# Patient Record
Sex: Female | Born: 1970
Health system: Southern US, Community
[De-identification: ages and names within clinical notes are randomized; demographics above are authoritative.]

## PROBLEM LIST (undated history)

## (undated) DIAGNOSIS — Z923 Personal history of irradiation: Secondary | ICD-10-CM

## (undated) DIAGNOSIS — C50919 Malignant neoplasm of unspecified site of unspecified female breast: Secondary | ICD-10-CM

## (undated) DIAGNOSIS — R002 Palpitations: Secondary | ICD-10-CM

## (undated) DIAGNOSIS — J189 Pneumonia, unspecified organism: Secondary | ICD-10-CM

## (undated) DIAGNOSIS — M797 Fibromyalgia: Secondary | ICD-10-CM

## (undated) DIAGNOSIS — R232 Flushing: Secondary | ICD-10-CM

## (undated) DIAGNOSIS — D259 Leiomyoma of uterus, unspecified: Secondary | ICD-10-CM

## (undated) DIAGNOSIS — I499 Cardiac arrhythmia, unspecified: Secondary | ICD-10-CM

## (undated) DIAGNOSIS — I471 Supraventricular tachycardia, unspecified: Secondary | ICD-10-CM

## (undated) DIAGNOSIS — A159 Respiratory tuberculosis unspecified: Secondary | ICD-10-CM

## (undated) DIAGNOSIS — F419 Anxiety disorder, unspecified: Secondary | ICD-10-CM

## (undated) DIAGNOSIS — K219 Gastro-esophageal reflux disease without esophagitis: Secondary | ICD-10-CM

## (undated) DIAGNOSIS — C50411 Malignant neoplasm of upper-outer quadrant of right female breast: Secondary | ICD-10-CM

## (undated) HISTORY — DX: Supraventricular tachycardia: I47.1

## (undated) HISTORY — PX: ABDOMINAL HYSTERECTOMY: SHX81

## (undated) HISTORY — PX: WISDOM TOOTH EXTRACTION: SHX21

## (undated) HISTORY — DX: Supraventricular tachycardia, unspecified: I47.10

## (undated) HISTORY — DX: Malignant neoplasm of upper-outer quadrant of right female breast: C50.411

## (undated) HISTORY — DX: Flushing: R23.2

## (undated) HISTORY — DX: Palpitations: R00.2

## (undated) HISTORY — DX: Anxiety disorder, unspecified: F41.9

## (undated) HISTORY — DX: Pneumonia, unspecified organism: J18.9

## (undated) HISTORY — PX: BREAST LUMPECTOMY: SHX2

## (undated) NOTE — *Deleted (*Deleted)
Patient is a 52 year old female with a history of asthma, palpitations with an implantable loop recorder, breast cancer who has been vaccinated against Covid presenting with left upper back pain.  Pain is pleuritic in nature and persistent for the last 3 days.  It does not improve with over-the-counter medications or her asthma pump.  She has had a nonproductive cough some nasal drainage but denies any fever or chills.  She has had no vomiting or diarrhea.  On exam patient is well-appearing and vital signs are reassuring.  Breath sounds are clear bilaterally however concern for possible pneumonia versus PE versus pleurisy.  Low suspicion for ACS or dissection.  Low suspicion for acute abdominal pathology.  EKG without acute changes.

---

## 1991-09-26 HISTORY — PX: TUBAL LIGATION: SHX77

## 2006-01-25 ENCOUNTER — Encounter: Admission: RE | Admit: 2006-01-25 | Discharge: 2006-01-25 | Payer: Self-pay | Admitting: Family Medicine

## 2006-07-18 DIAGNOSIS — L708 Other acne: Secondary | ICD-10-CM

## 2006-07-18 DIAGNOSIS — N951 Menopausal and female climacteric states: Secondary | ICD-10-CM | POA: Insufficient documentation

## 2006-07-18 DIAGNOSIS — K219 Gastro-esophageal reflux disease without esophagitis: Secondary | ICD-10-CM

## 2006-07-27 ENCOUNTER — Emergency Department (HOSPITAL_COMMUNITY): Admission: EM | Admit: 2006-07-27 | Discharge: 2006-07-27 | Payer: Self-pay | Admitting: Emergency Medicine

## 2006-08-21 ENCOUNTER — Encounter (INDEPENDENT_AMBULATORY_CARE_PROVIDER_SITE_OTHER): Payer: Self-pay | Admitting: Nurse Practitioner

## 2006-11-02 ENCOUNTER — Emergency Department (HOSPITAL_COMMUNITY): Admission: EM | Admit: 2006-11-02 | Discharge: 2006-11-02 | Payer: Self-pay | Admitting: Emergency Medicine

## 2007-01-25 ENCOUNTER — Emergency Department (HOSPITAL_COMMUNITY): Admission: EM | Admit: 2007-01-25 | Discharge: 2007-01-25 | Payer: Self-pay | Admitting: Emergency Medicine

## 2007-04-22 ENCOUNTER — Emergency Department (HOSPITAL_COMMUNITY): Admission: EM | Admit: 2007-04-22 | Discharge: 2007-04-22 | Payer: Self-pay | Admitting: Emergency Medicine

## 2007-05-24 ENCOUNTER — Emergency Department (HOSPITAL_COMMUNITY): Admission: EM | Admit: 2007-05-24 | Discharge: 2007-05-25 | Payer: Self-pay | Admitting: Emergency Medicine

## 2007-06-06 DIAGNOSIS — M545 Low back pain: Secondary | ICD-10-CM

## 2007-07-20 ENCOUNTER — Emergency Department (HOSPITAL_COMMUNITY): Admission: EM | Admit: 2007-07-20 | Discharge: 2007-07-20 | Payer: Self-pay | Admitting: Emergency Medicine

## 2007-07-31 DIAGNOSIS — G43909 Migraine, unspecified, not intractable, without status migrainosus: Secondary | ICD-10-CM | POA: Insufficient documentation

## 2007-09-04 ENCOUNTER — Emergency Department (HOSPITAL_COMMUNITY): Admission: EM | Admit: 2007-09-04 | Discharge: 2007-09-04 | Payer: Self-pay | Admitting: Emergency Medicine

## 2007-10-20 ENCOUNTER — Emergency Department (HOSPITAL_COMMUNITY): Admission: EM | Admit: 2007-10-20 | Discharge: 2007-10-20 | Payer: Self-pay | Admitting: Emergency Medicine

## 2007-12-28 ENCOUNTER — Emergency Department (HOSPITAL_COMMUNITY): Admission: EM | Admit: 2007-12-28 | Discharge: 2007-12-28 | Payer: Self-pay | Admitting: Emergency Medicine

## 2008-01-09 ENCOUNTER — Encounter (INDEPENDENT_AMBULATORY_CARE_PROVIDER_SITE_OTHER): Payer: Self-pay | Admitting: Nurse Practitioner

## 2008-01-21 ENCOUNTER — Encounter (INDEPENDENT_AMBULATORY_CARE_PROVIDER_SITE_OTHER): Payer: Self-pay | Admitting: Nurse Practitioner

## 2008-01-27 ENCOUNTER — Ambulatory Visit: Payer: Self-pay | Admitting: Nurse Practitioner

## 2008-01-27 DIAGNOSIS — M549 Dorsalgia, unspecified: Secondary | ICD-10-CM | POA: Insufficient documentation

## 2008-01-30 ENCOUNTER — Encounter (INDEPENDENT_AMBULATORY_CARE_PROVIDER_SITE_OTHER): Payer: Self-pay | Admitting: Family Medicine

## 2008-01-30 ENCOUNTER — Ambulatory Visit (HOSPITAL_COMMUNITY): Admission: RE | Admit: 2008-01-30 | Discharge: 2008-01-30 | Payer: Self-pay | Admitting: Nurse Practitioner

## 2008-02-04 ENCOUNTER — Encounter (INDEPENDENT_AMBULATORY_CARE_PROVIDER_SITE_OTHER): Payer: Self-pay | Admitting: Nurse Practitioner

## 2008-02-13 ENCOUNTER — Encounter (INDEPENDENT_AMBULATORY_CARE_PROVIDER_SITE_OTHER): Payer: Self-pay | Admitting: Nurse Practitioner

## 2008-02-24 ENCOUNTER — Encounter (INDEPENDENT_AMBULATORY_CARE_PROVIDER_SITE_OTHER): Payer: Self-pay | Admitting: Nurse Practitioner

## 2008-03-11 ENCOUNTER — Emergency Department (HOSPITAL_COMMUNITY): Admission: EM | Admit: 2008-03-11 | Discharge: 2008-03-11 | Payer: Self-pay | Admitting: Emergency Medicine

## 2008-03-23 ENCOUNTER — Telehealth (INDEPENDENT_AMBULATORY_CARE_PROVIDER_SITE_OTHER): Payer: Self-pay | Admitting: Nurse Practitioner

## 2008-03-30 ENCOUNTER — Other Ambulatory Visit: Admission: RE | Admit: 2008-03-30 | Discharge: 2008-03-30 | Payer: Self-pay | Admitting: Family Medicine

## 2008-03-30 ENCOUNTER — Ambulatory Visit: Payer: Self-pay | Admitting: Nurse Practitioner

## 2008-03-30 LAB — CONVERTED CEMR LAB
ALT: 17 units/L (ref 0–35)
AST: 18 units/L (ref 0–37)
Albumin: 4.5 g/dL (ref 3.5–5.2)
Alkaline Phosphatase: 64 units/L (ref 39–117)
BUN: 10 mg/dL (ref 6–23)
Basophils Absolute: 0 10*3/uL (ref 0.0–0.1)
Basophils Relative: 0 % (ref 0–1)
Chloride: 105 meq/L (ref 96–112)
Cholesterol: 184 mg/dL (ref 0–200)
Eosinophils Absolute: 0.1 10*3/uL (ref 0.0–0.7)
Glucose, Urine, Semiquant: NEGATIVE
HCT: 40.3 % (ref 36.0–46.0)
Hemoglobin: 12.8 g/dL (ref 12.0–15.0)
KOH Prep: NEGATIVE
MCV: 83.8 fL (ref 78.0–100.0)
Monocytes Absolute: 0.5 10*3/uL (ref 0.1–1.0)
Monocytes Relative: 7 % (ref 3–12)
Neutro Abs: 2.1 10*3/uL (ref 1.7–7.7)
Neutrophils Relative %: 33 % — ABNORMAL LOW (ref 43–77)
Nitrite: NEGATIVE
Potassium: 4.1 meq/L (ref 3.5–5.3)
RDW: 15 % (ref 11.5–15.5)
Sodium: 140 meq/L (ref 135–145)
Specific Gravity, Urine: >=1.03
Total Protein: 7.4 g/dL (ref 6.0–8.3)
Triglycerides: 84 mg/dL (ref <150)
WBC: 6.6 10*3/uL (ref 4.0–10.5)

## 2008-03-31 ENCOUNTER — Encounter (INDEPENDENT_AMBULATORY_CARE_PROVIDER_SITE_OTHER): Payer: Self-pay | Admitting: Nurse Practitioner

## 2008-03-31 LAB — CONVERTED CEMR LAB: Pap Smear: NEGATIVE

## 2008-04-03 ENCOUNTER — Ambulatory Visit (HOSPITAL_COMMUNITY): Admission: RE | Admit: 2008-04-03 | Discharge: 2008-04-03 | Payer: Self-pay | Admitting: Family Medicine

## 2008-04-10 ENCOUNTER — Encounter: Admission: RE | Admit: 2008-04-10 | Discharge: 2008-04-10 | Payer: Self-pay | Admitting: Family Medicine

## 2008-04-10 ENCOUNTER — Encounter (INDEPENDENT_AMBULATORY_CARE_PROVIDER_SITE_OTHER): Payer: Self-pay | Admitting: Nurse Practitioner

## 2008-04-16 ENCOUNTER — Ambulatory Visit (HOSPITAL_COMMUNITY): Admission: RE | Admit: 2008-04-16 | Discharge: 2008-04-16 | Payer: Self-pay | Admitting: Orthopaedic Surgery

## 2008-04-20 ENCOUNTER — Encounter (INDEPENDENT_AMBULATORY_CARE_PROVIDER_SITE_OTHER): Payer: Self-pay | Admitting: Nurse Practitioner

## 2008-05-07 ENCOUNTER — Encounter (INDEPENDENT_AMBULATORY_CARE_PROVIDER_SITE_OTHER): Payer: Self-pay | Admitting: Nurse Practitioner

## 2008-05-10 ENCOUNTER — Emergency Department (HOSPITAL_COMMUNITY): Admission: EM | Admit: 2008-05-10 | Discharge: 2008-05-10 | Payer: Self-pay | Admitting: Emergency Medicine

## 2008-06-03 ENCOUNTER — Telehealth (INDEPENDENT_AMBULATORY_CARE_PROVIDER_SITE_OTHER): Payer: Self-pay | Admitting: *Deleted

## 2008-06-11 ENCOUNTER — Ambulatory Visit: Payer: Self-pay | Admitting: Nurse Practitioner

## 2008-06-11 DIAGNOSIS — IMO0002 Reserved for concepts with insufficient information to code with codable children: Secondary | ICD-10-CM

## 2008-06-16 ENCOUNTER — Inpatient Hospital Stay (HOSPITAL_COMMUNITY): Admission: AD | Admit: 2008-06-16 | Discharge: 2008-06-16 | Payer: Self-pay | Admitting: Obstetrics & Gynecology

## 2008-10-12 ENCOUNTER — Telehealth (INDEPENDENT_AMBULATORY_CARE_PROVIDER_SITE_OTHER): Payer: Self-pay | Admitting: Nurse Practitioner

## 2008-10-12 ENCOUNTER — Encounter (INDEPENDENT_AMBULATORY_CARE_PROVIDER_SITE_OTHER): Payer: Self-pay | Admitting: Nurse Practitioner

## 2008-10-27 ENCOUNTER — Ambulatory Visit: Payer: Self-pay | Admitting: Nurse Practitioner

## 2008-10-27 DIAGNOSIS — H9190 Unspecified hearing loss, unspecified ear: Secondary | ICD-10-CM | POA: Insufficient documentation

## 2008-11-04 ENCOUNTER — Telehealth (INDEPENDENT_AMBULATORY_CARE_PROVIDER_SITE_OTHER): Payer: Self-pay | Admitting: Nurse Practitioner

## 2008-11-28 ENCOUNTER — Emergency Department (HOSPITAL_COMMUNITY): Admission: EM | Admit: 2008-11-28 | Discharge: 2008-11-28 | Payer: Self-pay | Admitting: Emergency Medicine

## 2008-12-09 ENCOUNTER — Encounter (INDEPENDENT_AMBULATORY_CARE_PROVIDER_SITE_OTHER): Payer: Self-pay | Admitting: Nurse Practitioner

## 2009-03-17 ENCOUNTER — Ambulatory Visit: Payer: Self-pay | Admitting: Nurse Practitioner

## 2009-03-17 DIAGNOSIS — F17200 Nicotine dependence, unspecified, uncomplicated: Secondary | ICD-10-CM

## 2009-03-26 ENCOUNTER — Encounter (INDEPENDENT_AMBULATORY_CARE_PROVIDER_SITE_OTHER): Payer: Self-pay | Admitting: Nurse Practitioner

## 2009-04-01 ENCOUNTER — Encounter: Admission: RE | Admit: 2009-04-01 | Discharge: 2009-05-18 | Payer: Self-pay | Admitting: Internal Medicine

## 2009-04-05 ENCOUNTER — Encounter (INDEPENDENT_AMBULATORY_CARE_PROVIDER_SITE_OTHER): Payer: Self-pay | Admitting: Nurse Practitioner

## 2009-04-05 ENCOUNTER — Telehealth (INDEPENDENT_AMBULATORY_CARE_PROVIDER_SITE_OTHER): Payer: Self-pay | Admitting: Nurse Practitioner

## 2009-04-09 ENCOUNTER — Ambulatory Visit: Payer: Self-pay | Admitting: Nurse Practitioner

## 2009-04-09 ENCOUNTER — Other Ambulatory Visit: Admission: RE | Admit: 2009-04-09 | Discharge: 2009-04-09 | Payer: Self-pay | Admitting: Internal Medicine

## 2009-04-09 ENCOUNTER — Encounter (INDEPENDENT_AMBULATORY_CARE_PROVIDER_SITE_OTHER): Payer: Self-pay | Admitting: Nurse Practitioner

## 2009-04-09 DIAGNOSIS — Z9189 Other specified personal risk factors, not elsewhere classified: Secondary | ICD-10-CM | POA: Insufficient documentation

## 2009-04-09 LAB — CONVERTED CEMR LAB
AST: 17 units/L (ref 0–37)
Alkaline Phosphatase: 65 units/L (ref 39–117)
Basophils Absolute: 0 10*3/uL (ref 0.0–0.1)
CO2: 23 meq/L (ref 19–32)
Calcium: 9 mg/dL (ref 8.4–10.5)
Chlamydia, DNA Probe: NEGATIVE
Chloride: 106 meq/L (ref 96–112)
Eosinophils Absolute: 0.1 10*3/uL (ref 0.0–0.7)
Eosinophils Relative: 1 % (ref 0–5)
Glucose, Bld: 64 mg/dL — ABNORMAL LOW (ref 70–99)
HCT: 37.2 % (ref 36.0–46.0)
KOH Prep: NEGATIVE
LDL Cholesterol: 88 mg/dL (ref 0–99)
MCV: 81 fL (ref 78.0–100.0)
Potassium: 4.1 meq/L (ref 3.5–5.3)
RBC: 4.59 M/uL (ref 3.87–5.11)
RDW: 15.2 % (ref 11.5–15.5)
Sodium: 139 meq/L (ref 135–145)
Total Bilirubin: 0.3 mg/dL (ref 0.3–1.2)
Total CHOL/HDL Ratio: 3.1
Total Protein: 6.8 g/dL (ref 6.0–8.3)
Triglycerides: 76 mg/dL (ref <150)
Urobilinogen, UA: 0.2
WBC Urine, dipstick: NEGATIVE
WBC: 6.5 10*3/uL (ref 4.0–10.5)
pH: 7

## 2009-04-14 ENCOUNTER — Ambulatory Visit (HOSPITAL_COMMUNITY): Admission: RE | Admit: 2009-04-14 | Discharge: 2009-04-14 | Payer: Self-pay | Admitting: Internal Medicine

## 2009-04-19 ENCOUNTER — Encounter (INDEPENDENT_AMBULATORY_CARE_PROVIDER_SITE_OTHER): Payer: Self-pay | Admitting: Nurse Practitioner

## 2009-05-10 ENCOUNTER — Ambulatory Visit: Payer: Self-pay | Admitting: Family Medicine

## 2009-05-10 DIAGNOSIS — L738 Other specified follicular disorders: Secondary | ICD-10-CM

## 2009-05-19 ENCOUNTER — Encounter (INDEPENDENT_AMBULATORY_CARE_PROVIDER_SITE_OTHER): Payer: Self-pay | Admitting: Nurse Practitioner

## 2009-05-27 ENCOUNTER — Telehealth (INDEPENDENT_AMBULATORY_CARE_PROVIDER_SITE_OTHER): Payer: Self-pay | Admitting: Nurse Practitioner

## 2009-06-08 ENCOUNTER — Emergency Department (HOSPITAL_COMMUNITY): Admission: EM | Admit: 2009-06-08 | Discharge: 2009-06-08 | Payer: Self-pay | Admitting: Emergency Medicine

## 2009-06-10 ENCOUNTER — Ambulatory Visit: Payer: Self-pay | Admitting: Nurse Practitioner

## 2009-07-15 ENCOUNTER — Emergency Department (HOSPITAL_COMMUNITY): Admission: EM | Admit: 2009-07-15 | Discharge: 2009-07-15 | Payer: Self-pay | Admitting: Emergency Medicine

## 2009-07-21 ENCOUNTER — Ambulatory Visit: Payer: Self-pay | Admitting: Cardiothoracic Surgery

## 2009-07-21 ENCOUNTER — Ambulatory Visit: Payer: Self-pay | Admitting: Infectious Diseases

## 2009-07-21 ENCOUNTER — Inpatient Hospital Stay (HOSPITAL_COMMUNITY): Admission: EM | Admit: 2009-07-21 | Discharge: 2009-07-23 | Payer: Self-pay | Admitting: Emergency Medicine

## 2009-07-21 ENCOUNTER — Encounter (INDEPENDENT_AMBULATORY_CARE_PROVIDER_SITE_OTHER): Payer: Self-pay | Admitting: Internal Medicine

## 2009-07-21 DIAGNOSIS — J13 Pneumonia due to Streptococcus pneumoniae: Secondary | ICD-10-CM | POA: Insufficient documentation

## 2009-07-22 ENCOUNTER — Encounter: Payer: Self-pay | Admitting: Cardiothoracic Surgery

## 2009-08-11 ENCOUNTER — Ambulatory Visit: Payer: Self-pay | Admitting: Nurse Practitioner

## 2009-08-12 ENCOUNTER — Ambulatory Visit (HOSPITAL_COMMUNITY): Admission: RE | Admit: 2009-08-12 | Discharge: 2009-08-12 | Payer: Self-pay | Admitting: Nurse Practitioner

## 2009-08-26 ENCOUNTER — Ambulatory Visit: Payer: Self-pay | Admitting: Infectious Diseases

## 2009-08-26 DIAGNOSIS — R300 Dysuria: Secondary | ICD-10-CM

## 2009-08-26 LAB — CONVERTED CEMR LAB
Bilirubin Urine: NEGATIVE
GC Probe Amp, Urine: NEGATIVE
Protein, ur: NEGATIVE mg/dL
Urobilinogen, UA: 0.2 (ref 0.0–1.0)
pH: 8 (ref 5.0–8.0)

## 2009-09-22 ENCOUNTER — Emergency Department (HOSPITAL_COMMUNITY): Admission: EM | Admit: 2009-09-22 | Discharge: 2009-09-22 | Payer: Self-pay | Admitting: Emergency Medicine

## 2009-10-12 ENCOUNTER — Ambulatory Visit: Payer: Self-pay | Admitting: Nurse Practitioner

## 2009-10-12 DIAGNOSIS — R079 Chest pain, unspecified: Secondary | ICD-10-CM

## 2009-10-12 DIAGNOSIS — R002 Palpitations: Secondary | ICD-10-CM | POA: Insufficient documentation

## 2009-10-12 LAB — CONVERTED CEMR LAB
Bilirubin Urine: NEGATIVE
Specific Gravity, Urine: 1.025
pH: 5.5

## 2009-10-13 ENCOUNTER — Encounter (INDEPENDENT_AMBULATORY_CARE_PROVIDER_SITE_OTHER): Payer: Self-pay | Admitting: Nurse Practitioner

## 2009-10-29 ENCOUNTER — Ambulatory Visit: Payer: Self-pay | Admitting: Nurse Practitioner

## 2009-11-04 ENCOUNTER — Ambulatory Visit (HOSPITAL_COMMUNITY): Admission: RE | Admit: 2009-11-04 | Discharge: 2009-11-04 | Payer: Self-pay | Admitting: Family Medicine

## 2009-11-08 ENCOUNTER — Telehealth (INDEPENDENT_AMBULATORY_CARE_PROVIDER_SITE_OTHER): Payer: Self-pay | Admitting: Nurse Practitioner

## 2009-11-12 ENCOUNTER — Encounter (INDEPENDENT_AMBULATORY_CARE_PROVIDER_SITE_OTHER): Payer: Self-pay | Admitting: Nurse Practitioner

## 2009-11-17 ENCOUNTER — Ambulatory Visit: Payer: Self-pay | Admitting: Nurse Practitioner

## 2009-11-17 DIAGNOSIS — F4322 Adjustment disorder with anxiety: Secondary | ICD-10-CM

## 2009-11-17 LAB — CONVERTED CEMR LAB
Bilirubin Urine: NEGATIVE
Blood in Urine, dipstick: NEGATIVE
Glucose, Urine, Semiquant: NEGATIVE
Protein, U semiquant: 30
WBC Urine, dipstick: NEGATIVE
pH: 6.5

## 2009-11-18 ENCOUNTER — Encounter (INDEPENDENT_AMBULATORY_CARE_PROVIDER_SITE_OTHER): Payer: Self-pay | Admitting: Nurse Practitioner

## 2009-12-15 ENCOUNTER — Ambulatory Visit: Payer: Self-pay | Admitting: Nurse Practitioner

## 2009-12-15 DIAGNOSIS — L299 Pruritus, unspecified: Secondary | ICD-10-CM | POA: Insufficient documentation

## 2010-01-11 ENCOUNTER — Emergency Department (HOSPITAL_COMMUNITY): Admission: EM | Admit: 2010-01-11 | Discharge: 2010-01-11 | Payer: Self-pay | Admitting: Emergency Medicine

## 2010-02-23 ENCOUNTER — Emergency Department (HOSPITAL_COMMUNITY): Admission: EM | Admit: 2010-02-23 | Discharge: 2010-02-23 | Payer: Self-pay | Admitting: Emergency Medicine

## 2010-03-21 ENCOUNTER — Ambulatory Visit: Payer: Self-pay | Admitting: Nurse Practitioner

## 2010-04-29 ENCOUNTER — Encounter (INDEPENDENT_AMBULATORY_CARE_PROVIDER_SITE_OTHER): Payer: Self-pay | Admitting: Nurse Practitioner

## 2010-05-02 ENCOUNTER — Telehealth (INDEPENDENT_AMBULATORY_CARE_PROVIDER_SITE_OTHER): Payer: Self-pay | Admitting: Nurse Practitioner

## 2010-05-05 ENCOUNTER — Ambulatory Visit: Payer: Self-pay | Admitting: Nurse Practitioner

## 2010-07-04 ENCOUNTER — Telehealth (INDEPENDENT_AMBULATORY_CARE_PROVIDER_SITE_OTHER): Payer: Self-pay | Admitting: Nurse Practitioner

## 2010-07-04 ENCOUNTER — Ambulatory Visit: Payer: Self-pay | Admitting: Nurse Practitioner

## 2010-07-04 DIAGNOSIS — L659 Nonscarring hair loss, unspecified: Secondary | ICD-10-CM | POA: Insufficient documentation

## 2010-07-04 LAB — CONVERTED CEMR LAB
Albumin: 4.6 g/dL (ref 3.5–5.2)
Anti Nuclear Antibody(ANA): NEGATIVE
Basophils Absolute: 0 10*3/uL (ref 0.0–0.1)
CRP: 0.2 mg/dL (ref <0.6)
Creatinine, Ser: 0.7 mg/dL (ref 0.40–1.20)
Eosinophils Absolute: 0.1 10*3/uL (ref 0.0–0.7)
HCT: 38.8 % (ref 36.0–46.0)
Hemoglobin: 12.5 g/dL (ref 12.0–15.0)
Lymphocytes Relative: 48 % — ABNORMAL HIGH (ref 12–46)
Lymphs Abs: 3.5 10*3/uL (ref 0.7–4.0)
Monocytes Absolute: 0.4 10*3/uL (ref 0.1–1.0)
Monocytes Relative: 5 % (ref 3–12)
Neutro Abs: 3.3 10*3/uL (ref 1.7–7.7)
Neutrophils Relative %: 45 % (ref 43–77)
Platelets: 237 10*3/uL (ref 150–400)
RBC: 4.74 M/uL (ref 3.87–5.11)
RDW: 15 % (ref 11.5–15.5)
Sodium: 141 meq/L (ref 135–145)
Total Bilirubin: 0.3 mg/dL (ref 0.3–1.2)

## 2010-07-13 ENCOUNTER — Encounter (INDEPENDENT_AMBULATORY_CARE_PROVIDER_SITE_OTHER): Payer: Self-pay | Admitting: Nurse Practitioner

## 2010-07-15 ENCOUNTER — Encounter (INDEPENDENT_AMBULATORY_CARE_PROVIDER_SITE_OTHER): Payer: Self-pay | Admitting: Nurse Practitioner

## 2010-07-18 ENCOUNTER — Encounter: Payer: Self-pay | Admitting: Internal Medicine

## 2010-07-18 ENCOUNTER — Ambulatory Visit: Payer: Self-pay | Admitting: Nurse Practitioner

## 2010-07-18 DIAGNOSIS — J302 Other seasonal allergic rhinitis: Secondary | ICD-10-CM

## 2010-07-18 DIAGNOSIS — J3089 Other allergic rhinitis: Secondary | ICD-10-CM

## 2010-07-21 ENCOUNTER — Ambulatory Visit: Payer: Self-pay | Admitting: Internal Medicine

## 2010-07-22 LAB — CONVERTED CEMR LAB
Cholesterol: 142 mg/dL (ref 0–200)
HDL: 48.9 mg/dL (ref >39.00)
LDL Cholesterol: 72 mg/dL (ref 0–99)

## 2010-07-26 ENCOUNTER — Ambulatory Visit: Payer: Self-pay | Admitting: Internal Medicine

## 2010-07-26 ENCOUNTER — Telehealth (INDEPENDENT_AMBULATORY_CARE_PROVIDER_SITE_OTHER): Payer: Self-pay | Admitting: *Deleted

## 2010-08-02 ENCOUNTER — Encounter: Payer: Self-pay | Admitting: Internal Medicine

## 2010-08-14 ENCOUNTER — Emergency Department (HOSPITAL_COMMUNITY): Admission: EM | Admit: 2010-08-14 | Discharge: 2010-08-14 | Payer: Self-pay | Admitting: Emergency Medicine

## 2010-08-24 ENCOUNTER — Telehealth: Payer: Self-pay | Admitting: Internal Medicine

## 2010-08-29 ENCOUNTER — Ambulatory Visit: Payer: Self-pay | Admitting: Internal Medicine

## 2010-09-15 ENCOUNTER — Encounter: Payer: Self-pay | Admitting: Internal Medicine

## 2010-10-07 ENCOUNTER — Encounter: Payer: Self-pay | Admitting: Internal Medicine

## 2010-10-07 ENCOUNTER — Ambulatory Visit
Admission: RE | Admit: 2010-10-07 | Discharge: 2010-10-07 | Payer: Self-pay | Source: Home / Self Care | Attending: Internal Medicine | Admitting: Internal Medicine

## 2010-10-16 ENCOUNTER — Encounter: Payer: Self-pay | Admitting: Obstetrics

## 2010-10-17 ENCOUNTER — Encounter: Payer: Self-pay | Admitting: Family Medicine

## 2010-10-24 ENCOUNTER — Ambulatory Visit: Admit: 2010-10-24 | Payer: Self-pay | Admitting: Nurse Practitioner

## 2010-10-25 NOTE — Assessment & Plan Note (Signed)
Summary: np6/chest pain   Visit Type:  new pt visit Primary Mikelle Myrick:  Lehman Prom FNP  CC:  chest pain on and off for the last month....sob....rapid heart beat at times..pt states she does not move around too much anymore because of this.....  History of Present Illness: patient is a 40 year old who was referred for evaluation of chest pain and palpitations. The patient said about 1 year ago she began having palpitaitons.  Episodes start abruptly.  they last anywhere from 30 min to 1 to 2 hours.  Accomp by weakness, dizziness.  No syncope.  One time had numbness on one side of body.  End slowly. The patient also notes episodes of chest pressure.  Occur usually when her heart is racing.   She alson notes some SOB  Again, mainly when her heart is racing.  Occas when heart is not racing. She is very active otherwise.  Current Medications (verified): 1)  Dexilant 60 Mg Cpdr (Dexlansoprazole) .... One Tablet By Mouth Daily Before Breakfast For Stomach **pharmacy D/c Nexium** 2)  Diclofenac Sodium 75 Mg Tbec (Diclofenac Sodium) .... One Tablet By Mouth Two Times A Day For Back 3)  Ventolin Hfa 108 (90 Base) Mcg/act Aers (Albuterol Sulfate) .... 2 Puffs Every 6 Hours As Neeeded For Shortness of Breath 4)  Venlafaxine Hcl 150 Mg Xr24h-Cap (Venlafaxine Hcl) .... One Capsule By Mouth Daily  **note Change in Dose** 5)  Ketoconazole 2 % Sham (Ketoconazole) .... One Applicationto Scalp Three Times Per Week 6)  Loratadine 10 Mg Tabs (Loratadine) .... One Tablet By Mouth Daily For Allergies 7)  Nexium 40 Mg Cpdr (Esomeprazole Magnesium) .Marland Kitchen.. 1 Cap Once Daily  Allergies: 1)  ! Morphine  Past History:  Past medical, surgical, family and social histories (including risk factors) reviewed, and no changes noted (except as noted below).  Past Medical History: Reviewed history from 08/26/2009 and no changes required. 1. Pneumonia with cavitation of left lower lobe.   2. Asthma.   3. Migraine.   4.  Hot flashes.   Past Surgical History: Reviewed history from 01/27/2008 and no changes required. Tubal ligation 1993  Family History: Reviewed history from 01/27/2008 and no changes required. daughter and son with headaches Family History Hypertension - htn  Social History: Reviewed history from 08/26/2009 and no changes required. Single G2P2 - natural births Prior Smoker - cigarette - quit last month Alcohol use-no Drug use-no  Review of Systems       All system reviewed. Negative to the above problem.  Vital Signs:  Patient profile:   40 year old female Menstrual status:  regular Height:      64.50 inches Weight:      162.8 pounds BMI:     27.61 Pulse rate:   66 / minute Pulse rhythm:   irregular BP sitting:   108 / 72  (left arm) Cuff size:   large  Vitals Entered By: Danielle Rankin, CMA (July 21, 2010 3:38 PM)  Physical Exam  Additional Exam:  patient is in NAD HEENT:  Normocephalic, atraumatic. EOMI, PERRLA.  Neck: JVP is normal. No thyromegaly. No bruits.  Lungs: clear to auscultation. No rales no wheezes.  Heart: Regular rate and rhythm. Normal S1, S2. No S3.   No significant murmurs. PMI not displaced.  Abdomen:  Supple, nontender. Normal bowel sounds. No masses. No hepatomegaly.  Extremities:   Good distal pulses throughout. No lower extremity edema.  Musculoskeletal :moving all extremities.  Neuro:   alert and oriented  x3.    EKG  Procedure date:  07/21/2010  Findings:      NSR.  66 bpm.  Impression & Recommendations:  Problem # 1:  PALPITATIONS (ICD-785.1) COncerning for a reentry arrhtyhmia.  I would check TSH.  Set patient up for event monitor.  Problem # 2:  CHEST PAIN, LEFT (ICD-786.50) I would follow.  Most episodes appear  to occur when her heart is racing.  I do not feel they represent ischemia.  Other Orders: EKG w/ Interpretation (93000) TLB-Lipid Panel (80061-LIPID) TLB-TSH (Thyroid Stimulating Hormone) (84443-TSH) Event  (Event)  Patient Instructions: 1)  Your physician recommends that you return for lab work in: lab work today...we will call you with results 2)  Your physician has recommended that you wear an event monitor.  Event monitors are medical devices that record the heart's electrical activity. Doctors most often use these monitors to diagnose arrhythmias. Arrhythmias are problems with the speed or rhythm of the heartbeat. The monitor is a small, portable device. You can wear one while you do your normal daily activities. This is usually used to diagnose what is causing palpitations/syncope (passing out).

## 2010-10-25 NOTE — Assessment & Plan Note (Signed)
Summary: F/u Med Review   Vital Signs:  Patient profile:   40 year old female Menstrual status:  regular LMP:     11/2009 Weight:      159.4 pounds BMI:     27.04 BSA:     1.79 Temp:     97.9 degrees F oral Pulse rate:   69 / minute Pulse rhythm:   regular Resp:     16 per minute BP sitting:   125 / 77  (left arm) Cuff size:   regular  Vitals Entered By: Levon Hedger (December 15, 2009 10:34 AM) CC: follow up visit...rash and itching in ears and head Is Patient Diabetic? No Pain Assessment Patient in pain? yes     Location: neck, back Intensity: 8  Does patient need assistance? Functional Status Self care Ambulation Normal LMP (date): 11/2009 LMP - Character: heavy     Enter LMP: 11/2009 Last PAP Result  Specimen Adequacy: Satisfactory for evaluation.   Interpretation/Result:Negative for intraepithelial Lesion or Malignancy.      Primary Care Provider:  Lehman Prom FNP  CC:  follow up visit...rash and itching in ears and head.  History of Present Illness:  Pt into the office for med review. Pt was started on celexa for her anxiety.  "It only makes me sleepy" She takes the medication about 6pm but reports that she is still sleepy during the next day. Initially some nausea with the medication but that is improving. The medications were started in an effort to improve her anxiety.  Pustules on arms, face and legs, scalp -  Symptoms started 2 -3 days ago +puritic The lesions on her face sometimes rupture and clear fluid is expressed (pt has a history of acne) Reports that her arms start with bruises that occur without trauma.   Pt puts alcohol on the affected area and they spontaneously resolves. Today arms have improved but took a full day before it went away.  Denies any new triggers +puritic Face with 2 lesions currently. No pets at home, she did have cats but over 6 months ago but due to her daughter having allergies she got rid of them " I feel lke  something is crawling under my skin" She just got Brand new mattress  Allergies (verified): 1)  ! Morphine  Review of Systems General:  celexa is making pt sleepy. CV:  Denies chest pain or discomfort. Resp:  Denies cough. GI:  Complains of nausea; denies vomiting; improved as she has continued to take the celexa. MS:  Complains of low back pain. Derm:  Complains of itching and rash.  Physical Exam  General:  alert.   Head:  normocephalic.   Eyes:  glasses Lungs:  normal breath sounds.   Heart:  normal rate and regular rhythm.   Neurologic:  alert & oriented X3.   Skin:  face with several papules - crusted lesions no visible lesions on arms right hand - interdigital 4th finger with papule Psych:  Oriented X3.     Impression & Recommendations:  Problem # 1:  ANXIETY DISORDER, SITUATIONAL, MILD (ICD-309.24) continue the celexa  Problem # 2:  ACNE VULGARIS (ICD-706.1)  Her updated medication list for this problem includes:    Clindamycin Phosphate 1 % Gel (Clindamycin phosphate) .Marland Kitchen... Apply to face two times a day - let sit for 5 minutes then rinse off with clear water  Problem # 3:  PRURITUS (ICD-698.9) unsure of cause will have pt evaluate triggers vistaril as needed  Complete Medication List: 1)  Nexium 40 Mg Cpdr (Esomeprazole magnesium) .... One tablet by mouth daily for stomach 2)  Diclofenac Sodium 75 Mg Tbec (Diclofenac sodium) .... One tablet by mouth two times a day for back 3)  Black Cohosh Hot Flash Relief 40 Mg Caps (Black cohosh) .Marland Kitchen.. 1 tablet by mouth two times a day 4)  Evening Primrose Oil (Evening primrose) .Marland Kitchen.. 1 tablet by mouth by mouth two times a day 5)  Ventolin Hfa 108 (90 Base) Mcg/act Aers (Albuterol sulfate) .... 2 puffs every 6 hours as neeeded for shortness of breath 6)  Citalopram Hydrobromide 20 Mg Tabs (Citalopram hydrobromide) .... One tablet by mouth daily for nerves 7)  Clindamycin Phosphate 1 % Gel (Clindamycin phosphate) .... Apply  to face two times a day - let sit for 5 minutes then rinse off with clear water 8)  Vistaril 25 Mg Caps (Hydroxyzine pamoate) .... One tablet by mouth daily as needed for itching  Patient Instructions: 1)  Follow up as needed Prescriptions: VISTARIL 25 MG CAPS (HYDROXYZINE PAMOATE) One tablet by mouth daily as needed for itching  #30 x 0   Entered and Authorized by:   Lehman Prom FNP   Signed by:   Lehman Prom FNP on 12/15/2009   Method used:   Print then Give to Patient   RxID:   [id number] CLINDAMYCIN PHOSPHATE 1 % GEL (CLINDAMYCIN PHOSPHATE) Apply to face two times a day - let sit for 5 minutes then rinse off with clear water  #60gm x 0   Entered and Authorized by:   Lehman Prom FNP   Signed by:   Lehman Prom FNP on 12/15/2009   Method used:   Print then Give to Patient   RxID:   [id number]

## 2010-10-25 NOTE — Letter (Signed)
Summary: PT  DEMOGRAPHICS  PT  DEMOGRAPHICS   Imported By: Arta Bruce 01/10/2010 14:59:33  _____________________________________________________________________  External Attachment:    Type:   Image     Comment:   External Document

## 2010-10-25 NOTE — Letter (Signed)
Summary: *Referral Letter  [facility]  [address] [place], [place] [address]   Phone: [phone]  Fax: [phone]    05/05/2010  K[name] [address] [place], [place]  [address]  Phone: [phone]  To whom it may concern:  See below for the below medical problems.  Of note is that she has back pain which is exacerbated by standing and walking for extended periods of time.  Also walking up and down the steps makes back pain worse.  It is for this reason that she is requesting a one level apartment.  Current Medical Problems: 1)  PRURITUS (ICD-698.9) 2)  ANXIETY DISORDER, SITUATIONAL, MILD (ICD-309.24) 3)  PALPITATIONS (ICD-785.1) 4)  CHEST PAIN, LEFT (ICD-786.50) 5)  DYSURIA (ICD-788.1) 6)  Hx of PNEUMONIA, LEFT LOWER LOBE (ICD-481) 7)  FOLLICULITIS (ICD-704.8) 8)  Hx of TOBACCO ABUSE (ICD-305.1) 9)  DECREASED HEARING, RIGHT EAR (ICD-389.9) 10)  MUSCLE STRAIN, ABDOMINAL WALL (ICD-848.8) 11)  BACK PAIN, LUMBAR (ICD-724.2) 12)  HOT FLASHES (ICD-627.2) 13)  GERD (ICD-530.81) 14)  ACNE VULGARIS (ICD-706.1) 15)  MIGRAINE HEADACHE (ICD-346.90)   Current Medications: 1)  NEXIUM 40 MG CPDR (ESOMEPRAZOLE MAGNESIUM) One tablet by mouth daily for stomach 2)  DICLOFENAC SODIUM 75 MG TBEC (DICLOFENAC SODIUM) One tablet by mouth two times a day for back 3)  BLACK COHOSH HOT FLASH RELIEF 40 MG CAPS (BLACK COHOSH) 1 tablet by mouth two times a day 4)  EVENING PRIMROSE  OIL (EVENING PRIMROSE) 1 tablet by mouth by mouth two times a day 5)  VENTOLIN HFA 108 (90 BASE) MCG/ACT AERS (ALBUTEROL SULFATE) 2 puffs every 6 hours as neeeded for shortness of breath 6)  CLINDAMYCIN PHOSPHATE 1 % GEL (CLINDAMYCIN PHOSPHATE) Apply to face two times a day - let sit for 5 minutes then rinse off with clear water 7)  VISTARIL 25 MG CAPS (HYDROXYZINE PAMOATE) One tablet by mouth daily as needed for itching 8)  VENLAFAXINE HCL 75 MG XR24H-CAP (VENLAFAXINE HCL) One tablet by  mouth daily for anxiety 9)  KETOCONAZOLE 2 % SHAM (KETOCONAZOLE) One applicationto scalp three times per week       Please contact us if you have any further questions or need additional information.  Sincerely,   Lehman Prom FNP [facility]

## 2010-10-25 NOTE — Letter (Signed)
Summary: Handout Printed  Printed Handout:  - Folliculitis 

## 2010-10-25 NOTE — Assessment & Plan Note (Signed)
Summary: Hair loss   Vital Signs:  Patient profile:   40 year old female Menstrual status:  regular Weight:      159.0 pounds BMI:     26.97 Temp:     97.9 degrees F oral Pulse rate:   72 / minute Pulse rhythm:   regular Resp:     16 per minute BP sitting:   110 / 76  (left arm) Cuff size:   regular  Vitals Entered By: Levon Hedger (July 04, 2010 11:17 AM)  Nutrition Counseling: Patient's BMI is greater than 25 and therefore counseled on weight management options. CC: still having heart palpitation. they are more rapid and she is sleeping alot...pt is experiencing hair loss.two medications that she is taking are known to cause hair loss, Abdominal Pain Is Patient Diabetic? No Pain Assessment Patient in pain? no       Does patient need assistance? Functional Status Self care Ambulation Normal Comments pt brought medication with her today.   Primary Care Provider:  Lehman Prom FNP  CC:  still having heart palpitation. they are more rapid and she is sleeping alot...pt is experiencing hair loss.two medications that she is taking are known to cause hair loss and Abdominal Pain.  History of Present Illness:  Pt into the office for f/u on palpitations. Pt is also on a beta blocker which has helped some with the issues. Pt reports that palpitations come and last for about 3 hours. Pain starts midsternal with some radiation into the left upper chest.   Hair loss - ongoing problems for pt. She has been told previously  not to put any chemicals in her hair which she admits that she does not any longer.  Presents today with braids in her hair that were placed by her sister. Daughter also has problems with hair. Pt is on metoprolol and effexor which both have some side effects.  Case worker present with pt during exam   Dyspepsia History:      She has no alarm features of dyspepsia including no history of melena, hematochezia, dysphagia, persistent vomiting, or  involuntary weight loss > 5%.  There is a prior history of GERD.  The patient does not have a prior history of documented ulcer disease.  The dominant symptom is heartburn or acid reflux.  An H-2 blocker medication is not currently being taken.  She has no history of a positive H. Pylori serology.  No previous upper endoscopy has been done.     Medications Prior to Update: 1)  Nexium 40 Mg Cpdr (Esomeprazole Magnesium) .... One Tablet By Mouth Daily For Stomach 2)  Diclofenac Sodium 75 Mg Tbec (Diclofenac Sodium) .... One Tablet By Mouth Two Times A Day For Back 3)  Black Cohosh Hot Flash Relief 40 Mg Caps (Black Cohosh) .Marland Kitchen.. 1 Tablet By Mouth Two Times A Day 4)  Evening Primrose  Oil (Evening Primrose) .Marland Kitchen.. 1 Tablet By Mouth By Mouth Two Times A Day 5)  Ventolin Hfa 108 (90 Base) Mcg/act Aers (Albuterol Sulfate) .... 2 Puffs Every 6 Hours As Neeeded For Shortness of Breath 6)  Clindamycin Phosphate 1 % Gel (Clindamycin Phosphate) .... Apply To Face Two Times A Day - Let Sit For 5 Minutes Then Rinse Off With Clear Water 7)  Vistaril 25 Mg Caps (Hydroxyzine Pamoate) .... One Tablet By Mouth Daily As Needed For Itching 8)  Venlafaxine Hcl 150 Mg Xr24h-Cap (Venlafaxine Hcl) .... One Capsule By Mouth Daily  **note Change in Dose** 9)  Ketoconazole 2 % Sham (Ketoconazole) .... One Applicationto Scalp Three Times Per Week 10)  Metoprolol Tartrate 25 Mg Tabs (Metoprolol Tartrate) .... 1/2 Tablet By Mouth Daily For Palpitations  Current Medications (verified): 1)  Nexium 40 Mg Cpdr (Esomeprazole Magnesium) .... One Tablet By Mouth Daily For Stomach 2)  Diclofenac Sodium 75 Mg Tbec (Diclofenac Sodium) .... One Tablet By Mouth Two Times A Day For Back 3)  Black Cohosh Hot Flash Relief 40 Mg Caps (Black Cohosh) .Marland Kitchen.. 1 Tablet By Mouth Two Times A Day 4)  Evening Primrose  Oil (Evening Primrose) .Marland Kitchen.. 1 Tablet By Mouth By Mouth Two Times A Day 5)  Ventolin Hfa 108 (90 Base) Mcg/act Aers (Albuterol Sulfate)  .... 2 Puffs Every 6 Hours As Neeeded For Shortness of Breath 6)  Clindamycin Phosphate 1 % Gel (Clindamycin Phosphate) .... Apply To Face Two Times A Day - Let Sit For 5 Minutes Then Rinse Off With Clear Water 7)  Vistaril 25 Mg Caps (Hydroxyzine Pamoate) .... One Tablet By Mouth Daily As Needed For Itching 8)  Venlafaxine Hcl 150 Mg Xr24h-Cap (Venlafaxine Hcl) .... One Capsule By Mouth Daily  **note Change in Dose** 9)  Ketoconazole 2 % Sham (Ketoconazole) .... One Applicationto Scalp Three Times Per Week 10)  Metoprolol Tartrate 25 Mg Tabs (Metoprolol Tartrate) .... 1/2 Tablet By Mouth Daily For Palpitations  Allergies (verified): 1)  ! Morphine  Review of Systems CV:  Complains of chest pain or discomfort and palpitations. Resp:  Denies cough. GI:  Denies nausea and vomiting.  Physical Exam  General:  alert.   Head:  loss of hair follicles widespread over top of head Lungs:  normal breath sounds.   Heart:  normal rate and regular rhythm.   Abdomen:  normal bowel sounds.   mid epigastric tenderness Neurologic:  alert & oriented X3.     Impression & Recommendations:  Problem # 1:  HAIR LOSS (ICD-704.00) will check labs to determine cause advise pt to avoid chemicals Orders: T-Comprehensive Metabolic Panel (36644-03474) T-CBC w/Diff (25956-38756) T-ANA Titer and Pattern (43329-51884) TLB-Sedimentation Rate (ESR) (85652-ESR) T-C-Reactive Protein (16606-30160)  Problem # 2:  PALPITATIONS (ICD-785.1)  Her updated medication list for this problem includes:    Metoprolol Tartrate 25 Mg Tabs (Metoprolol tartrate) .Marland Kitchen... 1/2 tablet by mouth daily for palpitations  Orders: Rapid HIV  (10932) T-Syphilis Test (RPR) (35573-22025) T-TSH (42706-23762) EKG w/ Interpretation (93000)  Problem # 3:  ANXIETY DISORDER, SITUATIONAL, MILD (ICD-309.24) continue current meds  Problem # 4:  GERD (ICD-530.81)  Her updated medication list for this problem includes:    Dexilant 60 Mg Cpdr  (Dexlansoprazole) ..... One tablet by mouth daily before breakfast for stomach **pharmacy d/c nexium**  Problem # 5:  NEED PROPHYLACTIC VACCINATION&INOCULATION FLU (ICD-V04.81) given today in office  Complete Medication List: 1)  Dexilant 60 Mg Cpdr (Dexlansoprazole) .... One tablet by mouth daily before breakfast for stomach **pharmacy d/c nexium** 2)  Diclofenac Sodium 75 Mg Tbec (Diclofenac sodium) .... One tablet by mouth two times a day for back 3)  Black Cohosh Hot Flash Relief 40 Mg Caps (Black cohosh) .Marland Kitchen.. 1 tablet by mouth two times a day 4)  Evening Primrose Oil (Evening primrose) .Marland Kitchen.. 1 tablet by mouth by mouth two times a day 5)  Ventolin Hfa 108 (90 Base) Mcg/act Aers (Albuterol sulfate) .... 2 puffs every 6 hours as neeeded for shortness of breath 6)  Vistaril 25 Mg Caps (Hydroxyzine pamoate) .... One tablet by mouth daily as needed for  itching 7)  Venlafaxine Hcl 150 Mg Xr24h-cap (Venlafaxine hcl) .... One capsule by mouth daily  **note change in dose** 8)  Ketoconazole 2 % Sham (Ketoconazole) .... One applicationto scalp three times per week 9)  Metoprolol Tartrate 25 Mg Tabs (Metoprolol tartrate) .... 1/2 tablet by mouth daily for palpitations  Other Orders: Flu Vaccine 107yrs + [id number]) Admin 1st Vaccine (24401) Admin 1st Vaccine [facility]) [phone])  Patient Instructions: 1)  Palpitations - will check labs today. 2)  May be from GERD.  Will change stomach medications until your next visit here 3)  Hair - Will check labs today. 4)  Follow up in 2 weeks for hair loss and palpitations. 5)  Other things to consider is EGD for reflux. 6)  Will also need additional follow up for hair loss.  Keep in mind the medications that have potential side effect of hair loss. 7)  Schedule an appointment for a complete physical exam. 8)  No food after midnight before this visit. 9)  Will need PAP, mammogram, lipids. Prescriptions: DEXILANT 60 MG CPDR (DEXLANSOPRAZOLE) One tablet by mouth  daily before breakfast for stomach **Pharmacy D/C nexium**  #30 x 5   Entered and Authorized by:   Lehman Prom FNP   Signed by:   Lehman Prom FNP on 07/04/2010   Method used:   Electronically to        [facility] [address] (retail)       [address]       [place], [place]  [id number]       Ph: [phone]       Fax: [phone]   RxID:   [id number]    EKG  Procedure date:  07/04/2010  Findings:      Sinus Huston Foley rate: 54   Prevention & Chronic Care Immunizations   Influenza vaccine: Fluvax 3+  (07/04/2010)    Tetanus booster: 03/30/2008: Tdap    Pneumococcal vaccine: Not documented  Other Screening   Pap smear:  Specimen Adequacy: Satisfactory for evaluation.   Interpretation/Result:Negative for intraepithelial Lesion or Malignancy.     (04/09/2009)   Smoking status: quit  (03/21/2010)  Lipids   Total Cholesterol: 151  (04/09/2009)   LDL: 88  (04/09/2009)   LDL Direct: Not documented   HDL: 48  (04/09/2009)   Triglycerides: 76  (04/09/2009)   Nursing Instructions: Give Flu vaccine today    Influenza Vaccine    Vaccine Type: Fluvax 3+    Site: left deltoid    Mfr: GlaxoSmithKline    Dose: 0.5 ml    Route: IM    Given by: Levon Hedger    Exp. Date: 02/2011    Lot #: [id number]    VIS given: 04/19/10 version given July 04, 2010.  Flu Vaccine Consent Questions    Do you have a history of severe allergic reactions to this vaccine? no    Any prior history of allergic reactions to egg and/or gelatin? no    Do you have a sensitivity to the preservative Thimersol? no    Do you have a past history of Guillan-Barre Syndrome? no    Do you currently have an acute febrile illness? no    Have you ever had a severe reaction to latex? no    Vaccine information given and explained to patient? yes    Are you currently pregnant? no    ndc   [phone]

## 2010-10-25 NOTE — Consult Note (Signed)
Summary: [facility] Ref[facility] Form /Office Note   [facility] Ref[facility] Form /Office Note   Imported By: Roderic Ovens 08/10/2010 16:27:10  _____________________________________________________________________  External Attachment:    Type:   Image     Comment:   External Document

## 2010-10-25 NOTE — Assessment & Plan Note (Signed)
Summary: F/u GERD and palpitations   Vital Signs:  Patient profile:   40 year old female Menstrual status:  regular Height:      64.50 inches Weight:      159 pounds BMI:     26.97 Temp:     97.7 degrees F oral Pulse rate:   76 / minute Pulse rhythm:   regular Resp:     18 per minute BP sitting:   104 / 84  (left arm) Cuff size:   regular  Vitals Entered By: [place] Shannon (July 18, 2010 10:53 AM)  Nutrition Counseling: Patient's BMI is greater than 25 and therefore counseled on weight management options. CC: TWO WEEK F/U..... Is Patient Diabetic? No Pain Assessment Patient in pain? no       Does patient need assistance? Functional Status Self care Ambulation Normal   Primary Care Provider:  Lehman Prom FNP  CC:  TWO WEEK F/U......  History of Present Illness:  Pt into the office for f/u on palpitations. PA approved for dexilant but pt has yet to pick it up. She was advised on last visit about what foods intensity this problems. She presents today with nexium which she continues to take daily. Palpitations are unpredicatable. Previous wore a holter monitor which was uneventful.  Hair loss - Pt has thoughts that 2 of her medications are causing hair loss.  Metoprolol and Venlafaxine have noted side effects of hair loss but pt has continued to take the medications. Pt has not used any chemical in her hair in several months.  Current Medications (verified): 1)  Dexilant 60 Mg Cpdr (Dexlansoprazole) .... One Tablet By Mouth Daily Before Breakfast For Stomach **pharmacy D/c Nexium** 2)  Diclofenac Sodium 75 Mg Tbec (Diclofenac Sodium) .... One Tablet By Mouth Two Times A Day For Back 3)  Black Cohosh Hot Flash Relief 40 Mg Caps (Black Cohosh) .Marland Kitchen.. 1 Tablet By Mouth Two Times A Day 4)  Evening Primrose  Oil (Evening Primrose) .Marland Kitchen.. 1 Tablet By Mouth By Mouth Two Times A Day 5)  Ventolin Hfa 108 (90 Base) Mcg/act Aers (Albuterol Sulfate) .... 2 Puffs Every 6 Hours  As Neeeded For Shortness of Breath 6)  Vistaril 25 Mg Caps (Hydroxyzine Pamoate) .... One Tablet By Mouth Daily As Needed For Itching 7)  Venlafaxine Hcl 150 Mg Xr24h-Cap (Venlafaxine Hcl) .... One Capsule By Mouth Daily  **note Change in Dose** 8)  Ketoconazole 2 % Sham (Ketoconazole) .... One Applicationto Scalp Three Times Per Week 9)  Metoprolol Tartrate 25 Mg Tabs (Metoprolol Tartrate) .... 1/2 Tablet By Mouth Daily For Palpitations  Allergies (verified): 1)  ! Morphine  Review of Systems Eyes:  Complains of itching. ENT:  Complains of nasal congestion. CV:  Complains of palpitations; denies fatigue. Resp:  Denies cough. GI:  Complains of indigestion; denies abdominal pain, nausea, and vomiting.  Physical Exam  General:  alert.   Head:  no bald areas hair is just uneven - shorter in some places then others Eyes:  glasses Lungs:  normal breath sounds.   Heart:  normal rate and regular rhythm.   Abdomen:  normal bowel sounds.   Psych:  Oriented X3.     Impression & Recommendations:  Problem # 1:  GERD (ICD-530.81) pt to increase nexium to two times a day until she is able to get the dexilant Her updated medication list for this problem includes:    Dexilant 60 Mg Cpdr (Dexlansoprazole) ..... One tablet by mouth daily before breakfast for  stomach **pharmacy d/c nexium**  Problem # 2:  PALPITATIONS (ICD-785.1) pt has been started on beta blocker but pt thinks this is making her hair fall out Her updated medication list for this problem includes:    Metoprolol Tartrate 25 Mg Tabs (Metoprolol tartrate) .Marland Kitchen... 1/2 tablet by mouth daily for palpitations  Orders: Cardiology Referral (Cardiology)  Problem # 3:  ALLERGIC RHINITIS (ICD-477.9) will start allergy meds Her updated medication list for this problem includes:    Loratadine 10 Mg Tabs (Loratadine) ..... One tablet by mouth daily for allergies  Complete Medication List: 1)  Dexilant 60 Mg Cpdr (Dexlansoprazole) ....  One tablet by mouth daily before breakfast for stomach **pharmacy d/c nexium** 2)  Diclofenac Sodium 75 Mg Tbec (Diclofenac sodium) .... One tablet by mouth two times a day for back 3)  Ventolin Hfa 108 (90 Base) Mcg/act Aers (Albuterol sulfate) .... 2 puffs every 6 hours as neeeded for shortness of breath 4)  Vistaril 25 Mg Caps (Hydroxyzine pamoate) .... One tablet by mouth daily as needed for itching 5)  Venlafaxine Hcl 150 Mg Xr24h-cap (Venlafaxine hcl) .... One capsule by mouth daily  **note change in dose** 6)  Ketoconazole 2 % Sham (Ketoconazole) .... One applicationto scalp three times per week 7)  Metoprolol Tartrate 25 Mg Tabs (Metoprolol tartrate) .... 1/2 tablet by mouth daily for palpitations 8)  Loratadine 10 Mg Tabs (Loratadine) .... One tablet by mouth daily for allergies  Patient Instructions: 1)  Taper off the metoprolol. Take 1/2 tablet every OTHER day for this week  2)  then next week take every 3rd day (twice per week) 3)  Then none during the 3rd week 4)  you will be referred to cardiology for possible stress testing and additional workup on palpitations 5)  Acid Reflux/Indigestion - take nexium 40mg  by mouth two times a day until you finish this bottle of medications.  Take dexilant 60mg  by mouth daily (prescription has been sent to the pharmacy) 6)  Keep appointment for complete physical exam Prescriptions: KETOCONAZOLE 2 % SHAM (KETOCONAZOLE) One applicationto scalp three times per week  #175ml x 1   Entered and Authorized by:   Lehman Prom FNP   Signed by:   Lehman Prom FNP on 07/18/2010   Method used:   Electronically to        [facility] (retail)       [address]       [place], [place]  [id number]       Ph: [phone]       Fax: [phone]   RxID:   [id number] LORATADINE 10 MG TABS (LORATADINE) One tablet by mouth daily for allergies  #30 x 5   Entered and Authorized by:   Lehman Prom FNP   Signed by:   Lehman Prom FNP on 07/18/2010   Method used:   Electronically to        [facility] (retail)       [address]       [place], [place]  [address]       Ph: [phone]       Fax: [phone]   RxID:   [id number]    Orders Added: 1)  Est. Patient Level III [55732] 2)  Cardiology Referral [Cardiology]

## 2010-10-25 NOTE — Assessment & Plan Note (Signed)
Summary: Palpitations   Vital Signs:  Patient profile:   40 year old female Menstrual status:  regular LMP:     04/2010 Weight:      160.9 pounds BMI:     27.29 Temp:     97.9 degrees F oral Pulse rate:   61 / minute Pulse rhythm:   regular Resp:     16 per minute BP sitting:   102 / 64  (left arm) Cuff size:   regular  Vitals Entered By: Levon Hedger (May 05, 2010 9:56 AM)  Nutrition Counseling: Patient's BMI is greater than 25 and therefore counseled on weight management options. CC: pt has been having neck, back and leg pain with cramping. Is Patient Diabetic? No Pain Assessment Patient in pain? yes     Location: leg. back neck  Does patient need assistance? Functional Status Self care Ambulation Normal LMP (date): 04/2010 LMP - Character: heavy     Enter LMP: 04/2010 Last PAP Result  Specimen Adequacy: Satisfactory for evaluation.   Interpretation/Result:Negative for intraepithelial Lesion or Malignancy.      Primary Care Elinor Kleine:  Lehman Prom FNP  CC:  pt has been having neck and back and leg pain with cramping.Marland Kitchen  History of Present Illness:  Pt into the office to discuss current living situation Pt currently has a 2 level apartment - all bedrooms are upstairs and bathroom. Sitting areas and kitchen are downstairs. H/O back pain - walking up and down the steps makes the back pain worse.  Walking up the steps also exacerbates pain in her legs.   She has been in her current apartment for 5 years.  Allergies (verified): 1)  ! Morphine  Review of Systems General:  Complains of weakness. CV:  Complains of palpitations; palpitations are increasing in frequency - previous holter  pt has avoided caffiene, tobacco. . Resp:  Denies cough. GI:  Denies indigestion. MS:  Complains of joint pain and low back pain. Psych:  Complains of anxiety; pt was started on effexor during last visit at 75mg  by mouth daily which has been taking daily.  Physical  Exam  General:  alert.   Eyes:  glasses Lungs:  normal breath sounds.   Heart:  normal rate and regular rhythm.   Abdomen:  normal bowel sounds.   Msk:  up to the exam table Neurologic:  alert & oriented X3.   Skin:  color normal.   Psych:  Oriented X3.     Impression & Recommendations:  Problem # 1:  BACK PAIN, LUMBAR (ICD-724.2) advised to continue back exercises anti-inflammatories Her updated medication list for this problem includes:    Diclofenac Sodium 75 Mg Tbec (Diclofenac sodium) ..... One tablet by mouth two times a day for back  Problem # 2:  PALPITATIONS (ICD-785.1)  will start on low dose beta blocker handout given Her updated medication list for this problem includes:    Metoprolol Tartrate 25 Mg Tabs (Metoprolol tartrate) .Marland Kitchen... 1/2 tablet by mouth daily for palpitations  Problem # 3:  ANXIETY DISORDER, SITUATIONAL, MILD (ICD-309.24) will increase venlafaxine to 150mg  by mouth daily  Problem # 4:  FOLLICULITIS (ICD-704.8) recurrent in hair shampoo sent earlier this week - advised pt to avoid chemicals  Complete Medication List: 1)  Nexium 40 Mg Cpdr (Esomeprazole magnesium) .... One tablet by mouth daily for stomach 2)  Diclofenac Sodium 75 Mg Tbec (Diclofenac sodium) .... One tablet by mouth two times a day for back 3)  [facility] Relief  40 Mg Caps (Black cohosh) .Marland Kitchen.. 1 tablet by mouth two times a day 4)  Evening Primrose Oil (Evening primrose) .Marland Kitchen.. 1 tablet by mouth by mouth two times a day 5)  Ventolin Hfa 108 (90 Base) Mcg/act Aers (Albuterol sulfate) .... 2 puffs every 6 hours as neeeded for shortness of breath 6)  Clindamycin Phosphate 1 % Gel (Clindamycin phosphate) .... Apply to face two times a day - let sit for 5 minutes then rinse off with clear water 7)  Vistaril 25 Mg Caps (Hydroxyzine pamoate) .... One tablet by mouth daily as needed for itching 8)  Venlafaxine Hcl 150 Mg Xr24h-cap (Venlafaxine hcl) .... One capsule by mouth daily   **note change in dose** 9)  Ketoconazole 2 % Sham (Ketoconazole) .... One applicationto scalp three times per week 10)  Metoprolol Tartrate 25 Mg Tabs (Metoprolol tartrate) .... 1/2 tablet by mouth daily for palpitations  Patient Instructions: 1)  Palpitations - continue to avoid caffiene 2)  start metoprolol 25mg  - 1/2 tablet by mouth daily 3)  Anxiety - increase dose of venlafaxine to 150mg  by mouth daily  4)  Back pain - take diclofenac 75mg  by mouth two times a day (take with food). 5)  Follow up as needed Prescriptions: METOPROLOL TARTRATE 25 MG TABS (METOPROLOL TARTRATE) 1/2 tablet by mouth daily for palpitations  #30 x 3   Entered and Authorized by:   Lehman Prom FNP   Signed by:   Lehman Prom FNP on 05/05/2010   Method used:   Print then Give to Patient   RxID:   [id number] DICLOFENAC SODIUM 75 MG TBEC (DICLOFENAC SODIUM) One tablet by mouth two times a day for back  #60 x 3   Entered and Authorized by:   Lehman Prom FNP   Signed by:   Lehman Prom FNP on 05/05/2010   Method used:   Electronically to        [facility] (retail)       [address]       [place], [place]  [id number]       Ph: [phone]       Fax: [phone]   RxID:   [id number] VENLAFAXINE HCL 150 MG XR24H-CAP (VENLAFAXINE HCL) One capsule by mouth daily  **note change in dose**  #30 x 5   Entered and Authorized by:   Lehman Prom FNP   Signed by:   Lehman Prom FNP on 05/05/2010   Method used:   Electronically to        [facility] (retail)       [address]       [place], [place]  [address]       Ph: [phone]       Fax: [phone]   RxID:   [id number]

## 2010-10-25 NOTE — Progress Notes (Signed)
Summary: event monitor  Phone Note Outgoing Call Call back at [facility] Phone [phone]   Call placed by: Stanton Kidney, EMT-P,  July 26, 2010 10:36 AM Call placed to: Patient Action Taken: Phone Call Completed Summary of Call: Event monitor to be mailed to pt, pt aware. Stanton Kidney, EMT-P  July 26, 2010 10:36 AM
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
## 2010-10-25 NOTE — Assessment & Plan Note (Signed)
Summary: Acute - Folliculitis   Vital Signs:  Patient profile:   40 year old female Menstrual status:  regular Weight:      164 pounds Temp:     98.1 degrees F Pulse rate:   76 / minute Pulse rhythm:   regular Resp:     18 per minute BP sitting:   117 / 77  (left arm) Cuff size:   regular  Vitals Entered By: Vesta Mixer CMA (March 21, 2010 2:38 PM) CC: Rash in head and a couple of knots in the back of her head also first noticed about two days ago Is Patient Diabetic? No Pain Assessment Patient in pain? no       Does patient need assistance? Ambulation Normal   Primary Care Provider:  Lehman Prom FNP  CC:  Rash in head and a couple of knots in the back of her head also first noticed about two days ago.  History of Present Illness:  Pt into the office with complait os sores in head Pt also reports that she has been to the ER since his last visit here after being bitten by a ant. She was given a prednisone taper (pt brings empty bottle into the office) Symptoms present for 1 week Areas drain and then scabs She presents today with braids in her hair and she reports that she keeps her hair wash and she also has applied vasoline and neosporin. She using selsum blue shampoo No recent chemicals although pt has had a curl about 3 months ago Denies any sharing of combs and hat use  Anxiety - ongoing citalopram is not helpful with anxiety. She has been taking it for 4 months  Especially at times when she sleeps C/O chest pain and SOB during anxiety spells  Habits & Providers  Alcohol-Tobacco-Diet     Alcohol drinks/day: 0     Tobacco Status: quit     Tobacco Counseling: to remain off tobacco products     Cigarette Packs/Day: 1.0     Year Quit: 2010  Exercise-Depression-Behavior     Does Patient Exercise: no     Depression Counseling: not indicated; screening negative for depression     Drug Use: no     Seat Belt Use: yes  Allergies (verified): 1)  !  Morphine  Review of Systems General:  Denies fever. CV:  Complains of chest pain or discomfort. Resp:  Complains of shortness of breath; at times during anxiety attacks. GI:  Denies abdominal pain, nausea, and vomiting. MS:  Complains of joint pain; neck pain. Derm:  sore in the head. Psych:  Complains of anxiety.  Physical Exam  General:  alert.   Head:  glasses braid in hair multiple areas in scalp with pustules and some scabbed area Lungs:  normal breath sounds.   Heart:  normal rate and regular rhythm.   Abdomen:  normal bowel sounds.     Impression & Recommendations:  Problem # 1:  FOLLICULITIS (ICD-704.8) advised pt to use selsum blue shampoo will treat with keflex  Problem # 2:  ANXIETY DISORDER, SITUATIONAL, MILD (ICD-309.24) celexa is not effective will start effexor  Complete Medication List: 1)  Nexium 40 Mg Cpdr (Esomeprazole magnesium) .... One tablet by mouth daily for stomach 2)  Diclofenac Sodium 75 Mg Tbec (Diclofenac sodium) .... One tablet by mouth two times a day for back 3)  Black Cohosh Hot Flash Relief 40 Mg Caps (Black cohosh) .Marland Kitchen.. 1 tablet by mouth two times a day 4)  Evening Primrose Oil (Evening primrose) .Marland Kitchen.. 1 tablet by mouth by mouth two times a day 5)  Ventolin Hfa 108 (90 Base) Mcg/act Aers (Albuterol sulfate) .... 2 puffs every 6 hours as neeeded for shortness of breath 6)  Clindamycin Phosphate 1 % Gel (Clindamycin phosphate) .... Apply to face two times a day - let sit for 5 minutes then rinse off with clear water 7)  Vistaril 25 Mg Caps (Hydroxyzine pamoate) .... One tablet by mouth daily as needed for itching 8)  Venlafaxine Hcl 75 Mg Xr24h-cap (Venlafaxine hcl) .... One tablet by mouth daily for anxiety 9)  Keflex 500 Mg Caps (Cephalexin) .... One tablet by mouth three times a day for infection  Patient Instructions: 1)  Continue to use the selsum blue shampoo to wash hair 2)  Folliculitis - use the keflex 500mg  by mouth three times a  day for 10 days. Take with food 3)  Follow up as needed Prescriptions: KEFLEX 500 MG CAPS (CEPHALEXIN) One tablet by mouth three times a day for infection  #30 x 0   Entered and Authorized by:   Lehman Prom FNP   Signed by:   Lehman Prom FNP on 03/21/2010   Method used:   Print then Give to Patient   RxID:   [id number] VENLAFAXINE HCL 75 MG XR24H-CAP (VENLAFAXINE HCL) One tablet by mouth daily for anxiety  #30 x 3   Entered and Authorized by:   Lehman Prom FNP   Signed by:   Lehman Prom FNP on 03/21/2010   Method used:   Print then Give to Patient   RxID:   [id number]

## 2010-10-25 NOTE — Letter (Signed)
Summary: Handout Printed  Printed Handout:  - Palpitations (Irregular Heart Beat) 
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
## 2010-10-25 NOTE — Progress Notes (Signed)
Summary: Need PA for Dexilant  Phone Note Outgoing Call   Summary of Call: call pt and let her know there were no samples of stomach medications here in the office. i have sent a new prescription to the pharmacy for dexilant 60mg  by mouth 30 minutes before breakfast hopefully medicaid will cover this new medication Initial call taken by: Lehman Prom FNP,  July 04, 2010 6:21 PM  Follow-up for Phone Call        yes, proceed with PA she has been on both nexium and omeprazole without good effect please call to let pt know that we are working on this process and that she will be notified again if new medication is accepted or denied Follow-up by: Lehman Prom FNP,  July 05, 2010 6:27 PM  Additional Follow-up for Phone Call Additional follow up Details #1::        Pt. made aware -- PA in progress.  Dutch Quint RN  July 06, 2010 12:08 PM  PA approved x1 year. Dutch Quint RN  July 15, 2010 2:12 PM     New/Updated Medications: DEXILANT 60 MG CPDR (DEXLANSOPRAZOLE) One tablet by mouth daily before breakfast for stomach **Pharmacy D/C nexium**

## 2010-10-25 NOTE — Miscellaneous (Signed)
Summary: PA approved for Dexilant x1 year 07/13/10 - 07/13/11  Clinical Lists Changes  PA approved for Dexilant x1 year 07/13/10 - 07/13/11

## 2010-10-25 NOTE — Progress Notes (Signed)
Summary: Holter monitor results  Phone Note Outgoing Call   Summary of Call: Call to get holter monitor results faxed to this office Initial call taken by: Lehman Prom FNP,  November 08, 2009 1:49 PM  Follow-up for Phone Call        results will be faxed to office. Follow-up by: Levon Hedger,  November 12, 2009 1:02 PM

## 2010-10-25 NOTE — Progress Notes (Signed)
Summary: F/U Event Monitor- Atrial Flutter  Phone Note Outgoing Call   Call placed by: Whitney Maeola Sarah RN,  August 24, 2010 4:10 PM  Follow-up for Phone Call        Event monitor is showing an episode of atrial flutter with HR 135-145pt. Strip is only showing 60 seconds. Not sure how long the episode lasted. She is having some SOB/CP with these episodes. She can feel her heart racing. She is having some diarrhea and may be dehydrated. Discussed with Dr. Gala Romney. If pt. symptoms persists, we will bring her in to the office for an EKG. If not, I will add her on to Dr. Charlott Rakes schedule for Monday 08/29/10. Follow-up by: Whitney Maeola Sarah RN,  August 24, 2010 4:23 PM     Appended Document: rov/wpa Reivewed monitor with EP. Short burst of tachycardia. one time only.  Otherwise only SR and ST Recommend staying on B Blocker  would incrase  Would also increase exercise training to decrase SN activity. F/U in a few months.

## 2010-10-25 NOTE — Letter (Signed)
Summary: Handout Printed  Printed Handout:  - Anxiety and Panic Attacks 

## 2010-10-25 NOTE — Miscellaneous (Signed)
Summary: New Rx  Clinical Lists Changes  Medications: Removed medication of KEFLEX 500 MG CAPS (CEPHALEXIN) One tablet by mouth three times a day for infection Added new medication of KETOCONAZOLE 2 % SHAM (KETOCONAZOLE) One applicationto scalp three times per week - Signed Rx of KETOCONAZOLE 2 % SHAM (KETOCONAZOLE) One applicationto scalp three times per week;  #178ml x 1;  Signed;  Entered by: Lehman Prom FNP;  Authorized by: Lehman Prom FNP;  Method used: Electronically to [facility] [address], [place], [place]  [id number], Ph: [phone], Fax: [phone]    Prescriptions: KETOCONAZOLE 2 % SHAM (KETOCONAZOLE) One applicationto scalp three times per week  #139ml x 1   Entered and Authorized by:   Lehman Prom FNP   Signed by:   Lehman Prom FNP on 04/29/2010   Method used:   Electronically to        [facility] (retail)       [address]       [place], [place]  [id number]       Ph: [phone]       Fax: [phone]   RxID:   [id number]

## 2010-10-25 NOTE — Medication Information (Signed)
Summary: RX Folder//PA//DEXILANT//APPROVED  RX Folder//PA//DEXILANT//APPROVED   Imported By: Arta Bruce 07/18/2010 12:22:21  _____________________________________________________________________  External Attachment:    Type:   Image     Comment:   External Document
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
## 2010-10-25 NOTE — Progress Notes (Signed)
Summary: NEEDS LETTER FOR HOUSING  Phone Note Call from Patient Call back at Home Phone [phone]   Summary of Call: [facility]ART[facility] PT. MS HOPPER DROPPED OFF A PAPER FROM HOUSING BECAUSE SHE IS TRYING TO TRANSFER TOA ONE LEVEL APARTMENT WHERE SHE WONT HAVE TO WALK UP AND DOWN STEPS BECAUSE OF THE PAIN SHE HAS IN HER BACK AND LEGS.  Initial call taken by: Leodis Rains,  May 02, 2010 4:12 PM  Follow-up for Phone Call        forward to N. Martin,fnp Follow-up by: Levon Hedger,  May 02, 2010 4:33 PM  Additional Follow-up for Phone Call Additional follow up Details #1::        Is the form in my office? I have not seen it. Additional Follow-up by: Lehman Prom FNP,  May 03, 2010 12:30 PM    Additional Follow-up for Phone Call Additional follow up Details #2::    pt in office today. Follow-up by: Levon Hedger,  May 05, 2010 10:25 AM
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
## 2010-10-27 NOTE — Procedures (Signed)
Summary: Summary Report  Summary Report   Imported By: Erle Crocker 10/03/2010 16:09:31  _____________________________________________________________________  External Attachment:    Type:   Image     Comment:   External Document

## 2010-10-27 NOTE — Assessment & Plan Note (Signed)
Summary: follow up increase dose of Metoprolol/jr   Visit Type:  Follow-up Primary Provider:  Lehman Prom FNP  CC:  Chest pains and palpitations.  History of Present Illness: patient is a 40 year old who I saw earlier this winter for palpitations and Chest pressure. The patient wore a holter monitor that showed only a short burst of atrial tach.  I recommended continue b blocker, increased does.  I also recomm increasing her physical activitiy to incrase vagal ton,decreasing physical activity. Since seen she continues to have palpitaitons and with this chest pressure.  No syncope or signif dizziness.  Current Medications (verified): 1)  Dexilant 60 Mg Cpdr (Dexlansoprazole) .... One Tablet By Mouth Daily Before Breakfast For Stomach **pharmacy D/c Nexium** 2)  Diclofenac Sodium 75 Mg Tbec (Diclofenac Sodium) .... One Tablet By Mouth Two Times A Day For Back 3)  Ventolin Hfa 108 (90 Base) Mcg/act Aers (Albuterol Sulfate) .... 2 Puffs Every 6 Hours As Neeeded For Shortness of Breath 4)  Venlafaxine Hcl 150 Mg Xr24h-Cap (Venlafaxine Hcl) .... One Capsule By Mouth Daily  **note Change in Dose** 5)  Ketoconazole 2 % Sham (Ketoconazole) .... One Applicationto Scalp Three Times Per Week 6)  Loratadine 10 Mg Tabs (Loratadine) .... One Tablet By Mouth Daily For Allergies 7)  Metoprolol Tartrate 25 Mg Tabs (Metoprolol Tartrate) .... Take 1 Tablet By Mouth Two Times A Day 8)  Hydrocodone-Acetaminophen 5-325 Mg Tabs (Hydrocodone-Acetaminophen) .... As Needed  Allergies: 1)  ! Morphine  Past History:  Past Surgical History: Last updated: 01/27/2008 Tubal ligation 1993  Family History: Last updated: 01/27/2008 daughter and son with headaches Family History Hypertension - htn  Social History: Last updated: 08/26/2009 Single G2P2 - natural births Prior Smoker - cigarette - quit last month Alcohol use-no Drug use-no  Past Medical History: 1. Pneumonia with cavitation of left lower  lobe.   2. Asthma.   3. Migraine.   4. Hot flashes.   5.  Palpitaitons. 6.  Chest pressure.  Review of Systems       All systerms reviewed.  NEg to the above problem except as noted.  Vital Signs:  Patient profile:   40 year old female Menstrual status:  regular Height:      64.50 inches Weight:      162 pounds BMI:     27.48 Pulse rate:   57 / minute Pulse rhythm:   regular Resp:     18 per minute BP sitting:   120 / 80  (left arm) Cuff size:   large  Vitals Entered By: Vikki Ports (October 07, 2010 10:36 AM)  Physical Exam  Additional Exam:  patient is in NAD HEENT:  Normocephalic, atraumatic. EOMI, PERRLA.  Neck: JVP is normal. No thyromegaly. No bruits.  Lungs: clear to auscultation. No rales no wheezes.  Heart: Regular rate and rhythm. Normal S1, S2. No S3.   No significant murmurs. PMI not displaced.  Abdomen:  Supple, nontender. Normal bowel sounds. No masses. No hepatomegaly.  Extremities:   Good distal pulses throughout. No lower extremity edema.  Musculoskeletal :moving all extremities.  Neuro:   alert and oriented x3.    EKG  Procedure date:  10/07/2010  Findings:      Sinus bradycardia.  57 bpm.  Septal infarct.  Impression & Recommendations:  Problem # 1:  PALPITATIONS (ICD-785.1) Patient still complains of palpitations.  I would recomm a trial of Cardiazem CD 180 mg.  F/U in 4 to 5 wks.  Problem #  2:  CHEST PAIN, LEFT (ICD-786.50) OCcurs with palpitations.  Trial of Dilt. Her updated medication list for this problem includes:    Diltiazem Hcl Er Beads 180 Mg Xr24h-cap (Diltiazem hcl er beads) .Marland Kitchen... Take one capsule by mouth daily  Patient Instructions: 1)  Your physician recommends that you schedule a follow-up appointment in: 4-6 weeks with Dr. Tenny Craw 2)  Your physician has recommended you make the following change in your medication: STOP Metoprolol   START: Cardizem (Diltiazem) Prescriptions: DILTIAZEM HCL ER BEADS 180 MG XR24H-CAP  (DILTIAZEM HCL ER BEADS) Take one capsule by mouth daily  #30 x 6   Entered by:   Lisabeth Devoid RN   Authorized by:   Sherrill Raring, MD, [facility]   Signed by:   Lisabeth Devoid RN on 10/07/2010   Method used:   Electronically to        [facility] [address] (retail)       [address]       [place], [place]  [id number]       Ph: [phone]       Fax: [phone]   RxID:   [id number]

## 2010-10-27 NOTE — Assessment & Plan Note (Signed)
Summary: rov/wpa   Visit Type:  Follow-up Primary Provider:  Lehman Prom FNP  CC:  shortness of breath /  some chest discomfort / lightheaded.  History of Present Illness: patient is a 40 year old who was referred for evaluation of chest pain and palpitations.  I saw her for the first time in October.   The patient said about 1 year ago she began having palpitaitons.  Episodes start abruptly.  they last anywhere from 30 min to 1 to 2 hours.  Accomp by weakness, dizziness.  No syncope.  One time had numbness on one side of body.  End slowly. The patient also notes episodes of chest pressure.  Occur usually when her heart is racing.   She alson notes some SOB  Again, mainly when her heart is racing.   She was set up with an event monitor after I saw her.  She had one spell of question tachycardia on 11/28.   Per the patient it was signif with symptoms. She denies syncope.  Current Medications (verified): 1)  Dexilant 60 Mg Cpdr (Dexlansoprazole) .... One Tablet By Mouth Daily Before Breakfast For Stomach **pharmacy D/c Nexium** 2)  Diclofenac Sodium 75 Mg Tbec (Diclofenac Sodium) .... One Tablet By Mouth Two Times A Day For Back 3)  Ventolin Hfa 108 (90 Base) Mcg/act Aers (Albuterol Sulfate) .... 2 Puffs Every 6 Hours As Neeeded For Shortness of Breath 4)  Venlafaxine Hcl 150 Mg Xr24h-Cap (Venlafaxine Hcl) .... One Capsule By Mouth Daily  **note Change in Dose** 5)  Ketoconazole 2 % Sham (Ketoconazole) .... One Applicationto Scalp Three Times Per Week 6)  Loratadine 10 Mg Tabs (Loratadine) .... One Tablet By Mouth Daily For Allergies 7)  Metoprolol Succinate 25 Mg Xr24h-Tab (Metoprolol Succinate) .... Take 1/2 Tablet By Mouth Daily (Hold While On Heart Monitor)  Allergies (verified): 1)  ! Morphine  Past History:  Past medical, surgical, family and social histories (including risk factors) reviewed, and no changes noted (except as noted below).  Past Medical History: 1. Pneumonia  with cavitation of left lower lobe.   2. Asthma.   3. Migraine.   4. Hot flashes.   5.  Palpitaitons.  Past Surgical History: Reviewed history from 01/27/2008 and no changes required. Tubal ligation 1993  Family History: Reviewed history from 01/27/2008 and no changes required. daughter and son with headaches Family History Hypertension - htn  Social History: Reviewed history from 08/26/2009 and no changes required. Single G2P2 - natural births Prior Smoker - cigarette - quit last month Alcohol use-no Drug use-no  Vital Signs:  Patient profile:   40 year old female Menstrual status:  regular Height:      64.50 inches Weight:      166 pounds BMI:     28.16 Pulse rate:   65 / minute Pulse (ortho):   61 / minute BP sitting:   118 / 78  (left arm) BP standing:   125 / 83 Cuff size:   regular  Vitals Entered By: Hardin Negus, RMA (August 29, 2010 2:18 PM)  Serial Vital Signs/Assessments:  Time      Position  BP       Pulse  Resp  Temp     By           Lying RA  124/85   64                    Hardin Negus, RMA  Sitting   126/82   [address] [place]           Standing  125/83   61                    Hardin Negus, [place]  Comments: 2 mins  119/85  P 64 (slight lightheaded started when first standing) 5 mins  123/86  P 65 ( same) By: Hardin Negus, RMA    Physical Exam  Additional Exam:  Patient is in NAD HEENT:  Normocephalic, atraumatic. EOMI, PERRLA.  Neck: JVP is normal. No thyromegaly. No bruits.  Lungs: clear to auscultation. No rales no wheezes.  Heart: Regular rate and rhythm. Normal S1, S2. No S3.   No significant murmurs. PMI not displaced.  Abdomen:  Supple, nontender. Normal bowel sounds. No masses. No hepatomegaly.  Extremities:   Good distal pulses throughout. No lower extremity edema.  Musculoskeletal :moving all extremities.  Neuro:   alert and oriented x3.    Impression & Recommendations:  Problem # 1:   PALPITATIONS (ICD-785.1) Pt with continued palpitations.  I need to get more recordings  from 11/28 to see the onset of the fast heart rate.   She is not orthostatic on exam I have recomm that she begin taking metoprolol again  She can take 25 or even 50 mg per day.  Problem # 2:  CHEST PAIN, LEFT (ICD-786.50) I am not convinced represents signif cardiac problem.  Occur with palpitations only.   Follow. Her updated medication list for this problem includes:    Metoprolol Succinate 25 Mg Xr24h-tab (Metoprolol succinate) .Marland Kitchen... Take 1/2 tablet by mouth daily (hold while on heart monitor)  Appended Document: rov/wpa Reivewed monitor with EP. Short burst of tachycardia. one time only.  Otherwise only SR and ST Recommend staying on B Blocker  would incrase  Would also increase exercise training to decrase SN activity. F/U in a few months.  Appended Document: rov/wpa LMOM for call back.  Appended Document: rov/wpa Called patient with monitor results. She is currently taking Metoprolol tartrate 25mg  2 times per day and still having break thru palps. Advised her to increase metoprolol tartrate to 37.5mg  2 times per day and follow up with Dr.Ross on 10/07/2010. She will cause [place] back if symptoms not improved. Will let Dr.Ross know.

## 2010-10-27 NOTE — Miscellaneous (Signed)
  Clinical Lists Changes  Medications: Removed medication of METOPROLOL SUCCINATE 25 MG XR24H-TAB (METOPROLOL SUCCINATE) Take 1/2 tablet by mouth daily (HOLD while on heart monitor) Added new medication of METOPROLOL TARTRATE 25 MG TABS (METOPROLOL TARTRATE) one and one half tabs 2 times per day

## 2010-11-10 ENCOUNTER — Ambulatory Visit: Payer: Self-pay | Admitting: Internal Medicine

## 2010-12-09 ENCOUNTER — Ambulatory Visit (INDEPENDENT_AMBULATORY_CARE_PROVIDER_SITE_OTHER): Payer: Medicaid Other | Admitting: Internal Medicine

## 2010-12-09 ENCOUNTER — Encounter: Payer: Self-pay | Admitting: Internal Medicine

## 2010-12-09 DIAGNOSIS — R002 Palpitations: Secondary | ICD-10-CM

## 2010-12-09 DIAGNOSIS — IMO0001 Reserved for inherently not codable concepts without codable children: Secondary | ICD-10-CM | POA: Insufficient documentation

## 2010-12-13 LAB — CONVERTED CEMR LAB
Anti Nuclear Antibody(ANA): NEGATIVE
Sed Rate: 4 mm/hr (ref 0–22)
Total CK: 98 units/L (ref 7–177)

## 2010-12-13 NOTE — Assessment & Plan Note (Addendum)
Summary: f4-6w/dfg/fu cardizem/ac   Primary Provider:  Lehman Prom FNP  CC:  pt complains of leg cramps and pain.  History of Present Illness: patient is a 40 year old who was referred for evaluation of chest pain and palpitations.  I saw her last in December.  She wore an event monitor an the only significant finding was one short spell of atrial tach (1 min).  I dicussed with EP.  INitally recomm continued b blockade and aerobic training to increase vagal tone.  When I saw her in december she was still complaining of palp.  I switched her to Diltiazem With the switch she has noted no difference in the palpitaitons.  She does note an achiness in her legs.  No signif swellling . Still with intermitt chest pains.  Current Medications (verified): 1)  Dexilant 60 Mg Cpdr (Dexlansoprazole) .... One Tablet By Mouth Daily Before Breakfast For Stomach **pharmacy D/c Nexium** 2)  Diclofenac Sodium 75 Mg Tbec (Diclofenac Sodium) .... One Tablet By Mouth Two Times A Day For Back 3)  Ventolin Hfa 108 (90 Base) Mcg/act Aers (Albuterol Sulfate) .... 2 Puffs Every 6 Hours As Neeeded For Shortness of Breath 4)  Venlafaxine Hcl 150 Mg Xr24h-Cap (Venlafaxine Hcl) .... One Capsule By Mouth Daily  **note Change in Dose** 5)  Ketoconazole 2 % Sham (Ketoconazole) .... One Applicationto Scalp Three Times Per Week 6)  Loratadine 10 Mg Tabs (Loratadine) .... One Tablet By Mouth Daily For Allergies 7)  Diltiazem Hcl Er Beads 180 Mg Xr24h-Cap (Diltiazem Hcl Er Beads) .... Take One Capsule By Mouth Daily 8)  Hydrocodone-Acetaminophen 5-325 Mg Tabs (Hydrocodone-Acetaminophen) .... As Needed  Allergies (verified): 1)  ! Morphine  Past History:  Past medical, surgical, family and social histories (including risk factors) reviewed, and no changes noted (except as noted below).  Past Medical History: Reviewed history from 10/07/2010 and no changes required. 1. Pneumonia with cavitation of left lower lobe.   2.  Asthma.   3. Migraine.   4. Hot flashes.   5.  Palpitaitons. 6.  Chest pressure.  Past Surgical History: Reviewed history from 01/27/2008 and no changes required. Tubal ligation 1993  Family History: Reviewed history from 01/27/2008 and no changes required. daughter and son with headaches Family History Hypertension - htn  Social History: Reviewed history from 08/26/2009 and no changes required. Single G2P2 - natural births Prior Smoker - cigarette - quit last month Alcohol use-no Drug use-no  Review of Systems       All systems reviewed.  Neg to the above problem except as noted above  Vital Signs:  Patient profile:   40 year old female Menstrual status:  regular Height:      64.50 inches Weight:      162 pounds BMI:     27.48 Pulse rate:   74 / minute Pulse rhythm:   regular BP sitting:   120 / 64  (left arm) Cuff size:   large  Vitals Entered By: Judithe Modest CMA (December 09, 2010 11:43 AM)  Physical Exam  Additional Exam:  Patient is in NAD HEENT:  Normocephalic, atraumatic. EOMI, PERRLA.  Neck: JVP is normal. No thyromegaly. No bruits.  Lungs: clear to auscultation. No rales no wheezes.  Heart: Regular rate and rhythm. Normal S1, S2. No S3.   No significant murmurs. PMI not displaced.  Abdomen:  Supple, nontender. Normal bowel sounds. No masses. No hepatomegaly.  Extremities:   Good distal pulses throughout. No lower extremity edema.  Musculoskeletal :  moving all extremities.   legs tender to palpation of thighs. Neuro:   alert and oriented x3.    Impression & Recommendations:  Problem # 1:  PALPITATIONS (ICD-785.1) Still with signif palpitations.  I would switch back to metoprolol given achieness (though I am not convinced related) I will review with EP. Schedule echo with CP, palp  Problem # 2:  MYALGIA (ICD-729.1) Check labs.  D/C Dilt. Orders: T-CK Total [phone]) T-Antinuclear Antib (ANA) [phone]) T-Sed Rate (Automated)  [phone])  Other Orders: Echocardiogram (Echo) Event (Event)  Patient Instructions: 1)  Your physician has requested that you have an echocardiogram.  Echocardiography is a painless test that uses sound waves to create images of your heart. It provides your doctor with information about the size and shape of your heart and how well your heart's chambers and valves are working.  This procedure takes approximately one hour. There are no restrictions for this procedure. 2)  Your physician recommends that you return for lab work in: lab work today we will call you with results 3)  Your physician has recommended that you wear an event monitor.  Event monitors are medical devices that record the heart's electrical activity. Doctors most often use these monitors to diagnose arrhythmias. Arrhythmias are problems with the speed or rhythm of the heartbeat. The monitor is a small, portable device. You can wear one while you do your normal daily activities. This is usually used to diagnose what is causing palpitations/syncope (passing out). for JUNE 2012 4)  Your physician has recommended you make the following change in your medication: STOP Dittiazem START Metoprolol Prescriptions: METOPROLOL SUCCINATE 25 MG XR24H-TAB (METOPROLOL SUCCINATE) 1 tab every day  #30 x 6   Entered by:   Layne Benton, RN, BSN   Authorized by:   Sherrill Raring, MD, [facility]   Signed by:   Layne Benton, RN, BSN on 12/09/2010   Method used:   Electronically to        [facility] (retail)       [address]       [place], [place]  [id number]       Ph: [phone]       Fax: [phone]   RxID:   [id number]

## 2010-12-21 ENCOUNTER — Other Ambulatory Visit (HOSPITAL_COMMUNITY): Payer: Self-pay | Admitting: Internal Medicine

## 2010-12-21 ENCOUNTER — Telehealth (INDEPENDENT_AMBULATORY_CARE_PROVIDER_SITE_OTHER): Payer: Self-pay | Admitting: Nurse Practitioner

## 2010-12-21 DIAGNOSIS — R002 Palpitations: Secondary | ICD-10-CM

## 2010-12-22 ENCOUNTER — Ambulatory Visit (HOSPITAL_COMMUNITY): Payer: Medicaid Other | Attending: Internal Medicine | Admitting: Radiology

## 2010-12-22 DIAGNOSIS — J189 Pneumonia, unspecified organism: Secondary | ICD-10-CM | POA: Insufficient documentation

## 2010-12-22 DIAGNOSIS — R002 Palpitations: Secondary | ICD-10-CM | POA: Insufficient documentation

## 2010-12-22 DIAGNOSIS — G43909 Migraine, unspecified, not intractable, without status migrainosus: Secondary | ICD-10-CM | POA: Insufficient documentation

## 2010-12-22 DIAGNOSIS — IMO0001 Reserved for inherently not codable concepts without codable children: Secondary | ICD-10-CM | POA: Insufficient documentation

## 2010-12-22 DIAGNOSIS — R079 Chest pain, unspecified: Secondary | ICD-10-CM | POA: Insufficient documentation

## 2010-12-26 ENCOUNTER — Telehealth: Payer: Self-pay | Admitting: Internal Medicine

## 2010-12-26 LAB — URINALYSIS, ROUTINE W REFLEX MICROSCOPIC
Bilirubin Urine: NEGATIVE
Hgb urine dipstick: NEGATIVE
Specific Gravity, Urine: 1.028 (ref 1.005–1.030)
Urobilinogen, UA: 0.2 mg/dL (ref 0.0–1.0)

## 2010-12-26 LAB — GC/CHLAMYDIA PROBE AMP, GENITAL
Chlamydia, DNA Probe: NEGATIVE
GC Probe Amp, Genital: NEGATIVE

## 2010-12-26 LAB — WET PREP, GENITAL
Clue Cells Wet Prep HPF POC: NONE SEEN
Trich, Wet Prep: NONE SEEN

## 2010-12-26 LAB — POCT PREGNANCY, URINE: Preg Test, Ur: NEGATIVE

## 2010-12-26 NOTE — Telephone Encounter (Signed)
Pt rtn call 

## 2010-12-26 NOTE — Telephone Encounter (Signed)
Called patient with echo results.

## 2010-12-27 NOTE — Progress Notes (Signed)
Summary: Query:  Refill diclofenac and venlafaxine?  Phone Note Outgoing Call   Summary of Call: Last seen 06/2010.  F/U with provider scheduled 01/03/11.  Refill diclofenac and venlafaxine per protocol? Initial call taken by: Dutch Quint RN,  December 21, 2010 2:19 PM  Follow-up for Phone Call        yes, ok to refill Follow-up by: Lehman Prom FNP,  December 21, 2010 2:24 PM  Additional Follow-up for Phone Call Additional follow up Details #1::        Noted.  Refills completed.  Dutch Quint RN  December 21, 2010 2:47 PM

## 2010-12-29 LAB — AFB CULTURE WITH SMEAR (NOT AT ARMC): Acid Fast Smear: NONE SEEN

## 2010-12-29 LAB — DIFFERENTIAL
Eosinophils Absolute: 0.1 10*3/uL (ref 0.0–0.7)
Lymphocytes Relative: 25 % (ref 12–46)
Lymphs Abs: 2.4 10*3/uL (ref 0.7–4.0)
Monocytes Relative: 6 % (ref 3–12)
Neutro Abs: 6.1 10*3/uL (ref 1.7–7.7)
Neutrophils Relative %: 64 % (ref 43–77)

## 2010-12-29 LAB — HEPATIC FUNCTION PANEL
Alkaline Phosphatase: 103 U/L (ref 39–117)
Indirect Bilirubin: 0.6 mg/dL (ref 0.3–0.9)
Total Bilirubin: 0.8 mg/dL (ref 0.3–1.2)

## 2010-12-29 LAB — FUNGUS CULTURE W SMEAR

## 2010-12-29 LAB — URINE MICROSCOPIC-ADD ON

## 2010-12-29 LAB — BASIC METABOLIC PANEL
BUN: 5 mg/dL — ABNORMAL LOW (ref 6–23)
CO2: 24 mEq/L (ref 19–32)
Calcium: 8.2 mg/dL — ABNORMAL LOW (ref 8.4–10.5)
Calcium: 9.1 mg/dL (ref 8.4–10.5)
Chloride: 105 mEq/L (ref 96–112)
Creatinine, Ser: 0.6 mg/dL (ref 0.4–1.2)
Creatinine, Ser: 0.61 mg/dL (ref 0.4–1.2)
GFR calc Af Amer: >60 mL/min (ref >60)
GFR calc Af Amer: >60 mL/min (ref >60)
GFR calc Af Amer: >60 mL/min (ref >60)
GFR calc non Af Amer: >60 mL/min (ref >60)
GFR calc non Af Amer: >60 mL/min (ref >60)
Sodium: 132 mEq/L — ABNORMAL LOW (ref 135–145)
Sodium: 135 mEq/L (ref 135–145)

## 2010-12-29 LAB — URINALYSIS, ROUTINE W REFLEX MICROSCOPIC
Glucose, UA: NEGATIVE mg/dL
Ketones, ur: NEGATIVE mg/dL
pH: 7 (ref 5.0–8.0)

## 2010-12-29 LAB — GRAM STAIN

## 2010-12-29 LAB — CBC
HCT: 33 % — ABNORMAL LOW (ref 36.0–46.0)
Hemoglobin: 11.3 g/dL — ABNORMAL LOW (ref 12.0–15.0)
MCHC: 34.3 g/dL (ref 30.0–36.0)
MCV: 82.4 fL (ref 78.0–100.0)
MCV: 83.3 fL (ref 78.0–100.0)
Platelets: 212 10*3/uL (ref 150–400)
Platelets: 239 10*3/uL (ref 150–400)
RBC: 3.94 MIL/uL (ref 3.87–5.11)
RBC: 4.01 MIL/uL (ref 3.87–5.11)
RBC: 4.3 MIL/uL (ref 3.87–5.11)
RDW: 13.9 % (ref 11.5–15.5)
WBC: 5 10*3/uL (ref 4.0–10.5)
WBC: 7.9 10*3/uL (ref 4.0–10.5)
WBC: 9.5 10*3/uL (ref 4.0–10.5)

## 2010-12-29 LAB — CULTURE, BLOOD (ROUTINE X 2): Culture: NO GROWTH

## 2010-12-29 LAB — CULTURE, RESPIRATORY W GRAM STAIN

## 2010-12-29 LAB — EXPECTORATED SPUTUM ASSESSMENT W GRAM STAIN, RFLX TO RESP C

## 2010-12-29 LAB — PREGNANCY, URINE: Preg Test, Ur: NEGATIVE

## 2011-01-05 LAB — URINE MICROSCOPIC-ADD ON

## 2011-01-05 LAB — URINALYSIS, ROUTINE W REFLEX MICROSCOPIC
Glucose, UA: NEGATIVE mg/dL
Hgb urine dipstick: NEGATIVE
Specific Gravity, Urine: 1.026 (ref 1.005–1.030)
pH: 6.5 (ref 5.0–8.0)

## 2011-01-05 LAB — POCT PREGNANCY, URINE: Preg Test, Ur: NEGATIVE

## 2011-01-14 ENCOUNTER — Emergency Department (HOSPITAL_COMMUNITY): Payer: Medicaid Other

## 2011-01-14 ENCOUNTER — Emergency Department (HOSPITAL_COMMUNITY)
Admission: EM | Admit: 2011-01-14 | Discharge: 2011-01-14 | Disposition: A | Discharge status: Home / Self Care | Payer: Medicaid Other | Attending: Emergency Medicine | Admitting: Emergency Medicine

## 2011-01-14 DIAGNOSIS — J45909 Unspecified asthma, uncomplicated: Secondary | ICD-10-CM | POA: Insufficient documentation

## 2011-01-14 DIAGNOSIS — R059 Cough, unspecified: Secondary | ICD-10-CM | POA: Insufficient documentation

## 2011-01-14 DIAGNOSIS — K219 Gastro-esophageal reflux disease without esophagitis: Secondary | ICD-10-CM | POA: Insufficient documentation

## 2011-01-14 DIAGNOSIS — R079 Chest pain, unspecified: Secondary | ICD-10-CM | POA: Insufficient documentation

## 2011-01-14 DIAGNOSIS — Z79899 Other long term (current) drug therapy: Secondary | ICD-10-CM | POA: Insufficient documentation

## 2011-01-14 DIAGNOSIS — R112 Nausea with vomiting, unspecified: Secondary | ICD-10-CM | POA: Insufficient documentation

## 2011-01-14 DIAGNOSIS — R05 Cough: Secondary | ICD-10-CM | POA: Insufficient documentation

## 2011-01-14 DIAGNOSIS — R197 Diarrhea, unspecified: Secondary | ICD-10-CM | POA: Insufficient documentation

## 2011-01-14 LAB — COMPREHENSIVE METABOLIC PANEL
Albumin: 4 g/dL (ref 3.5–5.2)
BUN: 7 mg/dL (ref 6–23)
Chloride: 108 mEq/L (ref 96–112)
Creatinine, Ser: 0.58 mg/dL (ref 0.4–1.2)
Glucose, Bld: 81 mg/dL (ref 70–99)
Total Bilirubin: 0.4 mg/dL (ref 0.3–1.2)
Total Protein: 7.5 g/dL (ref 6.0–8.3)

## 2011-01-14 LAB — DIFFERENTIAL
Basophils Absolute: 0 10*3/uL (ref 0.0–0.1)
Basophils Relative: 0 % (ref 0–1)
Eosinophils Absolute: 0.1 10*3/uL (ref 0.0–0.7)
Eosinophils Relative: 1 % (ref 0–5)
Monocytes Absolute: 0.6 10*3/uL (ref 0.1–1.0)
Neutro Abs: 3 10*3/uL (ref 1.7–7.7)

## 2011-01-14 LAB — URINALYSIS, ROUTINE W REFLEX MICROSCOPIC
Ketones, ur: NEGATIVE mg/dL
Nitrite: NEGATIVE
Protein, ur: NEGATIVE mg/dL

## 2011-01-14 LAB — CBC
MCHC: 33 g/dL (ref 30.0–36.0)
RDW: 14.5 % (ref 11.5–15.5)

## 2011-01-14 LAB — POCT PREGNANCY, URINE: Preg Test, Ur: NEGATIVE

## 2011-01-14 LAB — LIPASE, BLOOD: Lipase: 25 U/L (ref 11–59)

## 2011-02-27 ENCOUNTER — Encounter: Payer: Self-pay | Admitting: Internal Medicine

## 2011-03-13 ENCOUNTER — Ambulatory Visit: Payer: Medicaid Other | Admitting: Internal Medicine

## 2011-03-19 ENCOUNTER — Emergency Department (HOSPITAL_COMMUNITY)
Admission: EM | Admit: 2011-03-19 | Discharge: 2011-03-19 | Disposition: A | Discharge status: Home / Self Care | Payer: Medicaid Other | Attending: Emergency Medicine | Admitting: Emergency Medicine

## 2011-03-19 DIAGNOSIS — H53149 Visual discomfort, unspecified: Secondary | ICD-10-CM | POA: Insufficient documentation

## 2011-03-19 DIAGNOSIS — G43909 Migraine, unspecified, not intractable, without status migrainosus: Secondary | ICD-10-CM | POA: Insufficient documentation

## 2011-03-19 DIAGNOSIS — R112 Nausea with vomiting, unspecified: Secondary | ICD-10-CM | POA: Insufficient documentation

## 2011-03-19 DIAGNOSIS — K219 Gastro-esophageal reflux disease without esophagitis: Secondary | ICD-10-CM | POA: Insufficient documentation

## 2011-03-19 DIAGNOSIS — J45909 Unspecified asthma, uncomplicated: Secondary | ICD-10-CM | POA: Insufficient documentation

## 2011-03-25 ENCOUNTER — Ambulatory Visit (HOSPITAL_COMMUNITY)
Admission: RE | Admit: 2011-03-25 | Discharge: 2011-03-25 | Disposition: A | Discharge status: Home / Self Care | Payer: Medicaid Other | Source: Ambulatory Visit | Attending: Internal Medicine | Admitting: Internal Medicine

## 2011-03-25 ENCOUNTER — Other Ambulatory Visit: Payer: Self-pay | Admitting: Internal Medicine

## 2011-03-25 DIAGNOSIS — M25539 Pain in unspecified wrist: Secondary | ICD-10-CM | POA: Insufficient documentation

## 2011-03-25 DIAGNOSIS — R52 Pain, unspecified: Secondary | ICD-10-CM

## 2011-03-25 DIAGNOSIS — M25579 Pain in unspecified ankle and joints of unspecified foot: Secondary | ICD-10-CM | POA: Insufficient documentation

## 2011-03-27 ENCOUNTER — Ambulatory Visit (INDEPENDENT_AMBULATORY_CARE_PROVIDER_SITE_OTHER): Payer: Medicaid Other | Admitting: Internal Medicine

## 2011-03-27 ENCOUNTER — Encounter (INDEPENDENT_AMBULATORY_CARE_PROVIDER_SITE_OTHER): Payer: Medicaid Other

## 2011-03-27 ENCOUNTER — Encounter: Payer: Self-pay | Admitting: Internal Medicine

## 2011-03-27 DIAGNOSIS — R002 Palpitations: Secondary | ICD-10-CM

## 2011-03-27 DIAGNOSIS — IMO0001 Reserved for inherently not codable concepts without codable children: Secondary | ICD-10-CM

## 2011-03-27 NOTE — Patient Instructions (Signed)
Your physician wants you to follow-up in: 8 months you will receive a reminder letter in the mail two months in advance. If you don't receive a letter, please call our office to schedule the follow-up appointment.

## 2011-03-31 ENCOUNTER — Emergency Department (HOSPITAL_COMMUNITY)
Admission: EM | Admit: 2011-03-31 | Discharge: 2011-03-31 | Disposition: A | Discharge status: Home / Self Care | Payer: Medicaid Other | Attending: Emergency Medicine | Admitting: Emergency Medicine

## 2011-03-31 DIAGNOSIS — R197 Diarrhea, unspecified: Secondary | ICD-10-CM | POA: Insufficient documentation

## 2011-03-31 DIAGNOSIS — R112 Nausea with vomiting, unspecified: Secondary | ICD-10-CM | POA: Insufficient documentation

## 2011-03-31 DIAGNOSIS — R109 Unspecified abdominal pain: Secondary | ICD-10-CM | POA: Insufficient documentation

## 2011-03-31 LAB — CBC
HCT: 40.1 % (ref 36.0–46.0)
Hemoglobin: 13.3 g/dL (ref 12.0–15.0)
MCH: 27 pg (ref 26.0–34.0)
MCHC: 33.2 g/dL (ref 30.0–36.0)
MCV: 81.5 fL (ref 78.0–100.0)
RBC: 4.92 MIL/uL (ref 3.87–5.11)

## 2011-03-31 LAB — URINALYSIS, ROUTINE W REFLEX MICROSCOPIC
Bilirubin Urine: NEGATIVE
Glucose, UA: NEGATIVE mg/dL
Hgb urine dipstick: NEGATIVE
Specific Gravity, Urine: 1.017 (ref 1.005–1.030)
Urobilinogen, UA: 0.2 mg/dL (ref 0.0–1.0)
pH: 7.5 (ref 5.0–8.0)

## 2011-03-31 LAB — DIFFERENTIAL
Lymphocytes Relative: 19 % (ref 12–46)
Lymphs Abs: 1.8 10*3/uL (ref 0.7–4.0)
Monocytes Absolute: 0.4 10*3/uL (ref 0.1–1.0)
Monocytes Relative: 5 % (ref 3–12)
Neutro Abs: 7.4 10*3/uL (ref 1.7–7.7)
Neutrophils Relative %: 76 % (ref 43–77)

## 2011-03-31 LAB — COMPREHENSIVE METABOLIC PANEL
ALT: 19 U/L (ref 0–35)
AST: 23 U/L (ref 0–37)
Alkaline Phosphatase: 85 U/L (ref 39–117)
GFR calc Af Amer: >60 mL/min (ref >60)
Glucose, Bld: 98 mg/dL (ref 70–99)
Potassium: 3.7 mEq/L (ref 3.5–5.1)
Sodium: 136 mEq/L (ref 135–145)
Total Protein: 7 g/dL (ref 6.0–8.3)

## 2011-04-02 NOTE — Progress Notes (Signed)
HPI  Patient is a 40 year old with a history of palpitations.  I saw her in [place] in [place].   At that time I stopped the diltiazem she was taking due to complaint s of muscle aches.  Blood work was neg.  I was not convinced of an association. Since seen she has continued to have palpitatons and dizziness.  Monitor in the past showed only short burst of atrial tachycardia.   She has just picked up another monitor.  She remains on metoprolol.  She denies syncope.  She says that she was just diagnosed as having fibromyalgia.  On medicine for this.  Not sure name.   Allergies  Allergen Reactions  . Morphine     Current Outpatient Prescriptions  Medication Sig Dispense Refill  . albuterol (VENTOLIN HFA) 108 (90 BASE) MCG/ACT inhaler Inhale 2 puffs into the lungs every 6 (six) hours as needed.        . Cetirizine HCl 10 MG CAPS Take 1 capsule by mouth daily.        Marland Kitchen dexlansoprazole (DEXILANT) 60 MG capsule Take 60 mg by mouth daily.        . diclofenac (VOLTAREN) 75 MG EC tablet Take 75 mg by mouth 2 (two) times daily.        Marland Kitchen diltiazem (CARDIZEM CD) 180 MG 24 hr capsule Take 180 mg by mouth daily.        . fluticasone (FLONASE) 50 MCG/ACT nasal spray Place 2 sprays into the nose daily.        Marland Kitchen ketoconazole (NIZORAL) 2 % shampoo Apply 1 application topically 3 (three) times a week.        . metoprolol succinate (TOPROL-XL) 25 MG 24 hr tablet Take 25 mg by mouth daily.        . promethazine (PHENERGAN) 25 MG tablet Take 25 mg by mouth every 8 (eight) hours as needed.        . venlafaxine (EFFEXOR-XR) 150 MG 24 hr capsule Take 150 mg by mouth daily.          Past Medical History  Diagnosis Date  . Pneumonia     with cavitation of left lower lobe  . Asthma   . Migraine   . Hot flashes   . Palpitations   . Chest pressure     Past Surgical History  Procedure Date  . Tubal ligation 1993    Family History  Problem Relation Age of Onset  . Migraines Daughter   . Migraines  Son   . Hypertension      History   Social History  . Marital Status: Single    Spouse Name: N/A    Number of Children: N/A  . Years of Education: N/A   Occupational History  . Not on file.   Social History Main Topics  . Smoking status: Former Smoker    Types: Cigarettes  . Smokeless tobacco: Not on file   Comment: quit last month  . Alcohol Use: No  . Drug Use: No  . Sexually Active: Not on file   Other Topics Concern  . Not on file   Social History Narrative  . No narrative on file    Review of Systems:  All systems reviewed.  They are negative to the above problem except as previously stated.  Vital Signs: BP 121/81  Pulse 57  Resp 16  Ht 5\' 5"  (1.651 m)  Wt 155 lb (70.308 kg)  BMI 25.79 kg/m2  LMP 03/15/2011  Physical Exam  Patient is in NAD  HEENT:  Normocephalic, atraumatic. EOMI, PERRLA.  Neck: JVP is normal. No thyromegaly. No bruits.  Lungs: clear to auscultation. No rales no wheezes.  Heart: Regular rate and rhythm. Normal S1, S2. No S3.   No significant murmurs. PMI not displaced.  Abdomen:  Supple, nontender. Normal bowel sounds. No masses. No hepatomegaly.  Extremities:   Good distal pulses throughout. No lower extremity edema.  Musculoskeletal :moving all extremities.  Neuro:   alert and oriented x3.  CN II-XII grossly intact.   Assessment and Plan:

## 2011-04-06 NOTE — Assessment & Plan Note (Signed)
Recently diagnosed with fibromyalgia.  [facility]oll[facility] at [facility].  On new med  Not clear which one.

## 2011-04-06 NOTE — Assessment & Plan Note (Signed)
I wold keep on same regimen.  Await monitor results.

## 2011-05-03 ENCOUNTER — Emergency Department (HOSPITAL_COMMUNITY): Payer: Self-pay

## 2011-05-03 ENCOUNTER — Encounter (HOSPITAL_COMMUNITY): Payer: Self-pay | Admitting: Radiology

## 2011-05-03 ENCOUNTER — Emergency Department (HOSPITAL_COMMUNITY)
Admission: EM | Admit: 2011-05-03 | Discharge: 2011-05-03 | Disposition: A | Discharge status: Home / Self Care | Payer: Self-pay | Attending: Emergency Medicine | Admitting: Emergency Medicine

## 2011-05-03 DIAGNOSIS — R10813 Right lower quadrant abdominal tenderness: Secondary | ICD-10-CM | POA: Insufficient documentation

## 2011-05-03 DIAGNOSIS — R112 Nausea with vomiting, unspecified: Secondary | ICD-10-CM | POA: Insufficient documentation

## 2011-05-03 DIAGNOSIS — IMO0001 Reserved for inherently not codable concepts without codable children: Secondary | ICD-10-CM | POA: Insufficient documentation

## 2011-05-03 DIAGNOSIS — D259 Leiomyoma of uterus, unspecified: Secondary | ICD-10-CM | POA: Insufficient documentation

## 2011-05-03 DIAGNOSIS — R1031 Right lower quadrant pain: Secondary | ICD-10-CM | POA: Insufficient documentation

## 2011-05-03 DIAGNOSIS — R197 Diarrhea, unspecified: Secondary | ICD-10-CM | POA: Insufficient documentation

## 2011-05-03 DIAGNOSIS — Z79899 Other long term (current) drug therapy: Secondary | ICD-10-CM | POA: Insufficient documentation

## 2011-05-03 DIAGNOSIS — I2510 Atherosclerotic heart disease of native coronary artery without angina pectoris: Secondary | ICD-10-CM | POA: Insufficient documentation

## 2011-05-03 DIAGNOSIS — J45909 Unspecified asthma, uncomplicated: Secondary | ICD-10-CM | POA: Insufficient documentation

## 2011-05-03 DIAGNOSIS — K219 Gastro-esophageal reflux disease without esophagitis: Secondary | ICD-10-CM | POA: Insufficient documentation

## 2011-05-03 DIAGNOSIS — N898 Other specified noninflammatory disorders of vagina: Secondary | ICD-10-CM | POA: Insufficient documentation

## 2011-05-03 LAB — URINALYSIS, ROUTINE W REFLEX MICROSCOPIC
Glucose, UA: NEGATIVE mg/dL
Ketones, ur: NEGATIVE mg/dL
Leukocytes, UA: NEGATIVE
Nitrite: NEGATIVE
Protein, ur: NEGATIVE mg/dL

## 2011-05-03 LAB — CBC
MCHC: 33.2 g/dL (ref 30.0–36.0)
MCV: 82 fL (ref 78.0–100.0)
Platelets: 210 10*3/uL (ref 150–400)
RDW: 14.8 % (ref 11.5–15.5)
WBC: 8 10*3/uL (ref 4.0–10.5)

## 2011-05-03 LAB — DIFFERENTIAL
Eosinophils Absolute: 0.1 10*3/uL (ref 0.0–0.7)
Eosinophils Relative: 1 % (ref 0–5)
Lymphs Abs: 3.7 10*3/uL (ref 0.7–4.0)

## 2011-05-03 LAB — COMPREHENSIVE METABOLIC PANEL
AST: 17 U/L (ref 0–37)
Albumin: 3.8 g/dL (ref 3.5–5.2)
Calcium: 9.3 mg/dL (ref 8.4–10.5)
Chloride: 103 mEq/L (ref 96–112)
Creatinine, Ser: 0.54 mg/dL (ref 0.50–1.10)
Total Protein: 7.2 g/dL (ref 6.0–8.3)

## 2011-05-03 LAB — WET PREP, GENITAL
Clue Cells Wet Prep HPF POC: NONE SEEN
Trich, Wet Prep: NONE SEEN
Yeast Wet Prep HPF POC: NONE SEEN

## 2011-05-03 LAB — PREGNANCY, URINE: Preg Test, Ur: NEGATIVE

## 2011-05-03 MED ORDER — IOHEXOL 300 MG/ML  SOLN
100.0000 mL | Freq: Once | INTRAMUSCULAR | Status: AC | PRN
  Administered 2011-05-03: 100 mL via INTRAVENOUS

## 2011-05-12 ENCOUNTER — Telehealth: Payer: Self-pay | Admitting: *Deleted

## 2011-05-12 DIAGNOSIS — I471 Supraventricular tachycardia: Secondary | ICD-10-CM

## 2011-05-12 NOTE — Telephone Encounter (Signed)
Called patient about the results of the event monitor which showed short bursts of ST/possible SVT. Dr.Ross would like her to start Diltiazem CD 120mg . She states that she is waiting to get her Medicaid card back so she can pay for her medication. She thinks this will happen soon. She states that she will call me back with the pharmacy contact information when she gets her card again.

## 2011-05-15 MED ORDER — DILTIAZEM HCL ER 120 MG PO CP12: 120.0000 mg | ORAL_CAPSULE | Freq: Every day | ORAL | Status: DC

## 2011-05-15 NOTE — Telephone Encounter (Signed)
Called patient back. She would like Dr.Ross to write a letter( to whom it may concern) what her diagnosis is and the reason for each of her cardiac medications  ( metoprolol and diltiazem). She would like it mailed to her home address and then she will forward to her lawyer. Will send script in the mail for Diltiazem CD 120 so she can take it to the [facility] center to be filled

## 2011-05-15 NOTE — Telephone Encounter (Signed)
Per pt call, pt needs a letter for pt lawyer stating what pt RX's are and why pt takes them. Please return pt call to further clarify.

## 2011-05-16 ENCOUNTER — Emergency Department (HOSPITAL_COMMUNITY)
Admission: EM | Admit: 2011-05-16 | Discharge: 2011-05-16 | Disposition: A | Discharge status: Home / Self Care | Payer: Self-pay | Attending: Emergency Medicine | Admitting: Emergency Medicine

## 2011-05-16 DIAGNOSIS — J45909 Unspecified asthma, uncomplicated: Secondary | ICD-10-CM | POA: Insufficient documentation

## 2011-05-16 DIAGNOSIS — D259 Leiomyoma of uterus, unspecified: Secondary | ICD-10-CM | POA: Insufficient documentation

## 2011-05-16 DIAGNOSIS — I2510 Atherosclerotic heart disease of native coronary artery without angina pectoris: Secondary | ICD-10-CM | POA: Insufficient documentation

## 2011-05-16 DIAGNOSIS — K219 Gastro-esophageal reflux disease without esophagitis: Secondary | ICD-10-CM | POA: Insufficient documentation

## 2011-05-16 DIAGNOSIS — IMO0001 Reserved for inherently not codable concepts without codable children: Secondary | ICD-10-CM | POA: Insufficient documentation

## 2011-05-16 DIAGNOSIS — R109 Unspecified abdominal pain: Secondary | ICD-10-CM | POA: Insufficient documentation

## 2011-05-16 LAB — URINALYSIS, ROUTINE W REFLEX MICROSCOPIC
Bilirubin Urine: NEGATIVE
Glucose, UA: NEGATIVE mg/dL
Ketones, ur: NEGATIVE mg/dL
Specific Gravity, Urine: 1.018 (ref 1.005–1.030)
pH: 6.5 (ref 5.0–8.0)

## 2011-05-22 ENCOUNTER — Telehealth: Payer: Self-pay | Admitting: Internal Medicine

## 2011-05-22 ENCOUNTER — Encounter: Payer: Self-pay | Admitting: Internal Medicine

## 2011-05-22 NOTE — Telephone Encounter (Signed)
Called patient and advised that letter and script for Cardizem have been mailed to her home address per her request.

## 2011-05-22 NOTE — Telephone Encounter (Signed)
Letter completed by Dr.Ross and mailed to patient's home address.

## 2011-05-22 NOTE — Telephone Encounter (Signed)
Calling back to check on the status of new medication.

## 2011-05-24 ENCOUNTER — Encounter: Payer: Self-pay | Admitting: *Deleted

## 2011-05-24 ENCOUNTER — Telehealth: Payer: Self-pay | Admitting: Internal Medicine

## 2011-05-24 NOTE — Telephone Encounter (Signed)
wondreing if her letter and rx has been mailed

## 2011-05-24 NOTE — Telephone Encounter (Signed)
Pt is calling re a letter stating her heart condition. Pt would like to talk to a nurse.

## 2011-05-24 NOTE — Telephone Encounter (Signed)
Spoke with patient and let her know that the letter had been mailed and she also asked if her records had been sent to an attorney  I let her know that I did not see a request from an attorney in her file and that she would need to contact them and have them request information  I explained to her that she has to sign a release of information in order for our office to send records

## 2011-06-15 LAB — DIFFERENTIAL
Basophils Absolute: 0
Eosinophils Relative: 1
Lymphocytes Relative: 54 — ABNORMAL HIGH
Lymphs Abs: 3.2
Neutro Abs: 2.4

## 2011-06-15 LAB — POCT PREGNANCY, URINE
Operator id: 284251
Preg Test, Ur: NEGATIVE

## 2011-06-15 LAB — URINALYSIS, ROUTINE W REFLEX MICROSCOPIC
Bilirubin Urine: NEGATIVE
Hgb urine dipstick: NEGATIVE
Nitrite: NEGATIVE
Specific Gravity, Urine: 1.026
pH: 5.5

## 2011-06-15 LAB — CBC
HCT: 38.7
Hemoglobin: 12.7
MCHC: 32.9
MCV: 81.3
Platelets: 107 — ABNORMAL LOW
RBC: 4.75
RDW: 13.9
WBC: 6

## 2011-06-15 LAB — I-STAT 8, (EC8 V) (CONVERTED LAB)
BUN: 8
Bicarbonate: 22.1
Chloride: 110
Glucose, Bld: 109 — ABNORMAL HIGH
HCT: 40
Hemoglobin: 13.6
Operator id: 284251
Potassium: 4.8
Sodium: 136
TCO2: 23
pCO2, Ven: 28.9 — ABNORMAL LOW
pH, Ven: 7.493 — ABNORMAL HIGH

## 2011-06-15 LAB — POCT I-STAT CREATININE: Operator id: 284251

## 2011-06-20 LAB — RAPID STREP SCREEN (MED CTR MEBANE ONLY): Streptococcus, Group A Screen (Direct): NEGATIVE

## 2011-06-21 ENCOUNTER — Encounter (HOSPITAL_COMMUNITY): Payer: Self-pay

## 2011-06-21 ENCOUNTER — Inpatient Hospital Stay (HOSPITAL_COMMUNITY)
Admission: AD | Admit: 2011-06-21 | Discharge: 2011-06-21 | Disposition: A | Discharge status: Home / Self Care | Payer: Self-pay | Source: Ambulatory Visit | Attending: Family Medicine | Admitting: Family Medicine

## 2011-06-21 DIAGNOSIS — R109 Unspecified abdominal pain: Secondary | ICD-10-CM | POA: Insufficient documentation

## 2011-06-21 DIAGNOSIS — D251 Intramural leiomyoma of uterus: Secondary | ICD-10-CM

## 2011-06-21 DIAGNOSIS — D259 Leiomyoma of uterus, unspecified: Secondary | ICD-10-CM | POA: Insufficient documentation

## 2011-06-21 LAB — URINALYSIS, ROUTINE W REFLEX MICROSCOPIC
Bilirubin Urine: NEGATIVE
Ketones, ur: NEGATIVE mg/dL
Nitrite: NEGATIVE
Protein, ur: NEGATIVE mg/dL
Specific Gravity, Urine: 1.02 (ref 1.005–1.030)
Urobilinogen, UA: 0.2 mg/dL (ref 0.0–1.0)

## 2011-06-21 LAB — URINE MICROSCOPIC-ADD ON

## 2011-06-21 MED ORDER — KETOROLAC TROMETHAMINE 60 MG/2ML IM SOLN
60.0000 mg | Freq: Once | INTRAMUSCULAR | Status: AC
  Administered 2011-06-21: 60 mg via INTRAMUSCULAR
  Filled 2011-06-21: qty 2

## 2011-06-21 NOTE — Progress Notes (Signed)
Pt states has multiple health issues, unable to afford many of her meds, having surgery at some point for fibroid/cyst removal from ovaries. LMP-06/18/2011, clear thin vag d/c noted, non-odorous.

## 2011-06-21 NOTE — ED Provider Notes (Signed)
History     Chief Complaint  Patient presents with  . Abdominal Pain   HPI Katherine [name] 40 y.o. comes to [facility]U today due to lower abdominal pain.  Has known fibroids.  Period started on 06-18-11.  She had severe pain on Tuesday, but did not take any medication.  Today she took 2 pills for pain (?? Ibuprofen ??) at 9 am.  The pills did not help and she does not have any additional medication.  Has an appointment in GYN clinic for evaluation - unsure of the date.  Does not have Medicaid currently.  Has previously been seen at [facility] but does not have any appointments scheduled.   Does not have a current orange card.  Has not reapplied for orange card.  Past Medical History  Diagnosis Date  . Pneumonia     with cavitation of left lower lobe  . Asthma   . Migraine   . Hot flashes   . Palpitations   . Chest pressure     Past Surgical History  Procedure Date  . Tubal ligation 1993    Family History  Problem Relation Age of Onset  . Migraines Daughter   . Migraines Son   . Hypertension      History  Substance Use Topics  . Smoking status: Current Everyday Smoker -- 0.0 packs/day    Types: Cigarettes  . Smokeless tobacco: Never Used   Comment: quit last month  . Alcohol Use: No    Allergies:  Allergies  Allergen Reactions  . Hydrocodone Hives  . Morphine Hives    Prescriptions prior to admission  Medication Sig Dispense Refill  . diltiazem (CARDIZEM SR) 120 MG 12 hr capsule Take 1 capsule (120 mg total) by mouth daily.  30 capsule  6  . fluticasone (FLONASE) 50 MCG/ACT nasal spray Place 2 sprays into the nose daily.        Marland Kitchen albuterol (VENTOLIN HFA) 108 (90 BASE) MCG/ACT inhaler Inhale 2 puffs into the lungs every 6 (six) hours as needed.        . Cetirizine HCl 10 MG CAPS Take 1 capsule by mouth daily.       Marland Kitchen dexlansoprazole (DEXILANT) 60 MG capsule Take 60 mg by mouth daily.        . diclofenac (VOLTAREN) 75 MG EC tablet Take 75 mg by mouth 2 (two) times daily.         Marland Kitchen ketoconazole (NIZORAL) 2 % shampoo Apply 1 application topically 3 (three) times a week.        . metoprolol succinate (TOPROL-XL) 25 MG 24 hr tablet Take 25 mg by mouth daily.        . promethazine (PHENERGAN) 25 MG tablet Take 25 mg by mouth every 8 (eight) hours as needed.        . venlafaxine (EFFEXOR-XR) 150 MG 24 hr capsule Take 150 mg by mouth daily.          Review of Systems  Gastrointestinal: Positive for abdominal pain. Negative for nausea and vomiting.  Genitourinary: Positive for frequency.   Physical Exam   Blood pressure 130/81, pulse 61, temperature 99.2 F (37.3 C), temperature source Oral, resp. rate 16, height 5' 4.25" (1.632 m), weight 156 lb 4 oz (70.875 kg), last menstrual period 06/18/2011.  Physical Exam  Nursing note and vitals reviewed. Constitutional: She is oriented to person, place, and time. She appears well-developed and well-nourished.  HENT:  Head: Normocephalic.  Eyes: EOM are normal.  Neck: Neck supple.  GI: Soft. Bowel sounds are normal. There is tenderness. There is no rebound and no guarding.       Mild tenderness all across lower abdomen  Musculoskeletal: Normal range of motion.  Neurological: She is alert and oriented to person, place, and time.  Skin: Skin is warm and dry.  Psychiatric: She has a normal mood and affect.    MAU Course  Procedures Results for orders placed during the hospital encounter of 06/21/11 (from the past 24 hour(s))  URINALYSIS, ROUTINE W REFLEX MICROSCOPIC     Status: Abnormal   Collection Time   06/21/11  1:50 PM      Component Value Range   Color, Urine YELLOW  YELLOW    Appearance CLEAR  CLEAR    Specific Gravity, Urine 1.020  1.005 - 1.030    pH 6.0  5.0 - 8.0    Glucose, UA NEGATIVE  NEGATIVE (mg/dL)   Hgb urine dipstick LARGE (*) NEGATIVE    Bilirubin Urine NEGATIVE  NEGATIVE    Ketones, ur NEGATIVE  NEGATIVE (mg/dL)   Protein, ur NEGATIVE  NEGATIVE (mg/dL)   Urobilinogen, UA 0.2  0.0 - 1.0  (mg/dL)   Nitrite NEGATIVE  NEGATIVE    Leukocytes, UA NEGATIVE  NEGATIVE   URINE MICROSCOPIC-ADD ON     Status: Abnormal   Collection Time   06/21/11  1:50 PM      Component Value Range   Squamous Epithelial / LPF FEW (*) RARE    RBC / HPF 3-6  <3 (RBC/hpf)  POCT PREGNANCY, URINE     Status: Normal   Collection Time   06/21/11  1:57 PM      Component Value Range   Preg Test, Ur NEGATIVE      MDM Reviewed old charts.  This is the third visit to an ER since April 26, 2011.  Will give Toradol 60 mg IM for pain as client is currently on her menses and increased pain would be expected this week.  Discussed at length pain management for her.  With very limited funds and no insurance, advised to get generic ibuprofen and take by the package directions.  Advised that it is not expected to completely eliminate her pain, but to decrease the pain.  Advised to keep her GYN clinic appointment so a management plan can be discussed.  Assessment and Plan  Abdominal pain with uterine fibroids  Plan: Continue to apply for Medicaid or your [employer]. Get some generic ibuprofen at [facility] or [employer] and take by the package directions for pain. Keep your appointment in the GYN clinic Your appointment is: THURSDAY, NOVEMEBER 1 AT 2:15 PM WITH DR. DOVE Return if you have severe pain or worsening symptoms.   Auron Tadros 06/21/2011, 3:04 PM   Nolene Bernheim, NP 06/21/11 1549

## 2011-06-21 NOTE — ED Provider Notes (Signed)
Chart reviewed and agree with management and plan.  

## 2011-06-22 LAB — URINE MICROSCOPIC-ADD ON

## 2011-06-22 LAB — URINALYSIS, ROUTINE W REFLEX MICROSCOPIC
Nitrite: NEGATIVE
Protein, ur: NEGATIVE
Specific Gravity, Urine: 1.017
Urobilinogen, UA: 0.2

## 2011-06-22 LAB — WET PREP, GENITAL
Clue Cells Wet Prep HPF POC: NONE SEEN
Yeast Wet Prep HPF POC: NONE SEEN

## 2011-06-22 LAB — POCT PREGNANCY, URINE
Operator id: 146091
Preg Test, Ur: NEGATIVE

## 2011-06-22 LAB — GC/CHLAMYDIA PROBE AMP, GENITAL: GC Probe Amp, Genital: NEGATIVE

## 2011-06-26 LAB — DIFFERENTIAL
Basophils Absolute: 0.1
Basophils Relative: 1
Eosinophils Absolute: 0.2
Eosinophils Relative: 3
Monocytes Absolute: 0.5

## 2011-06-26 LAB — URINALYSIS, ROUTINE W REFLEX MICROSCOPIC
Glucose, UA: NEGATIVE
Hgb urine dipstick: NEGATIVE
Ketones, ur: NEGATIVE
Protein, ur: NEGATIVE
Urobilinogen, UA: 0.2

## 2011-06-26 LAB — CBC
HCT: 40
Hemoglobin: 12.9
MCHC: 32.2
MCV: 84
Platelets: 196
RDW: 14.1

## 2011-06-26 LAB — GC/CHLAMYDIA PROBE AMP, GENITAL: GC Probe Amp, Genital: NEGATIVE

## 2011-06-26 LAB — WET PREP, GENITAL: Trich, Wet Prep: NONE SEEN

## 2011-06-30 ENCOUNTER — Ambulatory Visit: Payer: Self-pay | Admitting: Obstetrics & Gynecology

## 2011-06-30 ENCOUNTER — Encounter: Payer: Self-pay | Admitting: Obstetrics & Gynecology

## 2011-07-05 LAB — POCT PREGNANCY, URINE
Operator id: 257131
Preg Test, Ur: NEGATIVE

## 2011-07-05 LAB — URINALYSIS, ROUTINE W REFLEX MICROSCOPIC
Glucose, UA: NEGATIVE
Protein, ur: NEGATIVE
Specific Gravity, Urine: 1.023
pH: 7.5

## 2011-07-07 LAB — URINALYSIS, ROUTINE W REFLEX MICROSCOPIC
Hgb urine dipstick: NEGATIVE
Nitrite: NEGATIVE
Protein, ur: NEGATIVE
Urobilinogen, UA: 1

## 2011-07-07 LAB — POCT PREGNANCY, URINE: Preg Test, Ur: NEGATIVE

## 2011-07-27 ENCOUNTER — Encounter: Payer: Self-pay | Admitting: Obstetrics & Gynecology

## 2011-07-27 ENCOUNTER — Other Ambulatory Visit (HOSPITAL_COMMUNITY)
Admission: RE | Admit: 2011-07-27 | Discharge: 2011-07-27 | Disposition: A | Discharge status: Home / Self Care | Payer: Self-pay | Source: Ambulatory Visit | Attending: Obstetrics & Gynecology | Admitting: Obstetrics & Gynecology

## 2011-07-27 ENCOUNTER — Ambulatory Visit (INDEPENDENT_AMBULATORY_CARE_PROVIDER_SITE_OTHER): Payer: Self-pay | Admitting: Obstetrics & Gynecology

## 2011-07-27 ENCOUNTER — Ambulatory Visit: Payer: Self-pay | Admitting: Obstetrics & Gynecology

## 2011-07-27 DIAGNOSIS — Z113 Encounter for screening for infections with a predominantly sexual mode of transmission: Secondary | ICD-10-CM | POA: Insufficient documentation

## 2011-07-27 DIAGNOSIS — Z01419 Encounter for gynecological examination (general) (routine) without abnormal findings: Secondary | ICD-10-CM | POA: Insufficient documentation

## 2011-07-27 DIAGNOSIS — Z0000 Encounter for general adult medical examination without abnormal findings: Secondary | ICD-10-CM

## 2011-07-27 DIAGNOSIS — D219 Benign neoplasm of connective and other soft tissue, unspecified: Secondary | ICD-10-CM

## 2011-07-27 DIAGNOSIS — D259 Leiomyoma of uterus, unspecified: Secondary | ICD-10-CM

## 2011-07-27 NOTE — Progress Notes (Signed)
  Subjective:    Patient ID: Emiliano Dyer, female    DOB: 12-23-1970, 41 y.o.   MRN: [id number]  HPI Miss Alwyn Ren is a 40 yo single AA G2P2 who has had a long history of pelvic pain and dysparunia.  She says that sex has become so painful that she hasn't had sex in 6 months.  She was seen at [facility] 05-04-11 with the complaint of pelvic pain. A CT and u/s were done.  A 4.9 cm left fibroid was seen.   Review of Systems Her largest baby was about 5 pounds  She is currently seeing a lawyer trying to get disability for her multiple aches and pains. She has been living with her boyfriend for the last 12 years. Objective:   Physical Exam  Heart - rrr without m,r,g Lungs- CTAB Abd- benigh Pelvic-  Narrow pubic arch, stenotic cervix, normal discharge, no cmt, uterus 8 week size, minimally mobile,  Adnexa- non-tender and no masses      Assessment & Plan:  Preventative care- She declines a flu shot. I have done a pap smear and ordered a mammogram. Symptomatic fibroid- I have offered her a TAH versus robotic hysterectomy versus depo Lupron.  After a long discussion, she and her significant other have chosen Lupron.

## 2011-07-31 ENCOUNTER — Other Ambulatory Visit (HOSPITAL_COMMUNITY): Payer: Self-pay | Admitting: Family Medicine

## 2011-07-31 DIAGNOSIS — M25511 Pain in right shoulder: Secondary | ICD-10-CM

## 2011-08-10 ENCOUNTER — Inpatient Hospital Stay (HOSPITAL_COMMUNITY): Admission: RE | Admit: 2011-08-10 | Payer: Self-pay | Source: Ambulatory Visit

## 2011-08-11 ENCOUNTER — Ambulatory Visit (HOSPITAL_COMMUNITY)
Admission: RE | Admit: 2011-08-11 | Discharge: 2011-08-11 | Disposition: A | Discharge status: Home / Self Care | Payer: Self-pay | Source: Ambulatory Visit | Attending: Family Medicine | Admitting: Family Medicine

## 2011-08-11 DIAGNOSIS — M25619 Stiffness of unspecified shoulder, not elsewhere classified: Secondary | ICD-10-CM | POA: Insufficient documentation

## 2011-08-11 DIAGNOSIS — M67919 Unspecified disorder of synovium and tendon, unspecified shoulder: Secondary | ICD-10-CM | POA: Insufficient documentation

## 2011-08-11 DIAGNOSIS — M25519 Pain in unspecified shoulder: Secondary | ICD-10-CM | POA: Insufficient documentation

## 2011-08-11 DIAGNOSIS — M719 Bursopathy, unspecified: Secondary | ICD-10-CM | POA: Insufficient documentation

## 2011-09-12 ENCOUNTER — Ambulatory Visit: Payer: Self-pay | Attending: Internal Medicine

## 2011-09-12 DIAGNOSIS — M25519 Pain in unspecified shoulder: Secondary | ICD-10-CM | POA: Insufficient documentation

## 2011-09-12 DIAGNOSIS — IMO0001 Reserved for inherently not codable concepts without codable children: Secondary | ICD-10-CM | POA: Insufficient documentation

## 2011-09-12 DIAGNOSIS — M2550 Pain in unspecified joint: Secondary | ICD-10-CM | POA: Insufficient documentation

## 2011-09-12 DIAGNOSIS — M25619 Stiffness of unspecified shoulder, not elsewhere classified: Secondary | ICD-10-CM | POA: Insufficient documentation

## 2011-09-27 ENCOUNTER — Ambulatory Visit: Payer: Self-pay | Attending: Internal Medicine | Admitting: Rehabilitation

## 2011-09-27 DIAGNOSIS — M2550 Pain in unspecified joint: Secondary | ICD-10-CM | POA: Insufficient documentation

## 2011-09-27 DIAGNOSIS — IMO0001 Reserved for inherently not codable concepts without codable children: Secondary | ICD-10-CM | POA: Insufficient documentation

## 2011-09-27 DIAGNOSIS — M25519 Pain in unspecified shoulder: Secondary | ICD-10-CM | POA: Insufficient documentation

## 2011-09-27 DIAGNOSIS — M25619 Stiffness of unspecified shoulder, not elsewhere classified: Secondary | ICD-10-CM | POA: Insufficient documentation

## 2011-09-29 ENCOUNTER — Ambulatory Visit: Payer: Medicaid Other | Admitting: Rehabilitation

## 2011-10-05 ENCOUNTER — Ambulatory Visit: Payer: Medicaid Other

## 2011-10-10 ENCOUNTER — Ambulatory Visit: Payer: Medicaid Other

## 2011-10-12 ENCOUNTER — Ambulatory Visit: Payer: Medicaid Other

## 2011-10-16 ENCOUNTER — Ambulatory Visit: Payer: Medicaid Other | Admitting: Physical Therapy

## 2011-10-20 ENCOUNTER — Ambulatory Visit: Payer: Medicaid Other | Admitting: Physical Therapy

## 2011-12-17 ENCOUNTER — Encounter (HOSPITAL_COMMUNITY): Payer: Self-pay | Admitting: Emergency Medicine

## 2011-12-17 ENCOUNTER — Emergency Department (HOSPITAL_COMMUNITY)
Admission: EM | Admit: 2011-12-17 | Discharge: 2011-12-17 | Disposition: A | Discharge status: Home / Self Care | Payer: Self-pay | Attending: Emergency Medicine | Admitting: Emergency Medicine

## 2011-12-17 DIAGNOSIS — F411 Generalized anxiety disorder: Secondary | ICD-10-CM | POA: Insufficient documentation

## 2011-12-17 DIAGNOSIS — R6883 Chills (without fever): Secondary | ICD-10-CM | POA: Insufficient documentation

## 2011-12-17 DIAGNOSIS — Z79899 Other long term (current) drug therapy: Secondary | ICD-10-CM | POA: Insufficient documentation

## 2011-12-17 DIAGNOSIS — IMO0001 Reserved for inherently not codable concepts without codable children: Secondary | ICD-10-CM | POA: Insufficient documentation

## 2011-12-17 DIAGNOSIS — N644 Mastodynia: Secondary | ICD-10-CM | POA: Insufficient documentation

## 2011-12-17 DIAGNOSIS — J029 Acute pharyngitis, unspecified: Secondary | ICD-10-CM | POA: Insufficient documentation

## 2011-12-17 DIAGNOSIS — R079 Chest pain, unspecified: Secondary | ICD-10-CM | POA: Insufficient documentation

## 2011-12-17 DIAGNOSIS — J45909 Unspecified asthma, uncomplicated: Secondary | ICD-10-CM | POA: Insufficient documentation

## 2011-12-17 DIAGNOSIS — F17200 Nicotine dependence, unspecified, uncomplicated: Secondary | ICD-10-CM | POA: Insufficient documentation

## 2011-12-17 DIAGNOSIS — N63 Unspecified lump in unspecified breast: Secondary | ICD-10-CM | POA: Insufficient documentation

## 2011-12-17 HISTORY — DX: Fibromyalgia: M79.7

## 2011-12-17 LAB — RAPID STREP SCREEN (MED CTR MEBANE ONLY): Streptococcus, Group A Screen (Direct): NEGATIVE

## 2011-12-17 MED ORDER — TRAMADOL HCL 50 MG PO TABS: 50.0000 mg | ORAL_TABLET | Freq: Four times a day (QID) | ORAL | Status: AC | PRN

## 2011-12-17 MED ORDER — TRAMADOL HCL 50 MG PO TABS
50.0000 mg | ORAL_TABLET | Freq: Once | ORAL | Status: AC
  Administered 2011-12-17: 50 mg via ORAL
  Filled 2011-12-17: qty 1

## 2011-12-17 NOTE — Discharge Instructions (Signed)
Breast Cyst A breast cyst is a sac in your breast that is filled with fluid. This is common in women. Women can have one or many cysts. When the breasts contain many cysts, it is usually due to a noncancerous (benign) condition called fibrocystic change. These lumps form under the influence of female hormones (estrogen and progesterone). The lumps are most often located in the upper, outer portion of the breast. They are often more swollen, painful, and tender before your period starts. They usually disappear after menopause, unless you are on hormone therapy. Different types of cysts:  Macrocysts. These are cysts that are about 2 inches (5.1 cm) in diameter.   Microcysts. These are tiny cysts that you cannot feel, but that are seen with a mammogram or an ultrasound.   Galactocele. This is a cyst containing milk, which develops when and if you suddenly stop breastfeeding.   Sebaceous cyst of the skin (not in the breast tissue itself). These are not cancerous.  Breast cysts do not increase your chance of getting breast cancer. However, they must be followed and treated closely, because a cyst can be cancerous. Be sure to see your caregiver for follow-up care as recommended.  CAUSES   It is not completely known what causes a breast cyst.   Estrogen may influence the development of a breast cyst.   An overgrowth of milk glands and connective tissue in the breast can block the milk glands, causing them to fill with fluid.   Scar tissue in the breast from previous surgery may block the glands, causing a cyst.  SYMPTOMS   Feeling a smooth, round, soft lump (like a grape) in the breast that is easily moveable.   Breast discomfort or pain, especially in the area of the cyst.   Increase in size of the lump before your menstrual period, and decrease in size after your menstrual period.  DIAGNOSIS   The cyst can be felt during an exam by your caregiver.   Mammogram (breast X-ray).    Ultrasound.   Removing fluid from the cyst with a needle (fine needle aspiration).  TREATMENT   Your caregiver may feel there is no reason for treatment. He or she may watch to see if it goes away on its own.   Hormone treatment.   Needle aspiration. There is a 40% chance of the cyst recurring after aspiration.   Surgery to remove the whole cyst.  HOME CARE INSTRUCTIONS   Get a yearly exam by your caregiver.   Practice "breast self-awareness." This means understanding the normal appearance and feel of your breasts and may include breast self-examination.   Have a clinical breast exam (CBE) by a caregiver every 1 to 3 years if you are 20 to 42 years of age. After age 40, you should have a CBE every year.   Get mammogram tests as directed by your caregiver.   Only take over-the-counter pain medicine as directed by your caregiver.   Wear a good support bra, especially when exercising.   Avoid caffeine.   Reduce your salt intake, especially before your menstrual period. Too much salt can cause fluid retention, breast swelling, and discomfort.  SEEK MEDICAL CARE IF:   You feel, or think you feel, a lump in your breast.   You notice that both breasts look different than usual.   You notice that both breasts feel different than before.   Your breast is still causing pain, after your menstrual period is over.     You need medicine for breast pain and swelling that occurs with your menstrual period.  SEEK IMMEDIATE MEDICAL CARE IF:   You develop severe pain, tenderness, redness, or warmth in your breast.   You develop nipple discharge or bleeding.   Your breast lump becomes hard and painful.   You find new lumps or bumps that were not there before.   You feel lumps in your armpit (axilla).   You notice dimpling or wrinkling of the breast or nipple.   You have a fever.  Document Released: 09/11/2005 Document Revised: 08/31/2011 Document Reviewed: 01/01/2009 [facility]  Patient Information 2012 [address], [place].Pharyngitis, Viral and Bacterial Pharyngitis is soreness (inflammation) or infection of the pharynx. It is also called a sore throat. CAUSES  Most sore throats are caused by viruses and are part of a cold. However, some sore throats are caused by strep and other bacteria. Sore throats can also be caused by post nasal drip from draining sinuses, allergies and sometimes from sleeping with an open mouth. Infectious sore throats can be spread from person to person by coughing, sneezing and sharing cups or eating utensils. TREATMENT  Sore throats that are viral usually last 3-4 days. Viral illness will get better without medications (antibiotics). Strep throat and other bacterial infections will usually begin to get better about 24-48 hours after you begin to take antibiotics. HOME CARE INSTRUCTIONS   If the caregiver feels there is a bacterial infection or if there is a positive strep test, they will prescribe an antibiotic. The full course of antibiotics must be taken. If the full course of antibiotic is not taken, you or your child may become ill again. If you or your child has strep throat and do not finish all of the medication, serious heart or kidney diseases may develop.   Drink enough water and fluids to keep your urine clear or pale yellow.   Only take over-the-counter or prescription medicines for pain, discomfort or fever as directed by your caregiver.   Get lots of rest.   Gargle with salt water ( tsp. of salt in a glass of water) as often as every 1-2 hours as you need for comfort.   Hard candies may soothe the throat if individual is not at risk for choking. Throat sprays or lozenges may also be used.  SEEK MEDICAL CARE IF:   Large, tender lumps in the neck develop.   A rash develops.   Green, yellow-brown or bloody sputum is coughed up.   Your baby is older than 3 months with a rectal temperature of 100.5 F (38.1 C) or higher for more  than 1 day.  SEEK IMMEDIATE MEDICAL CARE IF:   A stiff neck develops.   You or your child are drooling or unable to swallow liquids.   You or your child are vomiting, unable to keep medications or liquids down.   You or your child has severe pain, unrelieved with recommended medications.   You or your child are having difficulty breathing (not due to stuffy nose).   You or your child are unable to fully open your mouth.   You or your child develop redness, swelling, or severe pain anywhere on the neck.   You have a fever.   Your baby is older than 3 months with a rectal temperature of 102 F (38.9 C) or higher.   Your baby is 43 months old or younger with a rectal temperature of 100.4 F (38 C) or higher.  MAKE SURE YOU:  Understand these instructions.   Will watch your condition.   Will get help right away if you are not doing well or get worse.  Document Released: 09/11/2005 Document Revised: 08/31/2011 Document Reviewed: 12/09/2007 [facility] Patient Information 2012 [place], [place].   You will need to follow up with your PCP for further evaluation of the breast mass - take the pain medication as directed for pain.

## 2011-12-17 NOTE — ED Provider Notes (Signed)
History     CSN: [id number]  Arrival date & time 12/17/11  1501   First MD Initiated Contact with Patient 12/17/11 1604      Chief Complaint  Patient presents with  . Sore Throat    (Consider location/radiation/quality/duration/timing/severity/associated sxs/prior treatment) HPI Comments: Patient here with two complaints - first she reports a "knot" on her right breast for he past two weeks - she reports that the knot is painful to the touch - she denies fever, chills, drainage from her nipples, induration of the breast tissue.  No history of breast cancer or family history of breast cancer - states her last mammogram was 6 months ago and normal.  She is also here for sore throat for 1 weeks - she again denies fever, chills, swollen glands, cough, congestion, runny nose, states that painful with swallow and that she looked in the back of her throat and noted "blisters"  Patient is a 41 y.o. female presenting with pharyngitis and chest pain. The history is provided by the patient. No language interpreter was used.  Sore Throat This is a new problem. The current episode started in the past 7 days. The problem occurs constantly. The problem has been unchanged. Associated symptoms include chest pain, chills and myalgias. Pertinent negatives include no arthralgias, change in bowel habit, congestion, fatigue, headaches, joint swelling, neck pain, numbness, rash, swollen glands, urinary symptoms, vertigo, visual change or weakness. The symptoms are aggravated by swallowing. She has tried nothing for the symptoms. The treatment provided no relief.  Chest Pain The chest pain began 1 - 2 weeks ago. Chest pain occurs constantly. The chest pain is unchanged. At its most intense, the pain is at 5/10. The pain is currently at 5/10. The severity of the pain is moderate. The quality of the pain is described as aching. The pain does not radiate. Exacerbated by: palpation. Pertinent negatives for primary symptoms  include no fatigue.  Pertinent negatives for associated symptoms include no numbness and no weakness. She tried nothing for the symptoms. Risk factors include no known risk factors.     Past Medical History  Diagnosis Date  . Pneumonia     with cavitation of left lower lobe  . Asthma   . Migraine   . Hot flashes   . Palpitations   . Chest pressure   . Anxiety   . Fibromyalgia     Past Surgical History  Procedure Date  . Tubal ligation 1993    Family History  Problem Relation Age of Onset  . Migraines Daughter   . Migraines Son   . Hypertension    . Hypertension Mother   . Hypertension Father     History  Substance Use Topics  . Smoking status: Current Everyday Smoker -- 0.0 packs/day    Types: Cigarettes  . Smokeless tobacco: Never Used   Comment: quit last month  . Alcohol Use: No    OB History    Grav Para Term Preterm Abortions TAB SAB Ect Mult Living   2 2 1 1      2       Review of Systems  Constitutional: Positive for chills. Negative for fatigue.  HENT: Negative for congestion and neck pain.   Cardiovascular: Positive for chest pain.  Gastrointestinal: Negative for change in bowel habit.  Musculoskeletal: Positive for myalgias. Negative for joint swelling and arthralgias.  Skin: Negative for rash.  Neurological: Negative for vertigo, weakness, numbness and headaches.  All other systems reviewed and are  negative.    Allergies  Hydrocodone and Morphine  Home Medications   Current Outpatient Rx  Name Route Sig Dispense Refill  . ALBUTEROL SULFATE HFA 108 (90 BASE) MCG/ACT IN AERS Inhalation Inhale 2 puffs into the lungs every 6 (six) hours as needed. For shortness of breath    . CETIRIZINE HCL 10 MG PO TABS Oral Take 10 mg by mouth daily.    . DEXLANSOPRAZOLE 60 MG PO CPDR Oral Take 60 mg by mouth daily.      Marland Kitchen DICLOFENAC SODIUM 75 MG PO TBEC Oral Take 75 mg by mouth 2 (two) times daily.      Marland Kitchen FLUTICASONE PROPIONATE 50 MCG/ACT NA SUSP Nasal  Place 2 sprays into the nose daily.      Marland Kitchen METOPROLOL SUCCINATE ER 25 MG PO TB24 Oral Take 25 mg by mouth daily.      Marland Kitchen MIRTAZAPINE 15 MG PO TABS Oral Take 15 mg by mouth at bedtime.    . OMEPRAZOLE 20 MG PO CPDR Oral Take 20 mg by mouth daily.    Marland Kitchen PREGABALIN 75 MG PO CAPS Oral Take 75 mg by mouth 2 (two) times daily.    Marland Kitchen PROMETHAZINE HCL 25 MG PO TABS Oral Take 25 mg by mouth every 8 (eight) hours as needed. For nausea    . VENLAFAXINE HCL ER 150 MG PO CP24 Oral Take 150 mg by mouth daily.      . TRAMADOL HCL 50 MG PO TABS Oral Take 1 tablet (50 mg total) by mouth every 6 (six) hours as needed for pain. 30 tablet 0    BP 113/58  Pulse 56  Temp(Src) 98.3 F (36.8 C) (Oral)  Resp 18  SpO2 100%  LMP 12/09/2011  Physical Exam  Nursing note and vitals reviewed. Constitutional: She is oriented to person, place, and time. She appears well-developed and well-nourished. No distress.  HENT:  Head: Normocephalic and atraumatic.  Right Ear: External ear normal.  Left Ear: External ear normal.  Nose: Nose normal.  Mouth/Throat: Oropharynx is clear and moist. No oropharyngeal exudate.       Mild post pharyngeal erythema  Eyes: Conjunctivae are normal. Pupils are equal, round, and reactive to light. No scleral icterus.  Neck: Normal range of motion. Neck supple.  Cardiovascular: Normal rate, regular rhythm and normal heart sounds.  Exam reveals no gallop and no friction rub.   No murmur heard. Pulmonary/Chest: Effort normal and breath sounds normal. No respiratory distress. She has no wheezes. She has no rales. She exhibits tenderness. Right breast exhibits mass and tenderness. Right breast exhibits no inverted nipple, no nipple discharge and no skin change. Left breast exhibits no inverted nipple, no mass, no nipple discharge, no skin change and no tenderness. Breasts are symmetrical.    Abdominal: Soft. Bowel sounds are normal. She exhibits no distension. There is no tenderness.    Musculoskeletal: Normal range of motion. She exhibits no edema and no tenderness.  Lymphadenopathy:    She has no cervical adenopathy.  Neurological: She is alert and oriented to person, place, and time. No cranial nerve deficit.  Skin: Skin is warm and dry. No rash noted. No erythema. No pallor.  Psychiatric: She has a normal mood and affect. Her behavior is normal. Judgment and thought content normal.    ED Course  Procedures (including critical care time)   Labs Reviewed  RAPID STREP SCREEN   No results found.   1. Breast mass   2. Pharyngitis  MDM  Patient with negative strep and likely viral strep pharyngitis - she has a mobile, painful about 2cm nodule in the breast tissue of the right breast - there is no discharge from the nipple and I suspect this to be cystic in nature, she is however encouraged to follow up with her PCP and the [facility] for a repeat mammogram for this.        Izola Price [place], [place] 12/17/11 [phone]

## 2011-12-17 NOTE — ED Notes (Signed)
C/o sore throat x 1 week and knot on R breast x 1-2 weeks.

## 2011-12-17 NOTE — ED Notes (Signed)
Rapid strept screen sent to lab for testing.

## 2011-12-17 NOTE — ED Notes (Signed)
Pt requested bus pass for herself and her daughter. Bus passes given.

## 2011-12-19 ENCOUNTER — Ambulatory Visit: Payer: Self-pay | Attending: Orthopedic Surgery | Admitting: Rehabilitative and Restorative Service Providers"

## 2011-12-19 DIAGNOSIS — M2550 Pain in unspecified joint: Secondary | ICD-10-CM | POA: Insufficient documentation

## 2011-12-19 DIAGNOSIS — M25519 Pain in unspecified shoulder: Secondary | ICD-10-CM | POA: Insufficient documentation

## 2011-12-19 DIAGNOSIS — IMO0001 Reserved for inherently not codable concepts without codable children: Secondary | ICD-10-CM | POA: Insufficient documentation

## 2011-12-19 DIAGNOSIS — M25619 Stiffness of unspecified shoulder, not elsewhere classified: Secondary | ICD-10-CM | POA: Insufficient documentation

## 2011-12-19 NOTE — ED Provider Notes (Signed)
Medical screening examination/treatment/procedure(s) were performed by non-physician practitioner and as supervising physician I was immediately available for consultation/collaboration.  Gordie Belvin, MD 12/19/11 0658 

## 2011-12-26 ENCOUNTER — Ambulatory Visit: Payer: Self-pay | Attending: Orthopedic Surgery | Admitting: Physical Therapy

## 2011-12-26 DIAGNOSIS — M25519 Pain in unspecified shoulder: Secondary | ICD-10-CM | POA: Insufficient documentation

## 2011-12-26 DIAGNOSIS — IMO0001 Reserved for inherently not codable concepts without codable children: Secondary | ICD-10-CM | POA: Insufficient documentation

## 2011-12-26 DIAGNOSIS — M2550 Pain in unspecified joint: Secondary | ICD-10-CM | POA: Insufficient documentation

## 2011-12-26 DIAGNOSIS — M25619 Stiffness of unspecified shoulder, not elsewhere classified: Secondary | ICD-10-CM | POA: Insufficient documentation

## 2011-12-28 ENCOUNTER — Ambulatory Visit: Payer: Self-pay | Admitting: Physical Therapy

## 2011-12-28 ENCOUNTER — Ambulatory Visit: Payer: Self-pay

## 2012-01-01 ENCOUNTER — Ambulatory Visit (INDEPENDENT_AMBULATORY_CARE_PROVIDER_SITE_OTHER): Payer: Self-pay | Admitting: *Deleted

## 2012-01-01 VITALS — BP 116/73 | HR 71

## 2012-01-01 DIAGNOSIS — D259 Leiomyoma of uterus, unspecified: Secondary | ICD-10-CM

## 2012-01-01 MED ORDER — LEUPROLIDE ACETATE (3 MONTH) 11.25 MG IM KIT
11.2500 mg | PACK | Freq: Once | INTRAMUSCULAR | Status: AC
  Administered 2012-01-01: 11.25 mg via INTRAMUSCULAR

## 2012-01-02 ENCOUNTER — Encounter: Payer: Self-pay | Admitting: Physical Therapy

## 2012-01-04 ENCOUNTER — Encounter: Payer: Self-pay | Admitting: Rehabilitative and Restorative Service Providers"

## 2012-01-09 ENCOUNTER — Ambulatory Visit: Payer: Medicaid Other | Admitting: Physical Therapy

## 2012-01-11 ENCOUNTER — Ambulatory Visit: Payer: Medicaid Other | Admitting: Physical Therapy

## 2012-01-22 ENCOUNTER — Encounter: Payer: Self-pay | Admitting: Obstetrics and Gynecology

## 2012-01-22 ENCOUNTER — Ambulatory Visit (INDEPENDENT_AMBULATORY_CARE_PROVIDER_SITE_OTHER): Payer: Self-pay | Admitting: Obstetrics and Gynecology

## 2012-01-22 VITALS — BP 122/87 | HR 64 | Temp 99.7°F | Ht 64.0 in | Wt 154.4 lb

## 2012-01-22 DIAGNOSIS — Z803 Family history of malignant neoplasm of breast: Secondary | ICD-10-CM

## 2012-01-22 NOTE — Progress Notes (Signed)
Katherine [name] [id number] Chief Complaint  Patient presents with  . Breast Problem    pain & knot in right breast       SUBJECTIVE  HPI: 41 year old G2 P2002 presents with about a one-two month history of feeling a knot in her right breast She indicates that it had been about 3 cm and now she has difficulty locating it. Shetstates it is much smaller but is still there. The area was tender but now is nontender. She had a sister who died of breast cancer at age 19 therefore she has been getting mammograms herself about every 6 months.  Per Dr. Ellin [name] note she was ordering a screening mammogram, but the patient did not get that scheduled.  LMP was heavier than usual 12/31/2011. Has had BTL.  She was seen here by Dr. Marice [name] 1 month ago due to pelvic pain.She had been diagnosed with a 4.9 cm fibroid and the decision was made to start her on Depo-Lupron at less months visit. She states that her pelvic pain is better and that she's not having pain today.  Past Medical History  Diagnosis Date  . Pneumonia     with cavitation of left lower lobe  . Asthma   . Migraine   . Hot flashes   . Palpitations   . Chest pressure   . Anxiety   . Fibromyalgia    Past Surgical History  Procedure Date  . Tubal ligation 1993   History   Social History  . Marital Status: Single    Spouse Name: N/A    Number of Children: N/A  . Years of Education: N/A   Occupational History  . Not on file.   Social History Main Topics  . Smoking status: Current Everyday Smoker -- 0.0 packs/day    Types: Cigarettes  . Smokeless tobacco: Never Used   Comment: quit last month  . Alcohol Use: No  . Drug Use: Yes    Special: Marijuana  . Sexually Active: Yes    Birth Control/ Protection: Surgical   Other Topics Concern  . Not on file   Social History Narrative  . No narrative on file   Current Outpatient Prescriptions on File Prior to Visit  Medication Sig Dispense Refill  . albuterol (VENTOLIN HFA)  108 (90 BASE) MCG/ACT inhaler Inhale 2 puffs into the lungs every 6 (six) hours as needed. For shortness of breath      . cetirizine (ZYRTEC) 10 MG tablet Take 10 mg by mouth daily.      Marland Kitchen dexlansoprazole (DEXILANT) 60 MG capsule Take 60 mg by mouth daily.        . diclofenac (VOLTAREN) 75 MG EC tablet Take 75 mg by mouth 2 (two) times daily.        . fluticasone (FLONASE) 50 MCG/ACT nasal spray Place 2 sprays into the nose daily.        . metoprolol succinate (TOPROL-XL) 25 MG 24 hr tablet Take 25 mg by mouth daily.        . mirtazapine (REMERON) 15 MG tablet Take 15 mg by mouth at bedtime.      Marland Kitchen omeprazole (PRILOSEC) 20 MG capsule Take 20 mg by mouth daily.      . pregabalin (LYRICA) 75 MG capsule Take 75 mg by mouth 2 (two) times daily.      . promethazine (PHENERGAN) 25 MG tablet Take 25 mg by mouth every 8 (eight) hours as needed. For nausea      .  venlafaxine (EFFEXOR-XR) 150 MG 24 hr capsule Take 150 mg by mouth daily.        Marland Kitchen DISCONTD: diltiazem (CARDIZEM SR) 120 MG 12 hr capsule Take 1 capsule (120 mg total) by mouth daily.  30 capsule  6   Allergies  Allergen Reactions  . Codeine Hives  . Hydrocodone Hives  . Morphine Hives and Swelling    ROS: Pertinent items in HPI  OBJECTIVE Blood pressure 122/87, pulse 64, temperature 99.7 F (37.6 C), height 5\' 4"  (1.626 m), weight 154 lb 6.4 oz (70.035 kg), last menstrual period 12/31/2011.  GENERAL: Well-developed, well-nourished female in no acute distress.  BREASTS: No skin changes bilat.  Left breast with no mass, nipple discharge or lymphadenopathy noted. Right breast: pea sized mobile, NT mass vs. mammary tissue at 12 o'clock 2 cm above areolar margin in area where pt noted large knot previously NEURO: Alert and oriented         ASSESSMENT FH breast Ca Possible left breast abnormally decreased in size by hx  PLAN Refer to Katherine Mu, RN, to get dx. Mammogram through  [facility]. She will call pt to schedule.  F/U  2 months for Lupron     Katherine [name] 01/22/2012 3:56 PM

## 2012-01-22 NOTE — Patient Instructions (Signed)

## 2012-03-12 ENCOUNTER — Emergency Department (HOSPITAL_COMMUNITY)
Admission: EM | Admit: 2012-03-12 | Discharge: 2012-03-13 | Disposition: A | Discharge status: Home / Self Care | Payer: Medicaid Other | Attending: Emergency Medicine | Admitting: Emergency Medicine

## 2012-03-12 ENCOUNTER — Encounter (HOSPITAL_COMMUNITY): Payer: Self-pay | Admitting: Physical Medicine and Rehabilitation

## 2012-03-12 DIAGNOSIS — R110 Nausea: Secondary | ICD-10-CM

## 2012-03-12 DIAGNOSIS — R197 Diarrhea, unspecified: Secondary | ICD-10-CM

## 2012-03-12 DIAGNOSIS — R10812 Left upper quadrant abdominal tenderness: Secondary | ICD-10-CM | POA: Insufficient documentation

## 2012-03-12 DIAGNOSIS — R109 Unspecified abdominal pain: Secondary | ICD-10-CM

## 2012-03-12 DIAGNOSIS — D259 Leiomyoma of uterus, unspecified: Secondary | ICD-10-CM | POA: Insufficient documentation

## 2012-03-12 LAB — DIFFERENTIAL
Basophils Absolute: 0 10*3/uL (ref 0.0–0.1)
Basophils Relative: 0 % (ref 0–1)
Eosinophils Absolute: 0.1 10*3/uL (ref 0.0–0.7)
Eosinophils Relative: 2 % (ref 0–5)
Monocytes Absolute: 0.5 10*3/uL (ref 0.1–1.0)
Monocytes Relative: 6 % (ref 3–12)
Neutro Abs: 3 10*3/uL (ref 1.7–7.7)

## 2012-03-12 LAB — CBC
HCT: 36.9 % (ref 36.0–46.0)
Hemoglobin: 12.2 g/dL (ref 12.0–15.0)
MCH: 26.4 pg (ref 26.0–34.0)
MCHC: 33.1 g/dL (ref 30.0–36.0)
RDW: 14 % (ref 11.5–15.5)

## 2012-03-12 LAB — COMPREHENSIVE METABOLIC PANEL
Albumin: 3.9 g/dL (ref 3.5–5.2)
Alkaline Phosphatase: 75 U/L (ref 39–117)
BUN: 10 mg/dL (ref 6–23)
Calcium: 9.6 mg/dL (ref 8.4–10.5)
Creatinine, Ser: 0.55 mg/dL (ref 0.50–1.10)
GFR calc Af Amer: >90 mL/min (ref >90)
Glucose, Bld: 110 mg/dL — ABNORMAL HIGH (ref 70–99)
Potassium: 3.4 mEq/L — ABNORMAL LOW (ref 3.5–5.1)
Total Protein: 7.1 g/dL (ref 6.0–8.3)

## 2012-03-12 LAB — URINALYSIS, ROUTINE W REFLEX MICROSCOPIC
Bilirubin Urine: NEGATIVE
Glucose, UA: NEGATIVE mg/dL
Ketones, ur: 15 mg/dL — AB
Leukocytes, UA: NEGATIVE
Protein, ur: NEGATIVE mg/dL

## 2012-03-12 LAB — POCT PREGNANCY, URINE: Preg Test, Ur: NEGATIVE

## 2012-03-12 MED ORDER — SODIUM CHLORIDE 0.9 % IV BOLUS (SEPSIS)
1000.0000 mL | Freq: Once | INTRAVENOUS | Status: AC
  Administered 2012-03-13: 1000 mL via INTRAVENOUS

## 2012-03-12 NOTE — ED Provider Notes (Signed)
History     CSN: [id number]  Arrival date & time 03/12/12  1547   First MD Initiated Contact with Patient 03/12/12 2212      Chief Complaint  Patient presents with  . Abdominal Pain     Patient is a 41 y.o. female presenting with abdominal pain. The history is provided by the patient.  Abdominal Pain The primary symptoms of the illness include abdominal pain (LUQ), nausea and diarrhea. The primary symptoms of the illness do not include fever, shortness of breath, vomiting or dysuria. The current episode started 2 days ago. The onset of the illness was gradual. The problem has been gradually worsening.  The abdominal pain began 2 days ago. The pain came on gradually. The abdominal pain is located in the LUQ. The abdominal pain does not radiate. The abdominal pain is relieved by nothing.  The diarrhea began 2 days ago. The diarrhea is watery. The diarrhea occurs 2 to 4 times per day.  The patient states that she believes she is currently not pregnant. The patient has had a change in bowel habit (diarrhea). Symptoms associated with the illness do not include chills, anorexia, diaphoresis, heartburn, constipation, urgency, frequency or back pain.    Past Medical History  Diagnosis Date  . Pneumonia     with cavitation of left lower lobe  . Asthma   . Migraine   . Hot flashes   . Palpitations   . Chest pressure   . Anxiety   . Fibromyalgia     Past Surgical History  Procedure Date  . Tubal ligation 1993    Family History  Problem Relation Age of Onset  . Migraines Daughter   . Migraines Son   . Hypertension    . Hypertension Mother   . Hypertension Father     History  Substance Use Topics  . Smoking status: Current Everyday Smoker -- 0.0 packs/day    Types: Cigarettes  . Smokeless tobacco: Never Used   Comment: quit last month  . Alcohol Use: No    OB History    Grav Para Term Preterm Abortions TAB SAB Ect Mult Living   2 2 1 1      2       Review of Systems    Constitutional: Negative for fever, chills, diaphoresis, activity change and appetite change.  HENT: Negative for neck pain and neck stiffness.   Respiratory: Negative for cough, chest tightness, shortness of breath and wheezing.   Cardiovascular: Negative for chest pain and palpitations.  Gastrointestinal: Positive for nausea, abdominal pain (LUQ) and diarrhea. Negative for heartburn, vomiting, constipation and anorexia.  Genitourinary: Negative for dysuria, urgency, frequency, decreased urine volume and difficulty urinating.  Musculoskeletal: Negative for back pain.  Skin: Negative for wound.  Neurological: Negative for seizures, syncope, facial asymmetry and light-headedness.  Psychiatric/Behavioral: Negative for confusion and agitation.  All other systems reviewed and are negative.    Allergies  Codeine; Hydrocodone; and Morphine  Home Medications   Current Outpatient Rx  Name Route Sig Dispense Refill  . ALBUTEROL SULFATE HFA 108 (90 BASE) MCG/ACT IN AERS Inhalation Inhale 2 puffs into the lungs every 6 (six) hours as needed. For shortness of breath    . CETIRIZINE HCL 10 MG PO TABS Oral Take 10 mg by mouth daily.    . DEXLANSOPRAZOLE 60 MG PO CPDR Oral Take 60 mg by mouth daily.      Marland Kitchen DICLOFENAC SODIUM 75 MG PO TBEC Oral Take 75 mg by mouth  2 (two) times daily.      Marland Kitchen FLUTICASONE PROPIONATE 50 MCG/ACT NA SUSP Nasal Place 2 sprays into the nose daily.      Marland Kitchen METOPROLOL SUCCINATE ER 25 MG PO TB24 Oral Take 25 mg by mouth daily.      Marland Kitchen MIRTAZAPINE 15 MG PO TABS Oral Take 15 mg by mouth at bedtime.    . OMEPRAZOLE 20 MG PO CPDR Oral Take 20 mg by mouth daily.    Marland Kitchen PREGABALIN 75 MG PO CAPS Oral Take 75 mg by mouth 2 (two) times daily.    Marland Kitchen PROMETHAZINE HCL 25 MG PO TABS Oral Take 25 mg by mouth every 8 (eight) hours as needed. For nausea    . VENLAFAXINE HCL ER 150 MG PO CP24 Oral Take 150 mg by mouth daily.        BP 119/67  Pulse 67  Temp 98 F (36.7 C) (Oral)  Resp 14   SpO2 100%  Physical Exam  Nursing note and vitals reviewed. Constitutional: She appears well-developed and well-nourished.  HENT:  Head: Normocephalic and atraumatic.  Right Ear: External ear normal.  Left Ear: External ear normal.  Nose: Nose normal.  Mouth/Throat: Oropharynx is clear and moist. No oropharyngeal exudate.  Eyes: Conjunctivae are normal. Pupils are equal, round, and reactive to light.  Neck: Normal range of motion. Neck supple.  Cardiovascular: Normal rate, regular rhythm, normal heart sounds and intact distal pulses.  Exam reveals no gallop and no friction rub.   No murmur heard. Pulmonary/Chest: Effort normal and breath sounds normal. No respiratory distress. She has no wheezes. She has no rales. She exhibits no tenderness.  Abdominal: Soft. Bowel sounds are normal. She exhibits no distension and no mass. There is tenderness (LUQ). There is no rebound and no guarding.  Musculoskeletal: Normal range of motion. She exhibits no edema and no tenderness.  Neurological: She is alert.  Skin: Skin is warm and dry.  Psychiatric: She has a normal mood and affect. Her behavior is normal. Judgment and thought content normal.    ED Course  Procedures (including critical care time)  Labs Reviewed  URINALYSIS, ROUTINE W REFLEX MICROSCOPIC - Abnormal; Notable for the following:    Ketones, ur 15 (*)     All other components within normal limits  DIFFERENTIAL - Abnormal; Notable for the following:    Neutrophils Relative 36 (*)     Lymphocytes Relative 56 (*)     Lymphs Abs 4.6 (*)     All other components within normal limits  COMPREHENSIVE METABOLIC PANEL - Abnormal; Notable for the following:    Potassium 3.4 (*)     Glucose, Bld 110 (*)     All other components within normal limits  CBC  LIPASE, BLOOD  POCT PREGNANCY, URINE  12 No results found.   1. Diarrhea   2. Abdominal pain   3. Nausea       MDM  41 yo F w/hx of fibromyalgia presents with LUQ abdominal  pain for 2 days and associated nausea/diarrhea. Denies urinary si/sx. TTP localized to LUQ. Not pleuritic. Clinical picture, including trace ketones in urine, is c/w mild dehydration; will provide IVF bolus. Labs otherwise unremarkable and not concerning for electrolyte abnormalities or acute pancreatitis. While awaiting CT of abdomen/pelvis, care transferred to Dr. Hyacinth Meeker pending CT.        Clemetine Marker, MD 03/13/12 [phone]

## 2012-03-12 NOTE — ED Notes (Signed)
Pt presents to department for evaluation of L sided abdominal pain radiating to center of abdomen. Ongoing x2 days. Also states nausea and diarrhea today. Denies urinary symptoms. 9/10 pain upon arrival to ED. She is alert and oriented x4.

## 2012-03-12 NOTE — ED Notes (Signed)
PT reports epigastric pain and area in upper abdomen that she reports is a bulge.

## 2012-03-13 ENCOUNTER — Encounter (HOSPITAL_COMMUNITY): Payer: Self-pay | Admitting: Radiology

## 2012-03-13 ENCOUNTER — Emergency Department (HOSPITAL_COMMUNITY): Payer: Medicaid Other

## 2012-03-13 MED ORDER — ONDANSETRON HCL 4 MG/2ML IJ SOLN: 4.0000 mg | Freq: Once | INTRAMUSCULAR | Status: DC

## 2012-03-13 MED ORDER — IOHEXOL 300 MG/ML  SOLN: 20.0000 mL | INTRAMUSCULAR | Status: AC

## 2012-03-13 MED ORDER — ONDANSETRON HCL 4 MG/2ML IJ SOLN
INTRAMUSCULAR | Status: AC
  Administered 2012-03-13: 4 mg
  Filled 2012-03-13: qty 2

## 2012-03-13 MED ORDER — HYDROMORPHONE HCL PF 1 MG/ML IJ SOLN
1.0000 mg | Freq: Once | INTRAMUSCULAR | Status: AC
  Administered 2012-03-13: 1 mg via INTRAVENOUS
  Filled 2012-03-13: qty 1

## 2012-03-13 MED ORDER — ONDANSETRON HCL 4 MG PO TABS: 4.0000 mg | ORAL_TABLET | Freq: Three times a day (TID) | ORAL | Status: AC | PRN

## 2012-03-13 MED ORDER — IOHEXOL 300 MG/ML  SOLN
100.0000 mL | Freq: Once | INTRAMUSCULAR | Status: AC | PRN
  Administered 2012-03-13: 100 mL via INTRAVENOUS

## 2012-03-13 NOTE — ED Provider Notes (Signed)
Complains of abdominal pain at epigastrium and left upper quadrant onset 2 days ago. Pain is not made better or worse by use  eating Associated symptoms include nausea and slight amount of diarrhea. Pain presently not on a scale of 1-10 on exam alert nontoxic abdomen tender at left upper quadrant and epigastrium no guarding rigidity or rebound  Doug Sou, MD 03/13/12 0148

## 2012-03-13 NOTE — ED Notes (Signed)
Patient transported to CT 

## 2012-03-13 NOTE — ED Notes (Signed)
Pt ambulated with a steady gait; VSS; A&Ox3; no signs of distress; respirations even and unlabored; skin warm and dry; no questions.  

## 2012-03-13 NOTE — Discharge Instructions (Signed)
Clear Liquid Diet The clear liquid dietconsists of foods that are liquid or will become liquid at room temperature.You should be able to see through the liquid and beverages. Examples of foods allowed on a clear liquid diet include fruit juice, broth or bouillon, gelatin, or frozen ice pops. The purpose of this diet is to provide necessary fluid, electrolytes such as sodium and potassium, and energy to keep the body functioning during times when you are not able to consume a regular diet.A clear liquid diet should not be continued for long periods of time as it is not nutritionally adequate.  REASONS FOR USING A CLEAR LIQUID DIET  In sudden onset (acute) conditions for a patient before or after surgery.   As the first step in oral feeding.   For fluid and electrolyte replacement in diarrheal diseases.   As a diet before certain medical tests are performed.  ADEQUACY The clear liquid diet is adequate only in ascorbic acid, according to the Recommended Dietary Allowances of the [employer]. CHOOSING FOODS Breads and Starches  Allowed:  None are allowed.   Avoid: All are avoided.  Vegetables  Allowed:  Strained tomato or vegetable juice.   Avoid: Any others.  Fruit  Allowed:  Strained fruit juices and fruit drinks. Include 1 serving of citrus or vitamin C-enriched fruit juice daily.   Avoid: Any others.  Meat and Meat Substitutes  Allowed:  None are allowed.   Avoid: All are avoided.  Milk  Allowed:  None are allowed.   Avoid: All are avoided.  Soups and Combination Foods  Allowed:  Clear bouillon, broth, or strained broth-based soups.   Avoid: Any others.  Desserts and Sweets  Allowed:  Sugar, honey. High protein gelatin. Flavored gelatin, ices, or frozen ice pops that do not contain milk.   Avoid: Any others.  Fats and Oils  Allowed:  None are allowed.   Avoid: All are avoided.  Beverages  Allowed: Cereal beverages, coffee (regular or  decaffeinated), tea, or soda at the discretion of your caregiver.   Avoid: Any others.  Condiments  Allowed:  Iodized salt.   Avoid: Any others, including pepper.  Supplements  Allowed:  Liquid nutrition beverages.   Avoid: Any others that contain lactose or fiber.  SAMPLE MEAL PLAN Breakfast  4 oz (120 mL) strained orange juice.    to 1 cup (125 to 250 mL) gelatin (plain or fortified).   1 cup (250 mL) beverage (coffee or tea).   Sugar, if desired.  Midmorning Snack   cup (125 mL) gelatin (plain or fortified).  Lunch  1 cup (250 mL) broth or consomm.   4 oz (120 mL) strained grapefruit juice.    cup (125 mL) gelatin (plain or fortified).   1 cup (250 mL) beverage (coffee or tea).   Sugar, if desired.  Midafternoon Snack   cup (125 mL) fruit ice.    cup (125 mL) strained fruit juice.  Dinner  1 cup (250 mL) broth or consomm.    cup (125 mL) cranberry juice.    cup (125 mL) flavored gelatin (plain or fortified).   1 cup (250 mL) beverage (coffee or tea).   Sugar, if desired.  Evening Snack  4 oz (120 mL) strained apple juice (vitamin C-fortified).    cup (125 mL) flavored gelatin (plain or fortified).  Document Released: 09/11/2005 Document Revised: 08/31/2011 Document Reviewed: 12/09/2010 [facility] Patient Information 2012 [place], [place].Diarrhea Diarrhea is watery poop (stool). The most common cause of diarrhea  is a germ. Other causes include:  Food poisoning.   A reaction to medicine.  HOME CARE   Drink clear fluids. This can stop you from losing too much body fluid (dehydration).   Drink enough fluids to keep your pee (urine) clear or pale yellow.   Avoid solid foods and dairy products until you start to feel better. Then start eating bland foods, such as:   Bananas.   Rice.   Crackers.   Applesauce.   Dry toast.   Avoid spicy foods, caffeine, and alcohol.   Your doctor may give medicine to help with cramps and watery  poop. Take this as told. Avoid these medicines if you have a fever or blood in your poop.   Take your medicine as told. Finish them even if you start to feel better.  GET HELP RIGHT AWAY IF:   The watery poop lasts longer than 3 days.   You have a fever.   Your baby is older than 3 months with a rectal temperature of 100.5 F (38.1 C) or higher for more than 1 day.   There is blood in your poop.   You start to throw up (vomit).   You lose too much fluid.  MAKE SURE YOU:   Understand these instructions.   Will watch your condition.   Will get help right away if you are not doing well or get worse.  Document Released: 02/28/2008 Document Revised: 08/31/2011 Document Reviewed: 02/28/2008 [facility] Patient Information 2012 [place], [place].Diet for Diarrhea, Adult Having frequent, runny stools (diarrhea) has many causes. Diarrhea may be caused or worsened by food or drink. Diarrhea may be relieved by changing your diet. IF YOU ARE NOT TOLERATING SOLID FOODS:  Drink enough water and fluids to keep your urine clear or pale yellow.   Avoid sugary drinks and sodas as well as milk-based beverages.   Avoid beverages containing caffeine and alcohol.   You may try rehydrating beverages. You can make your own by following this recipe:    tsp table salt.    tsp baking soda.   ? tsp salt substitute (potassium chloride).   1 tbs + 1 tsp sugar.   1 qt water.  As your stools become more solid, you can start eating solid foods. Add foods one at a time. If a certain food causes your diarrhea to get worse, avoid that food and try other foods. A low fiber, low-fat, and lactose-free diet is recommended. Small, frequent meals may be better tolerated.  Starches  Allowed:  White, [place], and pita breads, plain rolls, buns, bagels. Plain muffins, matzo. Soda, saltine, or graham crackers. Pretzels, melba toast, zwieback. Cooked cereals made with water: cornmeal, farina, cream cereals. Dry  cereals: refined corn, wheat, rice. Potatoes prepared any way without skins, refined macaroni, spaghetti, noodles, refined rice.   Avoid:  Bread, rolls, or crackers made with whole wheat, multi-grains, rye, bran seeds, nuts, or coconut. Corn tortillas or taco shells. Cereals containing whole grains, multi-grains, bran, coconut, nuts, or raisins. Cooked or dry oatmeal. Coarse wheat cereals, granola. Cereals advertised as "high-fiber." Potato skins. Whole grain pasta, wild or brown rice. Popcorn. Sweet potatoes/yams. Sweet rolls, doughnuts, waffles, pancakes, sweet breads.  Vegetables  Allowed: Strained tomato and vegetable juices. Most well-cooked and canned vegetables without seeds. Fresh: Tender lettuce, cucumber without the skin, cabbage, spinach, bean sprouts.   Avoid: Fresh, cooked, or canned: Artichokes, baked beans, beet greens, broccoli, Brussels sprouts, corn, kale, legumes, peas, sweet potatoes. Cooked: Green or red  cabbage, spinach. Avoid large servings of any vegetables, because vegetables shrink when cooked, and they contain more fiber per serving than fresh vegetables.  Fruit  Allowed: All fruit juices except prune juice. Cooked or canned: Apricots, applesauce, cantaloupe, cherries, fruit cocktail, grapefruit, grapes, kiwi, mandarin oranges, peaches, pears, plums, watermelon. Fresh: Apples without skin, ripe banana, grapes, cantaloupe, cherries, grapefruit, peaches, oranges, plums. Keep servings limited to  cup or 1 piece.   Avoid: Fresh: Apple with skin, apricots, mango, pears, raspberries, strawberries. Prune juice, stewed or dried prunes. Dried fruits, raisins, dates. Large servings of all fresh fruits.  Meat and Meat Substitutes  Allowed: Ground or well-cooked tender beef, ham, veal, lamb, pork, or poultry. Eggs, plain cheese. Fish, oysters, shrimp, lobster, other seafoods. Liver, organ meats.   Avoid: Tough, fibrous meats with gristle. Peanut butter, smooth or chunky. Cheese,  nuts, seeds, legumes, dried peas, beans, lentils.  Milk  Allowed: Yogurt, lactose-free milk, kefir, drinkable yogurt, buttermilk, soy milk.   Avoid: Milk, chocolate milk, beverages made with milk, such as milk shakes.  Soups  Allowed: Bouillon, broth, or soups made from allowed foods. Any strained soup.   Avoid: Soups made from vegetables that are not allowed, cream or milk-based soups.  Desserts and Sweets  Allowed: Sugar-free gelatin, sugar-free frozen ice pops made without sugar alcohol.   Avoid: Plain cakes and cookies, pie made with allowed fruit, pudding, custard, cream pie. Gelatin, fruit, ice, sherbet, frozen ice pops. Ice cream, ice milk without nuts. Plain hard candy, honey, jelly, molasses, syrup, sugar, chocolate syrup, gumdrops, marshmallows.  Fats and Oils  Allowed: Avoid any fats and oils.   Avoid: Seeds, nuts, olives, avocados. Margarine, butter, cream, mayonnaise, salad oils, plain salad dressings made from allowed foods. Plain gravy, crisp bacon without rind.  Beverages  Allowed: Water, decaffeinated teas, oral rehydration solutions, sugar-free beverages.   Avoid: Fruit juices, caffeinated beverages (coffee, tea, soda or pop), alcohol, sports drinks, or lemon-lime soda or pop.  Condiments  Allowed: Ketchup, mustard, horseradish, vinegar, cream sauce, cheese sauce, cocoa powder. Spices in moderation: allspice, basil, bay leaves, celery powder or leaves, cinnamon, cumin powder, curry powder, ginger, mace, marjoram, onion or garlic powder, oregano, paprika, parsley flakes, ground pepper, rosemary, sage, savory, tarragon, thyme, turmeric.   Avoid: Coconut, honey.  Weight Monitoring: Weigh yourself every day. You should weigh yourself in the morning after you urinate and before you eat breakfast. Wear the same amount of clothing when you weigh yourself. Record your weight daily. Bring your recorded weights to your clinic visits. Tell your caregiver right away if you have  gained 3 lb/1.4 kg or more in 1 day, 5 lb/2.3 kg in a week, or whatever amount you were told to report. SEEK IMMEDIATE MEDICAL CARE IF:   You are unable to keep fluids down.   You start to throw up (vomit) or diarrhea keeps coming back (persistent).   Abdominal pain develops, increases, or can be felt in one place (localizes).   You have an oral temperature above 102 F (38.9 C), not controlled by medicine.   Diarrhea contains blood or mucus.   You develop excessive weakness, dizziness, fainting, or extreme thirst.  MAKE SURE YOU:   Understand these instructions.   Will watch your condition.   Will get help right away if you are not doing well or get worse.  Document Released: 12/02/2003 Document Revised: 08/31/2011 Document Reviewed: 03/25/2009 [facility] Patient Information 2012 [place], [place].

## 2012-03-13 NOTE — ED Provider Notes (Signed)
I have personally seen and examined the patient.  I have discussed the plan of care with the resident.  I have reviewed the documentation on PMH/FH/Soc. History.  I have reviewed the documentation of the resident and agree.  Doug Sou, MD 03/13/12 [phone]

## 2012-03-19 ENCOUNTER — Ambulatory Visit (INDEPENDENT_AMBULATORY_CARE_PROVIDER_SITE_OTHER): Payer: Self-pay | Admitting: *Deleted

## 2012-03-25 ENCOUNTER — Ambulatory Visit: Payer: Self-pay

## 2012-03-26 ENCOUNTER — Ambulatory Visit (INDEPENDENT_AMBULATORY_CARE_PROVIDER_SITE_OTHER): Payer: Self-pay

## 2012-03-26 ENCOUNTER — Encounter: Payer: Self-pay | Admitting: *Deleted

## 2012-03-26 VITALS — BP 122/89 | HR 68 | Temp 98.1°F | Ht 60.0 in | Wt 147.3 lb

## 2012-03-26 DIAGNOSIS — N898 Other specified noninflammatory disorders of vagina: Secondary | ICD-10-CM

## 2012-03-26 DIAGNOSIS — N949 Unspecified condition associated with female genital organs and menstrual cycle: Secondary | ICD-10-CM | POA: Insufficient documentation

## 2012-03-26 DIAGNOSIS — D259 Leiomyoma of uterus, unspecified: Secondary | ICD-10-CM

## 2012-03-26 DIAGNOSIS — N939 Abnormal uterine and vaginal bleeding, unspecified: Secondary | ICD-10-CM

## 2012-03-26 DIAGNOSIS — D219 Benign neoplasm of connective and other soft tissue, unspecified: Secondary | ICD-10-CM | POA: Insufficient documentation

## 2012-03-26 MED ORDER — LEUPROLIDE ACETATE (3 MONTH) 11.25 MG IM KIT
11.2500 mg | PACK | Freq: Once | INTRAMUSCULAR | Status: AC
  Administered 2012-03-26: 11.25 mg via INTRAMUSCULAR

## 2012-04-25 ENCOUNTER — Ambulatory Visit: Payer: Self-pay | Admitting: Obstetrics and Gynecology

## 2012-04-25 ENCOUNTER — Ambulatory Visit: Payer: Self-pay | Admitting: Physician Assistant

## 2012-05-20 ENCOUNTER — Emergency Department (INDEPENDENT_AMBULATORY_CARE_PROVIDER_SITE_OTHER)
Admission: EM | Admit: 2012-05-20 | Discharge: 2012-05-20 | Disposition: A | Discharge status: Home / Self Care | Payer: Self-pay | Source: Home / Self Care | Attending: Emergency Medicine | Admitting: Emergency Medicine

## 2012-05-20 ENCOUNTER — Encounter (HOSPITAL_COMMUNITY): Payer: Self-pay

## 2012-05-20 DIAGNOSIS — G43909 Migraine, unspecified, not intractable, without status migrainosus: Secondary | ICD-10-CM

## 2012-05-20 MED ORDER — DIPHENHYDRAMINE HCL 50 MG/ML IJ SOLN
INTRAMUSCULAR | Status: AC
  Filled 2012-05-20: qty 1

## 2012-05-20 MED ORDER — DEXAMETHASONE SODIUM PHOSPHATE 10 MG/ML IJ SOLN
INTRAMUSCULAR | Status: AC
  Filled 2012-05-20: qty 1

## 2012-05-20 MED ORDER — DIPHENHYDRAMINE HCL 50 MG/ML IJ SOLN
25.0000 mg | Freq: Once | INTRAMUSCULAR | Status: AC
  Administered 2012-05-20: 25 mg via INTRAMUSCULAR

## 2012-05-20 MED ORDER — DEXAMETHASONE SODIUM PHOSPHATE 10 MG/ML IJ SOLN
10.0000 mg | Freq: Once | INTRAMUSCULAR | Status: AC
  Administered 2012-05-20: 10 mg via INTRAMUSCULAR

## 2012-05-20 MED ORDER — BUSPIRONE HCL 15 MG PO TABS: 7.5000 mg | ORAL_TABLET | Freq: Two times a day (BID) | ORAL | Status: DC

## 2012-05-20 MED ORDER — KETOROLAC TROMETHAMINE 60 MG/2ML IM SOLN
60.0000 mg | Freq: Once | INTRAMUSCULAR | Status: AC
  Administered 2012-05-20: 60 mg via INTRAMUSCULAR

## 2012-05-20 MED ORDER — PREDNISONE 10 MG PO TABS: ORAL_TABLET | ORAL | Status: DC

## 2012-05-20 MED ORDER — SUMATRIPTAN SUCCINATE 50 MG PO TABS: 50.0000 mg | ORAL_TABLET | ORAL | Status: DC | PRN

## 2012-05-20 MED ORDER — KETOROLAC TROMETHAMINE 60 MG/2ML IM SOLN
INTRAMUSCULAR | Status: AC
  Filled 2012-05-20: qty 2

## 2012-05-20 NOTE — ED Provider Notes (Signed)
Chief Complaint  Patient presents with  . Migraine    History of Present Illness:   Katherine [name] is a 41 year old female who has had migraine headaches for years. She's been on medication for this in the past, but because of finances cannot afford to take anything right now. She had been followed at [facility]. She's had a continuous migraine headache for the past week. This is left-sided and pressure-like at times with throbbing and sharp stabbing. It's associated with nausea but no vomiting, photophobia, and phonophobia. Is worse with any type of exercise and better with rest. Her vision has been somewhat blurry and she's felt off balance. She denies any numbness, tingling, paresthesias, or muscle weakness. She has no problem with speech. She denies any fever, chills, or stiff neck.  She is also requesting something for anxiety today. She denies any panic attacks, depression, or suicidal ideation.  Review of Systems:  Other than noted above, the patient denies any of the following symptoms: Systemic:  No fever, chills, fatigue, photophobia, stiff neck. Eye:  No redness, eye pain, discharge, blurred vision, or diplopia. ENT:  No nasal congestion, rhinorrhea, sinus pressure or pain, sneezing, earache, or sore throat.  No jaw claudication. Neuro:  No paresthesias, loss of consciousness, seizure activity, muscle weakness, trouble with coordination or gait, trouble speaking or swallowing. Psych:  No depression, anxiety or trouble sleeping.  PMFSH:  Past medical history, family history, social history, meds, and allergies were reviewed.  Physical Exam:   Vital signs:  BP 121/82  Pulse 66  Temp 98.2 F (36.8 C) (Oral)  Resp 18  SpO2 100% General:  Alert and oriented.  In no distress. Eye:  Lids and conjunctivas normal.  PERRL,  Full EOMs.  Fundi benign with normal discs and vessels. ENT:  No cranial or facial tenderness to palpation.  TMs and canals clear.  Nasal mucosa was normal  and uncongested without any drainage. No intra oral lesions, pharynx clear, mucous membranes moist, dentition normal. Neck:  Supple, full ROM, no tenderness to palpation.  No adenopathy or mass. Neuro:  Alert and orented times 3.  Speech was clear, fluent, and appropriate.  Cranial nerves intact. No pronator drift, muscle strength normal. Finger to nose normal.  DTRs were 2+ and symmetrical.Station and gait were normal.  Romberg's sign was normal.  Able to perform tandem gait well. Psych:  Normal affect.  Medications given in UCC:  She was given Decadron 10 mg IM, Toradol 60 mg IM, and Benadryl 25 mg IM and tolerated this well without any immediate side effects.  Assessment:  The encounter diagnosis was Migraine headache.  Plan:   1.  The following meds were prescribed:   New Prescriptions   BUSPIRONE (BUSPAR) 15 MG TABLET    Take 0.5 tablets (7.5 mg total) by mouth 2 (two) times daily.   PREDNISONE (DELTASONE) 10 MG TABLET    Take 4 tabs daily for 4 days, 3 tabs daily for 4 days, 2 tabs daily for 4 days, then 1 tab daily for 4 days.   SUMATRIPTAN (IMITREX) 50 MG TABLET    Take 1 tablet (50 mg total) by mouth every 2 (two) hours as needed for migraine.   2.  The patient was instructed in symptomatic care and handouts were given. 3.  The patient was told to return if becoming worse in any way, if no better in 3 or 4 days, and given some red flag symptoms that would indicate earlier return.  Reuben Likes, MD 05/20/12 2055

## 2012-05-20 NOTE — ED Notes (Signed)
States she has had a MHA for 1 week, and since [facility] is closed, she has no care for her HA ; was reportedly referred to a headache specialist, but has run out of her HA medicine; [facility] was having her take Rx (unsure of name) and they had reportedly had upped the dose of all her other medicines ; NAD at present, calm, conversanat

## 2012-06-03 ENCOUNTER — Encounter (HOSPITAL_COMMUNITY): Payer: Self-pay | Admitting: *Deleted

## 2012-06-03 ENCOUNTER — Emergency Department (HOSPITAL_COMMUNITY): Payer: Medicaid Other

## 2012-06-03 DIAGNOSIS — F17200 Nicotine dependence, unspecified, uncomplicated: Secondary | ICD-10-CM | POA: Insufficient documentation

## 2012-06-03 DIAGNOSIS — M549 Dorsalgia, unspecified: Secondary | ICD-10-CM | POA: Insufficient documentation

## 2012-06-03 DIAGNOSIS — IMO0001 Reserved for inherently not codable concepts without codable children: Secondary | ICD-10-CM | POA: Insufficient documentation

## 2012-06-03 LAB — CBC WITH DIFFERENTIAL/PLATELET
Basophils Absolute: 0 10*3/uL (ref 0.0–0.1)
Eosinophils Absolute: 0.2 10*3/uL (ref 0.0–0.7)
Eosinophils Relative: 2 % (ref 0–5)
HCT: 40.5 % (ref 36.0–46.0)
Lymphocytes Relative: 40 % (ref 12–46)
MCH: 27.3 pg (ref 26.0–34.0)
MCV: 82.5 fL (ref 78.0–100.0)
Monocytes Absolute: 0.7 10*3/uL (ref 0.1–1.0)
Platelets: 224 10*3/uL (ref 150–400)
RDW: 14.5 % (ref 11.5–15.5)
WBC: 10.7 10*3/uL — ABNORMAL HIGH (ref 4.0–10.5)

## 2012-06-03 LAB — COMPREHENSIVE METABOLIC PANEL
ALT: 27 U/L (ref 0–35)
AST: 34 U/L (ref 0–37)
Albumin: 4 g/dL (ref 3.5–5.2)
Alkaline Phosphatase: 106 U/L (ref 39–117)
CO2: 29 mEq/L (ref 19–32)
Chloride: 103 mEq/L (ref 96–112)
Creatinine, Ser: 0.52 mg/dL (ref 0.50–1.10)
GFR calc non Af Amer: >90 mL/min (ref >90)
Potassium: 4.4 mEq/L (ref 3.5–5.1)
Sodium: 141 mEq/L (ref 135–145)
Total Bilirubin: 0.2 mg/dL — ABNORMAL LOW (ref 0.3–1.2)

## 2012-06-03 LAB — URINALYSIS, ROUTINE W REFLEX MICROSCOPIC
Bilirubin Urine: NEGATIVE
Leukocytes, UA: NEGATIVE
Nitrite: NEGATIVE
Specific Gravity, Urine: 1.019 (ref 1.005–1.030)
Urobilinogen, UA: 1 mg/dL (ref 0.0–1.0)
pH: 7.5 (ref 5.0–8.0)

## 2012-06-03 NOTE — ED Notes (Signed)
The pt is c/o pain under her rt shoulder blade for 3-4 days she has also had a low grade temp  For 2 days.  Nauseated dizziness also.  lmp 2 months

## 2012-06-04 ENCOUNTER — Emergency Department (HOSPITAL_COMMUNITY)
Admission: EM | Admit: 2012-06-04 | Discharge: 2012-06-04 | Disposition: A | Discharge status: Home / Self Care | Payer: Medicaid Other | Attending: Emergency Medicine | Admitting: Emergency Medicine

## 2012-06-04 DIAGNOSIS — S39012A Strain of muscle, fascia and tendon of lower back, initial encounter: Secondary | ICD-10-CM

## 2012-06-04 MED ORDER — IBUPROFEN 800 MG PO TABS: 800.0000 mg | ORAL_TABLET | Freq: Three times a day (TID) | ORAL | Status: AC

## 2012-06-04 MED ORDER — KETOROLAC TROMETHAMINE 60 MG/2ML IM SOLN
60.0000 mg | Freq: Once | INTRAMUSCULAR | Status: AC
  Administered 2012-06-04: 60 mg via INTRAMUSCULAR
  Filled 2012-06-04: qty 2

## 2012-06-04 MED ORDER — CYCLOBENZAPRINE HCL 10 MG PO TABS: 10.0000 mg | ORAL_TABLET | Freq: Two times a day (BID) | ORAL | Status: AC | PRN

## 2012-06-04 NOTE — ED Notes (Signed)
Warm blanket given

## 2012-06-04 NOTE — ED Provider Notes (Signed)
History     CSN: [id number]  Arrival date & time 06/03/12  2111   First MD Initiated Contact with Patient 06/04/12 0202      Chief Complaint  Patient presents with  . Back Pain    (Consider location/radiation/quality/duration/timing/severity/associated sxs/prior treatment) Patient is a 41 y.o. female presenting with back pain.  Back Pain  Pertinent negatives include no fever, no headaches, no abdominal pain and no dysuria.   Hx per PT, onset about 3-4 days ago, points to a small area R shoulder blade, hurts to move. No cough, no fever. No rash. No recent travel, has not taken anything for this . No CP or SOB. No leg pain or swelling, no BCPs. No new medications. Mod in severity. No known alleviating factors. No trauma, no lifting, no recalled injury Past Medical History  Diagnosis Date  . Pneumonia     with cavitation of left lower lobe  . Asthma   . Migraine   . Hot flashes   . Palpitations   . Chest pressure   . Anxiety   . Fibromyalgia     Past Surgical History  Procedure Date  . Tubal ligation 1993    Family History  Problem Relation Age of Onset  . Migraines Daughter   . Migraines Son   . Hypertension    . Hypertension Mother   . Hypertension Father     History  Substance Use Topics  . Smoking status: Current Everyday Smoker -- 0.0 packs/day    Types: Cigarettes  . Smokeless tobacco: Never Used   Comment: quit last month  . Alcohol Use: No    OB History    Grav Para Term Preterm Abortions TAB SAB Ect Mult Living   2 2 1 1      2       Review of Systems  Constitutional: Negative for fever and chills.  HENT: Negative for neck pain and neck stiffness.   Eyes: Negative for pain.  Respiratory: Negative for cough and shortness of breath.   Cardiovascular: Negative for palpitations and leg swelling.  Gastrointestinal: Negative for abdominal pain.  Genitourinary: Negative for dysuria and flank pain.  Musculoskeletal: Negative for back pain.  Skin:  Negative for rash and wound.  Neurological: Negative for headaches.  All other systems reviewed and are negative.    Allergies  Codeine; Hydrocodone; Morphine; and Penicillins  Home Medications   Current Outpatient Rx  Name Route Sig Dispense Refill  . ALBUTEROL SULFATE HFA 108 (90 BASE) MCG/ACT IN AERS Inhalation Inhale 2 puffs into the lungs every 6 (six) hours as needed. For shortness of breath    . BUSPIRONE HCL 15 MG PO TABS Oral Take 0.5 tablets (7.5 mg total) by mouth 2 (two) times daily. 30 tablet 0  . CETIRIZINE HCL 10 MG PO TABS Oral Take 10 mg by mouth daily.    . DEXLANSOPRAZOLE 60 MG PO CPDR Oral Take 60 mg by mouth daily.      Marland Kitchen DICLOFENAC SODIUM 75 MG PO TBEC Oral Take 75 mg by mouth 2 (two) times daily.      Marland Kitchen FLUTICASONE PROPIONATE 50 MCG/ACT NA SUSP Nasal Place 2 sprays into the nose daily.      Marland Kitchen METOPROLOL SUCCINATE ER 25 MG PO TB24 Oral Take 25 mg by mouth daily.      Marland Kitchen MIRTAZAPINE 15 MG PO TABS Oral Take 15 mg by mouth at bedtime.    . OMEPRAZOLE 20 MG PO CPDR Oral Take 20 mg  by mouth daily.    Marland Kitchen PREGABALIN 75 MG PO CAPS Oral Take 75 mg by mouth 2 (two) times daily.    Marland Kitchen PROMETHAZINE HCL 25 MG PO TABS Oral Take 25 mg by mouth every 8 (eight) hours as needed. For nausea    . SUMATRIPTAN SUCCINATE 50 MG PO TABS Oral Take 1 tablet (50 mg total) by mouth every 2 (two) hours as needed for migraine. 10 tablet 0  . VENLAFAXINE HCL ER 150 MG PO CP24 Oral Take 150 mg by mouth daily.        BP 126/70  Pulse 74  Temp 98.2 F (36.8 C) (Oral)  Resp 20  SpO2 100%  LMP 04/02/2012  Physical Exam  Constitutional: She is oriented to person, place, and time. She appears well-developed and well-nourished.  HENT:  Head: Normocephalic and atraumatic.  Eyes: Conjunctivae and EOM are normal. Pupils are equal, round, and reactive to light.  Neck: Trachea normal. Neck supple. No thyromegaly present.  Cardiovascular: Normal rate, regular rhythm, S1 normal, S2 normal and normal  pulses.     No systolic murmur is present   No diastolic murmur is present  Pulses:      Radial pulses are 2+ on the right side, and 2+ on the left side.  Pulmonary/Chest: Effort normal and breath sounds normal. She has no wheezes. She has no rhonchi. She has no rales.  Abdominal: Soft. Normal appearance and bowel sounds are normal. There is no tenderness. There is no CVA tenderness and negative Murphy's sign.  Musculoskeletal:       R shoulder blade point tenderness, reproduces symptoms. No rash or crepitus, no N/V deficits.  BLE:s Calves nontender, no cords or erythema, negative Homans sign  Neurological: She is alert and oriented to person, place, and time. She has normal strength. No cranial nerve deficit or sensory deficit. GCS eye subscore is 4. GCS verbal subscore is 5. GCS motor subscore is 6.  Skin: Skin is warm and dry. No rash noted. She is not diaphoretic.  Psychiatric: Her speech is normal.       Cooperative and appropriate    ED Course  Procedures (including critical care time)  Results for orders placed during the hospital encounter of 06/04/12  URINALYSIS, ROUTINE W REFLEX MICROSCOPIC      Component Value Range   Color, Urine YELLOW  YELLOW   APPearance CLEAR  CLEAR   Specific Gravity, Urine 1.019  1.005 - 1.030   pH 7.5  5.0 - 8.0   Glucose, UA NEGATIVE  NEGATIVE mg/dL   Hgb urine dipstick NEGATIVE  NEGATIVE   Bilirubin Urine NEGATIVE  NEGATIVE   Ketones, ur NEGATIVE  NEGATIVE mg/dL   Protein, ur NEGATIVE  NEGATIVE mg/dL   Urobilinogen, UA 1.0  0.0 - 1.0 mg/dL   Nitrite NEGATIVE  NEGATIVE   Leukocytes, UA NEGATIVE  NEGATIVE  PREGNANCY, URINE      Component Value Range   Preg Test, Ur NEGATIVE  NEGATIVE  COMPREHENSIVE METABOLIC PANEL      Component Value Range   Sodium 141  135 - 145 mEq/L   Potassium 4.4  3.5 - 5.1 mEq/L   Chloride 103  96 - 112 mEq/L   CO2 29  19 - 32 mEq/L   Glucose, Bld 102 (*) 70 - 99 mg/dL   BUN 8  6 - 23 mg/dL   Creatinine, Ser  4.54  0.50 - 1.10 mg/dL   Calcium 9.8  8.4 - 09.8 mg/dL  Total Protein 7.7  6.0 - 8.3 g/dL   Albumin 4.0  3.5 - 5.2 g/dL   AST 34  0 - 37 U/L   ALT 27  0 - 35 U/L   Alkaline Phosphatase 106  39 - 117 U/L   Total Bilirubin 0.2 (*) 0.3 - 1.2 mg/dL   GFR calc non Af Amer >90  >90 mL/min   GFR calc Af Amer >90  >90 mL/min  TROPONIN I      Component Value Range   Troponin I <0.30  <0.30 ng/mL  CBC WITH DIFFERENTIAL      Component Value Range   WBC 10.7 (*) 4.0 - 10.5 K/uL   RBC 4.91  3.87 - 5.11 MIL/uL   Hemoglobin 13.4  12.0 - 15.0 g/dL   HCT 16.1  09.6 - 04.5 %   MCV 82.5  78.0 - 100.0 fL   MCH 27.3  26.0 - 34.0 pg   MCHC 33.1  30.0 - 36.0 g/dL   RDW 40.9  81.1 - 91.4 %   Platelets 224  150 - 400 K/uL   Neutrophils Relative 52  43 - 77 %   Neutro Abs 5.5  1.7 - 7.7 K/uL   Lymphocytes Relative 40  12 - 46 %   Lymphs Abs 4.2 (*) 0.7 - 4.0 K/uL   Monocytes Relative 6  3 - 12 %   Monocytes Absolute 0.7  0.1 - 1.0 K/uL   Eosinophils Relative 2  0 - 5 %   Eosinophils Absolute 0.2  0.0 - 0.7 K/uL   Basophils Relative 0  0 - 1 %   Basophils Absolute 0.0  0.0 - 0.1 K/uL   Dg Chest 2 View  06/03/2012  *RADIOLOGY REPORT*  Clinical Data: Cough.  Right back pain.  Asthma.  CHEST - 2 VIEW  Comparison: 01/14/2011.  Findings: Normal sized heart.  Clear lungs.  Normal appearing bones.  IMPRESSION: Normal examination.   Original Report Authenticated By: Darrol Angel, M.D.    R shoulder blade pain and tenderness c/s MSK etiology. PERC negative.   IM toradol.  Holding narcotics - PT is driving.   Precautions verbalized as understood.  MDM    VS, nursing notes and records reviewed. Improved with medications, stable for d/ home. CXR and labs reviewed.         Sunnie Nielsen, MD 06/04/12 [phone]

## 2012-06-10 ENCOUNTER — Encounter: Payer: Self-pay | Admitting: Obstetrics & Gynecology

## 2012-06-10 ENCOUNTER — Ambulatory Visit (INDEPENDENT_AMBULATORY_CARE_PROVIDER_SITE_OTHER): Payer: Self-pay | Admitting: Obstetrics & Gynecology

## 2012-06-10 VITALS — BP 121/72 | HR 77 | Temp 97.1°F | Ht 64.0 in | Wt 153.4 lb

## 2012-06-10 DIAGNOSIS — R102 Pelvic and perineal pain: Secondary | ICD-10-CM

## 2012-06-10 DIAGNOSIS — N949 Unspecified condition associated with female genital organs and menstrual cycle: Secondary | ICD-10-CM

## 2012-06-10 NOTE — Progress Notes (Signed)
Subjective:     Patient ID: Katherine [name], female   DOB: [birth year], 41 y.o.   MRN: [id number]  HPI Pt with complaints of pelvic pain.  Started on Depo Lupron in April 2013.  Pt s/p 2 shots.  Reports that she has had no pain since the onset of the shots.  She c/o side effects from the shots.  Pt wants to know if she has to continue to take the shots.     Review of Systems      Objective:   Physical ExamBP 121/72  Pulse 77  Temp 97.1 F (36.2 C) (Oral)  Ht 5\' 4"  (1.626 m)  Wt 153 lb 6.4 oz (69.582 kg)  BMI 26.33 kg/m2  LMP 04/02/2012       Assessment:     Pelvic pain- resolved.  D/w pt stopping vs continuing the Lupron.  Pt wants to stop.     Plan:     F/u on pelvic pain in 3 months or sooner prn  Jany Buckwalter L. Harraway-Smith, M.D., [place]

## 2012-06-10 NOTE — Patient Instructions (Signed)

## 2012-06-11 ENCOUNTER — Ambulatory Visit: Payer: Self-pay

## 2012-06-11 ENCOUNTER — Telehealth: Payer: Self-pay | Admitting: Medical

## 2012-06-11 NOTE — Telephone Encounter (Signed)
Reviewed notes in patient chart. Patient seen 06/10/12 by Harraway-Smith. Note states that discussion of whether to continue or stop Lupron Depot was had and patient has chosen to stop at this time. Pelvic pain has resolved. No further action required for medication.

## 2012-06-11 NOTE — Telephone Encounter (Signed)
Trula Ore called stating that this was a reminder for a mutual patient Katherine [name]) that if she is going to continue on Lupron Depot that it is time for her refill. We can call back to order at [phone] option 8.

## 2012-06-18 ENCOUNTER — Encounter (HOSPITAL_COMMUNITY): Payer: Self-pay | Admitting: Emergency Medicine

## 2012-06-18 DIAGNOSIS — Z79899 Other long term (current) drug therapy: Secondary | ICD-10-CM | POA: Insufficient documentation

## 2012-06-18 DIAGNOSIS — R55 Syncope and collapse: Secondary | ICD-10-CM | POA: Insufficient documentation

## 2012-06-18 DIAGNOSIS — J45909 Unspecified asthma, uncomplicated: Secondary | ICD-10-CM | POA: Insufficient documentation

## 2012-06-18 DIAGNOSIS — R000 Tachycardia, unspecified: Secondary | ICD-10-CM | POA: Insufficient documentation

## 2012-06-18 DIAGNOSIS — Y92009 Unspecified place in unspecified non-institutional (private) residence as the place of occurrence of the external cause: Secondary | ICD-10-CM | POA: Insufficient documentation

## 2012-06-18 DIAGNOSIS — W19XXXA Unspecified fall, initial encounter: Secondary | ICD-10-CM | POA: Insufficient documentation

## 2012-06-18 NOTE — ED Notes (Signed)
PT. FELT DIZZY / LIGHTHEADED AND FELL ON HER RIGHT SIDE AT HOME THIS EVENING WITH BRIEF LOC ,  UNABLE TO RECALL INCIDENT , PRESENTS WITH RIGHT FACIAL PAIN / HEADACHE.  ALERT AND ORIENTED , SPEECH CLEAR , NO FACIAL ASYMMETRY , EQUAL GRIPS / AMBULATORY.

## 2012-06-19 ENCOUNTER — Encounter: Payer: Self-pay | Admitting: Cardiology

## 2012-06-19 ENCOUNTER — Ambulatory Visit (INDEPENDENT_AMBULATORY_CARE_PROVIDER_SITE_OTHER): Payer: Self-pay | Admitting: Cardiology

## 2012-06-19 ENCOUNTER — Encounter (HOSPITAL_COMMUNITY): Payer: Self-pay | Admitting: Radiology

## 2012-06-19 ENCOUNTER — Emergency Department (HOSPITAL_COMMUNITY)
Admission: EM | Admit: 2012-06-19 | Discharge: 2012-06-19 | Disposition: A | Discharge status: Home / Self Care | Payer: Medicaid Other | Attending: Emergency Medicine | Admitting: Emergency Medicine

## 2012-06-19 ENCOUNTER — Emergency Department (HOSPITAL_COMMUNITY): Payer: Medicaid Other

## 2012-06-19 VITALS — BP 102/71 | HR 76 | Resp 18 | Ht 64.0 in | Wt 149.0 lb

## 2012-06-19 DIAGNOSIS — W19XXXA Unspecified fall, initial encounter: Secondary | ICD-10-CM

## 2012-06-19 DIAGNOSIS — R55 Syncope and collapse: Secondary | ICD-10-CM

## 2012-06-19 DIAGNOSIS — R000 Tachycardia, unspecified: Secondary | ICD-10-CM

## 2012-06-19 LAB — CBC WITH DIFFERENTIAL/PLATELET
Basophils Absolute: 0 10*3/uL (ref 0.0–0.1)
Basophils Relative: 0 % (ref 0–1)
Eosinophils Relative: 2 % (ref 0–5)
HCT: 41.2 % (ref 36.0–46.0)
MCHC: 32.8 g/dL (ref 30.0–36.0)
MCV: 81.7 fL (ref 78.0–100.0)
Monocytes Absolute: 0.8 10*3/uL (ref 0.1–1.0)
Neutro Abs: 4.4 10*3/uL (ref 1.7–7.7)
Platelets: 254 10*3/uL (ref 150–400)
RDW: 15 % (ref 11.5–15.5)
WBC: 10.1 10*3/uL (ref 4.0–10.5)

## 2012-06-19 LAB — COMPREHENSIVE METABOLIC PANEL
ALT: 43 U/L — ABNORMAL HIGH (ref 0–35)
AST: 31 U/L (ref 0–37)
Albumin: 3.9 g/dL (ref 3.5–5.2)
Calcium: 9.9 mg/dL (ref 8.4–10.5)
Creatinine, Ser: 0.9 mg/dL (ref 0.50–1.10)
Sodium: 139 mEq/L (ref 135–145)

## 2012-06-19 LAB — URINALYSIS, ROUTINE W REFLEX MICROSCOPIC
Bilirubin Urine: NEGATIVE
Glucose, UA: NEGATIVE mg/dL
Hgb urine dipstick: NEGATIVE
Specific Gravity, Urine: 1.012 (ref 1.005–1.030)
pH: 6 (ref 5.0–8.0)

## 2012-06-19 LAB — POCT PREGNANCY, URINE: Preg Test, Ur: NEGATIVE

## 2012-06-19 MED ORDER — METOPROLOL SUCCINATE ER 25 MG PO TB24: 25.0000 mg | ORAL_TABLET | Freq: Two times a day (BID) | ORAL | Status: DC

## 2012-06-19 MED ORDER — POTASSIUM CHLORIDE 10 MEQ/100ML IV SOLN
10.0000 meq | Freq: Once | INTRAVENOUS | Status: AC
  Administered 2012-06-19: 10 meq via INTRAVENOUS
  Filled 2012-06-19: qty 100

## 2012-06-19 MED ORDER — POTASSIUM CHLORIDE CRYS ER 20 MEQ PO TBCR
20.0000 meq | EXTENDED_RELEASE_TABLET | Freq: Once | ORAL | Status: AC
  Administered 2012-06-19: 20 meq via ORAL
  Filled 2012-06-19: qty 1

## 2012-06-19 MED ORDER — POTASSIUM CHLORIDE CRYS ER 20 MEQ PO TBCR: 20.0000 meq | EXTENDED_RELEASE_TABLET | Freq: Every day | ORAL | Status: DC

## 2012-06-19 NOTE — ED Provider Notes (Signed)
41 year old female who presents after a syncopal episode at home. She states that after she got out of the shower she became acutely lightheaded, headache and palpitations with chest pain. She states this lasted somewhere between 10 and 20 minutes culminating in a syncopal episode. She does not remember this time, and when she came to her family member was beside her, she had some chest pain and mild shortness of breath. She states at this time her symptoms have gradually improved and essentially resolved although a small amount chest pain and small amount of headache. She denies any fevers, chills, nausea, vomiting, cough, abdominal pain, back pain, swelling in the legs though she does have mild right lower extremity discomfort. She denies any trauma, travel, immobilization, recent surgery, hormone use other than a quarter to a shot which she gets her doctor for ovarian cysts and of which she is unsure of the name. There has been no family history of venous thromboembolism and no history of cancer.  Physical exam: The patient is well-appearing with a soft abdomen, clear heart and lungs without any murmurs rubs or gallops wheezing rales or rhonchi. Her skin is without rash, lower sugars are without edema or asymmetry or tenderness. On visual inspection she has no scleral icterus, clear oropharynx with moist mucous membranes and has appropriate affect neurologic exam in that she can speak without difficulty, has clear vision subjectively and is able to use all of her extremities without any difficulty. She ambulates in the room without any difficulty and has no orthostatic change in her vital signs.  Assessment: The patient appears well at this time with an EKG with a slight right axis deviation compared to prior EKG. Given her chest pain, palpitations, syncopal event with a slight right axis deviation with consider pulmonary embolus and this is worse. D-dimer has been added to her labs to further evaluate as she  is at low-risk for pulmonary embolism.  D/w Dr. Mayford Knife - requests CDU to get K, have cardiology seen in AM.  Review of record shows that she has had cardiology consultation by Dr. Tenny Craw in the past for parox SVT - on Toprol XL and taking it per pt.  Has not seen Dr. Tenny Craw in > 1 year per notes.  Medical screening examination/treatment/procedure(s) were conducted as a shared visit with non-physician practitioner(s) and myself.  I personally evaluated the patient during the encounter    Vida Roller, MD 06/19/12 [phone]

## 2012-06-19 NOTE — ED Notes (Signed)
Changed rate of IV potassium from 141mL/hr to 70mL/hr due to burning sensation in pt's arm.

## 2012-06-19 NOTE — ED Provider Notes (Signed)
Medical screening examination/treatment/procedure(s) were conducted as a shared visit with non-physician practitioner(s) and myself.  I personally evaluated the patient during the encounter  Please see my separate respective documentation pertaining to this patient encounter   Vida Roller, MD 06/19/12 1546

## 2012-06-19 NOTE — ED Notes (Signed)
TROPONIN RESULTS  cTnl  0.01 ng/mL

## 2012-06-19 NOTE — ED Provider Notes (Signed)
History     CSN: [id number]  Arrival date & time 06/18/12  2317   First MD Initiated Contact with Patient 06/19/12 0059      Chief Complaint  Patient presents with  . Fall    (Consider location/radiation/quality/duration/timing/severity/associated sxs/prior treatment) HPI Comments: This is a 41 year old female, past medical history migraine headaches and heart palpitations, who presents to the ED after experiencing a syncopal episode earlier this evening. Patient reports that she had just gotten out of the shower, and became dizzy and lightheaded. She then fell to the ground and was unconscious for 1-2 minutes as reported by her husband. Patient states that she has never passed out before. She also complains of right-sided  forehead pain secondary to fall.   The history is provided by the patient. No language interpreter was used.    Past Medical History  Diagnosis Date  . Pneumonia     with cavitation of left lower lobe  . Asthma   . Migraine   . Hot flashes   . Palpitations   . Chest pressure   . Anxiety   . Fibromyalgia     Past Surgical History  Procedure Date  . Tubal ligation 1993    Family History  Problem Relation Age of Onset  . Migraines Daughter   . Migraines Son   . Hypertension    . Hypertension Mother   . Hypertension Father     History  Substance Use Topics  . Smoking status: Current Every Day Smoker -- 0.0 packs/day    Types: Cigarettes  . Smokeless tobacco: Never Used   Comment: quit last month  . Alcohol Use: No    OB History    Grav Para Term Preterm Abortions TAB SAB Ect Mult Living   2 2 1 1      2       Review of Systems  HENT:       Right-sided forehead pain  Respiratory: Negative for chest tightness.   Cardiovascular: Positive for palpitations. Negative for chest pain.  Neurological: Negative for weakness and numbness.  All other systems reviewed and are negative.    Allergies  Codeine; Hydrocodone; Morphine; and  Penicillins  Home Medications   Current Outpatient Rx  Name Route Sig Dispense Refill  . ALBUTEROL SULFATE HFA 108 (90 BASE) MCG/ACT IN AERS Inhalation Inhale 2 puffs into the lungs every 6 (six) hours as needed. For shortness of breath    . BUSPIRONE HCL 15 MG PO TABS Oral Take 0.5 tablets (7.5 mg total) by mouth 2 (two) times daily. 30 tablet 0  . CETIRIZINE HCL 10 MG PO TABS Oral Take 10 mg by mouth daily.    . DEXLANSOPRAZOLE 60 MG PO CPDR Oral Take 60 mg by mouth daily.      Marland Kitchen DICLOFENAC SODIUM 75 MG PO TBEC Oral Take 75 mg by mouth 2 (two) times daily.      Marland Kitchen FLUTICASONE PROPIONATE 50 MCG/ACT NA SUSP Nasal Place 2 sprays into the nose daily.      Marland Kitchen METOPROLOL SUCCINATE ER 25 MG PO TB24 Oral Take 25 mg by mouth daily.      Marland Kitchen MIRTAZAPINE 15 MG PO TABS Oral Take 15 mg by mouth at bedtime.    . OMEPRAZOLE 20 MG PO CPDR Oral Take 20 mg by mouth daily.    Marland Kitchen PREGABALIN 75 MG PO CAPS Oral Take 75 mg by mouth 2 (two) times daily.    Marland Kitchen PROMETHAZINE HCL 25 MG PO TABS  Oral Take 25 mg by mouth every 8 (eight) hours as needed. For nausea    . SUMATRIPTAN SUCCINATE 50 MG PO TABS Oral Take 1 tablet (50 mg total) by mouth every 2 (two) hours as needed for migraine. 10 tablet 0  . VENLAFAXINE HCL ER 150 MG PO CP24 Oral Take 150 mg by mouth daily.        BP 117/82  Pulse 108  Temp 98.7 F (37.1 C) (Oral)  Resp 14  SpO2 97%  LMP 04/02/2012  Physical Exam  Constitutional: She is oriented to person, place, and time. She appears well-developed and well-nourished.  HENT:  Head: Normocephalic.       Pain on palpation over right frontal bone.  Eyes: Conjunctivae normal and EOM are normal. Pupils are equal, round, and reactive to light.  Neck: Normal range of motion. Neck supple.  Cardiovascular: Normal rate, regular rhythm and normal heart sounds.   Pulmonary/Chest: Effort normal and breath sounds normal.  Abdominal: Soft. Bowel sounds are normal.  Musculoskeletal: Normal range of motion.    Neurological: She is alert and oriented to person, place, and time.  Skin: Skin is warm and dry.  Psychiatric: She has a normal mood and affect. Her behavior is normal. Judgment and thought content normal.    ED Course  Procedures (including critical care time)  Labs Reviewed  CBC WITH DIFFERENTIAL - Abnormal; Notable for the following:    Lymphs Abs 4.7 (*)     All other components within normal limits  COMPREHENSIVE METABOLIC PANEL - Abnormal; Notable for the following:    Potassium 3.1 (*)     Glucose, Bld 138 (*)     ALT 43 (*)     Total Bilirubin 0.2 (*)     GFR calc non Af Amer 78 (*)     All other components within normal limits  URINALYSIS, ROUTINE W REFLEX MICROSCOPIC   ED ECG REPORT  I personally interpreted this EKG   Date: 06/19/2012   Rate: 76  Rhythm: normal sinus rhythm  QRS Axis: right  Intervals: normal  ST/T Wave abnormalities: normal  Conduction Disutrbances:none  Narrative Interpretation:   Old EKG Reviewed: New right axis deviation       No diagnosis found.    MDM  This is a 41 year old female complaining of a syncopal episode earlier this evening.  EKG, Head CT, and lab findings are detailed above. Waiting for d-dimer negative.  Transferring to CDU for OBS and evaluation by cardiology in the morning.        Roxy Horseman, PA-C 06/19/12 [phone]

## 2012-06-19 NOTE — Patient Instructions (Addendum)
Increase Torpol XL (metoprolol succinate) to 25mg  two times a day.  Start KCL(potassium) 20 mEq daily.  Your physician has recommended that you wear an event monitor. Event monitors are medical devices that record the heart's electrical activity. Doctors most often [place] these monitors to diagnose arrhythmias. Arrhythmias are problems with the speed or rhythm of the heartbeat. The monitor is a small, portable device. You can wear one while you do your normal daily activities. This is usually used to diagnose what is causing palpitations/syncope (passing out). 3 week event monitor  Your physician has requested that you have an echocardiogram. Echocardiography is a painless test that uses sound waves to create images of your heart. It provides your doctor with information about the size and shape of your heart and how well your heart's chambers and valves are working. This procedure takes approximately one hour. There are no restrictions for this procedure.  Your physician recommends that you return for lab work in:10 days--BMET/TSH  Your physician recommends that you schedule a follow-up appointment in: 4 weeks with Dr Tenny Craw.

## 2012-06-19 NOTE — ED Provider Notes (Signed)
7:30 AM Filed Vitals:   06/19/12 0730 06/19/12 0800 06/19/12 0830 06/19/12 0900  BP: 100/65 101/68 116/79 103/75  Pulse: 74 73 70 69  Temp:      TempSrc:      Resp: 22 13 15 12   SpO2: 98% 100% 96% 98%   Assumed care of patient in the CDU. Patient was seen yesterday for run of tachycardia followed by syncopal episode lasted approximately 1-2 minutes. Patient has history of paroxysmal SVT and has is an established patient with [facility], and Dr. Dietrich Pates. I spoken with the patient today who has no complaints at all. Her EKGs have been negative. Patient had some slight hypokalemia. That was replaced with PO, potassium. Negative d-dimer negative head CT for, to frontal bone. After fall. CV: RRR, No M/R/G, Peripheral pulses intact. No peripheral edema. Lungs: CTAB Abd: Soft, Non tender, non distended   I spoken with the PA provider on call today with [facility], to is making followup appointment with the patient with a next 24-48 hours. We are not going to change her medications at this time. She is taking Toprol-XL currently. Blood pressures have been low in we do not want to bottom her out. Discussed reasons to seek immediate care. Patient expresses understanding and agrees with plan.    Arthor Captain, PA-C 06/19/12 [phone]

## 2012-06-19 NOTE — ED Notes (Addendum)
Pt states she became lightheaded about 10pm last night/ pt states when she got out of the shower she stated she couldn't speak and when she walked to the room she passed out and fell on the floor/ pt states " I dont remember nothing after that" pt states she is sore on her right side

## 2012-06-19 NOTE — ED Notes (Signed)
Pt reports LOC at home after showering.  Pt does not recall LOC.  Husband states it lasted about 30 seconds.  Pt reports hitting head on the ground and following at an angle on her right side.

## 2012-06-19 NOTE — ED Provider Notes (Signed)
Medical screening examination/treatment/procedure(s) were performed by non-physician practitioner and as supervising physician I was immediately available for consultation/collaboration.   Lyanne Co, MD 06/19/12 [phone]

## 2012-06-20 ENCOUNTER — Telehealth: Payer: Self-pay | Admitting: *Deleted

## 2012-06-20 DIAGNOSIS — R55 Syncope and collapse: Secondary | ICD-10-CM | POA: Insufficient documentation

## 2012-06-20 LAB — URINE CULTURE: Colony Count: 25000

## 2012-06-20 NOTE — Telephone Encounter (Signed)
Left message for patient to call to schedule some appointment.

## 2012-06-20 NOTE — Progress Notes (Signed)
Patient ID: Katherine [name], female   DOB: 1971/04/29, 41 y.o.   MRN: [id number] 41 yo with history of palpitations and atrial tachycardia noted by event monitor presents for evaluation of syncope.  On 9/24, patient got out of the shower, felt lightheaded, and passed out/fell to the ground.  She felt her heart racing before she passed out.  She was unconscious for < 1 minute.  She went to the ER: telemetry in the ER was unremarkable and labs were normal except for low potassium.  She has not had any prior syncopal episodes.  She has had periodic episodes of tachypalpitations in the past.  She has not been lightheaded with prior episodes of palpitations.  She denies chest pain.  She does report dyspnea after walking 50-100 yards.    ECG: NSR, Qs in V1 and V2  Labs (9/13): hgb 13.5, K 3.1, creatinine 0.9  PMH: 1. Palpitations: Short bursts of atrial tachycardia noted on prior monitoring (8/12 event monitor).   2. Fibromyalgia 3. Migraines  SH: occasional smoking.   FH: no premature CAD.    ROS: All systems reviewed and negative except as per HPI.   Current Outpatient Prescriptions  Medication Sig Dispense Refill  . albuterol (VENTOLIN HFA) 108 (90 BASE) MCG/ACT inhaler Inhale 2 puffs into the lungs every 6 (six) hours as needed. For shortness of breath      . busPIRone (BUSPAR) 15 MG tablet Take 0.5 tablets (7.5 mg total) by mouth 2 (two) times daily.  30 tablet  0  . cetirizine (ZYRTEC) 10 MG tablet Take 10 mg by mouth daily.      Marland Kitchen dexlansoprazole (DEXILANT) 60 MG capsule Take 60 mg by mouth daily.        . diclofenac (VOLTAREN) 75 MG EC tablet Take 75 mg by mouth 2 (two) times daily.        . fluticasone (FLONASE) 50 MCG/ACT nasal spray Place 2 sprays into the nose daily.        . mirtazapine (REMERON) 15 MG tablet Take 15 mg by mouth at bedtime.      Marland Kitchen omeprazole (PRILOSEC) 20 MG capsule Take 20 mg by mouth daily.      . pregabalin (LYRICA) 75 MG capsule Take 75 mg by mouth 2 (two) times  daily.      . promethazine (PHENERGAN) 25 MG tablet Take 25 mg by mouth every 8 (eight) hours as needed. For nausea      . SUMAtriptan (IMITREX) 50 MG tablet Take 1 tablet (50 mg total) by mouth every 2 (two) hours as needed for migraine.  10 tablet  0  . venlafaxine (EFFEXOR-XR) 150 MG 24 hr capsule Take 150 mg by mouth daily.        . metoprolol succinate (TOPROL XL) 25 MG 24 hr tablet Take 1 tablet (25 mg total) by mouth 2 (two) times daily.  60 tablet  6  . potassium chloride SA (K-DUR,KLOR-CON) 20 MEQ tablet Take 1 tablet (20 mEq total) by mouth daily.  30 tablet  1  . DISCONTD: diltiazem (CARDIZEM SR) 120 MG 12 hr capsule Take 1 capsule (120 mg total) by mouth daily.  30 capsule  6    BP 102/71  Pulse 76  Resp 18  Ht 5\' 4"  (1.626 m)  Wt 149 lb (67.586 kg)  BMI 25.58 kg/m2  SpO2 95%  LMP 04/02/2012 General: NAD Neck: No JVD, no thyromegaly or thyroid nodule.  Lungs: Clear to auscultation bilaterally with normal respiratory effort.  CV: Nondisplaced PMI.  Heart regular S1/S2, no S3/S4, no murmur.  No peripheral edema.  No carotid bruit.  Normal pedal pulses.  Abdomen: Soft, nontender, no hepatosplenomegaly, no distention.  Neurologic: Alert and oriented x 3.  Psych: Normal affect. Extremities: No clubbing or cyanosis.   Assessment/Plan:  41 yo with history of palpitations and atrial tachycardia on prior monitoring had a syncopal episode earlier this week.  She was in the shower, got out, felt lightheaded and felt palpitations.  She then passed out briefly.  She has not had a recurrence of this.  This is certainly concerning for an arrhythmia-generated syncopal episode.  It is possible that she had atrial tachycardia and had a vagal response to it with syncope.   - Increase Toprol XL to 25 mg bid.  - 30 day event monitor - Echo to make sure heart is structurally normal.  - I will also have her start KCl 20 mEq daily given hypokalemia on last BMET.   Yeva Bissette [employer]

## 2012-06-26 ENCOUNTER — Encounter: Payer: Self-pay | Admitting: Obstetrics & Gynecology

## 2012-07-01 ENCOUNTER — Other Ambulatory Visit: Payer: Self-pay

## 2012-07-01 ENCOUNTER — Other Ambulatory Visit (HOSPITAL_COMMUNITY): Payer: Self-pay

## 2012-07-02 ENCOUNTER — Ambulatory Visit (HOSPITAL_COMMUNITY): Payer: Medicaid Other | Attending: Cardiology | Admitting: Radiology

## 2012-07-02 ENCOUNTER — Other Ambulatory Visit (INDEPENDENT_AMBULATORY_CARE_PROVIDER_SITE_OTHER): Payer: Self-pay

## 2012-07-02 DIAGNOSIS — R55 Syncope and collapse: Secondary | ICD-10-CM

## 2012-07-02 DIAGNOSIS — I059 Rheumatic mitral valve disease, unspecified: Secondary | ICD-10-CM | POA: Insufficient documentation

## 2012-07-02 DIAGNOSIS — I4891 Unspecified atrial fibrillation: Secondary | ICD-10-CM | POA: Insufficient documentation

## 2012-07-02 DIAGNOSIS — I379 Nonrheumatic pulmonary valve disorder, unspecified: Secondary | ICD-10-CM | POA: Insufficient documentation

## 2012-07-02 DIAGNOSIS — I369 Nonrheumatic tricuspid valve disorder, unspecified: Secondary | ICD-10-CM | POA: Insufficient documentation

## 2012-07-02 DIAGNOSIS — F17200 Nicotine dependence, unspecified, uncomplicated: Secondary | ICD-10-CM | POA: Insufficient documentation

## 2012-07-02 LAB — BASIC METABOLIC PANEL
CO2: 27 mEq/L (ref 19–32)
Chloride: 105 mEq/L (ref 96–112)
Creatinine, Ser: 0.5 mg/dL (ref 0.4–1.2)
Potassium: 4 mEq/L (ref 3.5–5.1)

## 2012-07-02 LAB — TSH: TSH: 0.53 u[IU]/mL (ref 0.35–5.50)

## 2012-07-02 NOTE — Progress Notes (Signed)
Echocardiogram performed.  

## 2012-07-04 ENCOUNTER — Encounter: Payer: Self-pay | Admitting: Cardiology

## 2012-07-31 ENCOUNTER — Telehealth: Payer: Self-pay | Admitting: *Deleted

## 2012-07-31 MED ORDER — METOPROLOL TARTRATE 25 MG PO TABS: ORAL_TABLET | ORAL | Status: DC

## 2012-07-31 NOTE — Telephone Encounter (Signed)
Dr Shirlee Latch reviewed monitor done 07/04/12-07/17/12. Run of atrial tachycardia with rate up to 150. If still having symptoms could increase Toprol XL to 50mg  AM and 25mg  PM. I spoke with pt and she would like to try increased dose of Toprol XL.

## 2012-08-12 ENCOUNTER — Encounter: Payer: Self-pay | Admitting: Internal Medicine

## 2012-08-12 ENCOUNTER — Ambulatory Visit (INDEPENDENT_AMBULATORY_CARE_PROVIDER_SITE_OTHER): Payer: Medicaid Other | Admitting: Internal Medicine

## 2012-08-12 VITALS — BP 110/64 | HR 72 | Resp 16 | Ht 65.0 in | Wt 153.8 lb

## 2012-08-12 DIAGNOSIS — R55 Syncope and collapse: Secondary | ICD-10-CM

## 2012-08-12 DIAGNOSIS — I498 Other specified cardiac arrhythmias: Secondary | ICD-10-CM

## 2012-08-12 DIAGNOSIS — I471 Supraventricular tachycardia: Secondary | ICD-10-CM

## 2012-08-12 NOTE — Progress Notes (Addendum)
HPI Patient is a 41 yo with a history of palpitations and atrial tach on event monitor.  Hx of syncope.  Seen by [facility] last on 9/25.  Had syncopal spell after coming out of shower.  ER eval signif for low K. Labs were normal  Echo was normal Event monitor showed run of atrial tach up to 150 bpm.  D. McLean recomm increasing metoprolol to XL 50 AM, 25 BPm.  Patient says since she has gone up on meds she still feels heart racing.  When she has that she feels weak  Stays lightheaded a lot  Moving less. Allergies  Allergen Reactions  . Morphine Anaphylaxis, Hives and Swelling  . Shellfish Allergy Anaphylaxis  . Codeine Hives  . Hydrocodone Hives  . Penicillins Other (See Comments)    Yeast infection    Current Outpatient Prescriptions  Medication Sig Dispense Refill  . albuterol (VENTOLIN HFA) 108 (90 BASE) MCG/ACT inhaler Inhale 2 puffs into the lungs every 6 (six) hours as needed. For shortness of breath      . busPIRone (BUSPAR) 15 MG tablet Take 0.5 tablets (7.5 mg total) by mouth 2 (two) times daily.  30 tablet  0  . cetirizine (ZYRTEC) 10 MG tablet Take 10 mg by mouth daily.      Marland Kitchen dexlansoprazole (DEXILANT) 60 MG capsule Take 60 mg by mouth daily.        . diclofenac (VOLTAREN) 75 MG EC tablet Take 75 mg by mouth 2 (two) times daily.        . fluticasone (FLONASE) 50 MCG/ACT nasal spray Place 2 sprays into the nose daily.        . metoprolol succinate (TOPROL-XL) 25 MG 24 hr tablet Take 25 mg by mouth as directed. 2 qam and 1 qhs      . mirtazapine (REMERON) 15 MG tablet Take 15 mg by mouth at bedtime.      Marland Kitchen omeprazole (PRILOSEC) 20 MG capsule Take 20 mg by mouth daily.      . potassium chloride SA (K-DUR,KLOR-CON) 20 MEQ tablet Take 1 tablet (20 mEq total) by mouth daily.  30 tablet  1  . pregabalin (LYRICA) 75 MG capsule Take 75 mg by mouth 2 (two) times daily.      . promethazine (PHENERGAN) 25 MG tablet Take 25 mg by mouth every 8 (eight) hours as needed. For nausea      .  SUMAtriptan (IMITREX) 50 MG tablet Take 1 tablet (50 mg total) by mouth every 2 (two) hours as needed for migraine.  10 tablet  0  . venlafaxine (EFFEXOR-XR) 150 MG 24 hr capsule Take 150 mg by mouth daily.        . [DISCONTINUED] metoprolol succinate (TOPROL XL) 25 MG 24 hr tablet Take 1 tablet (25 mg total) by mouth 2 (two) times daily.  60 tablet  6  . [DISCONTINUED] diltiazem (CARDIZEM SR) 120 MG 12 hr capsule Take 1 capsule (120 mg total) by mouth daily.  30 capsule  6    Past Medical History  Diagnosis Date  . Pneumonia     with cavitation of left lower lobe  . Asthma   . Migraine   . Hot flashes   . Palpitations   . Chest pressure   . Anxiety   . Fibromyalgia     Past Surgical History  Procedure Date  . Tubal ligation 1993    Family History  Problem Relation Age of Onset  . Migraines Daughter   .  Migraines Son   . Hypertension    . Hypertension Mother   . Hypertension Father     History   Social History  . Marital Status: Single    Spouse Name: N/A    Number of Children: N/A  . Years of Education: N/A   Occupational History  . Not on file.   Social History Main Topics  . Smoking status: Current Some Day Smoker -- 0.0 packs/day    Types: Cigarettes  . Smokeless tobacco: Never Used     Comment: quit last month  . Alcohol Use: No  . Drug Use: Yes    Special: Marijuana  . Sexually Active: Not on file   Other Topics Concern  . Not on file   Social History Narrative  . No narrative on file    Review of Systems:  All systems reviewed.  They are negative to the above problem except as previously stated.  Vital Signs: BP 110/64  Pulse 72  Resp 16  Ht 5\' 5"  (1.651 m)  Wt 153 lb 12.8 oz (69.763 kg)  BMI 25.59 kg/m2  Physical Exam Patient  In NAD HEENT:  Normocephalic, atraumatic. EOMI, PERRLA.  Neck: JVP is normal.  No bruits.  Lungs: clear to auscultation. No rales no wheezes.  Heart: Regular rate and rhythm. Normal S1, S2. No S3.   No  significant murmurs. PMI not displaced.  Abdomen:  Supple, nontender. Normal bowel sounds. No masses. No hepatomegaly.  Extremities:   Good distal pulses throughout. No lower extremity edema.  Musculoskeletal :moving all extremities.  Neuro:   alert and oriented x3.  CN II-XII grossly intact.  EKG  SR 67 bpm.   Assessment and Plan:  1.  Palpitations.  Patient had atrial tach rates to 150 on monitor in October.  B Blocker has been increased  Still has symptoms  Not signif better.   WIll review with EP re evaluation.  I would keep her on the same regimen for now.   Stay hydrated.  Be careful for dizziness.  Watch for falling

## 2012-08-12 NOTE — Patient Instructions (Signed)
Will follow up with you by phone.

## 2012-08-16 ENCOUNTER — Ambulatory Visit: Payer: Self-pay | Admitting: Internal Medicine

## 2012-08-23 NOTE — Addendum Note (Signed)
Addended by: Burnett Kanaris A on: 08/23/2012 01:14 PM   Modules accepted: Orders

## 2012-09-06 ENCOUNTER — Emergency Department (HOSPITAL_COMMUNITY): Payer: Medicaid Other

## 2012-09-06 ENCOUNTER — Emergency Department (HOSPITAL_COMMUNITY)
Admission: EM | Admit: 2012-09-06 | Discharge: 2012-09-06 | Disposition: A | Discharge status: Home / Self Care | Payer: Medicaid Other | Attending: Emergency Medicine | Admitting: Emergency Medicine

## 2012-09-06 ENCOUNTER — Encounter (HOSPITAL_COMMUNITY): Payer: Self-pay | Admitting: Emergency Medicine

## 2012-09-06 DIAGNOSIS — J069 Acute upper respiratory infection, unspecified: Secondary | ICD-10-CM | POA: Insufficient documentation

## 2012-09-06 DIAGNOSIS — R05 Cough: Secondary | ICD-10-CM | POA: Insufficient documentation

## 2012-09-06 DIAGNOSIS — R002 Palpitations: Secondary | ICD-10-CM | POA: Insufficient documentation

## 2012-09-06 DIAGNOSIS — G43909 Migraine, unspecified, not intractable, without status migrainosus: Secondary | ICD-10-CM | POA: Insufficient documentation

## 2012-09-06 DIAGNOSIS — Z79899 Other long term (current) drug therapy: Secondary | ICD-10-CM | POA: Insufficient documentation

## 2012-09-06 DIAGNOSIS — J3489 Other specified disorders of nose and nasal sinuses: Secondary | ICD-10-CM | POA: Insufficient documentation

## 2012-09-06 DIAGNOSIS — R0602 Shortness of breath: Secondary | ICD-10-CM | POA: Insufficient documentation

## 2012-09-06 DIAGNOSIS — Z8659 Personal history of other mental and behavioral disorders: Secondary | ICD-10-CM | POA: Insufficient documentation

## 2012-09-06 DIAGNOSIS — Z8701 Personal history of pneumonia (recurrent): Secondary | ICD-10-CM | POA: Insufficient documentation

## 2012-09-06 DIAGNOSIS — Z8739 Personal history of other diseases of the musculoskeletal system and connective tissue: Secondary | ICD-10-CM | POA: Insufficient documentation

## 2012-09-06 DIAGNOSIS — Z8679 Personal history of other diseases of the circulatory system: Secondary | ICD-10-CM | POA: Insufficient documentation

## 2012-09-06 DIAGNOSIS — J45909 Unspecified asthma, uncomplicated: Secondary | ICD-10-CM | POA: Insufficient documentation

## 2012-09-06 DIAGNOSIS — F17200 Nicotine dependence, unspecified, uncomplicated: Secondary | ICD-10-CM | POA: Insufficient documentation

## 2012-09-06 DIAGNOSIS — R059 Cough, unspecified: Secondary | ICD-10-CM | POA: Insufficient documentation

## 2012-09-06 MED ORDER — ACETAMINOPHEN 325 MG PO TABS
650.0000 mg | ORAL_TABLET | Freq: Once | ORAL | Status: AC
  Administered 2012-09-06: 650 mg via ORAL
  Filled 2012-09-06: qty 2

## 2012-09-06 NOTE — ED Provider Notes (Signed)
History  This chart was scribed for Rolan Bucco, MD by Shari Heritage, ED Scribe. The patient was seen in room TR09C/TR09C. Patient's care was started at 1226.  CSN: [id number]  Arrival date & time 09/06/12  1211   First MD Initiated Contact with Patient 09/06/12 1226      Chief Complaint  Patient presents with  . Sore Throat    The history is provided by the patient. No language interpreter was used.    HPI Comments: Katherine [name] is a 41 y.o. female who presents to the Emergency Department complaining of moderate, constant sore throat and chest congestion onset 2-3 days ago. There is associated rhinorrhea, cough, mild SOB, and mild right ear congestion. Patient denies fever, nausea, vomiting, chest pain, abdominal pain, leg swelling or rash. Patient has taken Nyquil with temporary relief from symptoms. Patient denies any sick contacts. Patient has a medical history of pneumonia, asthma, hot flashes, and palpitations. She is a current some day smoker.   Past Medical History  Diagnosis Date  . Pneumonia     with cavitation of left lower lobe  . Asthma   . Migraine   . Hot flashes   . Palpitations   . Chest pressure   . Anxiety   . Fibromyalgia     Past Surgical History  Procedure Date  . Tubal ligation 1993    Family History  Problem Relation Age of Onset  . Migraines Daughter   . Migraines Son   . Hypertension    . Hypertension Mother   . Hypertension Father     History  Substance Use Topics  . Smoking status: Current Some Day Smoker -- 0.0 packs/day    Types: Cigarettes  . Smokeless tobacco: Never Used     Comment: quit last month  . Alcohol Use: No    OB History    Grav Para Term Preterm Abortions TAB SAB Ect Mult Living   2 2 1 1      2       Review of Systems  Constitutional: Negative for fever, chills, diaphoresis and fatigue.  HENT: Positive for congestion, sore throat and rhinorrhea. Negative for sneezing.   Eyes: Negative.   Respiratory:  Positive for cough and shortness of breath. Negative for chest tightness.   Cardiovascular: Negative for chest pain and leg swelling.  Gastrointestinal: Negative for nausea, vomiting, abdominal pain, diarrhea and blood in stool.  Genitourinary: Negative for frequency, hematuria, flank pain and difficulty urinating.  Musculoskeletal: Negative for back pain and arthralgias.  Skin: Negative for rash.  Neurological: Negative for dizziness, speech difficulty, weakness, numbness and headaches.    Allergies  Morphine; Shellfish allergy; Codeine; Hydrocodone; and Penicillins  Home Medications   Current Outpatient Rx  Name  Route  Sig  Dispense  Refill  . ALBUTEROL SULFATE HFA 108 (90 BASE) MCG/ACT IN AERS   Inhalation   Inhale 2 puffs into the lungs every 6 (six) hours as needed. For shortness of breath         . BUSPIRONE HCL 15 MG PO TABS   Oral   Take 0.5 tablets (7.5 mg total) by mouth 2 (two) times daily.   30 tablet   0   . CETIRIZINE HCL 10 MG PO TABS   Oral   Take 10 mg by mouth daily.         . DEXLANSOPRAZOLE 60 MG PO CPDR   Oral   Take 60 mg by mouth daily.           Marland Kitchen  DICLOFENAC SODIUM 75 MG PO TBEC   Oral   Take 75 mg by mouth 2 (two) times daily.           Marland Kitchen FLUTICASONE PROPIONATE 50 MCG/ACT NA SUSP   Nasal   Place 2 sprays into the nose daily.           Marland Kitchen METOPROLOL TARTRATE 25 MG PO TABS   Oral   Take 25 mg by mouth 2 (two) times daily.         Marland Kitchen MIRTAZAPINE 15 MG PO TABS   Oral   Take 15 mg by mouth at bedtime.         . OMEPRAZOLE 20 MG PO CPDR   Oral   Take 20 mg by mouth daily.         Marland Kitchen POTASSIUM CHLORIDE CRYS ER 20 MEQ PO TBCR   Oral   Take 1 tablet (20 mEq total) by mouth daily.   30 tablet   1   . PREGABALIN 75 MG PO CAPS   Oral   Take 75 mg by mouth 2 (two) times daily.         Marland Kitchen PROMETHAZINE HCL 25 MG PO TABS   Oral   Take 25 mg by mouth every 8 (eight) hours as needed. For nausea         . NYQUIL PO   Oral    Take 30 mLs by mouth daily as needed. For symptoms         . SUMATRIPTAN SUCCINATE 50 MG PO TABS   Oral   Take 1 tablet (50 mg total) by mouth every 2 (two) hours as needed for migraine.   10 tablet   0   . VENLAFAXINE HCL ER 150 MG PO CP24   Oral   Take 150 mg by mouth daily.             Triage Vitals: BP 108/71  Pulse 88  Temp 99 F (37.2 C)  Resp 16  SpO2 100%  Physical Exam  Constitutional: She is oriented to person, place, and time. She appears well-developed and well-nourished.  HENT:  Head: Normocephalic and atraumatic.  Right Ear: Tympanic membrane normal.  Left Ear: Tympanic membrane normal.  Mouth/Throat: Posterior oropharyngeal erythema (mild) present. No oropharyngeal exudate.  Eyes: Pupils are equal, round, and reactive to light.  Neck: Normal range of motion. Neck supple.  Cardiovascular: Normal rate, regular rhythm and normal heart sounds.   Pulmonary/Chest: Effort normal and breath sounds normal. No respiratory distress. She has no wheezes. She has no rales. She exhibits no tenderness.  Abdominal: Soft. Bowel sounds are normal. There is no tenderness. There is no rebound and no guarding.  Musculoskeletal: Normal range of motion. She exhibits no edema.  Lymphadenopathy:    She has no cervical adenopathy.  Neurological: She is alert and oriented to person, place, and time.  Skin: Skin is warm and dry. No rash noted.  Psychiatric: She has a normal mood and affect.    ED Course  Procedures (including critical care time) DIAGNOSTIC STUDIES: Oxygen Saturation is 100% on, room air, normal by my interpretation.    COORDINATION OF CARE: 12:39 PM- Patient informed of current plan for treatment and evaluation and agrees with plan at this time.   Results for orders placed during the hospital encounter of 09/06/12  RAPID STREP SCREEN      Component Value Range   Streptococcus, Group A Screen (Direct) NEGATIVE  NEGATIVE   Dg Chest  2 View  09/06/2012   *RADIOLOGY REPORT*  Clinical Data: Cough  CHEST - 2 VIEW  Comparison: 06/03/2012  Findings: Cardiomediastinal silhouette is stable.  No acute infiltrate or pleural effusion.  No pulmonary edema.  Bony thorax is unremarkable.  IMPRESSION: No active disease.   Original Report Authenticated By: Natasha Mead, M.D.        1. URI (upper respiratory infection)       MDM  Patient is well-appearing without evidence of pneumonia or strep throat. This is likely a viral syndrome. Advised in symptomatic care and followup as needed      I personally performed the services described in this documentation, which was scribed in my presence.  The recorded information has been reviewed and considered.     Rolan Bucco, MD 09/06/12 1340

## 2012-09-06 NOTE — ED Notes (Signed)
Hurts to swollow and breath  And cough  X 2 days nyquil makes her feel better but it comes back

## 2012-10-28 NOTE — Progress Notes (Signed)
This encounter was created in error - please disregard.

## 2012-12-02 ENCOUNTER — Other Ambulatory Visit: Payer: Self-pay | Admitting: Cardiology

## 2012-12-16 ENCOUNTER — Encounter (HOSPITAL_COMMUNITY): Payer: Self-pay

## 2012-12-16 ENCOUNTER — Emergency Department (HOSPITAL_COMMUNITY)
Admission: EM | Admit: 2012-12-16 | Discharge: 2012-12-16 | Disposition: A | Discharge status: Home / Self Care | Payer: Medicaid Other | Source: Home / Self Care | Attending: Emergency Medicine | Admitting: Emergency Medicine

## 2012-12-16 ENCOUNTER — Other Ambulatory Visit: Payer: Self-pay

## 2012-12-16 DIAGNOSIS — J309 Allergic rhinitis, unspecified: Secondary | ICD-10-CM

## 2012-12-16 DIAGNOSIS — K219 Gastro-esophageal reflux disease without esophagitis: Secondary | ICD-10-CM

## 2012-12-16 DIAGNOSIS — R42 Dizziness and giddiness: Secondary | ICD-10-CM

## 2012-12-16 DIAGNOSIS — R51 Headache: Secondary | ICD-10-CM

## 2012-12-16 MED ORDER — LORATADINE 10 MG PO TABS: 10.0000 mg | ORAL_TABLET | Freq: Every day | ORAL | Status: DC

## 2012-12-16 MED ORDER — OMEPRAZOLE 20 MG PO CPDR: 20.0000 mg | DELAYED_RELEASE_CAPSULE | Freq: Every day | ORAL | Status: DC

## 2012-12-16 NOTE — ED Notes (Addendum)
C/o abdominal pain, HA, palpitations. NAD at present; c/o her syx for 1 month or more c/o she is out of her medications, and would like an x-ray of her brain since something is rattling around in there

## 2012-12-16 NOTE — ED Provider Notes (Signed)
Medical screening examination/treatment/procedure(s) were performed by non-physician practitioner and as supervising physician I was immediately available for consultation/collaboration.  Raynald Blend, MD 12/16/12 1230

## 2012-12-16 NOTE — ED Provider Notes (Signed)
History     CSN: [id number]  Arrival date & time 12/16/12  1011   First MD Initiated Contact with Patient 12/16/12 1159      Chief Complaint  Patient presents with  . Abdominal Pain    (Consider location/radiation/quality/duration/timing/severity/associated sxs/prior treatment) HPI Comments: Pt reports she is out of her acid reflux medicine and gets burning epigastric pain and nausea when she doesn't take it.  Pt also c/o headaches, has been getting for years, no different than previous except lately feels they are a little stronger with the season change/things blooming. Is not taking any allergy medicine.  Also c/o dizziness occasionally and palpitations occasionally.  Takes toprol 50mg  qam and 25mg  qpm for arrhythmia.    Patient is a 42 y.o. female presenting with abdominal pain and headaches. The history is provided by the patient.  Abdominal Pain Pain location:  Epigastric Pain quality: burning   Pain radiates to:  Does not radiate Pain severity:  Moderate Onset quality:  Gradual Duration: several years. Timing:  Intermittent Progression:  Unchanged Chronicity:  Chronic Context: eating   Relieved by: own acid reflux medicine. Worsened by:  Eating Associated symptoms: nausea   Associated symptoms: no chest pain, no chills, no constipation, no diarrhea, no fever and no vomiting   Headache Location: sinuses and top of head. Quality:  Dull Radiates to:  Does not radiate Severity currently:  0/10 Onset quality:  Gradual Duration: several years. Timing: episodic. Progression:  Worsening (worsening severity recently) Chronicity:  Chronic Similar to prior headaches: yes   Context: bright light and loud noise   Context comment:  Worse recently with seasonal allergies Relieved by:  Nothing Worsened by:  Light and sound Ineffective treatments:  None tried (pt reports she used to take migraine medicine but it didn't help) Associated symptoms: abdominal pain, congestion,  drainage, nausea and sinus pressure   Associated symptoms: no diarrhea, no fever and no vomiting     Past Medical History  Diagnosis Date  . Pneumonia     with cavitation of left lower lobe  . Asthma   . Migraine   . Hot flashes   . Palpitations   . Chest pressure   . Anxiety   . Fibromyalgia     Past Surgical History  Procedure Laterality Date  . Tubal ligation  1993    Family History  Problem Relation Age of Onset  . Migraines Daughter   . Migraines Son   . Hypertension    . Hypertension Mother   . Hypertension Father     History  Substance Use Topics  . Smoking status: Current Some Day Smoker -- 0.05 packs/day    Types: Cigarettes  . Smokeless tobacco: Never Used     Comment: quit last month  . Alcohol Use: No    OB History   Grav Para Term Preterm Abortions TAB SAB Ect Mult Living   2 2 1 1      2       Review of Systems  Constitutional: Negative for fever and chills.  HENT: Positive for congestion, rhinorrhea, postnasal drip and sinus pressure.   Cardiovascular: Positive for palpitations. Negative for chest pain.  Gastrointestinal: Positive for nausea and abdominal pain. Negative for vomiting, diarrhea and constipation.  Neurological: Positive for light-headedness and headaches. Negative for syncope.    Allergies  Morphine; Shellfish allergy; Codeine; Hydrocodone; and Penicillins  Home Medications   Current Outpatient Rx  Name  Route  Sig  Dispense  Refill  . albuterol (  VENTOLIN HFA) 108 (90 BASE) MCG/ACT inhaler   Inhalation   Inhale 2 puffs into the lungs every 6 (six) hours as needed. For shortness of breath         . busPIRone (BUSPAR) 15 MG tablet   Oral   Take 0.5 tablets (7.5 mg total) by mouth 2 (two) times daily.   30 tablet   0   . cetirizine (ZYRTEC) 10 MG tablet   Oral   Take 10 mg by mouth daily.         Marland Kitchen dexlansoprazole (DEXILANT) 60 MG capsule   Oral   Take 60 mg by mouth daily.           . diclofenac (VOLTAREN)  75 MG EC tablet   Oral   Take 75 mg by mouth 2 (two) times daily.           . fluticasone (FLONASE) 50 MCG/ACT nasal spray   Nasal   Place 2 sprays into the nose daily.           Marland Kitchen KLOR-CON M20 20 MEQ tablet      TAKE ONE TABLET BY MOUTH EVERY DAY   30 tablet   0   . loratadine (CLARITIN) 10 MG tablet   Oral   Take 1 tablet (10 mg total) by mouth daily.   30 tablet   2   . metoprolol tartrate (LOPRESSOR) 25 MG tablet   Oral   Take 25 mg by mouth 2 (two) times daily.         . mirtazapine (REMERON) 15 MG tablet   Oral   Take 15 mg by mouth at bedtime.         Marland Kitchen omeprazole (PRILOSEC) 20 MG capsule   Oral   Take 1 capsule (20 mg total) by mouth daily.   30 capsule   2   . pregabalin (LYRICA) 75 MG capsule   Oral   Take 75 mg by mouth 2 (two) times daily.         . promethazine (PHENERGAN) 25 MG tablet   Oral   Take 25 mg by mouth every 8 (eight) hours as needed. For nausea         . Pseudoeph-Doxylamine-DM-APAP (NYQUIL PO)   Oral   Take 30 mLs by mouth daily as needed. For symptoms         . SUMAtriptan (IMITREX) 50 MG tablet   Oral   Take 1 tablet (50 mg total) by mouth every 2 (two) hours as needed for migraine.   10 tablet   0   . venlafaxine (EFFEXOR-XR) 150 MG 24 hr capsule   Oral   Take 150 mg by mouth daily.             BP 99/67  Pulse 57  Temp(Src) 98.9 F (37.2 C) (Oral)  Resp 16  SpO2 99%  Physical Exam  Constitutional: She is oriented to person, place, and time. She appears well-developed and well-nourished. No distress.  HENT:  Head: Normocephalic and atraumatic.  Right Ear: External ear and ear canal normal. A middle ear effusion is present.  Left Ear: External ear and ear canal normal. A middle ear effusion is present.  Nose: Mucosal edema and rhinorrhea present. Right sinus exhibits maxillary sinus tenderness and frontal sinus tenderness. Left sinus exhibits maxillary sinus tenderness and frontal sinus tenderness.   Mouth/Throat: Oropharynx is clear and moist and mucous membranes are normal.  Cardiovascular: Regular rhythm.   Sinus brady on ekg,  otherwise normal ekg  Pulmonary/Chest: Effort normal and breath sounds normal.  Abdominal: Soft. Bowel sounds are normal. She exhibits no distension. There is tenderness in the epigastric area. There is no rigidity, no rebound, no guarding and no CVA tenderness.  Mild epigastric tender to palp  Neurological: She is alert and oriented to person, place, and time. Gait normal.    ED Course  Procedures (including critical care time)  Labs Reviewed - No data to display No results found.   1. GERD (gastroesophageal reflux disease)   2. Lightheadedness   3. Headache   4. Allergic rhinitis       MDM  Pt's occasional dizziness likely r/t toprol- per previous [place] [facility] notes, this is not a new sx/occurrence.  Pt to f/u with cards if dizziness bothers her.  Headaches are long standing problem not significantly changed from usual. Increase in severity likely r/t allergic rhinitis.  Rx claritin.  Abd pain r/t to gerd, rx prilosec.  Pt without pcp, [facility], pt [facility] clinic.          Cathlyn Parsons, NP 12/16/12 [phone]

## 2012-12-20 ENCOUNTER — Emergency Department (HOSPITAL_COMMUNITY)
Admission: EM | Admit: 2012-12-20 | Discharge: 2012-12-20 | Disposition: A | Discharge status: Home / Self Care | Payer: Medicaid Other | Source: Home / Self Care | Attending: Family Medicine | Admitting: Family Medicine

## 2012-12-20 ENCOUNTER — Encounter (HOSPITAL_COMMUNITY): Payer: Self-pay | Admitting: *Deleted

## 2012-12-20 DIAGNOSIS — F4322 Adjustment disorder with anxiety: Secondary | ICD-10-CM

## 2012-12-20 DIAGNOSIS — R51 Headache: Secondary | ICD-10-CM

## 2012-12-20 DIAGNOSIS — K219 Gastro-esophageal reflux disease without esophagitis: Secondary | ICD-10-CM

## 2012-12-20 DIAGNOSIS — J309 Allergic rhinitis, unspecified: Secondary | ICD-10-CM

## 2012-12-20 DIAGNOSIS — R519 Headache, unspecified: Secondary | ICD-10-CM | POA: Insufficient documentation

## 2012-12-20 DIAGNOSIS — R002 Palpitations: Secondary | ICD-10-CM

## 2012-12-20 MED ORDER — DEXLANSOPRAZOLE 30 MG PO CPDR: 30.0000 mg | DELAYED_RELEASE_CAPSULE | Freq: Every day | ORAL | Status: DC

## 2012-12-20 NOTE — ED Notes (Signed)
Patient has an appt for ct @ [place] Monday 12/23/2012 at 10 am Please arrive at 9:45 am

## 2012-12-20 NOTE — ED Provider Notes (Signed)
History   CSN: [id number]  Arrival date & time 12/20/12  1246   First MD Initiated Contact with Patient 12/20/12 1405     Chief Complaint  Patient presents with  . Follow-up   HPI Pt reports that she is having difficult with acid reflux and reports that dexilant doesn't help with her symptoms.   Pt says that she was taking omeprazole as prescribed from the urgent care but it is not helping with her symptoms.  She is reporting that she has chronic headaches and reports that she is demanding that her brain is imaged.  She says a family member was recently diagnosed with brain cancer.  She reports that she has controlled asthma and is taking her medications. She recently received medicaid benefits and reports that she wants to be treated for her anxiety.    Past Medical History  Diagnosis Date  . Pneumonia     with cavitation of left lower lobe  . Asthma   . Migraine   . Hot flashes   . Palpitations   . Chest pressure   . Anxiety   . Fibromyalgia     Past Surgical History  Procedure Laterality Date  . Tubal ligation  1993    Family History  Problem Relation Age of Onset  . Migraines Daughter   . Migraines Son   . Hypertension    . Hypertension Mother   . Hypertension Father     History  Substance Use Topics  . Smoking status: Current Some Day Smoker -- 0.05 packs/day    Types: Cigarettes  . Smokeless tobacco: Never Used     Comment: quit last month  . Alcohol Use: No    OB History   Grav Para Term Preterm Abortions TAB SAB Ect Mult Living   2 2 1 1      2       Review of Systems  Respiratory: Positive for cough and chest tightness.   Gastrointestinal:       Severe acid reflux  All other systems reviewed and are negative.    Allergies  Morphine; Shellfish allergy; Codeine; Hydrocodone; and Penicillins  Home Medications   Current Outpatient Rx  Name  Route  Sig  Dispense  Refill  . albuterol (VENTOLIN HFA) 108 (90 BASE) MCG/ACT inhaler   Inhalation  Inhale 2 puffs into the lungs every 6 (six) hours as needed. For shortness of breath         . busPIRone (BUSPAR) 15 MG tablet   Oral   Take 0.5 tablets (7.5 mg total) by mouth 2 (two) times daily.   30 tablet   0   . cetirizine (ZYRTEC) 10 MG tablet   Oral   Take 10 mg by mouth daily.         Marland Kitchen dexlansoprazole (DEXILANT) 60 MG capsule   Oral   Take 60 mg by mouth daily.           . diclofenac (VOLTAREN) 75 MG EC tablet   Oral   Take 75 mg by mouth 2 (two) times daily.           . fluticasone (FLONASE) 50 MCG/ACT nasal spray   Nasal   Place 2 sprays into the nose daily.           Marland Kitchen KLOR-CON M20 20 MEQ tablet      TAKE ONE TABLET BY MOUTH EVERY DAY   30 tablet   0   . loratadine (CLARITIN) 10 MG tablet  Oral   Take 1 tablet (10 mg total) by mouth daily.   30 tablet   2   . metoprolol tartrate (LOPRESSOR) 25 MG tablet   Oral   Take 25 mg by mouth 2 (two) times daily.         . mirtazapine (REMERON) 15 MG tablet   Oral   Take 15 mg by mouth at bedtime.         Marland Kitchen omeprazole (PRILOSEC) 20 MG capsule   Oral   Take 1 capsule (20 mg total) by mouth daily.   30 capsule   2   . pregabalin (LYRICA) 75 MG capsule   Oral   Take 75 mg by mouth 2 (two) times daily.         . promethazine (PHENERGAN) 25 MG tablet   Oral   Take 25 mg by mouth every 8 (eight) hours as needed. For nausea         . Pseudoeph-Doxylamine-DM-APAP (NYQUIL PO)   Oral   Take 30 mLs by mouth daily as needed. For symptoms         . SUMAtriptan (IMITREX) 50 MG tablet   Oral   Take 1 tablet (50 mg total) by mouth every 2 (two) hours as needed for migraine.   10 tablet   0   . venlafaxine (EFFEXOR-XR) 150 MG 24 hr capsule   Oral   Take 150 mg by mouth daily.             BP 98/59  Pulse 69  Temp(Src) 97.9 F (36.6 C) (Oral)  SpO2 100%  Physical Exam  Nursing note and vitals reviewed. Constitutional: She is oriented to person, place, and time. She appears  well-developed and well-nourished. No distress.  HENT:  Head: Normocephalic and atraumatic.  Eyes: Conjunctivae and EOM are normal. Pupils are equal, round, and reactive to light.  Neck: Normal range of motion. Neck supple.  Cardiovascular: Normal rate, regular rhythm and normal heart sounds.   No murmur heard. Pulmonary/Chest: Effort normal and breath sounds normal. No respiratory distress.  Abdominal: Soft. Bowel sounds are normal.  Musculoskeletal: Normal range of motion.  Neurological: She is alert and oriented to person, place, and time. She has normal reflexes. No cranial nerve deficit. Coordination normal.  Skin: Skin is warm and dry.  Psychiatric: She has a normal mood and affect. Her behavior is normal. Judgment and thought content normal.    ED Course  Procedures (including critical care time)  Labs Reviewed - No data to display No results found.  No diagnosis found.  MDM  IMPRESSION  Hypertension  palpatations   Allergic rhinitis  Severe GERD  Anxiety Disorder  Severe chronic headaches (likely migraine)  RECOMMENDATIONS / PLAN DC omeprazole, trial returning to dexilant 60 mg po daily but warned patient that medicaid may not pay for it. Also advised that she take 2 or 3 omeprazole tabs daily to control symptoms but patient did not want to do that, she wanted to get dexilant.  Pt was referred to [facility] for treatment of her anxiety disoder. CT head without contrast to rule out pathology Pt to follow up with cardiology  FOLLOW UP 1 month  The patient was given clear instructions to go to ER or return to medical center if symptoms don't improve, worsen or new problems develop.  The patient verbalized understanding.  The patient was told to call to get lab results if they haven't heard anything in the next week.  Cleora Fleet, MD 12/21/12 1040

## 2012-12-20 NOTE — ED Notes (Signed)
Pt c/o epigastric pain and "heart problems".  Palpitations.  Seen at [facility].

## 2012-12-23 ENCOUNTER — Encounter: Payer: Self-pay | Admitting: Gastroenterology

## 2012-12-23 ENCOUNTER — Ambulatory Visit (HOSPITAL_COMMUNITY): Payer: Medicaid Other

## 2012-12-23 NOTE — ED Notes (Signed)
Referral faxed to GI for severe gerd appt-4/28 1:30 pm

## 2012-12-29 ENCOUNTER — Emergency Department (HOSPITAL_COMMUNITY): Payer: Medicaid Other

## 2012-12-29 ENCOUNTER — Emergency Department (HOSPITAL_COMMUNITY)
Admission: EM | Admit: 2012-12-29 | Discharge: 2012-12-29 | Disposition: A | Discharge status: Home / Self Care | Payer: Medicaid Other | Attending: Emergency Medicine | Admitting: Emergency Medicine

## 2012-12-29 ENCOUNTER — Encounter (HOSPITAL_COMMUNITY): Payer: Self-pay | Admitting: Emergency Medicine

## 2012-12-29 DIAGNOSIS — Z79899 Other long term (current) drug therapy: Secondary | ICD-10-CM | POA: Insufficient documentation

## 2012-12-29 DIAGNOSIS — J45909 Unspecified asthma, uncomplicated: Secondary | ICD-10-CM | POA: Insufficient documentation

## 2012-12-29 DIAGNOSIS — Z8701 Personal history of pneumonia (recurrent): Secondary | ICD-10-CM | POA: Insufficient documentation

## 2012-12-29 DIAGNOSIS — K219 Gastro-esophageal reflux disease without esophagitis: Secondary | ICD-10-CM

## 2012-12-29 DIAGNOSIS — Z8679 Personal history of other diseases of the circulatory system: Secondary | ICD-10-CM | POA: Insufficient documentation

## 2012-12-29 DIAGNOSIS — G43909 Migraine, unspecified, not intractable, without status migrainosus: Secondary | ICD-10-CM | POA: Insufficient documentation

## 2012-12-29 DIAGNOSIS — IMO0002 Reserved for concepts with insufficient information to code with codable children: Secondary | ICD-10-CM | POA: Insufficient documentation

## 2012-12-29 DIAGNOSIS — J029 Acute pharyngitis, unspecified: Secondary | ICD-10-CM | POA: Insufficient documentation

## 2012-12-29 DIAGNOSIS — F411 Generalized anxiety disorder: Secondary | ICD-10-CM | POA: Insufficient documentation

## 2012-12-29 DIAGNOSIS — Z8742 Personal history of other diseases of the female genital tract: Secondary | ICD-10-CM | POA: Insufficient documentation

## 2012-12-29 DIAGNOSIS — R079 Chest pain, unspecified: Secondary | ICD-10-CM | POA: Insufficient documentation

## 2012-12-29 DIAGNOSIS — F17200 Nicotine dependence, unspecified, uncomplicated: Secondary | ICD-10-CM | POA: Insufficient documentation

## 2012-12-29 LAB — CBC
HCT: 34.4 % — ABNORMAL LOW (ref 36.0–46.0)
Hemoglobin: 11.5 g/dL — ABNORMAL LOW (ref 12.0–15.0)
MCV: 79.1 fL (ref 78.0–100.0)
RBC: 4.35 MIL/uL (ref 3.87–5.11)
WBC: 5.9 10*3/uL (ref 4.0–10.5)

## 2012-12-29 LAB — COMPREHENSIVE METABOLIC PANEL
Alkaline Phosphatase: 86 U/L (ref 39–117)
BUN: 8 mg/dL (ref 6–23)
CO2: 24 mEq/L (ref 19–32)
Chloride: 107 mEq/L (ref 96–112)
Creatinine, Ser: 0.58 mg/dL (ref 0.50–1.10)
GFR calc non Af Amer: >90 mL/min (ref >90)
Glucose, Bld: 101 mg/dL — ABNORMAL HIGH (ref 70–99)
Total Bilirubin: 0.1 mg/dL — ABNORMAL LOW (ref 0.3–1.2)

## 2012-12-29 LAB — LIPASE, BLOOD: Lipase: 37 U/L (ref 11–59)

## 2012-12-29 MED ORDER — OXYCODONE-ACETAMINOPHEN 5-325 MG PO TABS
1.0000 | ORAL_TABLET | Freq: Once | ORAL | Status: AC
  Administered 2012-12-29: 1 via ORAL
  Filled 2012-12-29: qty 1

## 2012-12-29 MED ORDER — GI COCKTAIL ~~LOC~~
30.0000 mL | Freq: Once | ORAL | Status: AC
  Administered 2012-12-29: 30 mL via ORAL
  Filled 2012-12-29: qty 30

## 2012-12-29 MED ORDER — DEXLANSOPRAZOLE 30 MG PO CPDR: 30.0000 mg | DELAYED_RELEASE_CAPSULE | Freq: Every day | ORAL | Status: DC

## 2012-12-29 MED ORDER — DEXLANSOPRAZOLE 60 MG PO CPDR: 60.0000 mg | DELAYED_RELEASE_CAPSULE | Freq: Every day | ORAL | Status: DC

## 2012-12-29 NOTE — ED Notes (Signed)
Erin, PA back at the bedside.

## 2012-12-29 NOTE — ED Notes (Signed)
Pt. Stated, i've been having upper abd. Pain. For a week.

## 2012-12-29 NOTE — ED Notes (Signed)
Erin, PA at the bedside.  

## 2012-12-29 NOTE — ED Provider Notes (Signed)
History     CSN: [id number]  Arrival date & time 12/29/12  1200   First MD Initiated Contact with Patient 12/29/12 1214      Chief Complaint  Patient presents with  . Abdominal Pain    (Consider location/radiation/quality/duration/timing/severity/associated sxs/prior treatment) Patient is a 42 y.o. female presenting with abdominal pain. The history is provided by the patient. No language interpreter was used.  Abdominal Pain Pain location:  Epigastric Pain quality: bloating and burning   Pain radiates to:  Does not radiate Pain severity:  Moderate Onset quality:  Gradual Timing:  Constant Progression:  Unchanged Chronicity:  Recurrent Context: eating   Relieved by: sitting up. Exacerbated by: lying flat. Ineffective treatments:  Belching and antacids Associated symptoms: chest pain and sore throat   Pt is a 42yo female presenting with increased epigastric pain over the past week.  Pt was seen in ED last month for similar complaint.  Pt states she she has acid reflux, which use to be well controlled with Dexilant but after switching to urgent care, pt states they took her off that medication and put her on prilosec, which does not work.  Pain is a burning, full, sensation in epigastrium that radiates to her back.  PMH fibromyalgia.  FH sig for cholecystitis.    Past Medical History  Diagnosis Date  . Pneumonia     with cavitation of left lower lobe  . Asthma   . Migraine   . Hot flashes   . Palpitations   . Chest pressure   . Anxiety   . Fibromyalgia     Past Surgical History  Procedure Laterality Date  . Tubal ligation  1993    Family History  Problem Relation Age of Onset  . Migraines Daughter   . Migraines Son   . Hypertension    . Hypertension Mother   . Hypertension Father     History  Substance Use Topics  . Smoking status: Current Some Day Smoker -- 0.05 packs/day    Types: Cigarettes  . Smokeless tobacco: Never Used     Comment: quit last month   . Alcohol Use: No    OB History   Grav Para Term Preterm Abortions TAB SAB Ect Mult Living   2 2 1 1      2       Review of Systems  HENT: Positive for sore throat.   Cardiovascular: Positive for chest pain.  Gastrointestinal: Positive for abdominal pain.    Allergies  Morphine; Shellfish allergy; Codeine; Hydrocodone; and Penicillins  Home Medications   Current Outpatient Rx  Name  Route  Sig  Dispense  Refill  . albuterol (VENTOLIN HFA) 108 (90 BASE) MCG/ACT inhaler   Inhalation   Inhale 2 puffs into the lungs every 6 (six) hours as needed. For shortness of breath         . busPIRone (BUSPAR) 15 MG tablet   Oral   Take 0.5 tablets (7.5 mg total) by mouth 2 (two) times daily.   30 tablet   0   . cetirizine (ZYRTEC) 10 MG tablet   Oral   Take 10 mg by mouth daily.         . fluticasone (FLONASE) 50 MCG/ACT nasal spray   Nasal   Place 2 sprays into the nose daily.           Marland Kitchen loratadine (CLARITIN) 10 MG tablet   Oral   Take 1 tablet (10 mg total) by mouth daily.  30 tablet   2   . metoprolol tartrate (LOPRESSOR) 25 MG tablet   Oral   Take 25 mg by mouth 2 (two) times daily.         Marland Kitchen omeprazole (PRILOSEC) 20 MG capsule   Oral   Take 1 capsule (20 mg total) by mouth daily.   30 capsule   2   . potassium chloride SA (K-DUR,KLOR-CON) 20 MEQ tablet   Oral   Take 20 mEq by mouth daily.         . pregabalin (LYRICA) 75 MG capsule   Oral   Take 75 mg by mouth 2 (two) times daily.         . SUMAtriptan (IMITREX) 50 MG tablet   Oral   Take 1 tablet (50 mg total) by mouth every 2 (two) hours as needed for migraine.   10 tablet   0   . venlafaxine (EFFEXOR-XR) 150 MG 24 hr capsule   Oral   Take 150 mg by mouth daily.          Marland Kitchen Dexlansoprazole 30 MG capsule   Oral   Take 1 capsule (30 mg total) by mouth daily.   30 capsule   3     BP 115/73  Pulse 59  Temp(Src) 98.7 F (37.1 C) (Oral)  Resp 14  SpO2 100%  LMP  12/22/2012  Physical Exam  Nursing note and vitals reviewed. Constitutional: She appears well-developed and well-nourished. No distress.  HENT:  Head: Normocephalic and atraumatic.  Mouth/Throat: Oropharynx is clear and moist. No oropharyngeal exudate.  Eyes: Conjunctivae are normal. No scleral icterus.  Neck: Normal range of motion. Neck supple. No JVD present. No tracheal deviation present. No thyromegaly present.  Cardiovascular: Normal rate, regular rhythm and normal heart sounds.   Pulmonary/Chest: Effort normal and breath sounds normal. No stridor. No respiratory distress. She has no wheezes. She has no rales. She exhibits no tenderness.  Abdominal: Soft. Bowel sounds are normal. She exhibits no distension and no mass. There is tenderness ( TTP in epigastrium and LUQ). There is no rebound and no guarding.  Musculoskeletal: Normal range of motion.  Lymphadenopathy:    She has no cervical adenopathy.  Neurological: She is alert.  Skin: Skin is warm and dry. She is not diaphoretic.    ED Course  Procedures (including critical care time)  Labs Reviewed  CBC - Abnormal; Notable for the following:    Hemoglobin 11.5 (*)    HCT 34.4 (*)    All other components within normal limits  COMPREHENSIVE METABOLIC PANEL - Abnormal; Notable for the following:    Glucose, Bld 101 (*)    Total Bilirubin 0.1 (*)    All other components within normal limits  LIPASE, BLOOD   US Abdomen Complete  12/29/2012  *RADIOLOGY REPORT*  Clinical Data:  Pain.  Evaluate for gallstones.  ABDOMINAL ULTRASOUND COMPLETE  Comparison:  None.  Findings:  Gallbladder:  No gallstones, gallbladder wall thickening, or pericholecystic fluid.  Common Bile Duct:  Within normal limits in caliber.  Liver: No focal mass lesion identified.  Within normal limits in parenchymal echogenicity.  IVC:  Appears normal.  Pancreas:  No abnormality identified.  Spleen:  Within normal limits in size and echotexture.  Right kidney:  Normal  in size and parenchymal echogenicity.  No evidence of mass or hydronephrosis.  Left kidney:  Normal in size and parenchymal echogenicity.  No evidence of mass or hydronephrosis.  Abdominal Aorta:  No aneurysm identified.  IMPRESSION: Negative abdominal ultrasound.   Original Report Authenticated By: Signa Kell, M.D.      1. GERD (gastroesophageal reflux disease)       MDM  Pt presented with exacerbation of GERD.  States has been getting increased pain after being switched from Dexilant to Prilosec due to insurance.  Pt insists on paying for the medication out of pocket because she claims it is the only thing that works.    Discussed pt with Dr. Denton Lank.  He believed pt should be worked up for cholecystitis due to FH and duration of pain that radiates to the back.  Labs not suggestive of cholecystitis.  [place]: nl   Will tx for GERD.  Rx: Dexilant.  Advised pt to f/u with PCP to discuss other options for GERD management.  Also gave pt additional information on diet for GERD and told the pt it is important to take her GERD medication daily.   Vitals: unremarkable. Discharged in stable condition.    Discussed pt with attending during ED encounter.             Junius Finner, PA-C 12/29/12 1506

## 2013-01-01 NOTE — ED Provider Notes (Signed)
Medical screening examination/treatment/procedure(s) were conducted as a shared visit with non-physician practitioner(s) and myself.  I personally evaluated the patient during the encounter Pt with upper abd pain, occ radiates to back. Mild ruq tenderness. Labs. U/s.   Suzi Roots, MD 01/01/13 2141

## 2013-01-20 ENCOUNTER — Ambulatory Visit (INDEPENDENT_AMBULATORY_CARE_PROVIDER_SITE_OTHER): Payer: Medicaid Other | Admitting: Gastroenterology

## 2013-01-20 ENCOUNTER — Encounter: Payer: Self-pay | Admitting: Gastroenterology

## 2013-01-20 VITALS — BP 98/58 | HR 68 | Ht 64.76 in | Wt 146.0 lb

## 2013-01-20 DIAGNOSIS — R12 Heartburn: Secondary | ICD-10-CM

## 2013-01-20 MED ORDER — MOVIPREP 100 G PO SOLR: 1.0000 | Freq: Once | ORAL | Status: DC

## 2013-01-20 MED ORDER — PANTOPRAZOLE SODIUM 40 MG PO TBEC: 40.0000 mg | DELAYED_RELEASE_TABLET | Freq: Every day | ORAL | Status: DC

## 2013-01-20 NOTE — Progress Notes (Signed)
HPI: This is a  Pleasant 42 yo woman here with her husband today.  First time seeing her.  Has pyrosis.  Feels nauseas.  Not vomiting.    Has had GERD problems for many years.    Used to be on dexilant, and this helped.  [facility] does not pay for it, switched to omeprazole bid.  Two in AM: usually 1 hour before BF.  Takes 2 pills in PM, one hour before dinner.    + dysphagia intermittently.   Also early satiety.  Trying to quit smoking.  Rare peppermints, chocolate, caffeine. Rare etoh.  Weight down, probably 10-15 in past year.  [place] 12/2012 was normal.  Cbc, cmet both essentially normal except slightly low HB (11.5) normocytic.  Burns after eating.    Usually has to have BM shortly after eating.   Review of systems: Pertinent positive and negative review of systems were noted in the above HPI section. Complete review of systems was performed and was otherwise normal.    Past Medical History  Diagnosis Date  . Pneumonia     with cavitation of left lower lobe  . Asthma   . Migraine   . Hot flashes   . Palpitations   . Chest pressure   . Anxiety   . Fibromyalgia     Past Surgical History  Procedure Laterality Date  . Tubal ligation  1993    Current Outpatient Prescriptions  Medication Sig Dispense Refill  . albuterol (VENTOLIN HFA) 108 (90 BASE) MCG/ACT inhaler Inhale 2 puffs into the lungs every 6 (six) hours as needed. For shortness of breath      . busPIRone (BUSPAR) 15 MG tablet Take 0.5 tablets (7.5 mg total) by mouth 2 (two) times daily.  30 tablet  0  . cetirizine (ZYRTEC) 10 MG tablet Take 10 mg by mouth daily.      . fluticasone (FLONASE) 50 MCG/ACT nasal spray Place 2 sprays into the nose daily.        . hydrocortisone cream 1 % Apply 1 application topically as needed.      . loratadine (CLARITIN) 10 MG tablet Take 1 tablet (10 mg total) by mouth daily.  30 tablet  2  . metoprolol tartrate (LOPRESSOR) 25 MG tablet Take 25 mg by mouth 2 (two) times  daily.      Marland Kitchen omeprazole (PRILOSEC) 20 MG capsule Take 20 mg by mouth 2 (two) times daily.      . potassium chloride SA (K-DUR,KLOR-CON) 20 MEQ tablet Take 20 mEq by mouth daily.      . pregabalin (LYRICA) 75 MG capsule Take 75 mg by mouth 2 (two) times daily.      . SUMAtriptan (IMITREX) 50 MG tablet Take 50 mg by mouth daily.      Marland Kitchen venlafaxine (EFFEXOR-XR) 150 MG 24 hr capsule Take 150 mg by mouth daily.       . [DISCONTINUED] diltiazem (CARDIZEM SR) 120 MG 12 hr capsule Take 1 capsule (120 mg total) by mouth daily.  30 capsule  6   No current facility-administered medications for this visit.    Allergies as of 01/20/2013 - Review Complete 01/20/2013  Allergen Reaction Noted  . Morphine Anaphylaxis, Hives, and Swelling 01/27/2008  . Shellfish allergy Anaphylaxis 06/19/2012  . Codeine Hives 01/22/2012  . Hydrocodone Hives 05/03/2011  . Penicillins Other (See Comments) 06/03/2012    Family History  Problem Relation Age of Onset  . Migraines Daughter   . Migraines Son   .  Hypertension    . Hypertension Mother   . Hypertension Father   . Colon cancer Neg Hx   . Colon polyps Neg Hx   . Diabetes Neg Hx   . Heart disease Mother   . Heart disease Father   . Kidney disease Neg Hx   . Liver disease Neg Hx     History   Social History  . Marital Status: Single    Spouse Name: N/A    Number of Children: N/A  . Years of Education: N/A   Occupational History  . Not on file.   Social History Main Topics  . Smoking status: Current Some Day Smoker -- 0.05 packs/day    Types: Cigarettes  . Smokeless tobacco: Never Used     Comment: form given 01-20-13  . Alcohol Use: No  . Drug Use: No     Comment: pt states she has d/c marijuana  . Sexually Active: Not on file   Other Topics Concern  . Not on file   Social History Narrative  . No narrative on file       Physical Exam: BP 98/58  Pulse 68  Ht 5' 4.76" (1.645 m)  Wt 146 lb (66.225 kg)  BMI 24.47 kg/m2  LMP  12/22/2012 Constitutional: generally well-appearing Psychiatric: alert and oriented x3 Eyes: extraocular movements intact Mouth: oral pharynx moist, no lesions Neck: supple no lymphadenopathy Cardiovascular: heart regular rate and rhythm Lungs: clear to auscultation bilaterally Abdomen: soft, nontender, nondistended, no obvious ascites, no peritoneal signs, normal bowel sounds Extremities: no lower extremity edema bilaterally Skin: no lesions on visible extremities    Assessment and plan: 42 y.o. female with  Chronic gerd, new early satiety, dysphagia, weight loss  Will proceed with EGD at her soonest convenience for the above indications.  Will try pantoprazole 40mg  once daily, stopping omeprazole for now.

## 2013-01-20 NOTE — Patient Instructions (Addendum)
One of your biggest health concerns is your smoking.  This increases your risk for most cancers and serious cardiovascular diseases such as strokes, heart attacks.  You should try your best to stop.  If you need assistance, please contact your PCP or Smoking Cessation Class at [facility] [phone]) or [facility] Quit-Line (1-800-QUIT-NOW).  Smoking also makes acid symptoms worse. You will be set up for an upper endoscopy for pyrosis (moderate sedation, LEC). New prescription for pantoprazole called in. Take one pill 20-30 min before BF.                                               We are excited to introduce MyChart, a new best-in-class service that provides you online access to important information in your electronic medical record. We want to make it easier for you to view your health information - all in one secure location - when and where you need it. We expect MyChart will enhance the quality of care and service we provide.  When you register for MyChart, you can:    View your test results.    Request appointments and receive appointment reminders via email.    Request medication renewals.    View your medical history, allergies, medications and immunizations.    Communicate with your physician's office through a password-protected site.    Conveniently print information such as your medication lists.  To find out if MyChart is right for you, please talk to a member of our clinical staff today. We will gladly answer your questions about this free health and wellness tool.  If you are age 62 or older and want a member of your family to have access to your record, you must provide written consent by completing a proxy form available at our office. Please speak to our clinical staff about guidelines regarding accounts for patients younger than age 62.  As you activate your MyChart account and need any technical assistance, please call the MyChart technical support line at [phone]  [phone]-CHART [phone]) or email your question to [email or web address]. If you email your question(s), please include your name, a return phone number and the best time to reach you.  If you have non-urgent health-related questions, you can send a message to our office through MyChart at [email or web address]. If you have a medical emergency, call 911.  Thank you for using MyChart as your new health and wellness resource!   MyChart licensed from [employer],  [id number]. Patents Pending.

## 2013-01-27 ENCOUNTER — Emergency Department (HOSPITAL_COMMUNITY)
Admission: EM | Admit: 2013-01-27 | Discharge: 2013-01-27 | Disposition: A | Discharge status: Home / Self Care | Payer: Self-pay | Source: Home / Self Care

## 2013-02-03 ENCOUNTER — Ambulatory Visit (AMBULATORY_SURGERY_CENTER): Payer: Medicaid Other | Admitting: Gastroenterology

## 2013-02-03 ENCOUNTER — Encounter: Payer: Self-pay | Admitting: Gastroenterology

## 2013-02-03 VITALS — BP 117/62 | HR 62 | Temp 97.6°F | Resp 19 | Ht 64.0 in | Wt 146.0 lb

## 2013-02-03 DIAGNOSIS — R12 Heartburn: Secondary | ICD-10-CM

## 2013-02-03 DIAGNOSIS — R6881 Early satiety: Secondary | ICD-10-CM

## 2013-02-03 DIAGNOSIS — A048 Other specified bacterial intestinal infections: Secondary | ICD-10-CM

## 2013-02-03 DIAGNOSIS — K2970 Gastritis, unspecified, without bleeding: Secondary | ICD-10-CM

## 2013-02-03 DIAGNOSIS — K2990 Gastroduodenitis, unspecified, without bleeding: Secondary | ICD-10-CM

## 2013-02-03 MED ORDER — SODIUM CHLORIDE 0.9 % IV SOLN: 500.0000 mL | INTRAVENOUS | Status: DC

## 2013-02-03 NOTE — Progress Notes (Signed)
Patient did not experience any of the following events: a burn prior to discharge; a fall within the facility; wrong site/side/patient/procedure/implant event; or a hospital transfer or hospital admission upon discharge from the facility. (G8907) Patient did not have preoperative order for IV antibiotic SSI prophylaxis. (G8918)  

## 2013-02-03 NOTE — Patient Instructions (Addendum)
One of your biggest health concerns is your smoking.  This increases your risk for most cancers and serious cardiovascular diseases such as strokes, heart attacks.  You should try your best to stop.  If you need assistance, please contact your PCP or Smoking Cessation Class at [facility] [phone]) or [facility] Quit-Line (1-800-QUIT-NOW).  YOU HAD AN ENDOSCOPIC PROCEDURE TODAY AT THE [facility]: Refer to the procedure report that was given to you for any specific questions about what was found during the examination.  If the procedure report does not answer your questions, please call your gastroenterologist to clarify.  If you requested that your care partner not be given the details of your procedure findings, then the procedure report has been included in a sealed envelope for you to review at your convenience later.  YOU SHOULD EXPECT: Some feelings of bloating in the abdomen. Passage of more gas than usual.  Walking can help get rid of the air that was put into your GI tract during the procedure and reduce the bloating. If you had a lower endoscopy (such as a colonoscopy or flexible sigmoidoscopy) you may notice spotting of blood in your stool or on the toilet paper. If you underwent a bowel prep for your procedure, then you may not have a normal bowel movement for a few days.  DIET: Your first meal following the procedure should be a light meal and then it is ok to progress to your normal diet.  A half-sandwich or bowl of soup is an example of a good first meal.  Heavy or fried foods are harder to digest and may make you feel nauseous or bloated.  Likewise meals heavy in dairy and vegetables can cause extra gas to form and this can also increase the bloating.  Drink plenty of fluids but you should avoid alcoholic beverages for 24 hours.  ACTIVITY: Your care partner should take you home directly after the procedure.  You should plan to take it easy, moving slowly for the rest of  the day.  You can resume normal activity the day after the procedure however you should NOT DRIVE or use heavy machinery for 24 hours (because of the sedation medicines used during the test).    SYMPTOMS TO REPORT IMMEDIATELY: A gastroenterologist can be reached at any hour.  During normal business hours, 8:30 AM to 5:00 PM Monday through Friday, call [phone].  After hours and on weekends, please call the GI answering service at [phone] who will take a message and have the physician on call contact you.   Following upper endoscopy (EGD)  Vomiting of blood or coffee ground material  New chest pain or pain under the shoulder blades  Painful or persistently difficult swallowing  New shortness of breath  Fever of 100F or higher  Black, tarry-looking stools  FOLLOW UP: If any biopsies were taken you will be contacted by phone or by letter within the next 1-3 weeks.  Call your gastroenterologist if you have not heard about the biopsies in 3 weeks.  Our staff will call the home number listed on your records the next business day following your procedure to check on you and address any questions or concerns that you may have at that time regarding the information given to you following your procedure. This is a courtesy call and so if there is no answer at the home number and we have not heard from you through the emergency physician on call, we will assume  that you have returned to your regular daily activities without incident.  SIGNATURES/CONFIDENTIALITY: You and/or your care partner have signed paperwork which will be entered into your electronic medical record.  These signatures attest to the fact that that the information above on your After Visit Summary has been reviewed and is understood.  Full responsibility of the confidentiality of this discharge information lies with you and/or your care-partner.  Gastritis-handout given  Continue protonix daily

## 2013-02-03 NOTE — Op Note (Signed)
[facility] [address] [place], [address]   ENDOSCOPY PROCEDURE REPORT  PATIENT: Katherine, [name]  MR#: [id number] BIRTHDATE: 12/02/1970 , 41  yrs. old GENDER: Female ENDOSCOPIST: Rachael Fee, MD REFERRED BY:  Fleet Contras, M.D. PROCEDURE DATE:  02/03/2013 PROCEDURE:  EGD w/ biopsy ASA CLASS:     Class II INDICATIONS:  GERD, early satiety, weight loss. MEDICATIONS: Fentanyl 50 mcg IV, Versed 5 mg IV, and These medications were titrated to patient response per physician's verbal order TOPICAL ANESTHETIC: Cetacaine Spray  DESCRIPTION OF PROCEDURE: After the risks benefits and alternatives of the procedure were thoroughly explained, informed consent was obtained.  The [facility] [id number] [id number] endoscope was introduced through the mouth and advanced to the second portion of the duodenum. Without limitations.  The instrument was slowly withdrawn as the mucosa was fully examined.     There was moderate pan-gastritis.  Biopsies were taken and sent to pathology.  The examination was otherwise normal.  Retroflexed views revealed no abnormalities.     The scope was then withdrawn from the patient and the procedure completed. COMPLICATIONS: There were no complications.  ENDOSCOPIC IMPRESSION: There was moderate pan-gastritis.  Biopsies were taken and sent to pathology. The examination was otherwise normal.  RECOMMENDATIONS: Continue protonix once daily.  IF biopsies show H.  pylori, you will be started on appropriate antibiotics.   eSigned:  Rachael Fee, MD 02/03/2013 3:34 PM

## 2013-02-04 ENCOUNTER — Telehealth: Payer: Self-pay | Admitting: *Deleted

## 2013-02-04 NOTE — Telephone Encounter (Signed)
  Follow up Call-  Call back number 02/03/2013  Post procedure Call Back phone  # [phone]  Permission to leave phone message Yes     Patient questions:  Do you have a fever, pain , or abdominal swelling? no Pain Score  0 *  Have you tolerated food without any problems? yes  Have you been able to return to your normal activities? yes  Do you have any questions about your discharge instructions: Diet   no Medications  no Follow up visit  no  Do you have questions or concerns about your Care? no  Actions: * If pain score is 4 or above: No action needed, pain <4.

## 2013-02-13 ENCOUNTER — Telehealth: Payer: Self-pay

## 2013-02-13 ENCOUNTER — Other Ambulatory Visit: Payer: Self-pay

## 2013-02-13 MED ORDER — CLARITHROMYCIN 500 MG PO TABS: 500.0000 mg | ORAL_TABLET | Freq: Two times a day (BID) | ORAL | Status: DC

## 2013-02-13 MED ORDER — AMOXICILLIN 500 MG PO TABS: 1000.0000 mg | ORAL_TABLET | Freq: Two times a day (BID) | ORAL | Status: DC

## 2013-02-13 NOTE — Telephone Encounter (Signed)
Pt is allergic to PCN what do you want to send in place of Amox?

## 2013-02-14 MED ORDER — BIS SUBCIT-METRONID-TETRACYC 140-125-125 MG PO CAPS: 3.0000 | ORAL_CAPSULE | Freq: Three times a day (TID) | ORAL | Status: DC

## 2013-02-14 NOTE — Telephone Encounter (Signed)
Please call in a course of pylera instead.  thanks

## 2013-02-14 NOTE — Telephone Encounter (Signed)
pylera has been sent

## 2013-03-31 ENCOUNTER — Ambulatory Visit: Payer: Medicaid Other | Attending: Family Medicine | Admitting: Internal Medicine

## 2013-03-31 VITALS — BP 120/83 | HR 60 | Temp 97.9°F | Resp 16 | Wt 147.6 lb

## 2013-03-31 DIAGNOSIS — I471 Supraventricular tachycardia, unspecified: Secondary | ICD-10-CM | POA: Insufficient documentation

## 2013-03-31 DIAGNOSIS — J45909 Unspecified asthma, uncomplicated: Secondary | ICD-10-CM | POA: Insufficient documentation

## 2013-03-31 DIAGNOSIS — F411 Generalized anxiety disorder: Secondary | ICD-10-CM | POA: Insufficient documentation

## 2013-03-31 DIAGNOSIS — G43909 Migraine, unspecified, not intractable, without status migrainosus: Secondary | ICD-10-CM | POA: Insufficient documentation

## 2013-03-31 DIAGNOSIS — K219 Gastro-esophageal reflux disease without esophagitis: Secondary | ICD-10-CM | POA: Insufficient documentation

## 2013-03-31 MED ORDER — PANTOPRAZOLE SODIUM 40 MG PO TBEC: 40.0000 mg | DELAYED_RELEASE_TABLET | Freq: Every day | ORAL | Status: DC

## 2013-03-31 MED ORDER — BUSPIRONE HCL 15 MG PO TABS: 7.5000 mg | ORAL_TABLET | Freq: Two times a day (BID) | ORAL | Status: DC

## 2013-03-31 MED ORDER — OLOPATADINE HCL 0.1 % OP SOLN: 1.0000 [drp] | Freq: Two times a day (BID) | OPHTHALMIC | Status: DC

## 2013-03-31 MED ORDER — CETIRIZINE HCL 10 MG PO TABS: 10.0000 mg | ORAL_TABLET | Freq: Every day | ORAL | Status: DC

## 2013-03-31 MED ORDER — METOPROLOL TARTRATE 25 MG PO TABS: 25.0000 mg | ORAL_TABLET | Freq: Two times a day (BID) | ORAL | Status: DC

## 2013-03-31 MED ORDER — ALBUTEROL SULFATE HFA 108 (90 BASE) MCG/ACT IN AERS: 2.0000 | INHALATION_SPRAY | Freq: Four times a day (QID) | RESPIRATORY_TRACT | Status: DC | PRN

## 2013-03-31 MED ORDER — TOPIRAMATE 25 MG PO TABS: 25.0000 mg | ORAL_TABLET | Freq: Two times a day (BID) | ORAL | Status: DC

## 2013-03-31 NOTE — Progress Notes (Signed)
Patient ID: Katherine [name], female   DOB: 06/15/1971, 42 y.o.   MRN: [id number]  CC:  HPI: This is a 42 year old female with a past medical history of paroxysmal atrial tachycardia, migraine headaches, asthma, gastroesophageal reflux disease, severe anxiety and nicotine abuse. Comes in to establish care. Also complaining of itching, watering and swelling of her right eye which she noticed this morning. He has not been taking her home medications because she was confused about her medications and is here to clarify them. Issues been cutting back on smoking and is down to 2-3 cigarettes a day Allergies  Allergen Reactions  . Morphine Anaphylaxis, Hives and Swelling  . Shellfish Allergy Anaphylaxis  . Codeine Hives  . Hydrocodone Hives  . Penicillins Other (See Comments)    Yeast infection   Past Medical History  Diagnosis Date  . Pneumonia     with cavitation of left lower lobe  . Asthma   . Migraine   . Hot flashes   . Palpitations   . Chest pressure   . Anxiety   . Fibromyalgia    Current Outpatient Prescriptions on File Prior to Visit  Medication Sig Dispense Refill  . albuterol (VENTOLIN HFA) 108 (90 BASE) MCG/ACT inhaler Inhale 2 puffs into the lungs every 6 (six) hours as needed. For shortness of breath      . amoxicillin (AMOXIL) 500 MG tablet Take 2 tablets (1,000 mg total) by mouth 2 (two) times daily.  56 tablet  0  . bismuth-metronidazole-tetracycline (PYLERA) 140-125-125 MG per capsule Take 3 capsules by mouth 4 (four) times daily -  before meals and at bedtime.  120 capsule  0  . busPIRone (BUSPAR) 15 MG tablet Take 0.5 tablets (7.5 mg total) by mouth 2 (two) times daily.  30 tablet  0  . clarithromycin (BIAXIN) 500 MG tablet Take 1 tablet (500 mg total) by mouth 2 (two) times daily.  28 tablet  0  . fluticasone (FLONASE) 50 MCG/ACT nasal spray Place 2 sprays into the nose daily.        . hydrocortisone cream 1 % Apply 1 application topically as needed.      .  loratadine (CLARITIN) 10 MG tablet Take 1 tablet (10 mg total) by mouth daily.  30 tablet  2  . metoprolol tartrate (LOPRESSOR) 25 MG tablet Take 25 mg by mouth 2 (two) times daily.      . pantoprazole (PROTONIX) 40 MG tablet Take 1 tablet (40 mg total) by mouth daily before breakfast.  30 tablet  11  . potassium chloride SA (K-DUR,KLOR-CON) 20 MEQ tablet Take 20 mEq by mouth daily.      . pregabalin (LYRICA) 75 MG capsule Take 75 mg by mouth 2 (two) times daily.      . SUMAtriptan (IMITREX) 50 MG tablet Take 50 mg by mouth daily.      Marland Kitchen venlafaxine (EFFEXOR-XR) 150 MG 24 hr capsule Take 150 mg by mouth daily.       . [DISCONTINUED] diltiazem (CARDIZEM SR) 120 MG 12 hr capsule Take 1 capsule (120 mg total) by mouth daily.  30 capsule  6   No current facility-administered medications on file prior to visit.   Family History  Problem Relation Age of Onset  . Migraines Daughter   . Migraines Son   . Hypertension    . Hypertension Mother   . Hypertension Father   . Colon cancer Neg Hx   . Colon polyps Neg Hx   . Diabetes  Neg Hx   . Heart disease Mother   . Heart disease Father   . Kidney disease Neg Hx   . Liver disease Neg Hx    History   Social History  . Marital Status: Single    Spouse Name: N/A    Number of Children: N/A  . Years of Education: N/A   Occupational History  . Not on file.   Social History Main Topics  . Smoking status: Current Some Day Smoker -- 0.05 packs/day    Types: Cigarettes  . Smokeless tobacco: Never Used     Comment: form given 01-20-13  . Alcohol Use: No  . Drug Use: No     Comment: pt states she has d/c marijuana  . Sexually Active: Not on file   Other Topics Concern  . Not on file   Social History Narrative  . No narrative on file    Review of Systems ______ Constitutional: Negative for fever, chills, diaphoresis, activity change, appetite change and fatigue. Migranes.  HENT: , ringing in ears and drainage, nosebleeds, congestion,  facial swelling, rhinorrhea, neck pain, neck stiffness and ear discharge.  ____ Eyes: right eye swelling and itching today Respiratory: Negative for cough, choking, chest tightness, shortness of breath, Positive for wheezing- Neg stridor.  ____ Cardiovascular: Negative for chest pain, occasional palpitations and leg swelling. ____ Gastrointestinal: Negative for Nausea/ Vomiting/ Diarrhea or Consitpation Genitourinary: Negative for dysuria, urgency, frequency, hematuria, flank pain, decreased urine volume, difficulty urinating and dyspareunia. ____ Musculoskeletal:Positive for back pain, joint swelling, arthralgias and gait problem. ________ Neurological: Negative for dizziness, tremors, seizures, syncope, facial asymmetry, speech difficulty, weakness, light-headedness, numbness and headaches. ____ Hematological: Negative for adenopathy. Does not bruise/bleed easily. ____ Psychiatric/Behavioral: Positive for anxiety and panic attacks  Negative for hallucinations, behavioral problems, confusion,  decreased concentration and agitation. ______   Objective:   Filed Vitals:   03/31/13 1205  BP: 120/83  Pulse: 60  Temp: 97.9 F (36.6 C)  Resp: 16    Physical Exam ______ Constitutional: Appears well-developed and well-nourished. No distress. ____ HENT: Normocephalic. External right and left ear normal. Oropharynx is clear and moist. ____ Eyes: Conjunctivae and EOM are normal. PERRLA, no scleral icterus. Mild swelling of lower eyelid on right eye with clear/ watery discharge.  Neck: Normal ROM. Neck supple. No JVD. No tracheal deviation. No thyromegaly. ____ CVS: RRR, S1/S2 +, no murmurs, no gallops, no carotid bruit.  Pulmonary: Effort and breath sounds normal, no stridor, rhonchi, wheezes, rales.  Abdominal: Soft. BS +,  no distension, tenderness, rebound or guarding. ________ Musculoskeletal: Normal range of motion. No edema and no tenderness. ____ Lymphadenopathy: No lymphadenopathy  noted, cervical, inguinal. Neuro: Alert. Normal reflexes, muscle tone coordination. No cranial nerve deficit. Skin: Skin is warm and dry. No rash noted. Not diaphoretic. No erythema. No pallor. ____ Psychiatric: Normal mood and affect. Behavior, judgment, thought content normal. __  Lab Results  Component Value Date   WBC 5.9 12/29/2012   HGB 11.5* 12/29/2012   HCT 34.4* 12/29/2012   MCV 79.1 12/29/2012   PLT 186 12/29/2012   Lab Results  Component Value Date   CREATININE 0.58 12/29/2012   BUN 8 12/29/2012   NA 139 12/29/2012   K 4.0 12/29/2012   CL 107 12/29/2012   CO2 24 12/29/2012    No results found for this basename: HGBA1C   Lipid Panel     Component Value Date/Time   CHOL 142 07/21/2010 1634   TRIG 106.0 07/21/2010 1634  HDL 48.90 07/21/2010 1634   CHOLHDL 3 07/21/2010 1634   VLDL 21.2 07/21/2010 1634   LDLCALC 72 07/21/2010 1634       Assessment and plan:   Patient Active Problem List   Diagnosis Date Noted  . Headache 12/20/2012  . Syncope 06/20/2012  . Leiomyoma of uterus, unspecified 03/26/2012  . Family history of breast cancer 01/22/2012  . MYALGIA 12/09/2010  . ALLERGIC RHINITIS 07/18/2010  . HAIR LOSS 07/04/2010  . PRURITUS 12/15/2009  . ANXIETY DISORDER, SITUATIONAL, MILD 11/17/2009  . PALPITATIONS 10/12/2009  . CHEST PAIN, LEFT 10/12/2009  . DYSURIA 08/26/2009  . PNEUMONIA, LEFT LOWER LOBE 07/21/2009  . FOLLICULITIS 05/10/2009  . MAMMOGRAM, ABNORMAL, RIGHT, HX OF 04/09/2009  . TOBACCO ABUSE 03/17/2009  . DECREASED HEARING, RIGHT EAR 10/27/2008  . MUSCLE STRAIN, ABDOMINAL WALL 06/11/2008  . BACK PAIN 01/27/2008  . MIGRAINE HEADACHE 07/31/2007  . BACK PAIN, LUMBAR 06/06/2007  . GERD 07/18/2006  . HOT FLASHES 07/18/2006  . 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 Paroxysmal atrial tachycardia-resume metoprolol #2. Allergies-right eye-continue Zyrtec and add Patanol eyedrops #3. Migraine headaches-was previously taking Topamax-I have resumed this at a dose of 25 mg  daily for now #4. Gastroesophageal reflux disease-have resumed her protonix #5. Asthma-have reordered an inhaler and advised that she stop smoking completely #6. Anxiety-continue BuSpar-she stopped it about a week ago because as mentioned above she was confused about her medications    Followup in 2 months-she will need a Pap smear and a mammogram

## 2013-03-31 NOTE — Progress Notes (Signed)
Patient states woke up this am with swelling under right eye and right cheek Also complains of acid reflux

## 2013-04-08 ENCOUNTER — Encounter: Payer: Self-pay | Admitting: Gastroenterology

## 2013-04-08 ENCOUNTER — Ambulatory Visit (INDEPENDENT_AMBULATORY_CARE_PROVIDER_SITE_OTHER): Payer: Medicaid Other | Admitting: Gastroenterology

## 2013-04-08 VITALS — BP 120/60 | HR 64 | Ht 64.0 in | Wt 150.4 lb

## 2013-04-08 DIAGNOSIS — A048 Other specified bacterial intestinal infections: Secondary | ICD-10-CM

## 2013-04-08 DIAGNOSIS — K219 Gastro-esophageal reflux disease without esophagitis: Secondary | ICD-10-CM

## 2013-04-08 DIAGNOSIS — B9681 Helicobacter pylori [H. pylori] as the cause of diseases classified elsewhere: Secondary | ICD-10-CM

## 2013-04-08 NOTE — Progress Notes (Signed)
Review of pertinent gastrointestinal problems: 1. H .pylori gastritis; pressented with early satiety, weight loss: [place] 12/2012 was normal. Cbc, cmet both essentially normal except slightly low HB (11.5) normocytic.  EGD 01/2013 showed moderate pan gastritis, biopsies were positive for H. Pylori, was put on abx  HPI: This is a  very pleasant 42 year old woman who is here with her husband today. I last saw her for  EGD 2 months ago.  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  Her symptoms are "so-so".  She completed the course of antibiotics, her stomach is still sore."  She has rash after eating tomatoes.  Anything with pizza causes red bumps on her face to appear.  Unclear why.  She has started to regain weight.    Certain foods can still bother her with pyrosis.  She takes protonix 40mg  10-60 min before breakfast.  She drinks very rare caffeine.  She smokes one cig per day.  No etoh.  Rare peppermint, rare chocolate.     Past Medical History  Diagnosis Date  . Pneumonia     with cavitation of left lower lobe  . Asthma   . Migraine   . Hot flashes   . Palpitations   . Chest pressure   . Anxiety   . Fibromyalgia     Past Surgical History  Procedure Laterality Date  . Tubal ligation  1993    Current Outpatient Prescriptions  Medication Sig Dispense Refill  . albuterol (VENTOLIN HFA) 108 (90 BASE) MCG/ACT inhaler Inhale 2 puffs into the lungs every 6 (six) hours as needed. For shortness of breath  1 each  3  . busPIRone (BUSPAR) 15 MG tablet Take 0.5 tablets (7.5 mg total) by mouth 2 (two) times daily.  30 tablet  0  . cetirizine (ZYRTEC) 10 MG tablet Take 1 tablet (10 mg total) by mouth daily.  30 tablet  3  . fluticasone (FLONASE) 50 MCG/ACT nasal spray Place 2 sprays into the nose daily.        . metoprolol tartrate (LOPRESSOR) 25 MG tablet Take 1 tablet (25 mg total) by mouth 2 (two) times daily.  60 tablet  0  .  olopatadine (PATANOL) 0.1 % ophthalmic solution Place 1 drop into the right eye 2 (two) times daily.  5 mL  12  . pantoprazole (PROTONIX) 40 MG tablet Take 1 tablet (40 mg total) by mouth daily before breakfast.  30 tablet  11  . topiramate (TOPAMAX) 25 MG tablet Take 1 tablet (25 mg total) by mouth 2 (two) times daily.  30 tablet  0  . [DISCONTINUED] diltiazem (CARDIZEM SR) 120 MG 12 hr capsule Take 1 capsule (120 mg total) by mouth daily.  30 capsule  6   No current facility-administered medications for this visit.    Allergies as of 04/08/2013 - Review Complete 04/08/2013  Allergen Reaction Noted  . Morphine Anaphylaxis, Hives, and Swelling 01/27/2008  . Shellfish allergy Anaphylaxis 06/19/2012  . Codeine Hives 01/22/2012  . Hydrocodone Hives 05/03/2011  . Penicillins Other (See Comments) 06/03/2012    Family History  Problem Relation Age of Onset  . Migraines Daughter   . Migraines Son   . Hypertension    . Hypertension Mother   . Hypertension Father   . Colon cancer Neg Hx   . Colon polyps Neg Hx   . Diabetes Neg Hx   . Heart disease Mother   . Heart disease Father   .  Kidney disease Neg Hx   . Liver disease Neg Hx     History   Social History  . Marital Status: Single    Spouse Name: N/A    Number of Children: N/A  . Years of Education: N/A   Occupational History  . Not on file.   Social History Main Topics  . Smoking status: Current Some Day Smoker -- 0.05 packs/day    Types: Cigarettes  . Smokeless tobacco: Never Used     Comment: form given 01-20-13  . Alcohol Use: No  . Drug Use: No     Comment: pt states she has d/c marijuana  . Sexually Active: Not on file   Other Topics Concern  . Not on file   Social History Narrative  . No narrative on file      Physical Exam: BP 120/60  Pulse 64  Ht 5\' 4"  (1.626 m)  Wt 150 lb 6.4 oz (68.221 kg)  BMI 25.8 kg/m2  LMP 03/17/2013 Constitutional: generally well-appearing Psychiatric: alert and oriented  x3 Abdomen: soft, nontender, nondistended, no obvious ascites, no peritoneal signs, normal bowel sounds     Assessment and plan: 42 y.o. female with recent H. pylori, chronic GERD  She is going to continue her proton pump inhibitor once daily shortly before breakfast meal. She will H2 blocker at night at bedtime. She can return to see me on an as-needed basis.

## 2013-04-08 NOTE — Patient Instructions (Addendum)
You have been given a separate informational sheet regarding your tobacco use, the importance of quitting and local resources to help you quit. Stay on protonix one pill once daily (before BF meal). Start pepcid or zantac, one pill at bedtime every night.

## 2013-04-11 ENCOUNTER — Emergency Department (INDEPENDENT_AMBULATORY_CARE_PROVIDER_SITE_OTHER): Payer: Medicaid Other

## 2013-04-11 ENCOUNTER — Emergency Department (HOSPITAL_COMMUNITY)
Admission: EM | Admit: 2013-04-11 | Discharge: 2013-04-11 | Disposition: A | Discharge status: Home / Self Care | Payer: Medicaid Other | Source: Home / Self Care

## 2013-04-11 ENCOUNTER — Encounter (HOSPITAL_COMMUNITY): Payer: Self-pay

## 2013-04-11 DIAGNOSIS — M79609 Pain in unspecified limb: Secondary | ICD-10-CM

## 2013-04-11 DIAGNOSIS — R2231 Localized swelling, mass and lump, right upper limb: Secondary | ICD-10-CM

## 2013-04-11 DIAGNOSIS — M79641 Pain in right hand: Secondary | ICD-10-CM

## 2013-04-11 DIAGNOSIS — R229 Localized swelling, mass and lump, unspecified: Secondary | ICD-10-CM

## 2013-04-11 MED ORDER — NAPROXEN 500 MG PO TABS: 500.0000 mg | ORAL_TABLET | Freq: Two times a day (BID) | ORAL | Status: DC

## 2013-04-11 NOTE — ED Provider Notes (Signed)
History    CSN: [id number] Arrival date & time 04/11/13  1711  First MD Initiated Contact with Patient 04/11/13 1752     Chief Complaint  Patient presents with  . Hand Pain   (Consider location/radiation/quality/duration/timing/severity/associated sxs/prior Treatment) HPI Comments: 42 year old female presents complaining of painful and not on the palm of her right hand at the base of the ring finger for the last couple of months. She thinks it feels like a knot on the bone. Originally it did not bother her but it has become painful so she decided to come in to be seen. It is painful when she presses on it or she tries to grab something or make a fist. She does not have any similar knot elsewhere. She denies any injury to the finger. No fever, chills, or numbness in the fingers. No redness or swelling around this hard knot.  Patient is a 43 y.o. female presenting with hand pain.  Hand Pain Pertinent negatives include no chest pain, no abdominal pain and no shortness of breath.   Past Medical History  Diagnosis Date  . Pneumonia     with cavitation of left lower lobe  . Asthma   . Migraine   . Hot flashes   . Palpitations   . Chest pressure   . Anxiety   . Fibromyalgia    Past Surgical History  Procedure Laterality Date  . Tubal ligation  1993   Family History  Problem Relation Age of Onset  . Migraines Daughter   . Migraines Son   . Hypertension    . Hypertension Mother   . Hypertension Father   . Colon cancer Neg Hx   . Colon polyps Neg Hx   . Diabetes Neg Hx   . Heart disease Mother   . Heart disease Father   . Kidney disease Neg Hx   . Liver disease Neg Hx    History  Substance Use Topics  . Smoking status: Current Some Day Smoker -- 0.05 packs/day    Types: Cigarettes  . Smokeless tobacco: Never Used     Comment: form given 01-20-13  . Alcohol Use: No   OB History   Grav Para Term Preterm Abortions TAB SAB Ect Mult Living   2 2 1 1      2      Review  of Systems  Constitutional: Negative for fever and chills.  Eyes: Negative for visual disturbance.  Respiratory: Negative for cough and shortness of breath.   Cardiovascular: Negative for chest pain, palpitations and leg swelling.  Gastrointestinal: Negative for nausea, vomiting and abdominal pain.  Endocrine: Negative for polydipsia and polyuria.  Genitourinary: Negative for dysuria, urgency and frequency.  Musculoskeletal: Negative for myalgias and arthralgias.       See history of present illness  Skin: Negative for rash.  Neurological: Negative for dizziness, weakness and light-headedness.    Allergies  Morphine; Shellfish allergy; Codeine; Hydrocodone; and Penicillins  Home Medications   Current Outpatient Rx  Name  Route  Sig  Dispense  Refill  . albuterol (VENTOLIN HFA) 108 (90 BASE) MCG/ACT inhaler   Inhalation   Inhale 2 puffs into the lungs every 6 (six) hours as needed. For shortness of breath   1 each   3   . busPIRone (BUSPAR) 15 MG tablet   Oral   Take 0.5 tablets (7.5 mg total) by mouth 2 (two) times daily.   30 tablet   0   . cetirizine (ZYRTEC) 10 MG  tablet   Oral   Take 1 tablet (10 mg total) by mouth daily.   30 tablet   3   . fluticasone (FLONASE) 50 MCG/ACT nasal spray   Nasal   Place 2 sprays into the nose daily.           . metoprolol tartrate (LOPRESSOR) 25 MG tablet   Oral   Take 1 tablet (25 mg total) by mouth 2 (two) times daily.   60 tablet   0   . naproxen (NAPROSYN) 500 MG tablet   Oral   Take 1 tablet (500 mg total) by mouth 2 (two) times daily.   60 tablet   0   . olopatadine (PATANOL) 0.1 % ophthalmic solution   Right Eye   Place 1 drop into the right eye 2 (two) times daily.   5 mL   12   . pantoprazole (PROTONIX) 40 MG tablet   Oral   Take 1 tablet (40 mg total) by mouth daily before breakfast.   30 tablet   11   . topiramate (TOPAMAX) 25 MG tablet   Oral   Take 1 tablet (25 mg total) by mouth 2 (two) times  daily.   30 tablet   0    BP 114/80  Pulse 62  Temp(Src) 98.9 F (37.2 C) (Oral)  Resp 18  SpO2 100%  LMP 03/17/2013 Physical Exam  Nursing note and vitals reviewed. Constitutional: She is oriented to person, place, and time. Vital signs are normal. She appears well-developed and well-nourished. No distress.  HENT:  Head: Atraumatic.  Eyes: EOM are normal. Pupils are equal, round, and reactive to light.  Musculoskeletal:       Right hand: She exhibits tenderness, bony tenderness (base of the ring finger) and deformity (palpable hard mass on the proximal phalange it, fourth digit, palmar side). She exhibits normal two-point discrimination, normal capillary refill and no swelling. Decreased range of motion: She is able to perform all range of motion with the affected finger, but with pain over the area described. Normal sensation noted. Normal strength noted.  Neurological: She is alert and oriented to person, place, and time. She has normal strength.  Skin: Skin is warm and dry. She is not diaphoretic.  Psychiatric: She has a normal mood and affect. Her behavior is normal. Judgment normal.    ED Course  Procedures (including critical care time) Labs Reviewed - No data to display Dg Hand Complete Right  04/11/2013   *RADIOLOGY REPORT*  Clinical Data: Right hand pain at the level of the fourth metacarpal phalangeal joint.  No trauma.  RIGHT HAND - COMPLETE 3+ VIEW  Comparison: None.  Findings: No fracture or dislocation.  No soft tissue abnormality. No radiopaque foreign body.  IMPRESSION: No acute osseous abnormality of the right hand.   Original Report Authenticated By: Christiana Pellant, M.D.   1. Hand pain, right   2. Finger mass, right     MDM  The x-ray was read as normal although there is what appears to be a sesamoid seen on the lateral film corresponds with this patient's physical exam findings. I will treat her with anti-inflammatories, ice, have her followup with hand  surgery.  Meds ordered this encounter  Medications  . naproxen (NAPROSYN) 500 MG tablet    Sig: Take 1 tablet (500 mg total) by mouth 2 (two) times daily.    Dispense:  60 tablet    Refill:  0     Graylon Good, PA-C  04/11/13 1957 

## 2013-04-11 NOTE — ED Notes (Addendum)
Reports pain in her right hand, palm aspect , at distal aspect of 4th MC ; denies injury, lump palpated at area of pain ; patient right handed, c/o unable to grip for extended periods due to her pain

## 2013-04-13 NOTE — ED Provider Notes (Signed)
Medical screening examination/treatment/procedure(s) were performed by a resident physician or non-physician practitioner and as the supervising physician I was immediately available for consultation/collaboration.  Clementeen Graham, MD   Rodolph Bong, MD 04/13/13 [phone]

## 2013-04-16 ENCOUNTER — Ambulatory Visit: Payer: Medicaid Other | Attending: Family Medicine | Admitting: Internal Medicine

## 2013-04-16 ENCOUNTER — Encounter: Payer: Self-pay | Admitting: Internal Medicine

## 2013-04-16 VITALS — BP 106/71 | HR 56 | Temp 98.6°F | Resp 14 | Ht 66.0 in | Wt 148.2 lb

## 2013-04-16 DIAGNOSIS — M79641 Pain in right hand: Secondary | ICD-10-CM

## 2013-04-16 DIAGNOSIS — M79609 Pain in unspecified limb: Secondary | ICD-10-CM

## 2013-04-16 DIAGNOSIS — R229 Localized swelling, mass and lump, unspecified: Secondary | ICD-10-CM | POA: Insufficient documentation

## 2013-04-16 NOTE — Progress Notes (Signed)
Patient ID: Katherine [name], female   DOB: 1971-08-10, 42 y.o.   MRN: [id number]   HPI: This is a 42 y/o female who comes in for a bony mass in her right hand on the volar surface which is causing significant pain. She noticed it a few weeks ago. It is tender to the touch. She went to urgent care to be evaluated and had and xray performed which did not reveal any abnormality. Notes from urgent care state that she was to be referred to hand surgery but she states that she never received the referral. She is essentially here for a referral.  Otherwise, she states she is doing well. I evaluated her on 7/7 and adjusted many of her medications which she was confused about.  Allergies  Allergen Reactions  . Morphine Anaphylaxis, Hives and Swelling  . Shellfish Allergy Anaphylaxis  . Codeine Hives  . Hydrocodone Hives  . Penicillins Other (See Comments)    Yeast infection   Past Medical History  Diagnosis Date  . Pneumonia     with cavitation of left lower lobe  . Asthma   . Migraine   . Hot flashes   . Palpitations   . Chest pressure   . Anxiety   . Fibromyalgia    Prior to Admission medications   Medication Sig Start Date End Date Taking? Authorizing Provider  busPIRone (BUSPAR) 15 MG tablet Take 0.5 tablets (7.5 mg total) by mouth 2 (two) times daily. 03/31/13 03/31/14 Yes Calvert Cantor, MD  cetirizine (ZYRTEC) 10 MG tablet Take 1 tablet (10 mg total) by mouth daily. 03/31/13  Yes Calvert Cantor, MD  fluticasone (FLONASE) 50 MCG/ACT nasal spray Place 2 sprays into the nose daily.     Yes Historical Provider, MD  metoprolol tartrate (LOPRESSOR) 25 MG tablet Take 1 tablet (25 mg total) by mouth 2 (two) times daily. 03/31/13  Yes Calvert Cantor, MD  olopatadine (PATANOL) 0.1 % ophthalmic solution Place 1 drop into the right eye 2 (two) times daily. 03/31/13  Yes Calvert Cantor, MD  pantoprazole (PROTONIX) 40 MG tablet Take 1 tablet (40 mg total) by mouth daily before breakfast. 03/31/13  Yes Calvert Cantor, MD   topiramate (TOPAMAX) 25 MG tablet Take 1 tablet (25 mg total) by mouth 2 (two) times daily. 03/31/13  Yes Calvert Cantor, MD  albuterol (VENTOLIN HFA) 108 (90 BASE) MCG/ACT inhaler Inhale 2 puffs into the lungs every 6 (six) hours as needed. For shortness of breath 03/31/13   Calvert Cantor, MD   Family History  Problem Relation Age of Onset  . Migraines Daughter   . Migraines Son   . Hypertension    . Hypertension Mother   . Hypertension Father   . Colon cancer Neg Hx   . Colon polyps Neg Hx   . Diabetes Neg Hx   . Heart disease Mother   . Heart disease Father   . Kidney disease Neg Hx   . Liver disease Neg Hx    History   Social History  . Marital Status: Single    Spouse Name: N/A    Number of Children: N/A  . Years of Education: N/A   Occupational History  . Not on file.   Social History Main Topics  . Smoking status: Current Some Day Smoker -- 0.05 packs/day    Types: Cigarettes  . Smokeless tobacco: Never Used     Comment: form given 01-20-13  . Alcohol Use: No  . Drug Use: No  Comment: pt states she has d/c marijuana  . Sexually Active: Not on file   Other Topics Concern  . Not on file   Social History Narrative  . No narrative on file     Objective:   Filed Vitals:   04/16/13 0941  BP: 106/71  Pulse: 56  Temp: 98.6 F (37 C)  Resp: 14    Physical Exam ______ Constitutional: Appears well-developed and well-nourished. No distress. ____ HENT: Normocephalic. External right and left ear normal. Oropharynx is clear and moist. ____ Eyes: Conjunctivae and EOM are normal. PERRLA, no scleral icterus. ____ Neck: Normal ROM. Neck supple. No JVD. No tracheal deviation. No thyromegaly. ____ CVS: RRR, S1/S2 +, no murmurs, no gallops, no carotid bruit.  Pulmonary: Effort and breath sounds normal, no stridor, rhonchi, wheezes, rales.  Abdominal: Soft. BS +,  no distension, tenderness, rebound or guarding. ________ Musculoskeletal: Normal range of motion. No edema  and no tenderness. ____ Extremities: right hand, palmar surface, near the base of the middle finger is a 1-2 cm nodule- firm, tender, non-mobile.  Lymphadenopathy: No lymphadenopathy noted, cervical, inguinal. Neuro: Alert. Normal reflexes, muscle tone coordination. No cranial nerve deficit. Skin: Skin is warm and dry. No rash noted. Not diaphoretic. No erythema. No pallor. ____ Psychiatric: Normal mood and affect. Behavior, judgment, thought content normal. __  Lab Results  Component Value Date   WBC 5.9 12/29/2012   HGB 11.5* 12/29/2012   HCT 34.4* 12/29/2012   MCV 79.1 12/29/2012   PLT 186 12/29/2012   Lab Results  Component Value Date   CREATININE 0.58 12/29/2012   BUN 8 12/29/2012   NA 139 12/29/2012   K 4.0 12/29/2012   CL 107 12/29/2012   CO2 24 12/29/2012    No results found for this basename: HGBA1C   Lipid Panel     Component Value Date/Time   CHOL 142 07/21/2010 1634   TRIG 106.0 07/21/2010 1634   HDL 48.90 07/21/2010 1634   CHOLHDL 3 07/21/2010 1634   VLDL 21.2 07/21/2010 1634   LDLCALC 72 07/21/2010 1634       Assessment and plan:   Patient Active Problem List   Diagnosis Date Noted  . Atrial tachycardia, paroxysmal 03/31/2013  . Headache 12/20/2012  . Syncope 06/20/2012  . Leiomyoma of uterus, unspecified 03/26/2012  . Family history of breast cancer 01/22/2012  . MYALGIA 12/09/2010  . ALLERGIC RHINITIS 07/18/2010  . HAIR LOSS 07/04/2010  . PRURITUS 12/15/2009  . ANXIETY DISORDER, SITUATIONAL, MILD 11/17/2009  . PALPITATIONS 10/12/2009  . CHEST PAIN, LEFT 10/12/2009  . DYSURIA 08/26/2009  . PNEUMONIA, LEFT LOWER LOBE 07/21/2009  . FOLLICULITIS 05/10/2009  . MAMMOGRAM, ABNORMAL, RIGHT, HX OF 04/09/2009  . TOBACCO ABUSE 03/17/2009  . DECREASED HEARING, RIGHT EAR 10/27/2008  . MUSCLE STRAIN, ABDOMINAL WALL 06/11/2008  . BACK PAIN 01/27/2008  . MIGRAINE HEADACHE 07/31/2007  . BACK PAIN, LUMBAR 06/06/2007  . GERD 07/18/2006  . HOT FLASHES 07/18/2006  . 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 Nodule - right hand- referral to hand surgery made.

## 2013-04-16 NOTE — Progress Notes (Signed)
Pt here for second opinion of R hand bone floating 4th digit palm area. States she was seen at Urgent Care and told it was "floating bone" post xray. C/o sharp intermit pai only with picking things up. No numbness/tingling noted

## 2013-05-12 ENCOUNTER — Telehealth: Payer: Self-pay | Admitting: Family Medicine

## 2013-05-12 NOTE — Telephone Encounter (Signed)
[facility] center called 8/18 wanting a NPI # for a visit. This pt is not [facility] and never has been they were referred to the [facility] by Dr. Butler Denmark of [facility]. Britta Mccreedy gave a one time use today JW

## 2013-06-02 ENCOUNTER — Encounter: Payer: Self-pay | Admitting: Internal Medicine

## 2013-06-02 ENCOUNTER — Ambulatory Visit: Payer: Medicaid Other | Attending: Internal Medicine | Admitting: Internal Medicine

## 2013-06-02 VITALS — BP 130/85 | HR 66 | Temp 98.2°F | Resp 16 | Wt 152.4 lb

## 2013-06-02 DIAGNOSIS — I10 Essential (primary) hypertension: Secondary | ICD-10-CM

## 2013-06-02 LAB — LIPID PANEL
Cholesterol: 138 mg/dL (ref 0–200)
HDL: 47 mg/dL (ref >39)
LDL Cholesterol: 72 mg/dL (ref 0–99)
Triglycerides: 93 mg/dL (ref <150)

## 2013-06-02 MED ORDER — CETIRIZINE HCL 10 MG PO TABS: 10.0000 mg | ORAL_TABLET | Freq: Every day | ORAL | Status: DC

## 2013-06-02 MED ORDER — FLUTICASONE PROPIONATE 50 MCG/ACT NA SUSP: 2.0000 | Freq: Every day | NASAL | Status: DC

## 2013-06-02 NOTE — Progress Notes (Unsigned)
Patient ID: Katherine [name], female   DOB: 1970-10-18, 42 y.o.   MRN: [id number]  Patient Demographics  Katherine [name], is a 42 y.o. female  WUJ:[id number]  [id number]  DOB - 07/21/1971  Chief Complaint  Patient presents with  . Follow-up        Subjective:   Katherine [name] with History of migraine headaches, paroxysmal atrial tachycardia, anxiety, here for routine followup visit, she was recently diagnosed with right fourth MCP joint dislocation and needs social work help to obtain proper it worked to be referred to Hydrographic surveyor,  is here for   Denies any subjective complaints except as above, no active headache, no chest abdominal pain at this time, not short of breath. No focal weakness which is new.    Objective:    Patient Active Problem List   Diagnosis Date Noted  . Atrial tachycardia, paroxysmal 03/31/2013  . Headache 12/20/2012  . Syncope 06/20/2012  . Leiomyoma of uterus, unspecified 03/26/2012  . Family history of breast cancer 01/22/2012  . MYALGIA 12/09/2010  . ALLERGIC RHINITIS 07/18/2010  . HAIR LOSS 07/04/2010  . PRURITUS 12/15/2009  . ANXIETY DISORDER, SITUATIONAL, MILD 11/17/2009  . PALPITATIONS 10/12/2009  . CHEST PAIN, LEFT 10/12/2009  . DYSURIA 08/26/2009  . PNEUMONIA, LEFT LOWER LOBE 07/21/2009  . FOLLICULITIS 05/10/2009  . MAMMOGRAM, ABNORMAL, RIGHT, HX OF 04/09/2009  . TOBACCO ABUSE 03/17/2009  . DECREASED HEARING, RIGHT EAR 10/27/2008  . MUSCLE STRAIN, ABDOMINAL WALL 06/11/2008  . BACK PAIN 01/27/2008  . MIGRAINE HEADACHE 07/31/2007  . BACK PAIN, LUMBAR 06/06/2007  . GERD 07/18/2006  . HOT FLASHES 07/18/2006  . ACNE VULGARIS 07/18/2006     Filed Vitals:   06/02/13 0938 06/02/13 1011 06/02/13 1012  BP: 99/66 125/85 130/85  Pulse: 66    Temp: 98.2 F (36.8 C)    Resp: 16    Weight: 152 lb 6.4 oz (69.128 kg)    SpO2: 100%       Exam   Awake Alert, Oriented X 3, No new F.N deficits, Normal affect [place].AT,PERRAL, bilateral ear  exam stable, in both ear canals are clean, no wax, light reflex normal bilaterally Supple Neck,No JVD, No cervical lymphadenopathy appriciated.  Symmetrical Chest wall movement, Good air movement bilaterally, CTAB RRR,No Gallops,Rubs or new Murmurs, No Parasternal Heave +ve B.Sounds, Abd Soft, Non tender, No organomegaly appriciated, No rebound - guarding or rigidity. No Cyanosis, Clubbing or edema, No new Rash or bruise     Data Review   CBC No results found for this basename: WBC, HGB, HCT, PLT, MCV, MCH, MCHC, RDW, NEUTRABS, LYMPHSABS, MONOABS, EOSABS, BASOSABS, BANDABS, BANDSABD,  in the last 168 hours  Chemistries   No results found for this basename: NA, K, CL, CO2, GLUCOSE, BUN, CREATININE, GFRCGP, CALCIUM, MG, AST, ALT, ALKPHOS, BILITOT,  in the last 168 hours ------------------------------------------------------------------------------------------------------------------ No results found for this basename: HGBA1C,  in the last 72 hours ------------------------------------------------------------------------------------------------------------------ No results found for this basename: CHOL, HDL, LDLCALC, TRIG, CHOLHDL, LDLDIRECT,  in the last 72 hours ------------------------------------------------------------------------------------------------------------------ No results found for this basename: TSH, T4TOTAL, FREET3, T3FREE, THYROIDAB,  in the last 72 hours ------------------------------------------------------------------------------------------------------------------ No results found for this basename: VITAMINB12, FOLATE, FERRITIN, TIBC, IRON, RETICCTPCT,  in the last 72 hours  Coagulation profile  No results found for this basename: INR, PROTIME,  in the last 168 hours     Prior to Admission medications   Medication Sig Start Date End Date Taking? Authorizing Provider  albuterol (VENTOLIN HFA) 108 (90 BASE)  MCG/ACT inhaler Inhale 2 puffs into the lungs every 6  (six) hours as needed. For shortness of breath 03/31/13   Calvert Cantor, MD  busPIRone (BUSPAR) 15 MG tablet Take 0.5 tablets (7.5 mg total) by mouth 2 (two) times daily. 03/31/13 03/31/14  Calvert Cantor, MD  cetirizine (ZYRTEC) 10 MG tablet Take 1 tablet (10 mg total) by mouth daily. 06/02/13   Leroy Sea, MD  fluticasone (FLONASE) 50 MCG/ACT nasal spray Place 2 sprays into the nose daily. 06/02/13   Leroy Sea, MD  metoprolol tartrate (LOPRESSOR) 25 MG tablet Take 1 tablet (25 mg total) by mouth 2 (two) times daily. 03/31/13   Calvert Cantor, MD  olopatadine (PATANOL) 0.1 % ophthalmic solution Place 1 drop into the right eye 2 (two) times daily. 03/31/13   Calvert Cantor, MD  pantoprazole (PROTONIX) 40 MG tablet Take 1 tablet (40 mg total) by mouth daily before breakfast. 03/31/13   Calvert Cantor, MD  topiramate (TOPAMAX) 25 MG tablet Take 1 tablet (25 mg total) by mouth 2 (two) times daily. 03/31/13   Calvert Cantor, MD     Assessment & Plan    Chronic ear fullness bilaterally with some ringing. Her exam is stable, this is likely eustachian tube blockage from seasonal allergies, Zyrtec and Flonase prescribed. Requested to gargle with lukewarm water for 5 times a day on a daily basis. Also advised to quit smoking.   H/O smoking. Advised to quit smoking    Hypertension. Blood pressure in fact was lower on her first check, subsequent checks were stable she was not orthostatic. Have stopped her Lopressor for now.    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 Social work and Hydrographic surveyor referral made. She has already seen a hand surgeon and was advised to get proper paperwork before they can operate.     Routine health maintenance.  Screening labs. CBC, BMP noted from recent lab work, have ordered baseline TSH, A1c, lipid panel     Mammogram, Pap smear - referral made    Immunizations flu shot       Leroy Sea M.D on 06/02/2013 at 10:16 AM

## 2013-06-02 NOTE — Progress Notes (Unsigned)
Follow up from bony mass to right hand Can not get procedure done until she straightens out things with medicaid Feels light headed and blood pressure is low

## 2013-06-03 ENCOUNTER — Telehealth: Payer: Self-pay | Admitting: *Deleted

## 2013-06-03 ENCOUNTER — Other Ambulatory Visit: Payer: Self-pay

## 2013-06-03 DIAGNOSIS — G43909 Migraine, unspecified, not intractable, without status migrainosus: Secondary | ICD-10-CM

## 2013-06-03 LAB — TSH: TSH: 0.873 u[IU]/mL (ref 0.350–4.500)

## 2013-06-03 MED ORDER — BUSPIRONE HCL 15 MG PO TABS: 7.5000 mg | ORAL_TABLET | Freq: Two times a day (BID) | ORAL | Status: DC

## 2013-06-03 MED ORDER — METOPROLOL TARTRATE 25 MG PO TABS: 25.0000 mg | ORAL_TABLET | Freq: Two times a day (BID) | ORAL | Status: DC

## 2013-06-03 NOTE — Telephone Encounter (Signed)
Refilled rx for 1 month. Will you like patient to come back for further refills.

## 2013-06-16 ENCOUNTER — Encounter: Payer: Self-pay | Admitting: Internal Medicine

## 2013-06-16 ENCOUNTER — Ambulatory Visit: Payer: Medicaid Other | Attending: Family Medicine | Admitting: Internal Medicine

## 2013-06-16 VITALS — BP 123/82 | HR 55 | Temp 98.4°F | Resp 16 | Ht 66.0 in | Wt 157.0 lb

## 2013-06-16 DIAGNOSIS — I471 Supraventricular tachycardia, unspecified: Secondary | ICD-10-CM

## 2013-06-16 DIAGNOSIS — Z09 Encounter for follow-up examination after completed treatment for conditions other than malignant neoplasm: Secondary | ICD-10-CM | POA: Insufficient documentation

## 2013-06-16 DIAGNOSIS — R42 Dizziness and giddiness: Secondary | ICD-10-CM | POA: Insufficient documentation

## 2013-06-16 DIAGNOSIS — G43909 Migraine, unspecified, not intractable, without status migrainosus: Secondary | ICD-10-CM

## 2013-06-16 DIAGNOSIS — I10 Essential (primary) hypertension: Secondary | ICD-10-CM

## 2013-06-16 DIAGNOSIS — I4719 Other supraventricular tachycardia: Secondary | ICD-10-CM

## 2013-06-16 DIAGNOSIS — K219 Gastro-esophageal reflux disease without esophagitis: Secondary | ICD-10-CM

## 2013-06-16 DIAGNOSIS — R1013 Epigastric pain: Secondary | ICD-10-CM | POA: Insufficient documentation

## 2013-06-16 MED ORDER — BUSPIRONE HCL 15 MG PO TABS: 15.0000 mg | ORAL_TABLET | Freq: Two times a day (BID) | ORAL | Status: DC

## 2013-06-16 MED ORDER — OMEPRAZOLE 40 MG PO CPDR: 40.0000 mg | DELAYED_RELEASE_CAPSULE | Freq: Every day | ORAL | Status: DC

## 2013-06-16 MED ORDER — SUMATRIPTAN SUCCINATE 25 MG PO TABS: 25.0000 mg | ORAL_TABLET | ORAL | Status: DC | PRN

## 2013-06-16 MED ORDER — METOPROLOL TARTRATE 25 MG PO TABS: 25.0000 mg | ORAL_TABLET | Freq: Every day | ORAL | Status: DC

## 2013-06-16 NOTE — Progress Notes (Signed)
Pt here with sudden dizziness this am with nausea. Taking prescribed Topamax but states its not working Medication refill

## 2013-06-16 NOTE — Progress Notes (Signed)
Patient ID: Katherine [name], female   DOB: 05/16/1971, 42 y.o.   MRN: [id number] Patient Demographics  Katherine [name], is a 42 y.o. female  AOZ:[id number]  NGE:[id number]  DOB - 1971/07/16  Chief Complaint  Patient presents with  . Follow-up  . Dizziness        Subjective:   Katherine [name] is a 42 y.o. female here today for a follow up visit. Patient complains about dizziness. Also complained that he still has the abdominal pain in the epigastric region, usually before eating, sometimes after eating she feels nauseous reflux. No fever. No change in bowel habit. Patient also complains about flareup of her migraine headache with photophobia. No medication seems to work, she claims cigarettes helps to calm down Patient has No chest pain, No new weakness tingling or numbness, No Cough - SOB.  ALLERGIES:   Allergies  Allergen Reactions  . Morphine Anaphylaxis, Hives and Swelling  . Shellfish Allergy Anaphylaxis  . Codeine Hives  . Hydrocodone Hives  . Penicillins Other (See Comments)    Yeast infection    PAST MEDICAL HISTORY: Past Medical History  Diagnosis Date  . Pneumonia     with cavitation of left lower lobe  . Asthma   . Migraine   . Hot flashes   . Palpitations   . Chest pressure   . Anxiety   . Fibromyalgia     MEDICATIONS AT HOME: Prior to Admission medications   Medication Sig Start Date End Date Taking? Authorizing Provider  albuterol (VENTOLIN HFA) 108 (90 BASE) MCG/ACT inhaler Inhale 2 puffs into the lungs every 6 (six) hours as needed. For shortness of breath 03/31/13  Yes Calvert Cantor, MD  busPIRone (BUSPAR) 15 MG tablet Take 1 tablet (15 mg total) by mouth 2 (two) times daily. 06/16/13 06/16/14 Yes Jeanann Lewandowsky, MD  cetirizine (ZYRTEC) 10 MG tablet Take 1 tablet (10 mg total) by mouth daily. 06/02/13  Yes Leroy Sea, MD  fluticasone (FLONASE) 50 MCG/ACT nasal spray Place 2 sprays into the nose daily. 06/02/13  Yes Leroy Sea, MD  metoprolol  tartrate (LOPRESSOR) 25 MG tablet Take 1 tablet (25 mg total) by mouth daily. 06/16/13  Yes Jeanann Lewandowsky, MD  olopatadine (PATANOL) 0.1 % ophthalmic solution Place 1 drop into the right eye 2 (two) times daily. 03/31/13  Yes Calvert Cantor, MD  topiramate (TOPAMAX) 25 MG tablet Take 1 tablet (25 mg total) by mouth 2 (two) times daily. 03/31/13  Yes Calvert Cantor, MD  omeprazole (PRILOSEC) 40 MG capsule Take 1 capsule (40 mg total) by mouth daily. 06/16/13   Jeanann Lewandowsky, MD  SUMAtriptan (IMITREX) 25 MG tablet Take 1 tablet (25 mg total) by mouth every 2 (two) hours as needed for migraine. May repeat in 2 hours if headache persists or recurs. 06/16/13   Jeanann Lewandowsky, MD     Objective:   Filed Vitals:   06/16/13 1009  BP: 123/82  Pulse: 55  Temp: 98.4 F (36.9 C)  TempSrc: Oral  Resp: 16  Height: 5\' 6"  (1.676 m)  Weight: 157 lb (71.215 kg)  SpO2: 98%    Exam General appearance :Awake, alert, not in any distress. Speech Clear. Not toxic Looking HEENT: Atraumatic and Normocephalic, pupils equally reactive to light and accomodation Neck: supple, no JVD. No cervical lymphadenopathy.  Chest:Good air entry bilaterally, no added sounds  CVS: S1 S2 regular, no murmurs. Bradycardia Abdomen: Bowel sounds present, Non tender and not distended with no gaurding, rigidity or rebound. Extremities: B/L  Lower Ext shows no edema, both legs are warm to touch Neurology: Awake alert, and oriented X 3, CN II-XII intact, Non focal Skin:No Rash Wounds:N/A   Data Review   CBC No results found for this basename: WBC, HGB, HCT, PLT, MCV, MCH, MCHC, RDW, NEUTRABS, LYMPHSABS, MONOABS, EOSABS, BASOSABS, BANDABS, BANDSABD,  in the last 168 hours  Chemistries   No results found for this basename: NA, K, CL, CO2, GLUCOSE, BUN, CREATININE, GFRCGP, CALCIUM, MG, AST, ALT, ALKPHOS, BILITOT,  in the last 168  hours ------------------------------------------------------------------------------------------------------------------ No results found for this basename: HGBA1C,  in the last 72 hours ------------------------------------------------------------------------------------------------------------------ No results found for this basename: CHOL, HDL, LDLCALC, TRIG, CHOLHDL, LDLDIRECT,  in the last 72 hours ------------------------------------------------------------------------------------------------------------------ No results found for this basename: TSH, T4TOTAL, FREET3, T3FREE, THYROIDAB,  in the last 72 hours ------------------------------------------------------------------------------------------------------------------ No results found for this basename: VITAMINB12, FOLATE, FERRITIN, TIBC, IRON, RETICCTPCT,  in the last 72 hours  Coagulation profile  No results found for this basename: INR, PROTIME,  in the last 168 hours    Assessment & Plan   Patient Active Problem List   Diagnosis Date Noted  . GERD (gastroesophageal reflux disease) 06/16/2013  . Migraine 06/16/2013  . HTN (hypertension) 06/16/2013  . Atrial tachycardia, paroxysmal 03/31/2013  . Headache 12/20/2012  . Syncope 06/20/2012  . Leiomyoma of uterus, unspecified 03/26/2012  . Family history of breast cancer 01/22/2012  . MYALGIA 12/09/2010  . ALLERGIC RHINITIS 07/18/2010  . HAIR LOSS 07/04/2010  . PRURITUS 12/15/2009  . ANXIETY DISORDER, SITUATIONAL, MILD 11/17/2009  . PALPITATIONS 10/12/2009  . CHEST PAIN, LEFT 10/12/2009  . DYSURIA 08/26/2009  . PNEUMONIA, LEFT LOWER LOBE 07/21/2009  . FOLLICULITIS 05/10/2009  . MAMMOGRAM, ABNORMAL, RIGHT, HX OF 04/09/2009  . TOBACCO ABUSE 03/17/2009  . DECREASED HEARING, RIGHT EAR 10/27/2008  . MUSCLE STRAIN, ABDOMINAL WALL 06/11/2008  . BACK PAIN 01/27/2008  . MIGRAINE HEADACHE 07/31/2007  . BACK PAIN, LUMBAR 06/06/2007  . GERD 07/18/2006  . HOT FLASHES 07/18/2006   . ACNE VULGARIS 07/18/2006     Plan: Reduce metoprolol tablet to 25 mg by mouth daily instead of twice daily, and he stopped the next visit if heart rate is below 60 Increased omeprazole tablets to 40 mg tablet by mouth daily Increased buspirone tablets to 15 mg by mouth twice a day Sumatriptan 25 mg tablet by mouth once, repeat in 2 hours if headache persists. Patient has been counseled extensively about migraine headache in the use of sumatriptan, not to exceed 2 tablets at anytime, only to keep the remaining medication for future use  Patient has been counseled extensively about smoking cessation Patient counseled about nutrition and exercise   Follow up in 2 weeks for followup   The patient was given clear instructions to go to ER or return to medical center if symptoms don't improve, worsen or new problems develop. The patient verbalized understanding. The patient was told to call to get lab results if they haven't heard anything in the next week.    Jeanann Lewandowsky, MD, MHA, FACP [facility] and [facility] [place], [place] [phone]   06/16/2013, 11:12 AM

## 2013-06-30 ENCOUNTER — Ambulatory Visit: Payer: Medicaid Other | Admitting: Internal Medicine

## 2013-07-23 ENCOUNTER — Encounter: Payer: Self-pay | Admitting: Obstetrics & Gynecology

## 2013-07-23 ENCOUNTER — Other Ambulatory Visit: Payer: Self-pay | Admitting: Obstetrics & Gynecology

## 2013-07-23 ENCOUNTER — Ambulatory Visit (INDEPENDENT_AMBULATORY_CARE_PROVIDER_SITE_OTHER): Payer: Medicaid Other | Admitting: Obstetrics & Gynecology

## 2013-07-23 ENCOUNTER — Other Ambulatory Visit (HOSPITAL_COMMUNITY)
Admission: RE | Admit: 2013-07-23 | Discharge: 2013-07-23 | Disposition: A | Discharge status: Home / Self Care | Payer: Medicaid Other | Source: Ambulatory Visit | Attending: Obstetrics & Gynecology | Admitting: Obstetrics & Gynecology

## 2013-07-23 VITALS — BP 116/78 | HR 56 | Temp 97.8°F | Ht 65.0 in | Wt 150.7 lb

## 2013-07-23 DIAGNOSIS — Z01419 Encounter for gynecological examination (general) (routine) without abnormal findings: Secondary | ICD-10-CM | POA: Insufficient documentation

## 2013-07-23 DIAGNOSIS — Z1231 Encounter for screening mammogram for malignant neoplasm of breast: Secondary | ICD-10-CM

## 2013-07-23 DIAGNOSIS — Z1151 Encounter for screening for human papillomavirus (HPV): Secondary | ICD-10-CM | POA: Insufficient documentation

## 2013-07-23 NOTE — Progress Notes (Signed)
Patient ID: Katherine [name], female   DOB: [birth year], 42 y.o.   MRN: [id number] Subjective:     Katherine [name] is a 42 y.o. female here for a routine exam.  Current complaints: none at this time.  Personal health questionnaire reviewed: yes.  Ms. Katherine [name] denies any concerns or changes to her breasts, and she denies any nipple discharge.  She also denies any abdominal or pelvic pains, irregular menses, dysuria, vaginal discharge or odor.  She does report a FHx of breast cancer in her oldest sister; diagnosed at 21 and died at 55 y/o.  She states that she started getting mammograms personally at age 23 y/o.  She also claims that her last mammogram was last year, but records indicate this appointment was missed and her last actual images were in 2012. She wants to f/u with her annual mammograms per recommendations.    Gynecologic History Patient's last menstrual period was 07/14/2013. Contraception: tubal ligation Last Pap: 07/27/2011. Results were: normal Last mammogram: 2012. Pt. Was scheduled in 2013, but was a no-show at the appointment. Results were: normal  Obstetric History OB History  Gravida Para Term Preterm AB SAB TAB Ectopic Multiple Living  2 2 1 1      2     # Outcome Date GA Lbr Len/2nd Weight Sex Delivery Anes PTL Lv  2 PRE           1 TRM                The following portions of the patient's history were reviewed and updated as appropriate: allergies, current medications, past family history, past medical history, past social history and past surgical history.  Review of Systems A comprehensive review of systems was negative.    Objective:    BP 116/78  Pulse 56  Temp(Src) 97.8 F (36.6 C) (Oral)  Ht 5\' 5"  (1.651 m)  Wt 68.357 kg (150 lb 11.2 oz)  BMI 25.08 kg/m2  LMP 07/14/2013  General Appearance:    Alert, cooperative, no distress, appears stated age  Head:    Normocephalic, without obvious abnormality, atraumatic  Eyes:     Conjunctiva/corneas clear, EOM's  intact, Pt. Wearing glasses  Ears:    Both ears symmetric  Nose:   Nares normal, septum midline, no nasal discharge     Neck:   Supple, symmetrical, trachea midline, no adenopathy;    thyroid:  no enlargement/tenderness/nodules  Back:     Symmetric, no curvature, ROM normal,   Lungs:     Clear to auscultation bilaterally, respirations unlabored  Chest Wall:    No tenderness or deformity   Heart:    Regular rate and rhythm, S1 and S2 normal, no murmur, rub   or gallop  Breast Exam:    No tenderness, masses, or nipple abnormality  Abdomen:     Soft, non-tender, bowel sounds active all four quadrants,    no masses, no organomegaly  Genitalia:    Normal female without lesion, discharge or tenderness     Extremities:   Extremities normal, atraumatic, no cyanosis or edema  Pulses:   2+ and symmetric all extremities  Skin:   Skin color, texture, turgor normal, no rashes or lesions  Lymph nodes:   Cervical, supraclavicular, and axillary nodes normal  Neurologic:   CNII-XII intact, normal strength, sensation and reflexes    throughout      Assessment:    Healthy female exam.    Plan:    Education reviewed: self  breast exams and smoking cessation. Contraception: tubal ligation. Mammogram ordered. Follow up in: 1 year.

## 2013-07-23 NOTE — Addendum Note (Signed)
Addended by: Candelaria Stagers E on: 07/23/2013 03:36 PM   Modules accepted: Orders

## 2013-07-24 ENCOUNTER — Ambulatory Visit (HOSPITAL_COMMUNITY)
Admission: RE | Admit: 2013-07-24 | Discharge: 2013-07-24 | Disposition: A | Discharge status: Home / Self Care | Payer: Medicaid Other | Source: Ambulatory Visit | Attending: Obstetrics & Gynecology | Admitting: Obstetrics & Gynecology

## 2013-07-24 DIAGNOSIS — Z1231 Encounter for screening mammogram for malignant neoplasm of breast: Secondary | ICD-10-CM | POA: Insufficient documentation

## 2013-09-05 ENCOUNTER — Encounter: Payer: Self-pay | Admitting: Internal Medicine

## 2013-09-05 ENCOUNTER — Ambulatory Visit: Payer: Medicaid Other | Attending: Internal Medicine | Admitting: Internal Medicine

## 2013-09-05 VITALS — BP 112/76 | HR 60 | Temp 98.9°F | Resp 14 | Ht 66.0 in | Wt 150.0 lb

## 2013-09-05 DIAGNOSIS — G500 Trigeminal neuralgia: Secondary | ICD-10-CM

## 2013-09-05 DIAGNOSIS — H538 Other visual disturbances: Secondary | ICD-10-CM

## 2013-09-05 DIAGNOSIS — R21 Rash and other nonspecific skin eruption: Secondary | ICD-10-CM | POA: Insufficient documentation

## 2013-09-05 LAB — CBC WITH DIFFERENTIAL/PLATELET
Basophils Absolute: 0 10*3/uL (ref 0.0–0.1)
Basophils Relative: 0 % (ref 0–1)
Eosinophils Absolute: 0.3 10*3/uL (ref 0.0–0.7)
MCH: 26.6 pg (ref 26.0–34.0)
MCHC: 33.7 g/dL (ref 30.0–36.0)
Monocytes Absolute: 0.5 10*3/uL (ref 0.1–1.0)
Neutrophils Relative %: 40 % — ABNORMAL LOW (ref 43–77)
Platelets: 217 10*3/uL (ref 150–400)
RBC: 4.7 MIL/uL (ref 3.87–5.11)
RDW: 14.4 % (ref 11.5–15.5)
WBC: 7.9 10*3/uL (ref 4.0–10.5)

## 2013-09-05 LAB — COMPLETE METABOLIC PANEL WITH GFR
ALT: 11 U/L (ref 0–35)
AST: 13 U/L (ref 0–37)
Alkaline Phosphatase: 66 U/L (ref 39–117)
Chloride: 105 mEq/L (ref 96–112)
Creat: 0.6 mg/dL (ref 0.50–1.10)
GFR, Est Non African American: >89 mL/min
Glucose, Bld: 87 mg/dL (ref 70–99)
Sodium: 134 mEq/L — ABNORMAL LOW (ref 135–145)
Total Bilirubin: 0.3 mg/dL (ref 0.3–1.2)
Total Protein: 7.2 g/dL (ref 6.0–8.3)

## 2013-09-05 LAB — LIPID PANEL
HDL: 46 mg/dL (ref >39)
LDL Cholesterol: 84 mg/dL (ref 0–99)
Triglycerides: 76 mg/dL (ref <150)
VLDL: 15 mg/dL (ref 0–40)

## 2013-09-05 MED ORDER — FAMOTIDINE 20 MG PO TABS: 20.0000 mg | ORAL_TABLET | Freq: Two times a day (BID) | ORAL | Status: DC

## 2013-09-05 MED ORDER — PREDNISONE 20 MG PO TABS: 20.0000 mg | ORAL_TABLET | Freq: Every day | ORAL | Status: AC

## 2013-09-05 MED ORDER — CARBAMAZEPINE 100 MG PO CHEW: 100.0000 mg | CHEWABLE_TABLET | Freq: Two times a day (BID) | ORAL | Status: DC

## 2013-09-05 MED ORDER — HYDROCORTISONE 1 % EX LOTN: 1.0000 "application " | TOPICAL_LOTION | Freq: Two times a day (BID) | CUTANEOUS | Status: DC

## 2013-09-05 MED ORDER — HYDROXYZINE HCL 10 MG PO TABS: 10.0000 mg | ORAL_TABLET | Freq: Three times a day (TID) | ORAL | Status: DC | PRN

## 2013-09-05 NOTE — Progress Notes (Signed)
Patient ID: Katherine [name], female   DOB: Jan 15, 1971, 42 y.o.   MRN: [id number]  CC:  HPI: 42 year old female with a history of palpitations, anxiety, fibromyalgia, who presents with multiple complaints. She states that she has noticed a maculopapular itchy rash on her face her neck and her front chest the last several months she feels that she is allergic to multiple foods including tomatoes. She is requesting to see an allergist.  She also complains of sharp shooting pain on her left temple with pain radiating from her temple into her left ear. This has been ongoing for a month.  She denies any palpitations.  She also complains of blurry vision and is requesting an ophthalmology consult She is also requesting the [place] questionnaire to be filled out    Allergies  Allergen Reactions  . Morphine Anaphylaxis, Hives and Swelling  . Shellfish Allergy Anaphylaxis  . Codeine Hives  . Hydrocodone Hives  . Penicillins Other (See Comments)    Yeast infection   Past Medical History  Diagnosis Date  . Pneumonia     with cavitation of left lower lobe  . Asthma   . Migraine   . Hot flashes   . Palpitations   . Chest pressure   . Anxiety   . Fibromyalgia    Current Outpatient Prescriptions on File Prior to Visit  Medication Sig Dispense Refill  . albuterol (VENTOLIN HFA) 108 (90 BASE) MCG/ACT inhaler Inhale 2 puffs into the lungs every 6 (six) hours as needed. For shortness of breath  1 each  3  . busPIRone (BUSPAR) 15 MG tablet Take 1 tablet (15 mg total) by mouth 2 (two) times daily.  30 tablet  1  . fluticasone (FLONASE) 50 MCG/ACT nasal spray Place 2 sprays into the nose daily.  16 g  1  . metoprolol tartrate (LOPRESSOR) 25 MG tablet Take 1 tablet (25 mg total) by mouth daily.  60 tablet  1  . omeprazole (PRILOSEC) 40 MG capsule Take 1 capsule (40 mg total) by mouth daily.  30 capsule  3  . olopatadine (PATANOL) 0.1 % ophthalmic solution Place 1 drop into the right  eye 2 (two) times daily.  5 mL  12  . topiramate (TOPAMAX) 25 MG tablet Take 1 tablet (25 mg total) by mouth 2 (two) times daily.  30 tablet  0  . [DISCONTINUED] diltiazem (CARDIZEM SR) 120 MG 12 hr capsule Take 1 capsule (120 mg total) by mouth daily.  30 capsule  6   No current facility-administered medications on file prior to visit.   Family History  Problem Relation Age of Onset  . Migraines Daughter   . Migraines Son   . Hypertension    . Hypertension Mother   . Hypertension Father   . Colon cancer Neg Hx   . Colon polyps Neg Hx   . Diabetes Neg Hx   . Heart disease Mother   . Heart disease Father   . Kidney disease Neg Hx   . Liver disease Neg Hx   . Breast cancer Sister 82    deceased at age 44   History   Social History  . Marital Status: Single    Spouse Name: N/A    Number of Children: N/A  . Years of Education: N/A   Occupational History  . Not on file.   Social History Main Topics  . Smoking status: Current Some Day Smoker -- 0.05 packs/day    Types: Cigarettes  .  Smokeless tobacco: Never Used     Comment: form given 01-20-13  . Alcohol Use: No  . Drug Use: No     Comment: pt states she has d/c marijuana  . Sexual Activity: Yes    Birth Control/ Protection: Surgical   Other Topics Concern  . Not on file   Social History Narrative   On disability for heart condition.  Pt. Was non-specific.     Review of Systems  Constitutional: Negative for fever, chills, diaphoresis, activity change, appetite change and fatigue.  HENT: Negative for ear pain, nosebleeds, congestion, facial swelling, rhinorrhea, neck pain, neck stiffness and ear discharge.   Eyes: Negative for pain, discharge, redness, itching and visual disturbance.  Respiratory: Negative for cough, choking, chest tightness, shortness of breath, wheezing and stridor.   Cardiovascular: Negative for chest pain, palpitations and leg swelling.  Gastrointestinal: Negative for abdominal distention.   Genitourinary: Negative for dysuria, urgency, frequency, hematuria, flank pain, decreased urine volume, difficulty urinating and dyspareunia.  Musculoskeletal: Negative for back pain, joint swelling, arthralgias and gait problem.  Neurological: Negative for dizziness, tremors, seizures, syncope, facial asymmetry, speech difficulty, weakness, light-headedness, numbness and headaches.  Hematological: Negative for adenopathy. Does not bruise/bleed easily.  Psychiatric/Behavioral: Negative for hallucinations, behavioral problems, confusion, dysphoric mood, decreased concentration and agitation.    Objective:   Filed Vitals:   09/05/13 1432  BP: 112/76  Pulse: 60  Temp: 98.9 F (37.2 C)  Resp: 14    Physical Exam  Constitutional: Appears well-developed and well-nourished. No distress.  HENT: Normocephalic. External right and left ear normal. Oropharynx is clear and moist.  Eyes: Conjunctivae and EOM are normal. PERRLA, no scleral icterus.  Neck: Normal ROM. Neck supple. No JVD. No tracheal deviation. No thyromegaly.  CVS: RRR, S1/S2 +, no murmurs, no gallops, no carotid bruit.  Pulmonary: Effort and breath sounds normal, no stridor, rhonchi, wheezes, rales.  Abdominal: Soft. BS +,  no distension, tenderness, rebound or guarding.  Musculoskeletal: Normal range of motion. No edema and no tenderness.  Lymphadenopathy: No lymphadenopathy noted, cervical, inguinal. Neuro: Alert. Normal reflexes, muscle tone coordination. No cranial nerve deficit. Skin: Skin is warm and dry. No rash noted. Not diaphoretic. No erythema. No pallor.  Psychiatric: Normal mood and affect. Behavior, judgment, thought content normal.   Lab Results  Component Value Date   WBC 5.9 12/29/2012   HGB 11.5* 12/29/2012   HCT 34.4* 12/29/2012   MCV 79.1 12/29/2012   PLT 186 12/29/2012   Lab Results  Component Value Date   CREATININE 0.58 12/29/2012   BUN 8 12/29/2012   NA 139 12/29/2012   K 4.0 12/29/2012   CL 107 12/29/2012    CO2 24 12/29/2012    No results found for this basename: HGBA1C   Lipid Panel     Component Value Date/Time   CHOL 138 06/02/2013 1027   TRIG 93 06/02/2013 1027   HDL 47 06/02/2013 1027   CHOLHDL 2.9 06/02/2013 1027   VLDL 19 06/02/2013 1027   LDLCALC 72 06/02/2013 1027       Assessment and plan:   Patient Active Problem List   Diagnosis Date Noted  . GERD (gastroesophageal reflux disease) 06/16/2013  . Migraine 06/16/2013  . HTN (hypertension) 06/16/2013  . Atrial tachycardia, paroxysmal 03/31/2013  . Headache 12/20/2012  . Syncope 06/20/2012  . Leiomyoma of uterus, unspecified 03/26/2012  . Family history of breast cancer 01/22/2012  . MYALGIA 12/09/2010  . ALLERGIC RHINITIS 07/18/2010  . HAIR LOSS 07/04/2010  .  PRURITUS 12/15/2009  . ANXIETY DISORDER, SITUATIONAL, MILD 11/17/2009  . PALPITATIONS 10/12/2009  . CHEST PAIN, LEFT 10/12/2009  . DYSURIA 08/26/2009  . PNEUMONIA, LEFT LOWER LOBE 07/21/2009  . FOLLICULITIS 05/10/2009  . MAMMOGRAM, ABNORMAL, RIGHT, HX OF 04/09/2009  . TOBACCO ABUSE 03/17/2009  . DECREASED HEARING, RIGHT EAR 10/27/2008  . MUSCLE STRAIN, ABDOMINAL WALL 06/11/2008  . BACK PAIN 01/27/2008  . MIGRAINE HEADACHE 07/31/2007  . BACK PAIN, LUMBAR 06/06/2007  . GERD 07/18/2006  . HOT FLASHES 07/18/2006  . ACNE VULGARIS 07/18/2006       Possible trigeminal neuralgia Trial off carbamazepine 100 mg twice a day She will need CBC and liver function testing in 2 months If no improvement urology referral will be provided  Rash nonspecific Will give a course of prednisone and hydroxyzine Allergies immunology referral provided    Ophthalmology referral provided, for blurry vision  Follow up in 2 months   The patient was given clear instructions to go to ER or return to medical center if symptoms don't improve, worsen or new problems develop. The patient verbalized understanding. The patient was told to call to get any lab results if not heard anything in  the next week.

## 2013-09-05 NOTE — Addendum Note (Signed)
Addended by: Susie Cassette MD, Germain Osgood on: 09/05/2013 03:17 PM   Modules accepted: Orders

## 2013-09-05 NOTE — Progress Notes (Signed)
Pt is here for a follow up; may be allergic to tomatoes. Face, neck and back are breaking out (rash), extreme itching and redness x2 months. Light headedness x1 month with nausea.

## 2013-09-09 ENCOUNTER — Telehealth: Payer: Self-pay | Admitting: *Deleted

## 2013-09-09 NOTE — Telephone Encounter (Signed)
cotnacted pt to inform her of her lab results. Pt was not available. Left a message with Christ Kick for pt to give me a call back.

## 2013-09-10 ENCOUNTER — Telehealth: Payer: Self-pay | Admitting: *Deleted

## 2013-09-10 NOTE — Telephone Encounter (Signed)
Returned a phone call from pt. Informed pt that her lab results came back normal. Pt acknowledged information and call was completed effectively.

## 2013-10-15 ENCOUNTER — Other Ambulatory Visit: Payer: Medicaid Other

## 2013-10-15 ENCOUNTER — Ambulatory Visit (INDEPENDENT_AMBULATORY_CARE_PROVIDER_SITE_OTHER): Payer: Medicaid Other | Admitting: Internal Medicine

## 2013-10-15 ENCOUNTER — Encounter: Payer: Self-pay | Admitting: Internal Medicine

## 2013-10-15 VITALS — BP 112/80 | HR 73 | Ht 65.0 in | Wt 155.0 lb

## 2013-10-15 DIAGNOSIS — L500 Allergic urticaria: Secondary | ICD-10-CM

## 2013-10-15 DIAGNOSIS — L509 Urticaria, unspecified: Secondary | ICD-10-CM

## 2013-10-15 DIAGNOSIS — J302 Other seasonal allergic rhinitis: Secondary | ICD-10-CM

## 2013-10-15 DIAGNOSIS — J3089 Other allergic rhinitis: Secondary | ICD-10-CM

## 2013-10-15 DIAGNOSIS — J309 Allergic rhinitis, unspecified: Secondary | ICD-10-CM

## 2013-10-15 MED ORDER — MONTELUKAST SODIUM 10 MG PO TABS: 10.0000 mg | ORAL_TABLET | Freq: Every day | ORAL | Status: DC

## 2013-10-15 NOTE — Patient Instructions (Signed)
Order- lab- Allergy profile, Food IgE profile, Food IgG profile  Try washing clothes in hypoallergenic laundry products  Like Dreft and Downy  Try medications: Allegra 180  1 daily- H1 antihistamine Zantac 150 mg twice daily before meals-  H2 antihistamine and acid blocker Script for Singulair anti-inflammatory      1 daily

## 2013-10-15 NOTE — Progress Notes (Signed)
Subjective:    Patient ID: Katherine [name], female    DOB: Aug 29, 1971, 43 y.o.   MRN: [id number]  HPI 10/15/13- 16 yoF occasional smoker Referred by Dr. Allyson Sabal for allergy symptoms. Here with pregnant daughter She complains that eyes swell, she gets welts/times, seasonal nasal congestion sneezing and drainage. No prior history of rash. Asthma as a child. Not sure what causes hives-questions foods but none in particular. Stress and heat makes skin worse. A topical cream and hydroxyzine have helped. She has taken short course prednisone, Pepcid, hydroxyzine. Benadryl causes nausea. Has used a nasal sprays but does not know the names. No history of intolerance to latex or aspirin.she is now off all medications. Was not raised by parents and does not know family health history reliably.  Prior to Admission medications   Medication Sig Start Date End Date Taking? Authorizing Provider  albuterol (VENTOLIN HFA) 108 (90 BASE) MCG/ACT inhaler Inhale 2 puffs into the lungs every 6 (six) hours as needed. For shortness of breath 03/31/13  Yes Debbe Odea, MD  carbamazepine (TEGRETOL) 100 MG chewable tablet Chew 1 tablet (100 mg total) by mouth 2 (two) times daily. 09/05/13  Yes Reyne Dumas, MD  hydrocortisone 1 % lotion Apply 1 application topically 2 (two) times daily. 09/05/13  Yes Reyne Dumas, MD  hydrOXYzine (ATARAX/VISTARIL) 10 MG tablet Take 1 tablet (10 mg total) by mouth 3 (three) times daily as needed. 09/05/13  Yes Reyne Dumas, MD  predniSONE (DELTASONE) 10 MG tablet Take 10 mg by mouth daily with breakfast.   Yes Historical Provider, MD  busPIRone (BUSPAR) 15 MG tablet Take 1 tablet (15 mg total) by mouth 2 (two) times daily. 11/06/13 11/06/14  Doran Heater, MD  cetirizine (ZYRTEC) 10 MG tablet Take 10 mg by mouth daily.    Historical Provider, MD  famotidine (PEPCID) 20 MG tablet Take 1 tablet (20 mg total) by mouth 2 (two) times daily. 11/06/13   Doran Heater, MD  loratadine (CLARITIN) 10  MG tablet Take 1 tablet (10 mg total) by mouth daily. 11/06/13   Doran Heater, MD  metoprolol tartrate (LOPRESSOR) 25 MG tablet Take 0.5 tablets (12.5 mg total) by mouth 2 (two) times daily. 11/06/13   Doran Heater, MD  montelukast (SINGULAIR) 10 MG tablet Take 1 tablet (10 mg total) by mouth at bedtime. 10/15/13   Deneise Lever, MD  olopatadine (PATANOL) 0.1 % ophthalmic solution Place 1 drop into the right eye 2 (two) times daily. 03/31/13   Debbe Odea, MD  omeprazole (PRILOSEC) 40 MG capsule Take 1 capsule (40 mg total) by mouth daily. 11/06/13   Doran Heater, MD  permethrin (ELIMITE) 5 % cream Apply 1 application topically once. Apply and rinse off after 8-12 hours. Repeat in 1 week. 11/06/13   Doran Heater, MD  topiramate (TOPAMAX) 25 MG tablet Take 1 tablet (25 mg total) by mouth 2 (two) times daily. 11/06/13   Doran Heater, MD   Past Medical History  Diagnosis Date  . Pneumonia     with cavitation of left lower lobe  . Asthma   . Migraine   . Hot flashes   . Palpitations   . Chest pressure   . Anxiety   . Fibromyalgia    Past Surgical History  Procedure Laterality Date  . Tubal ligation  1993   Family History  Problem Relation Age of Onset  . Migraines Daughter   . Migraines Son   . Hypertension    .  Hypertension Mother   . Hypertension Father   . Colon cancer Neg Hx   . Colon polyps Neg Hx   . Diabetes Neg Hx   . Heart disease Mother   . Heart disease Father   . Kidney disease Neg Hx   . Liver disease Neg Hx   . Breast cancer Sister 71    deceased at age 40   History   Social History  . Marital Status: Single    Spouse Name: N/A    Number of Children: N/A  . Years of Education: N/A   Occupational History  . Not on file.   Social History Main Topics  . Smoking status: Current Some Day Smoker -- 0.05 packs/day    Types: Cigarettes  . Smokeless tobacco: Never Used     Comment: form given 01-20-13  . Alcohol Use: No  . Drug Use: No     Comment: pt  states she has d/c marijuana  . Sexual Activity: Yes    Birth Control/ Protection: Surgical   Other Topics Concern  . Not on file   Social History Narrative   On disability for heart condition.  Pt. Was non-specific.    He Review of Systems  Constitutional: Negative for fever, chills, diaphoresis, activity change, appetite change, fatigue and unexpected weight change.  HENT: Positive for congestion and sneezing. Negative for dental problem, ear discharge, ear pain, facial swelling, hearing loss, mouth sores, nosebleeds, postnasal drip, rhinorrhea, sinus pressure, sore throat, tinnitus, trouble swallowing and voice change.   Eyes: Negative for photophobia, discharge, itching and visual disturbance.  Respiratory: Positive for shortness of breath. Negative for apnea, cough, choking, chest tightness, wheezing and stridor.   Cardiovascular: Negative for chest pain, palpitations and leg swelling.  Gastrointestinal: Negative for nausea, vomiting, abdominal pain, constipation, blood in stool and abdominal distention.       Acid Heartburn  Genitourinary: Negative for dysuria, urgency, frequency, hematuria, flank pain, decreased urine volume and difficulty urinating.  Musculoskeletal: Negative for arthralgias, back pain, gait problem, joint swelling, myalgias, neck pain and neck stiffness.  Skin: Positive for rash. Negative for color change and pallor.       Itching  Neurological: Positive for headaches. Negative for dizziness, tremors, seizures, syncope, speech difficulty, weakness, light-headedness and numbness.  Hematological: Negative for adenopathy. Does not bruise/bleed easily.  Psychiatric/Behavioral: Negative for confusion, sleep disturbance and agitation. The patient is not nervous/anxious.        Objective:   Physical Exam  OBJ- Physical Exam General- Alert, Oriented, Affect-appropriate, Distress- none acute Skin- rash-none today, lesions- none, excoriation- none Lymphadenopathy-  none Head- atraumatic            Eyes- Gross vision intact, PERRLA, conjunctivae and secretions clear            Ears- Hearing, canals-normal            Nose- Clear, no-Septal dev, mucus, polyps, erosion, perforation             Throat- Mallampati II , mucosa clear , drainage- none, tonsils- atrophic Neck- flexible , trachea midline, no stridor , thyroid nl, carotid no bruit Chest - symmetrical excursion , unlabored           Heart/CV- RRR , no murmur , no gallop  , no rub, nl s1 s2                           - JVD- none , edema-  none, stasis changes- none, varices- none           Lung- clear to P&A, wheeze- none, cough- none , dullness-none, rub- none           Chest wall-  Abd- tender-no, distended-no, bowel sounds-present, HSM- no Br/ Gen/ Rectal- Not done, not indicated Extrem- cyanosis- none, clubbing, none, atrophy- none, strength- nl Neuro- grossly intact to observation        Assessment & Plan:

## 2013-10-16 LAB — ALLERGY FULL PROFILE
Allergen, D pternoyssinus,d7: 4.77 kU/L — ABNORMAL HIGH
Allergen,Goose feathers, e70: 0.15 kU/L — ABNORMAL HIGH
Aspergillus fumigatus, m3: <0.1 kU/L
BERMUDA GRASS: 0.63 kU/L — AB
Bahia Grass: <0.1 kU/L
Box Elder IgE: <0.1 kU/L
CANDIDA ALBICANS: 0.13 kU/L — AB
Cat Dander: <0.1 kU/L
Common Ragweed: 0.23 kU/L — ABNORMAL HIGH
Curvularia lunata: <0.1 kU/L
D. farinae: 6.19 kU/L — ABNORMAL HIGH
Dog Dander: 0.12 kU/L — ABNORMAL HIGH
Elm IgE: <0.1 kU/L
Fescue: 0.22 kU/L — ABNORMAL HIGH
G009 Red Top: <0.1 kU/L
Goldenrod: 0.43 kU/L — ABNORMAL HIGH
Helminthosporium halodes: <0.1 kU/L
House Dust Hollister: 0.4 kU/L — ABNORMAL HIGH
LAMB'S QUARTERS CLASS: 0.24 kU/L — AB
Oak: <0.1 kU/L
Plantain: <0.1 kU/L
Stemphylium Botryosum: 0.37 kU/L — ABNORMAL HIGH
Sycamore Tree: <0.1 kU/L

## 2013-10-16 LAB — ALLERGEN FOOD PROFILE SPECIFIC IGE
Apple: <0.1 kU/L
Chicken IgE: 0.31 kU/L — ABNORMAL HIGH
Corn: <0.1 kU/L
Egg White IgE: <0.1 kU/L
FISH COD: 0.88 kU/L — AB
IGE (IMMUNOGLOBULIN E), SERUM: 531.3 [IU]/mL — AB (ref 0.0–180.0)
Shrimp IgE: 7.41 kU/L — ABNORMAL HIGH
Soybean IgE: <0.1 kU/L
Tomato IgE: <0.1 kU/L
Tuna IgE: 1.72 kU/L — ABNORMAL HIGH
Wheat IgE: <0.1 kU/L

## 2013-10-18 LAB — IGG FOOD PANEL
Allergen, Milk, IgG: 14.8 ug/mL — ABNORMAL HIGH (ref <0.15)
Beef, IgG: 5.4 ug/mL — ABNORMAL HIGH (ref <2.0)
Corn, IgG: <0.15 ug/mL (ref <0.15)
Egg white, IgG: 4.2 ug/mL — ABNORMAL HIGH (ref <2.0)
Egg yolk, IgG: 4.2 ug/mL — ABNORMAL HIGH (ref <2.0)
Peanut, IgG: <0.15 ug/mL (ref <0.15)
Wheat, IgG: <0.15 ug/mL (ref <0.15)

## 2013-10-21 ENCOUNTER — Telehealth: Payer: Self-pay | Admitting: Internal Medicine

## 2013-10-21 NOTE — Telephone Encounter (Signed)
OK order Epipen refill prn, "inject into thigh if needed for severe allergic reaction"

## 2013-10-21 NOTE — Progress Notes (Signed)
Quick Note:  Pt aware of results. ______ 

## 2013-10-21 NOTE — Telephone Encounter (Signed)
Pt aware that she will need to come by the office to be shown how to use the [facility] as she stated she was not comfortable using one. CY, please advise if okay to send Rx for [facility] and I will schedule time for patient to come in for teaching. Thanks.

## 2013-10-28 NOTE — Telephone Encounter (Signed)
LMTCB-ask for Katherine [name].  

## 2013-11-06 ENCOUNTER — Ambulatory Visit: Payer: Medicaid Other | Attending: Internal Medicine | Admitting: Family Medicine

## 2013-11-06 VITALS — BP 120/79 | HR 69 | Temp 98.6°F | Resp 14 | Ht 65.0 in | Wt 151.2 lb

## 2013-11-06 DIAGNOSIS — G43909 Migraine, unspecified, not intractable, without status migrainosus: Secondary | ICD-10-CM

## 2013-11-06 DIAGNOSIS — I4719 Other supraventricular tachycardia: Secondary | ICD-10-CM

## 2013-11-06 DIAGNOSIS — R21 Rash and other nonspecific skin eruption: Secondary | ICD-10-CM | POA: Insufficient documentation

## 2013-11-06 DIAGNOSIS — Z760 Encounter for issue of repeat prescription: Secondary | ICD-10-CM | POA: Insufficient documentation

## 2013-11-06 DIAGNOSIS — R51 Headache: Secondary | ICD-10-CM | POA: Insufficient documentation

## 2013-11-06 DIAGNOSIS — K219 Gastro-esophageal reflux disease without esophagitis: Secondary | ICD-10-CM

## 2013-11-06 DIAGNOSIS — R519 Headache, unspecified: Secondary | ICD-10-CM

## 2013-11-06 DIAGNOSIS — F411 Generalized anxiety disorder: Secondary | ICD-10-CM | POA: Insufficient documentation

## 2013-11-06 DIAGNOSIS — R000 Tachycardia, unspecified: Secondary | ICD-10-CM | POA: Insufficient documentation

## 2013-11-06 DIAGNOSIS — I471 Supraventricular tachycardia: Secondary | ICD-10-CM

## 2013-11-06 DIAGNOSIS — L299 Pruritus, unspecified: Secondary | ICD-10-CM

## 2013-11-06 MED ORDER — METOPROLOL TARTRATE 25 MG PO TABS: 25.0000 mg | ORAL_TABLET | Freq: Every day | ORAL | Status: DC

## 2013-11-06 MED ORDER — BUSPIRONE HCL 15 MG PO TABS: 15.0000 mg | ORAL_TABLET | Freq: Two times a day (BID) | ORAL | Status: DC

## 2013-11-06 MED ORDER — OMEPRAZOLE 40 MG PO CPDR: 40.0000 mg | DELAYED_RELEASE_CAPSULE | Freq: Every day | ORAL | Status: DC

## 2013-11-06 MED ORDER — FAMOTIDINE 20 MG PO TABS: 20.0000 mg | ORAL_TABLET | Freq: Two times a day (BID) | ORAL | Status: DC

## 2013-11-06 MED ORDER — TOPIRAMATE 25 MG PO TABS: 25.0000 mg | ORAL_TABLET | Freq: Two times a day (BID) | ORAL | Status: DC

## 2013-11-06 MED ORDER — PERMETHRIN 5 % EX CREA: 1.0000 "application " | TOPICAL_CREAM | Freq: Once | CUTANEOUS | Status: DC

## 2013-11-06 MED ORDER — LORATADINE 10 MG PO TABS: 10.0000 mg | ORAL_TABLET | Freq: Every day | ORAL | Status: DC

## 2013-11-06 MED ORDER — METOPROLOL TARTRATE 25 MG PO TABS: 12.5000 mg | ORAL_TABLET | Freq: Two times a day (BID) | ORAL | Status: DC

## 2013-11-06 NOTE — Progress Notes (Signed)
Patient is here for a follow up. Needs refills on all her medications. Patient has several allergies to medications and has some questions about current medications. Complains of itching all over her body and a burning sensation x2 weeks. Also complains of acne on her face and bilateral arms. Had an allergic reaction to spouse's cologne and her face swelled up. Patient also thinks that she is allergic to chicken.

## 2013-11-06 NOTE — Progress Notes (Signed)
Subjective:    Patient ID: Katherine [name], female    DOB: 1970-11-08, 43 y.o.   MRN: [id number]  HPI  Patient is here for followup. She would like her medications refilled. Unfortunately she is unclear as to what medications she hasn't has not been taking. She also does not know what some of her medications are for. She has a history of itching for the last 2 months. It started suddenly and she thought she had an allergic reaction. She has been given prednisone for this which she says helped somewhat but the itching returned. She saw pulmonology/allergy this morning and is scheduled for some blood work. She has tried taking Zyrtec and isn't sure if it helps or not. She doesn't know if anyone that she has been in contact with has similar symptoms. She says that the itching is primarily on her upper arms and trunk. It's been spreading outward over the last 2 months. She was prescribed Singulair this morning by the allergist. The patient seems to think she took this in the past as well but I don't see any record of it.  She has a history of paroxysmal atrial tachycardia and has taken metoprolol the past. The bottle she has today it was refilled in 2013 and his pretty full. There was a refill last July for metoprolol.  She does have a history of migraine headaches and does well with Topamax. She hasn't had any recent headaches.  She has a history of reflux and does well on proton X. and Zantac. She tells me that she had a biopsy taken of her stomach a couple of years ago but can't tell me what the result was.  She has a history of asthma but says she doesn't usually need to use her inhaler.  She has a history of anxiety and has done well on BuSpar in the past.  As mentioned it is very unclear what medications this patient is actually taking.    Review of Systems A 12 point review of systems is negative except as per hpi.       Objective:   Physical Exam Nursing note and vitals  reviewed. Constitutional: She is oriented to person, place, and time. She appears well-developed and well-nourished.  HENT:  Right Ear: External ear normal.  Left Ear: External ear normal.  Nose: Nose normal.  Mouth/Throat: Oropharynx is clear and moist. No oropharyngeal exudate.  Eyes: Conjunctivae are normal. Pupils are equal, round, and reactive to light.  Neck: Normal range of motion. Neck supple. No thyromegaly present.  Cardiovascular: Normal rate, regular rhythm and normal heart sounds.   Pulmonary/Chest: Effort normal and breath sounds normal.  Abdominal: Soft. Bowel sounds are normal. She exhibits no distension. There is no tenderness. There is no rebound.  Lymphadenopathy:    She has no cervical adenopathy.  Neurological: She is alert and oriented to person, place, and time. She has normal reflexes.  Skin: Skin is warm and dry.  scabbed over very small bumps on the upper arms back and chest. Starting to have some lesions on the hands as well. No secondary infections. This does not look like urticaria.  Psychiatric: She has a normal mood and affect. Her behavior is normal.         Assessment & Plan:  Sahiba was seen today for follow-up.  Diagnoses and associated orders for this visit:  GERD (gastroesophageal reflux disease) - omeprazole (PRILOSEC) 40 MG capsule; Take 1 capsule (40 mg total) by mouth daily. -  H. pylori antibody, IgA  Atrial tachycardia, paroxysmal - metoprolol tartrate (LOPRESSOR) 25 MG tablet; Take 0.5 tablets (12.5 mg total) by mouth 2 (two) times daily. Will restart her metoprolol but at half the dose that she was previously prescribed because I'm not sure that she has actually been taking it. Migrainey. - busPIRone (BUSPAR) 15 MG tablet; Take 1 tablet (15 mg total) by mouth 2 (two) times daily.  Itching - topiramate (TOPAMAX) 25 MG tablet; Take 1 tablet (25 mg total) by mouth 2 (two) times daily. - permethrin (ELIMITE) 5 % cream; Apply 1 application  topically once. Apply and rinse off after 8-12 hours. Repeat in 1 week. - loratadine (CLARITIN) 10 MG tablet; Take 1 tablet (10 mg total) by mouth daily. Going to treat her with permethrin to rule out scabies. Although it's not a classic presentation it is possible that this could be scabies. Chronic headaches  Other Orders - famotidine (PEPCID) 20 MG tablet; Take 1 tablet (20 mg total) by mouth 2 (two) times daily. - Cancel: clindamycin (CLEOCIN) 150 MG capsule; Take by mouth 3 (three) times daily.

## 2013-11-07 LAB — H. PYLORI ANTIBODY, IGA: HELICOBACTER PYLORI AB, IGA: 26 U/mL — ABNORMAL HIGH (ref <9.0)

## 2013-11-11 ENCOUNTER — Encounter: Payer: Self-pay | Admitting: Internal Medicine

## 2013-11-11 DIAGNOSIS — L509 Urticaria, unspecified: Secondary | ICD-10-CM | POA: Insufficient documentation

## 2013-11-11 NOTE — Assessment & Plan Note (Addendum)
Probably allergic, stress and heat related She has no rash to demonstrate today. We discussed usual triggers through a histamine mechanism and plan to try combination H1&H2 with Singulair. Plan-Allegra 180, Zantac, Singulair. She may need to use these on an episodic basis.labs for allergy profiles.a attention to use of laundry products with bacterial enzymes

## 2013-11-11 NOTE — Assessment & Plan Note (Signed)
She has used nasal sprays but can't name them. Says Benadryl caused nausea and Claritin made her sleepy, which would be unusual. Plan-try Allegra 180 mg, Singulair seasonally as needed

## 2013-11-12 ENCOUNTER — Other Ambulatory Visit: Payer: Self-pay | Admitting: Family Medicine

## 2013-11-12 ENCOUNTER — Other Ambulatory Visit: Payer: Self-pay | Admitting: Internal Medicine

## 2013-11-12 ENCOUNTER — Telehealth: Payer: Self-pay | Admitting: *Deleted

## 2013-11-12 DIAGNOSIS — K219 Gastro-esophageal reflux disease without esophagitis: Secondary | ICD-10-CM

## 2013-11-12 DIAGNOSIS — A048 Other specified bacterial intestinal infections: Secondary | ICD-10-CM

## 2013-11-12 MED ORDER — METRONIDAZOLE 500 MG PO TABS: 500.0000 mg | ORAL_TABLET | Freq: Two times a day (BID) | ORAL | Status: DC

## 2013-11-12 MED ORDER — CLARITHROMYCIN 500 MG PO TABS: 500.0000 mg | ORAL_TABLET | Freq: Two times a day (BID) | ORAL | Status: DC

## 2013-11-12 MED ORDER — OMEPRAZOLE 40 MG PO CPDR: DELAYED_RELEASE_CAPSULE | ORAL | Status: DC

## 2013-11-12 NOTE — Telephone Encounter (Signed)
Notified patient of lab results as followed.

## 2013-11-12 NOTE — Telephone Encounter (Signed)
Message copied by Kushal Saunders, [place] R on Wed Nov 12, 2013 12:27 PM ------      Message from: Doran Heater      Created: Wed Nov 12, 2013 10:57 AM       Please let pt know h pylori pos. I will send in medicine for it to the [facility]. Thanks AW ------

## 2013-11-12 NOTE — Telephone Encounter (Signed)
LMTCB- need to see when patient would like to come for teaching of EPI Pen and to also send RX.

## 2013-11-26 NOTE — Telephone Encounter (Signed)
Spoke with patient today-she has recently been in [place] and had to [facility] her appt (as well as CY out of office that day). Pt will [facility] and be shown how to use Epipen as well.

## 2013-11-27 ENCOUNTER — Ambulatory Visit: Payer: Medicaid Other | Admitting: Internal Medicine

## 2013-12-10 ENCOUNTER — Encounter: Payer: Self-pay | Admitting: Internal Medicine

## 2013-12-10 ENCOUNTER — Other Ambulatory Visit: Payer: Self-pay | Admitting: Family Medicine

## 2013-12-10 ENCOUNTER — Ambulatory Visit: Payer: Medicaid Other | Attending: Internal Medicine | Admitting: Internal Medicine

## 2013-12-10 VITALS — BP 118/80 | HR 80 | Temp 98.8°F | Resp 16 | Wt 151.2 lb

## 2013-12-10 DIAGNOSIS — G43909 Migraine, unspecified, not intractable, without status migrainosus: Secondary | ICD-10-CM | POA: Insufficient documentation

## 2013-12-10 DIAGNOSIS — J45909 Unspecified asthma, uncomplicated: Secondary | ICD-10-CM | POA: Insufficient documentation

## 2013-12-10 DIAGNOSIS — R21 Rash and other nonspecific skin eruption: Secondary | ICD-10-CM

## 2013-12-10 DIAGNOSIS — F17200 Nicotine dependence, unspecified, uncomplicated: Secondary | ICD-10-CM | POA: Insufficient documentation

## 2013-12-10 DIAGNOSIS — Z79899 Other long term (current) drug therapy: Secondary | ICD-10-CM | POA: Insufficient documentation

## 2013-12-10 DIAGNOSIS — H6690 Otitis media, unspecified, unspecified ear: Secondary | ICD-10-CM

## 2013-12-10 DIAGNOSIS — H9209 Otalgia, unspecified ear: Secondary | ICD-10-CM | POA: Insufficient documentation

## 2013-12-10 DIAGNOSIS — F411 Generalized anxiety disorder: Secondary | ICD-10-CM | POA: Insufficient documentation

## 2013-12-10 MED ORDER — CIPROFLOXACIN-DEXAMETHASONE 0.3-0.1 % OT SUSP: 4.0000 [drp] | Freq: Two times a day (BID) | OTIC | Status: DC

## 2013-12-10 MED ORDER — HYDROCORTISONE 2.5 % EX CREA: TOPICAL_CREAM | Freq: Two times a day (BID) | CUTANEOUS | Status: DC

## 2013-12-10 NOTE — Progress Notes (Signed)
Patient complains of pain to left ear Feels nausea at times Has patches of dry skin on her torso

## 2013-12-10 NOTE — Progress Notes (Signed)
MRN: [id number] Name: Katherine [name]  Sex: female Age: 43 y.o. DOB: 1971/05/25  Allergies: Morphine; Shellfish allergy; Codeine; Hydrocodone; Penicillins; and Tomato  Chief Complaint  Patient presents with  . Otalgia    HPI: Patient is 43 y.o. female who comes today reported to have left ear pain since last night, as per patient she used a few drops of peroxide , she denies any fever chills chest pain shortness of breath has minimal nasal congestion, she also has history of rash on the skin already been referred to allergy specialist, she has tried hydrocortisone 1% topical lotion as per patient is not helping much.  Past Medical History  Diagnosis Date  . Pneumonia     with cavitation of left lower lobe  . Asthma   . Migraine   . Hot flashes   . Palpitations   . Chest pressure   . Anxiety   . Fibromyalgia     Past Surgical History  Procedure Laterality Date  . Tubal ligation  1993      Medication List       This list is accurate as of: 12/10/13 10:11 AM.  Always use your most recent med list.               albuterol 108 (90 BASE) MCG/ACT inhaler  Commonly known as:  VENTOLIN HFA  Inhale 2 puffs into the lungs every 6 (six) hours as needed. For shortness of breath     busPIRone 15 MG tablet  Commonly known as:  BUSPAR  Take 1 tablet (15 mg total) by mouth 2 (two) times daily.     carbamazepine 100 MG chewable tablet  Commonly known as:  TEGRETOL  Chew 1 tablet (100 mg total) by mouth 2 (two) times daily.     cetirizine 10 MG tablet  Commonly known as:  ZYRTEC  Take 10 mg by mouth daily.     ciprofloxacin-dexamethasone otic suspension  Commonly known as:  CIPRODEX  Place 4 drops into the left ear 2 (two) times daily.     clarithromycin 500 MG tablet  Commonly known as:  BIAXIN  Take 1 tablet (500 mg total) by mouth 2 (two) times daily.     famotidine 20 MG tablet  Commonly known as:  PEPCID  Take 1 tablet (20 mg total) by mouth 2 (two) times daily.      hydrocortisone 2.5 % cream  Apply topically 2 (two) times daily.     hydrOXYzine 10 MG tablet  Commonly known as:  ATARAX/VISTARIL  Take 1 tablet (10 mg total) by mouth 3 (three) times daily as needed.     loratadine 10 MG tablet  Commonly known as:  CLARITIN  Take 1 tablet (10 mg total) by mouth daily.     metoprolol tartrate 25 MG tablet  Commonly known as:  LOPRESSOR  Take 0.5 tablets (12.5 mg total) by mouth 2 (two) times daily.     metroNIDAZOLE 500 MG tablet  Commonly known as:  FLAGYL  Take 1 tablet (500 mg total) by mouth 2 (two) times daily. 1 tab twice daily for 14 days     montelukast 10 MG tablet  Commonly known as:  SINGULAIR  Take 1 tablet (10 mg total) by mouth at bedtime.     olopatadine 0.1 % ophthalmic solution  Commonly known as:  PATANOL  Place 1 drop into the right eye 2 (two) times daily.     omeprazole 40 MG capsule  Commonly known  as:  PRILOSEC  2 times daily for 14 days     permethrin 5 % cream  Commonly known as:  ELIMITE  Apply 1 application topically once. Apply and rinse off after 8-12 hours. Repeat in 1 week.     predniSONE 10 MG tablet  Commonly known as:  DELTASONE  Take 10 mg by mouth daily with breakfast.     topiramate 25 MG tablet  Commonly known as:  TOPAMAX  Take 1 tablet (25 mg total) by mouth 2 (two) times daily.        Meds ordered this encounter  Medications  . ciprofloxacin-dexamethasone (CIPRODEX) otic suspension    Sig: Place 4 drops into the left ear 2 (two) times daily.    Dispense:  7.5 mL    Refill:  0  . hydrocortisone 2.5 % cream    Sig: Apply topically 2 (two) times daily.    Dispense:  30 g    Refill:  1    Immunization History  Administered Date(s) Administered  . Influenza Whole 07/23/2009, 07/04/2010  . Pneumococcal Polysaccharide-23 07/23/2009  . Td 03/30/2008    Family History  Problem Relation Age of Onset  . Migraines Daughter   . Migraines Son   . Hypertension    . Hypertension  Mother   . Hypertension Father   . Colon cancer Neg Hx   . Colon polyps Neg Hx   . Diabetes Neg Hx   . Heart disease Mother   . Heart disease Father   . Kidney disease Neg Hx   . Liver disease Neg Hx   . Breast cancer Sister 18    deceased at age 49    History  Substance Use Topics  . Smoking status: Current Some Day Smoker -- 0.05 packs/day    Types: Cigarettes  . Smokeless tobacco: Never Used     Comment: form given 01-20-13  . Alcohol Use: No    Review of Systems   As noted in HPI  Filed Vitals:   12/10/13 0955  BP: 118/80  Pulse: 80  Temp: 98.8 F (37.1 C)  Resp: 16    Physical Exam  Physical Exam  Constitutional: No distress.  HENT:  Left ear pain with pressure on the tragus, erythema noticed in the left auditory canal.  Eyes: EOM are normal. Pupils are equal, round, and reactive to light.  Cardiovascular: Normal rate and regular rhythm.   Pulmonary/Chest: Breath sounds normal. No respiratory distress. She has no wheezes. She has no rales.  Skin:  Rash on arms    CBC    Component Value Date/Time   WBC 7.9 09/05/2013 1459   RBC 4.70 09/05/2013 1459   HGB 12.5 09/05/2013 1459   HCT 37.1 09/05/2013 1459   PLT 217 09/05/2013 1459   MCV 78.9 09/05/2013 1459   LYMPHSABS 4.0 09/05/2013 1459   MONOABS 0.5 09/05/2013 1459   EOSABS 0.3 09/05/2013 1459   BASOSABS 0.0 09/05/2013 1459    CMP     Component Value Date/Time   NA 134* 09/05/2013 1459   K 4.2 09/05/2013 1459   CL 105 09/05/2013 1459   CO2 26 09/05/2013 1459   GLUCOSE 87 09/05/2013 1459   BUN 8 09/05/2013 1459   CREATININE 0.60 09/05/2013 1459   CREATININE 0.58 12/29/2012 1259   CALCIUM 9.2 09/05/2013 1459   PROT 7.2 09/05/2013 1459   ALBUMIN 4.5 09/05/2013 1459   AST 13 09/05/2013 1459   ALT 11 09/05/2013 1459   ALKPHOS 66  09/05/2013 1459   BILITOT 0.3 09/05/2013 1459   GFRNONAA >90 12/29/2012 1259   GFRAA >90 12/29/2012 1259    Lab Results  Component Value Date/Time   CHOL 145  09/05/2013  2:59 PM    No components found with this basename: hga1c    Lab Results  Component Value Date/Time   AST 13 09/05/2013  2:59 PM    Assessment and Plan  Otitis - Plan: ciprofloxacin-dexamethasone (CIPRODEX) otic suspension  Rash and nonspecific skin eruption - Plan: hydrocortisone 2.5 % cream  Patient will follow with allergy specialist.  Return if symptoms worsen or fail to improve.  Lorayne Marek, MD

## 2014-01-05 ENCOUNTER — Ambulatory Visit (INDEPENDENT_AMBULATORY_CARE_PROVIDER_SITE_OTHER): Payer: Medicaid Other | Admitting: Internal Medicine

## 2014-01-05 ENCOUNTER — Encounter: Payer: Self-pay | Admitting: Internal Medicine

## 2014-01-05 VITALS — BP 122/62 | HR 55 | Ht 65.0 in | Wt 152.0 lb

## 2014-01-05 DIAGNOSIS — I471 Supraventricular tachycardia: Secondary | ICD-10-CM

## 2014-01-05 DIAGNOSIS — I4719 Other supraventricular tachycardia: Secondary | ICD-10-CM

## 2014-01-05 NOTE — Patient Instructions (Signed)
Your physician recommends that you continue on your current medications as directed. Please refer to the Current Medication list given to you today.     

## 2014-01-05 NOTE — Progress Notes (Signed)
HPI Patient is a 43 yo with a history of palpitations and atrial tach on event monitor.  Also a hx of syncope. Echo normal. I saw her back in 2013  Prior to that she was seen by Einar Crow. Sinc seen she continues to have intermittent dizzy spells  No syncope  Drinking fluids.    Allergies  Allergen Reactions  . Morphine Anaphylaxis, Hives and Swelling  . Shellfish Allergy Anaphylaxis  . Codeine Hives  . Hydrocodone Hives  . Penicillins Other (See Comments)    Yeast infection  . Tomato Rash    Current Outpatient Prescriptions  Medication Sig Dispense Refill  . busPIRone (BUSPAR) 15 MG tablet Take 1 tablet (15 mg total) by mouth 2 (two) times daily.  30 tablet  1  . carbamazepine (TEGRETOL) 100 MG chewable tablet Chew 1 tablet (100 mg total) by mouth 2 (two) times daily.  60 tablet  2  . cetirizine (ZYRTEC) 10 MG tablet Take 10 mg by mouth daily.      . ciprofloxacin-dexamethasone (CIPRODEX) otic suspension Place 4 drops into the left ear 2 (two) times daily.  7.5 mL  0  . clarithromycin (BIAXIN) 500 MG tablet Take 1 tablet (500 mg total) by mouth 2 (two) times daily.  28 tablet  0  . famotidine (PEPCID) 20 MG tablet Take 1 tablet (20 mg total) by mouth 2 (two) times daily.  30 tablet  2  . hydrocortisone 2.5 % cream Apply topically 2 (two) times daily.  30 g  1  . hydrOXYzine (ATARAX/VISTARIL) 10 MG tablet Take 1 tablet (10 mg total) by mouth 3 (three) times daily as needed.  30 tablet  0  . loratadine (CLARITIN) 10 MG tablet Take 1 tablet (10 mg total) by mouth daily.  30 tablet  11  . metoprolol tartrate (LOPRESSOR) 25 MG tablet Take 0.5 tablets (12.5 mg total) by mouth 2 (two) times daily.  90 tablet  1  . metroNIDAZOLE (FLAGYL) 500 MG tablet Take 1 tablet (500 mg total) by mouth 2 (two) times daily. 1 tab twice daily for 14 days  28 tablet  0  . montelukast (SINGULAIR) 10 MG tablet Take 1 tablet (10 mg total) by mouth at bedtime.  30 tablet  11  . olopatadine (PATANOL) 0.1 % ophthalmic  solution Place 1 drop into the right eye 2 (two) times daily.  5 mL  12  . omeprazole (PRILOSEC) 40 MG capsule 2 times daily for 14 days  28 capsule  0  . permethrin (ELIMITE) 5 % cream Apply 1 application topically once. Apply and rinse off after 8-12 hours. Repeat in 1 week.  60 g  1  . predniSONE (DELTASONE) 10 MG tablet Take 10 mg by mouth daily with breakfast.      . topiramate (TOPAMAX) 25 MG tablet Take 1 tablet (25 mg total) by mouth 2 (two) times daily.  30 tablet  0  . albuterol (VENTOLIN HFA) 108 (90 BASE) MCG/ACT inhaler Inhale 2 puffs into the lungs every 6 (six) hours as needed. For shortness of breath  1 each  3  . [DISCONTINUED] diltiazem (CARDIZEM SR) 120 MG 12 hr capsule Take 1 capsule (120 mg total) by mouth daily.  30 capsule  6   No current facility-administered medications for this visit.    Past Medical History  Diagnosis Date  . Pneumonia     with cavitation of left lower lobe  . Asthma   . Migraine   . Hot flashes   .  Palpitations   . Chest pressure   . Anxiety   . Fibromyalgia     Past Surgical History  Procedure Laterality Date  . Tubal ligation  1993    Family History  Problem Relation Age of Onset  . Migraines Daughter   . Migraines Son   . Hypertension    . Hypertension Mother   . Hypertension Father   . Colon cancer Neg Hx   . Colon polyps Neg Hx   . Diabetes Neg Hx   . Heart disease Mother   . Heart disease Father   . Kidney disease Neg Hx   . Liver disease Neg Hx   . Breast cancer Sister 56    deceased at age 68    History   Social History  . Marital Status: Single    Spouse Name: N/A    Number of Children: N/A  . Years of Education: N/A   Occupational History  . Not on file.   Social History Main Topics  . Smoking status: Current Some Day Smoker -- 0.05 packs/day    Types: Cigarettes  . Smokeless tobacco: Never Used     Comment: form given 01-20-13  . Alcohol Use: No  . Drug Use: No     Comment: pt states she has d/c  marijuana  . Sexual Activity: Yes    Birth Control/ Protection: Surgical   Other Topics Concern  . Not on file   Social History Narrative   On disability for heart condition.  Pt. Was non-specific.     Review of Systems:  All systems reviewed.  They are negative to the above problem except as previously stated.  Vital Signs: BP 122/62  Pulse 55  Ht 5\' 5"  (1.651 m)  Wt 152 lb (68.947 kg)  BMI 25.29 kg/m2 Orthostatic :  Lying:  BP 120/72  P 64:  Sitting:  114/74  P 60; Standing:  BP 120/76  P 60;  5 min:  118/70  P60   Physical Exam Patient  In NAD HEENT:  Normocephalic, atraumatic. EOMI, PERRLA.  Neck: JVP is normal.  No bruits.  Lungs: clear to auscultation. No rales no wheezes.  Heart: Regular rate and rhythm. Normal S1, S2. No S3.   No significant murmurs. PMI not displaced.  Abdomen:  Supple, nontender. Normal bowel sounds. No masses. No hepatomegaly.  Extremities:   Good distal pulses throughout. No lower extremity edema.  Musculoskeletal :moving all extremities.  Neuro:   alert and oriented x3.  CN II-XII grossly intact.  EKG  SB 55 Assessment and Plan:  1.  Palpitations/ dizziness  Patient is still having intermitt spells  No syncope  Not orhtostatic on exam I would recomm she stay on current regimen  Increase salt intake Will f/u next fall.

## 2014-01-18 ENCOUNTER — Encounter (HOSPITAL_COMMUNITY): Payer: Self-pay | Admitting: Emergency Medicine

## 2014-01-18 ENCOUNTER — Emergency Department (HOSPITAL_COMMUNITY)
Admission: EM | Admit: 2014-01-18 | Discharge: 2014-01-18 | Disposition: A | Discharge status: Home / Self Care | Payer: Medicaid Other | Attending: Emergency Medicine | Admitting: Emergency Medicine

## 2014-01-18 DIAGNOSIS — R0789 Other chest pain: Secondary | ICD-10-CM | POA: Insufficient documentation

## 2014-01-18 DIAGNOSIS — Z8701 Personal history of pneumonia (recurrent): Secondary | ICD-10-CM | POA: Insufficient documentation

## 2014-01-18 DIAGNOSIS — G43109 Migraine with aura, not intractable, without status migrainosus: Secondary | ICD-10-CM | POA: Insufficient documentation

## 2014-01-18 DIAGNOSIS — Z880 Allergy status to penicillin: Secondary | ICD-10-CM | POA: Insufficient documentation

## 2014-01-18 DIAGNOSIS — Z885 Allergy status to narcotic agent status: Secondary | ICD-10-CM | POA: Insufficient documentation

## 2014-01-18 DIAGNOSIS — R002 Palpitations: Secondary | ICD-10-CM | POA: Insufficient documentation

## 2014-01-18 DIAGNOSIS — Z79899 Other long term (current) drug therapy: Secondary | ICD-10-CM | POA: Insufficient documentation

## 2014-01-18 DIAGNOSIS — IMO0001 Reserved for inherently not codable concepts without codable children: Secondary | ICD-10-CM | POA: Insufficient documentation

## 2014-01-18 DIAGNOSIS — N951 Menopausal and female climacteric states: Secondary | ICD-10-CM | POA: Insufficient documentation

## 2014-01-18 DIAGNOSIS — F17200 Nicotine dependence, unspecified, uncomplicated: Secondary | ICD-10-CM | POA: Insufficient documentation

## 2014-01-18 DIAGNOSIS — R21 Rash and other nonspecific skin eruption: Secondary | ICD-10-CM | POA: Insufficient documentation

## 2014-01-18 DIAGNOSIS — J45909 Unspecified asthma, uncomplicated: Secondary | ICD-10-CM | POA: Insufficient documentation

## 2014-01-18 MED ORDER — DIPHENHYDRAMINE HCL 25 MG PO CAPS
25.0000 mg | ORAL_CAPSULE | Freq: Once | ORAL | Status: AC
  Administered 2014-01-18: 25 mg via ORAL
  Filled 2014-01-18: qty 1

## 2014-01-18 MED ORDER — TRIAMCINOLONE ACETONIDE 0.1 % EX CREA: 1.0000 "application " | TOPICAL_CREAM | Freq: Two times a day (BID) | CUTANEOUS | Status: DC

## 2014-01-18 MED ORDER — PREDNISONE 20 MG PO TABS
40.0000 mg | ORAL_TABLET | Freq: Once | ORAL | Status: AC
  Administered 2014-01-18: 40 mg via ORAL
  Filled 2014-01-18: qty 2

## 2014-01-18 NOTE — ED Provider Notes (Signed)
CSN: [id number]     Arrival date & time 01/18/14  1052 History  This chart was scribed for non-physician practitioner, Vernie Murders, PA-C working with No att. providers found by Frederich Balding, ED scribe. This patient was seen in room TR05C/TR05C and the patient's care was started at 11:18 AM.   Chief Complaint  Patient presents with  . Rash   The history is provided by the patient. No language interpreter was used.   HPI Comments: Katherine [name] is a 43 y.o. female who presents to the Emergency Department complaining of a rash. Rash has been constant for the past 2 months. Rash is concentrated on her chest, back and arms bilaterally, but is also scattered throughout. Rash is itchy. Not painful. No open wounds, sores, drainage, spreading redness/swelling. Patient states she has been seen by her PCP, allergist, and dermatologist for this. She was treated for scabies with no relief. She states "they have ruled out scabies." She also has tried calamine lotion and Benadryl which seems to improve her rash temporarily. Hydrocortisone, Atarax and zyrtec provides no relief. She is worried that this started when she went to a new hairstylist for extensions. She states that she knows of someone else who had a similar reaction because of this. She believes this also could be from bedbugs since they are "going around her apartment complex." She denies anyone in close contact with a similar rash. States this "may be similar to my eczema." No new lotions, detergents, or soaps. No fever, chills, rhinorrhea, congestion, oral sores, emesis, nausea, or other concerns.    Past Medical History  Diagnosis Date  . Pneumonia     with cavitation of left lower lobe  . Asthma   . Migraine   . Hot flashes   . Palpitations   . Chest pressure   . Anxiety   . Fibromyalgia    Past Surgical History  Procedure Laterality Date  . Tubal ligation  1993   Family History  Problem Relation Age of Onset  . Migraines Daughter    . Migraines Son   . Hypertension    . Hypertension Mother   . Hypertension Father   . Colon cancer Neg Hx   . Colon polyps Neg Hx   . Diabetes Neg Hx   . Heart disease Mother   . Heart disease Father   . Kidney disease Neg Hx   . Liver disease Neg Hx   . Breast cancer Sister 74    deceased at age 66   History  Substance Use Topics  . Smoking status: Current Some Day Smoker -- 0.05 packs/day    Types: Cigarettes  . Smokeless tobacco: Never Used     Comment: form given 01-20-13  . Alcohol Use: No   OB History   Grav Para Term Preterm Abortions TAB SAB Ect Mult Living   2 2 1 1      2      Review of Systems  Constitutional: Negative for fever, chills, activity change, appetite change and fatigue.  HENT: Negative for congestion, facial swelling, mouth sores, rhinorrhea, sinus pressure and sore throat.   Eyes: Negative for itching.  Respiratory: Negative for cough and wheezing.   Gastrointestinal: Negative for nausea, vomiting, abdominal pain and diarrhea.  Musculoskeletal: Negative for arthralgias, back pain and myalgias.  Skin: Positive for rash. Negative for color change and wound.  Neurological: Negative for dizziness, weakness, light-headedness and headaches.  All other systems reviewed and are negative.  Allergies  Morphine;  Shellfish allergy; Codeine; Hydrocodone; Penicillins; and Tomato  Home Medications   Prior to Admission medications   Medication Sig Start Date End Date Taking? Authorizing Provider  albuterol (VENTOLIN HFA) 108 (90 BASE) MCG/ACT inhaler Inhale 2 puffs into the lungs every 6 (six) hours as needed. For shortness of breath 03/31/13   Debbe Odea, MD  busPIRone (BUSPAR) 15 MG tablet Take 1 tablet (15 mg total) by mouth 2 (two) times daily. 11/06/13 11/06/14  Doran Heater, MD  carbamazepine (TEGRETOL) 100 MG chewable tablet Chew 1 tablet (100 mg total) by mouth 2 (two) times daily. 09/05/13   Reyne Dumas, MD  cetirizine (ZYRTEC) 10 MG tablet Take 10  mg by mouth daily.    Historical Provider, MD  ciprofloxacin-dexamethasone (CIPRODEX) otic suspension Place 4 drops into the left ear 2 (two) times daily. 12/10/13   Lorayne Marek, MD  clarithromycin (BIAXIN) 500 MG tablet Take 1 tablet (500 mg total) by mouth 2 (two) times daily. 11/12/13   Doran Heater, MD  famotidine (PEPCID) 20 MG tablet Take 1 tablet (20 mg total) by mouth 2 (two) times daily. 11/06/13   Doran Heater, MD  hydrocortisone 2.5 % cream Apply topically 2 (two) times daily. 12/10/13   Lorayne Marek, MD  hydrOXYzine (ATARAX/VISTARIL) 10 MG tablet Take 1 tablet (10 mg total) by mouth 3 (three) times daily as needed. 09/05/13   Reyne Dumas, MD  loratadine (CLARITIN) 10 MG tablet Take 1 tablet (10 mg total) by mouth daily. 11/06/13   Doran Heater, MD  metoprolol tartrate (LOPRESSOR) 25 MG tablet Take 0.5 tablets (12.5 mg total) by mouth 2 (two) times daily. 11/06/13   Doran Heater, MD  metroNIDAZOLE (FLAGYL) 500 MG tablet Take 1 tablet (500 mg total) by mouth 2 (two) times daily. 1 tab twice daily for 14 days 11/12/13   Doran Heater, MD  montelukast (SINGULAIR) 10 MG tablet Take 1 tablet (10 mg total) by mouth at bedtime. 10/15/13   Deneise Lever, MD  olopatadine (PATANOL) 0.1 % ophthalmic solution Place 1 drop into the right eye 2 (two) times daily. 03/31/13   Debbe Odea, MD  omeprazole (PRILOSEC) 40 MG capsule 2 times daily for 14 days 11/12/13   Doran Heater, MD  permethrin (ELIMITE) 5 % cream Apply 1 application topically once. Apply and rinse off after 8-12 hours. Repeat in 1 week. 11/06/13   Doran Heater, MD  predniSONE (DELTASONE) 10 MG tablet Take 10 mg by mouth daily with breakfast.    Historical Provider, MD  topiramate (TOPAMAX) 25 MG tablet Take 1 tablet (25 mg total) by mouth 2 (two) times daily. 11/06/13   Doran Heater, MD   BP 113/62  Pulse 62  Temp(Src) 99.5 F (37.5 C) (Oral)  Resp 20  Wt 151 lb 4 oz (68.607 kg)  SpO2 98%  Filed Vitals:   01/18/14 1104  01/18/14 1105 01/18/14 1333  BP:  113/62 113/77  Pulse:  62 57  Temp:  99.5 F (37.5 C)   TempSrc: Oral Oral   Resp: 20 20   Weight: 151 lb 4 oz (68.607 kg)    SpO2:  98% 100%    Physical Exam  Nursing note and vitals reviewed. Constitutional: She is oriented to person, place, and time. She appears well-developed and well-nourished. No distress.  Non-toxic  HENT:  Head: Normocephalic and atraumatic.  Right Ear: External ear normal.  Left Ear: External ear normal.  Nose: Nose normal.  Mouth/Throat: Oropharynx  is clear and moist. No oropharyngeal exudate.  No oral lesions. Tympanic membranes gray and translucent bilaterally with no erythema, edema, or hemotympanum.  No mastoid or tragal tenderness bilaterally. No erythema to the posterior pharynx. Tonsils without edema or exudates. Uvula midline. No trismus. No difficulty controlling secretions. No rash or nits seen to the scalp throughout.   Eyes: Conjunctivae and EOM are normal. Pupils are equal, round, and reactive to light. Right eye exhibits no discharge. Left eye exhibits no discharge.  Neck: Neck supple. No tracheal deviation present.  No cervical lymphadenopathy. No nuchal rigidity.  Cardiovascular: Normal rate.   Pulmonary/Chest: Effort normal. No respiratory distress.  Abdominal: Soft.  Musculoskeletal: Normal range of motion. She exhibits no edema and no tenderness.  Neurological: She is alert and oriented to person, place, and time.  Skin: Skin is warm and dry. She is not diaphoretic.  Diffuse macular-papular hyperpigmented circular <1 cm rash in various sizes to the chest, back and arms bilaterally. No open wounds, edema, or erythema. No rash between the web-spaces of the digits bilaterally.   Psychiatric: She has a normal mood and affect. Her behavior is normal.    ED Course  Procedures (including critical care time)  DIAGNOSTIC STUDIES: Oxygen Saturation is 98% on RA, normal by my interpretation.    Labs  Review Labs Reviewed - No data to display  Imaging Review No results found.   EKG Interpretation None      MDM   Derrian Rodak is a 43 y.o. female who presents to the Emergency Department complaining of a rash. Etiology of rash unclear. Possibly due to bedbugs vs contact dermatitis. Will treat with a Kenalog as the patient has already tried OTC hydrocortisone cream with no relief. Given one dose of prednisone and benadryl in the ED. Less likely scabies since the patient has been treated for this in the past. Patient afebrile and non-toxic in appearance. No evidence of a cellulitis, abscess or other infectious process. No signs or symptoms of anaphylaxis. Patient given referral to dermatology. Return precautions, discharge instructions, and follow-up was discussed with the patient before discharge.      Discharge Medication List as of 01/18/2014  1:27 PM    START taking these medications   Details  triamcinolone cream (KENALOG) 0.1 % Apply 1 application topically 2 (two) times daily., Starting 01/18/2014, Until Discontinued, Print         Final impressions: 1. Rash       Harold Hedge Shamra Bradeen PA-C    Lucila Maine, PA-C 01/20/14 1050

## 2014-01-18 NOTE — Discharge Instructions (Signed)
Apply cream to affected area  Take Benadryl for itching  Follow-up with dermatology  Return to the emergency department if you develop any changing/worsening condition, spreading redness/swelling, fever, or any other concerns (please read additional information regarding your condition below)    Rash A rash is a change in the color or texture of your skin. There are many different types of rashes. You may have other problems that accompany your rash. CAUSES   Infections.  Allergic reactions. This can include allergies to pets or foods.  Certain medicines.  Exposure to certain chemicals, soaps, or cosmetics.  Heat.  Exposure to poisonous plants.  Tumors, both cancerous and noncancerous. SYMPTOMS   Redness.  Scaly skin.  Itchy skin.  Dry or cracked skin.  Bumps.  Blisters.  Pain. DIAGNOSIS  Your caregiver may do a physical exam to determine what type of rash you have. A skin sample (biopsy) may be taken and examined under a microscope. TREATMENT  Treatment depends on the type of rash you have. Your caregiver may prescribe certain medicines. For serious conditions, you may need to see a skin doctor (dermatologist). HOME CARE INSTRUCTIONS   Avoid the substance that caused your rash.  Do not scratch your rash. This can cause infection.  You may take cool baths to help stop itching.  Only take over-the-counter or prescription medicines as directed by your caregiver.  Keep all follow-up appointments as directed by your caregiver. SEEK IMMEDIATE MEDICAL CARE IF:  You have increasing pain, swelling, or redness.  You have a fever.  You have new or severe symptoms.  You have body aches, diarrhea, or vomiting.  Your rash is not better after 3 days. MAKE SURE YOU:  Understand these instructions.  Will watch your condition.  Will get help right away if you are not doing well or get worse. Document Released: 09/01/2002 Document Revised: 12/04/2011 Document  Reviewed: 06/26/2011 [facility] Patient Information 2014 [place], [place].   Emergency Department Resource Guide 1) Find a Doctor and Pay Out of Pocket Although you won't have to find out who is covered by your insurance plan, it is a good idea to ask around and get recommendations. You will then need to call the office and see if the doctor you have chosen will accept you as a new patient and what types of options they offer for patients who are self-pay. Some doctors offer discounts or will set up payment plans for their patients who do not have insurance, but you will need to ask so you aren't surprised when you get to your appointment.  2) Contact Your Local Health Department Not all health departments have doctors that can see patients for sick visits, but many do, so it is worth a call to see if yours does. If you don't know where your local health department is, you can check in your phone book. The CDC also has a tool to help you locate your state's health department, and many state websites also have listings of all of their local health departments.  3) Find a [facility] If your illness is not likely to be very severe or complicated, you may want to try a walk in clinic. These are popping up all over the country in pharmacies, drugstores, and shopping centers. They're usually staffed by nurse practitioners or physician assistants that have been trained to treat common illnesses and complaints. They're usually fairly quick and inexpensive. However, if you have serious medical issues or chronic medical problems, these are probably not  your best option.  No Primary Care Doctor: - Call Health Connect at  [phone] - they can help you locate a primary care doctor that  accepts your insurance, provides certain services, etc. - Physician Referral Service- [phone]  Chronic Pain Problems: Organization         Address  Phone   Notes  [facility]  [phone] Patients  need to be referred by their primary care doctor.   Medication Assistance: Organization         Address  Phone   Notes  [facility] [address] [address] [place], [place] [address] [phone] --Must be a resident of [facility] -- Must have NO insurance coverage whatsoever (no Medicaid/ Medicare, etc.) -- The pt. MUST have a primary care doctor that directs their care regularly and follows them in the community   [facility]ist  [phone]   [employer]  [phone]    Agencies that provide inexpensive medical care: Organization         Address  Phone   Notes  [place]  [phone]   [facility]    [phone]   [facility] [place], [place] [address] [phone]   [place] [address] [place] [phone]   Planned Parenthood    [phone]   [facility]    [phone]   [place] and [place] [address], [place] Phone:  [phone], Fax:  [phone] Hours of Operation:  9 am - 6 pm, M-F.  Also accepts Medicaid/Medicare and self-pay.  [facility] for [place], [address], [place] Phone: [phone], Fax: [phone]. Hours of Operation:  8:30 am - 5:30 pm, M-F.  Also accepts Medicaid and self-pay.  [facility] [place] [address], [place] Phone: [phone]   [place], [place], [place] [phone], Ext. [phone] Mondays & Thursdays: 7-9 AM.  First 15 patients are seen on a first come, first serve basis.    [place] Providers:  Organization         Address  Phone   Notes  [facility] [address], [place] [phone] Also accepts self-pay patients.  [facility] [address], [place]  [phone]     [place], [address], [place] [phone]   [facility] [address] [place] [phone]   Lucianne Lei [address], [place]   [phone] Only accepts [place] Access [place] patients after they have their name applied to their card.   Self-Pay (no insurance) in [facility]:  Organization         Address  Phone   Notes  Sickle Cell Patients, [facility] [place] [phone]   [facility] [place] [phone]   [facility] [place]  [place], [place], [place] [phone]   [facility]/Dr. Osei-Bonsu  [address] [place] or [address], [place] [phone] Phone number for both [place] and [place] locations is the same.  [facility] [address], [place] [phone]   [facility] [address] [place] [address], [place] or [address] [phone]   [facility] [address], [place] [phone], phone; [phone], fax Sees patients 1st and 3rd Saturday of every month.  Must not qualify for public or private insurance (i.e. Medicaid, Medicare, [employer] Health Choice, Veterans' Benefits)  Household income should be no more than 200% of the poverty level The clinic cannot treat you if you are pregnant or think you are pregnant  Sexually transmitted diseases are not treated at the clinic.    Dental Care: Organization         Address  Phone  Notes  [facility] Department of [facility] [phone] Accepts children up to age 48 who are enrolled in [place] or [place]; pregnant women with a Medicaid card; and children who have applied for Medicaid or [place] Health Choice, but were declined,  whose parents can pay a reduced fee at time of service.  [facility] Department of [facility]  [address], [place] [phone] Accepts children up to age 36 who are enrolled in [place] or [place]; pregnant women with a Medicaid card; and children who have applied for Medicaid or [place] Health Choice, but were declined, whose parents can pay a reduced fee at time of service.  [place] Adult Dental Access PROGRAM  [place] [phone] Patients are seen by appointment only. Walk-ins are not accepted. [place] will see patients 44 years of age and older. Monday - Tuesday (8am-5pm) Most Wednesdays (8:30-5pm) $30 per visit, cash only  [facility] Adult Dental Access PROGRAM  [address], [facility] [phone] Patients are seen by appointment only. Walk-ins are not accepted. [place] will see patients 98 years of age and older. One Wednesday Evening (Monthly: Volunteer Based).  $30 per visit, cash only  [place]  [phone] for adults; Children under age 25, call Graduate Pediatric Dentistry at [phone]. Children aged 47-14, please call [phone] to request a pediatric application.  Dental services are provided in all areas of dental care including fillings, crowns and bridges, complete and partial dentures, implants, gum treatment, root canals, and extractions. Preventive care is also provided. Treatment is provided to both adults and children. Patients are selected via a lottery and there is often a waiting list.   [facility] [address] [place]  [phone] [email or web address]   [facility] [address] [place], [place] [phone], Ext. [phone] Second and Fourth Thursday of each month, opens at 6:30 AM; Clinic ends at 9 AM.  Patients are seen on a first-come first-served basis, and a limited number are seen during each clinic.   [facility]  [address] Hillard Danker [place], [place] [phone]   Eligibility Requirements You must have lived in [place], [place], or [place] counties for at least the last three months.   You cannot be eligible for state or federal sponsored [employer], including [employer], [place], or [employer].   You generally cannot be eligible for healthcare insurance through your employer.    How to apply: Eligibility screenings are held every Tuesday and Wednesday afternoon from 1:00 pm until 4:00 pm. You do not need an appointment for the interview!  [place]  [place], [place], [place]   [place]  [place] Department  [place]  [phone]    Behavioral Health Resources in the Community: Intensive Outpatient Programs Organization         Address  Phone  Notes  [place]. [address], [place], [place] [phone]   [facility] Outpatient [address], [place], [place]   ADS: Alcohol & Drug Svcs [address], [place], [place]   [place] [address],  [place], [place] or [phone]   Substance Abuse Resources Organization         Address  Phone  Notes  Alcohol and Drug Services  [phone]   [place]  [phone]   The [place]   Chinita Pester  [phone]   Residential & Outpatient Substance Abuse Program  [phone]   Psychological Services Organization         Address  Phone  Notes  [facility] [place]  [place]  [phone]   [place] [address] [place] or [phone]    Mobile Crisis Teams Organization         Address  Phone  Notes  [facility], Mobile Crisis Care Unit   [phone]   [facility]ive Psychotherapeutic Services  [address] [place], [place]   [place] [address] [place] [phone]    Self-Help/Support Groups Organization         Address  Phone             Notes  [place]. of [place] - variety of support groups  [place] Call for more information  Narcotics Anonymous (NA), [facility]aring [facility] [address], [employer] [place]  2 meetings at this location   Special educational needs teacher         Address  Phone  Notes  ASAP Residential Treatment [place],    [place]  [phone]   [facility]  [address], [place] [address]67672, [place], [place]   [place], [place] [phone] Admissions: 8am-3pm M-F  Incentives Substance [facility] [place] [address]    [place], [place] [phone]   [facility] [address] [place], [place], [place]   [facility] [address],  [place], [place]   [facility] Programs - Intensive Outpatient [address], [place], [place]   [facility] ([place].,  [place], [place] [phone] or [phone]   [facility] Services (RTS) [address], [place], [place] Accepts Medicaid  [facility] [place] [address],  [place] [phone] Substance Abuse/Addiction Treatment   [facility] Organization         Address  Phone  Notes  [facility]  [phone]   Domenic Schwab, PhD [address] Arlis [place], [place]   [phone] or [phone]   [place], [place] [phone]   [place] [address] [place], [place] [phone] Insurance/Medicaid/sponsorship through [employer] and Families [address]  [place], [place] [phone] [place], [place] [phone]    Dr. Adele Schilder  [phone]   [facility] [place] Dept. 1) [address], [place] 2) [place] 3)  [place] [address], [place] [phone]  [phone]   [place] [phone] or [phone] (After Hours)

## 2014-01-18 NOTE — ED Notes (Signed)
Onset 1 or 2 months arms, trunk and face.  Pt has been treated for lice and scabies with no relief. Red raised bumps, itching.  Last used calamine lotion last Thursday.  Hasn't used Benadryl in about a month.  Has used other meds prescribed but does not remember name.

## 2014-01-18 NOTE — ED Notes (Signed)
She has an itchy rash all over her body for a month. No one in household with same rash. She was already treated by doctor for scabies and lice with no relief.

## 2014-01-18 NOTE — ED Notes (Signed)
Clarify meds used: Pt is not using any meds or creams for itching.  Used Claritin, hydrocortisone 1% and 2.5% creams, Benadryl with no relief, so pt stopped using.

## 2014-01-20 NOTE — ED Provider Notes (Signed)
Medical screening examination/treatment/procedure(s) were performed by non-physician practitioner and as supervising physician I was immediately available for consultation/collaboration.     Veryl Speak, MD 01/20/14 [phone]

## 2014-02-03 ENCOUNTER — Ambulatory Visit: Payer: Medicaid Other | Attending: Internal Medicine | Admitting: Internal Medicine

## 2014-02-03 ENCOUNTER — Encounter: Payer: Self-pay | Admitting: Internal Medicine

## 2014-02-03 VITALS — BP 115/77 | HR 69 | Temp 98.7°F | Resp 16 | Wt 151.0 lb

## 2014-02-03 DIAGNOSIS — L918 Other hypertrophic disorders of the skin: Secondary | ICD-10-CM

## 2014-02-03 DIAGNOSIS — L299 Pruritus, unspecified: Secondary | ICD-10-CM

## 2014-02-03 DIAGNOSIS — L919 Hypertrophic disorder of the skin, unspecified: Secondary | ICD-10-CM

## 2014-02-03 DIAGNOSIS — R21 Rash and other nonspecific skin eruption: Secondary | ICD-10-CM

## 2014-02-03 DIAGNOSIS — L909 Atrophic disorder of skin, unspecified: Secondary | ICD-10-CM

## 2014-02-03 MED ORDER — TRIAMCINOLONE ACETONIDE 0.1 % EX CREA: 1.0000 "application " | TOPICAL_CREAM | Freq: Two times a day (BID) | CUTANEOUS | Status: DC

## 2014-02-03 MED ORDER — METHYLPREDNISOLONE 4 MG PO KIT: PACK | ORAL | Status: DC

## 2014-02-03 MED ORDER — HYDROXYZINE HCL 10 MG PO TABS: 10.0000 mg | ORAL_TABLET | Freq: Three times a day (TID) | ORAL | Status: DC | PRN

## 2014-02-03 NOTE — Progress Notes (Signed)
MRN: [id number] Name: Katherine [name]  Sex: female Age: 43 y.o. DOB: [birth year]  Allergies: Morphine; Shellfish allergy; Codeine; Hydrocodone; Penicillins; and Tomato  Chief Complaint  Patient presents with  . Follow-up    HPI: Patient is 43 y.o. female who comes today reported to have a skin tag in her right thigh which is bothering her recently denies any fever chills any discharge, she also has history of rash, she went to the ER and was prescribed triamcinolone which she ran out and is requesting refill on that medication, also has lot of itching and is requesting refill for anti allergy medication, denies any chest pain or shortness of breath.  Past Medical History  Diagnosis Date  . Pneumonia     with cavitation of left lower lobe  . Asthma   . Migraine   . Hot flashes   . Palpitations   . Chest pressure   . Anxiety   . Fibromyalgia     Past Surgical History  Procedure Laterality Date  . Tubal ligation  1993      Medication List       This list is accurate as of: 02/03/14  3:26 PM.  Always use your most recent med list.               albuterol 108 (90 BASE) MCG/ACT inhaler  Commonly known as:  VENTOLIN HFA  Inhale 2 puffs into the lungs every 6 (six) hours as needed. For shortness of breath     busPIRone 15 MG tablet  Commonly known as:  BUSPAR  Take 1 tablet (15 mg total) by mouth 2 (two) times daily.     carbamazepine 100 MG chewable tablet  Commonly known as:  TEGRETOL  Chew 1 tablet (100 mg total) by mouth 2 (two) times daily.     cetirizine 10 MG tablet  Commonly known as:  ZYRTEC  Take 10 mg by mouth daily.     ciprofloxacin-dexamethasone otic suspension  Commonly known as:  CIPRODEX  Place 4 drops into the left ear 2 (two) times daily.     clarithromycin 500 MG tablet  Commonly known as:  BIAXIN  Take 1 tablet (500 mg total) by mouth 2 (two) times daily.     famotidine 20 MG tablet  Commonly known as:  PEPCID  Take 1 tablet (20  mg total) by mouth 2 (two) times daily.     hydrocortisone 2.5 % cream  Apply topically 2 (two) times daily.     hydrOXYzine 10 MG tablet  Commonly known as:  ATARAX/VISTARIL  Take 1 tablet (10 mg total) by mouth 3 (three) times daily as needed.     loratadine 10 MG tablet  Commonly known as:  CLARITIN  Take 1 tablet (10 mg total) by mouth daily.     methylPREDNISolone 4 MG tablet  Commonly known as:  MEDROL DOSEPAK  follow package directions     metoprolol tartrate 25 MG tablet  Commonly known as:  LOPRESSOR  Take 0.5 tablets (12.5 mg total) by mouth 2 (two) times daily.     metroNIDAZOLE 500 MG tablet  Commonly known as:  FLAGYL  Take 1 tablet (500 mg total) by mouth 2 (two) times daily. 1 tab twice daily for 14 days     montelukast 10 MG tablet  Commonly known as:  SINGULAIR  Take 1 tablet (10 mg total) by mouth at bedtime.     olopatadine 0.1 % ophthalmic solution  Commonly known  as:  PATANOL  Place 1 drop into the right eye 2 (two) times daily.     omeprazole 40 MG capsule  Commonly known as:  PRILOSEC  2 times daily for 14 days     permethrin 5 % cream  Commonly known as:  ELIMITE  Apply 1 application topically once. Apply and rinse off after 8-12 hours. Repeat in 1 week.     predniSONE 10 MG tablet  Commonly known as:  DELTASONE  Take 10 mg by mouth daily with breakfast.     topiramate 25 MG tablet  Commonly known as:  TOPAMAX  Take 1 tablet (25 mg total) by mouth 2 (two) times daily.     triamcinolone cream 0.1 %  Commonly known as:  KENALOG  Apply 1 application topically 2 (two) times daily.        Meds ordered this encounter  Medications  . methylPREDNISolone (MEDROL DOSEPAK) 4 MG tablet    Sig: follow package directions    Dispense:  21 tablet    Refill:  0  . hydrOXYzine (ATARAX/VISTARIL) 10 MG tablet    Sig: Take 1 tablet (10 mg total) by mouth 3 (three) times daily as needed.    Dispense:  30 tablet    Refill:  0  . triamcinolone cream  (KENALOG) 0.1 %    Sig: Apply 1 application topically 2 (two) times daily.    Dispense:  30 g    Refill:  0    Order Specific Question:  Supervising Provider    Answer:  Noemi Chapel D [[id number]]    Immunization History  Administered Date(s) Administered  . Influenza Whole 07/23/2009, 07/04/2010  . Pneumococcal Polysaccharide-23 07/23/2009  . Td 03/30/2008    Family History  Problem Relation Age of Onset  . Migraines Daughter   . Migraines Son   . Hypertension    . Hypertension Mother   . Hypertension Father   . Colon cancer Neg Hx   . Colon polyps Neg Hx   . Diabetes Neg Hx   . Heart disease Mother   . Heart disease Father   . Kidney disease Neg Hx   . Liver disease Neg Hx   . Breast cancer Sister 87    deceased at age 33    History  Substance Use Topics  . Smoking status: Current Some Day Smoker -- 0.05 packs/day    Types: Cigarettes  . Smokeless tobacco: Never Used     Comment: form given 01-20-13  . Alcohol Use: No    Review of Systems   As noted in HPI  Filed Vitals:   02/03/14 1457  BP: 115/77  Pulse: 69  Temp: 98.7 F (37.1 C)  Resp: 16    Physical Exam  Physical Exam  Eyes: EOM are normal. Pupils are equal, round, and reactive to light.  Cardiovascular: Normal rate and regular rhythm.   Pulmonary/Chest: Breath sounds normal. No respiratory distress. She has no wheezes. She has no rales.  Musculoskeletal: She exhibits no edema.  Skin:  Macular rash on the chest   Skin tag on inner side of thigh     CBC    Component Value Date/Time   WBC 7.9 09/05/2013 1459   RBC 4.70 09/05/2013 1459   HGB 12.5 09/05/2013 1459   HCT 37.1 09/05/2013 1459   PLT 217 09/05/2013 1459   MCV 78.9 09/05/2013 1459   LYMPHSABS 4.0 09/05/2013 1459   MONOABS 0.5 09/05/2013 1459   EOSABS 0.3 09/05/2013 1459  BASOSABS 0.0 09/05/2013 1459    CMP     Component Value Date/Time   NA 134* 09/05/2013 1459   K 4.2 09/05/2013 1459   CL 105 09/05/2013 1459   CO2  26 09/05/2013 1459   GLUCOSE 87 09/05/2013 1459   BUN 8 09/05/2013 1459   CREATININE 0.60 09/05/2013 1459   CREATININE 0.58 12/29/2012 1259   CALCIUM 9.2 09/05/2013 1459   PROT 7.2 09/05/2013 1459   ALBUMIN 4.5 09/05/2013 1459   AST 13 09/05/2013 1459   ALT 11 09/05/2013 1459   ALKPHOS 66 09/05/2013 1459   BILITOT 0.3 09/05/2013 1459   GFRNONAA >89 09/05/2013 1459   GFRNONAA >90 12/29/2012 1259   GFRAA >89 09/05/2013 1459   GFRAA >90 12/29/2012 1259    Lab Results  Component Value Date/Time   CHOL 145 09/05/2013  2:59 PM    No components found with this basename: hga1c    Lab Results  Component Value Date/Time   AST 13 09/05/2013  2:59 PM    Assessment and Plan  Rash and nonspecific skin eruption - Plan: methylPREDNISolone (MEDROL DOSEPAK) 4 MG tablet, Ambulatory referral to Dermatology, triamcinolone cream (KENALOG) 0.1 %  Itching - Plan: hydrOXYzine (ATARAX/VISTARIL) 10 MG tablet  Skin tag - Plan: Ambulatory referral to Dermatology  Return if symptoms worsen or fail to improve.  Lorayne Marek, MD

## 2014-02-03 NOTE — Progress Notes (Signed)
Patient here for a follow up on her rash on her chest Also would like  A new mole to her right thigh  looked at

## 2014-03-03 ENCOUNTER — Encounter: Payer: Self-pay | Admitting: *Deleted

## 2014-03-03 NOTE — Progress Notes (Unsigned)
Patient walked in stating she needs a note/letter stating that she is disabled for at least 3 years. This letter can be faxed to [employer] (loan processor at [place] phone [phone] ext [phone] or fax [phone].

## 2014-03-18 ENCOUNTER — Telehealth: Payer: Self-pay | Admitting: Internal Medicine

## 2014-03-18 NOTE — Telephone Encounter (Signed)
Pt has come in today to request a form for disability; pt has a son who has autism and would like to be contacted by a nurse so she can discuss the details of her need; please f/yu with pt at [phone]

## 2014-03-18 NOTE — Telephone Encounter (Signed)
Patient walked in stating she needs a note/letter stating that she is disabled for at least 3 years. Pt also needs a letter on behalf of her son who has autism; his name is Venora Maples Robbs. This letter can be faxed to [employer] (loan processor at [place] phone [phone] ext [phone] or fax [phone]. Ms. Cadena can be reached at [phone]

## 2014-03-23 ENCOUNTER — Ambulatory Visit: Payer: Medicaid Other

## 2014-03-23 ENCOUNTER — Encounter: Payer: Self-pay | Admitting: *Deleted

## 2014-03-23 VITALS — BP 117/84 | HR 64 | Temp 98.5°F | Resp 16 | Ht 66.0 in | Wt 151.6 lb

## 2014-03-23 LAB — CBC WITH DIFFERENTIAL/PLATELET
Basophils Absolute: 0 10*3/uL (ref 0.0–0.1)
Basophils Relative: 0 % (ref 0–1)
EOS ABS: 0.2 10*3/uL (ref 0.0–0.7)
Eosinophils Relative: 2 % (ref 0–5)
HCT: 38.6 % (ref 36.0–46.0)
HEMOGLOBIN: 13 g/dL (ref 12.0–15.0)
LYMPHS ABS: 4.2 10*3/uL — AB (ref 0.7–4.0)
Lymphocytes Relative: 49 % — ABNORMAL HIGH (ref 12–46)
MCH: 27.1 pg (ref 26.0–34.0)
MCHC: 33.7 g/dL (ref 30.0–36.0)
MCV: 80.4 fL (ref 78.0–100.0)
MONOS PCT: 6 % (ref 3–12)
Monocytes Absolute: 0.5 10*3/uL (ref 0.1–1.0)
Neutro Abs: 3.7 10*3/uL (ref 1.7–7.7)
Neutrophils Relative %: 43 % (ref 43–77)
Platelets: 248 10*3/uL (ref 150–400)
RBC: 4.8 MIL/uL (ref 3.87–5.11)
RDW: 15 % (ref 11.5–15.5)
WBC: 8.5 10*3/uL (ref 4.0–10.5)

## 2014-03-23 MED ORDER — OMEPRAZOLE 20 MG PO CPDR: 20.0000 mg | DELAYED_RELEASE_CAPSULE | Freq: Every day | ORAL | Status: DC

## 2014-03-23 NOTE — Progress Notes (Unsigned)
Patient ID: Katherine [name], female   DOB: 12/26/1970, 43 y.o.   MRN: [id number] Letter placed in Dr. Conley Canal Box regarding needing a letter about disability. Vivia [place], RN

## 2014-03-23 NOTE — Patient Instructions (Addendum)
Return to clinic if symptoms worsen or persist. Take your medications as prescribed.  Gastroesophageal Reflux Disease, Adult Gastroesophageal reflux disease (GERD) happens when acid from your stomach flows up into the esophagus. When acid comes in contact with the esophagus, the acid causes soreness (inflammation) in the esophagus. Over time, GERD may create small holes (ulcers) in the lining of the esophagus. CAUSES   Increased body weight. This puts pressure on the stomach, making acid rise from the stomach into the esophagus.  Smoking. This increases acid production in the stomach.  Drinking alcohol. This causes decreased pressure in the lower esophageal sphincter (valve or ring of muscle between the esophagus and stomach), allowing acid from the stomach into the esophagus.  Late evening meals and a full stomach. This increases pressure and acid production in the stomach.  A malformed lower esophageal sphincter. Sometimes, no cause is found. SYMPTOMS   Burning pain in the lower part of the mid-chest behind the breastbone and in the mid-stomach area. This may occur twice a week or more often.  Trouble swallowing.  Sore throat.  Dry cough.  Asthma-like symptoms including chest tightness, shortness of breath, or wheezing. DIAGNOSIS  Your caregiver may be able to diagnose GERD based on your symptoms. In some cases, X-rays and other tests may be done to check for complications or to check the condition of your stomach and esophagus. TREATMENT  Your caregiver may recommend over-the-counter or prescription medicines to help decrease acid production. Ask your caregiver before starting or adding any new medicines.  HOME CARE INSTRUCTIONS   Change the factors that you can control. Ask your caregiver for guidance concerning weight loss, quitting smoking, and alcohol consumption.  Avoid foods and drinks that make your symptoms worse, such as:  Caffeine or alcoholic  drinks.  Chocolate.  Peppermint or mint flavorings.  Garlic and onions.  Spicy foods.  Citrus fruits, such as oranges, lemons, or limes.  Tomato-based foods such as sauce, chili, salsa, and pizza.  Fried and fatty foods.  Avoid lying down for the 3 hours prior to your bedtime or prior to taking a nap.  Eat small, frequent meals instead of large meals.  Wear loose-fitting clothing. Do not wear anything tight around your waist that causes pressure on your stomach.  Raise the head of your bed 6 to 8 inches with wood blocks to help you sleep. Extra pillows will not help.  Only take over-the-counter or prescription medicines for pain, discomfort, or fever as directed by your caregiver.  Do not take aspirin, ibuprofen, or other nonsteroidal anti-inflammatory drugs (NSAIDs). SEEK IMMEDIATE MEDICAL CARE IF:   You have pain in your arms, neck, jaw, teeth, or back.  Your pain increases or changes in intensity or duration.  You develop nausea, vomiting, or sweating (diaphoresis).  You develop shortness of breath, or you faint.  Your vomit is green, yellow, black, or looks like coffee grounds or blood.  Your stool is red, bloody, or black. These symptoms could be signs of other problems, such as heart disease, gastric bleeding, or esophageal bleeding. MAKE SURE YOU:   Understand these instructions.  Will watch your condition.  Will get help right away if you are not doing well or get worse. Document Released: 06/21/2005 Document Revised: 12/04/2011 Document Reviewed: 03/31/2011 [facility] Patient Information 2015 [place], [place]. This information is not intended to replace advice given to you by your health care provider. Make sure you discuss any questions you have with your health care provider. Food Choices  for Gastroesophageal Reflux Disease When you have gastroesophageal reflux disease (GERD), the foods you eat and your eating habits are very important. Choosing the right  foods can help ease your discomfort.  WHAT GUIDELINES DO I NEED TO FOLLOW?   Choose fruits, vegetables, whole grains, and low-fat dairy products.   Choose low-fat meat, fish, and poultry.  Limit fats such as oils, salad dressings, butter, nuts, and avocado.   Keep a food diary. This helps you identify foods that cause symptoms.   Avoid foods that cause symptoms. These may be different for everyone.   Eat small meals often instead of 3 large meals a day.   Eat your meals slowly, in a place where you are relaxed.   Limit fried foods.   Cook foods using methods other than frying.   Avoid drinking alcohol.   Avoid drinking large amounts of liquids with your meals.   Avoid bending over or lying down until 2-3 hours after eating.  WHAT FOODS ARE NOT RECOMMENDED?  These are some foods and drinks that may make your symptoms worse: Vegetables Tomatoes. Tomato juice. Tomato and spaghetti sauce. Chili peppers. Onion and garlic. Horseradish. Fruits Oranges, grapefruit, and lemon (fruit and juice). Meats High-fat meats, fish, and poultry. This includes hot dogs, ribs, ham, sausage, salami, and bacon. Dairy Whole milk and chocolate milk. Sour cream. Cream. Butter. Ice cream. Cream cheese.  Drinks Coffee and tea. Bubbly (carbonated) drinks or energy drinks. Condiments Hot sauce. Barbecue sauce.  Sweets/Desserts Chocolate and cocoa. Donuts. Peppermint and spearmint. Fats and Oils High-fat foods. This includes [place] fries and potato chips. Other Vinegar. Strong spices. This includes black pepper, white pepper, red pepper, cayenne, curry powder, cloves, ginger, and chili powder. The items listed above may not be a complete list of foods and drinks to avoid. Contact your dietitian for more information. Document Released: 03/12/2012 Document Revised: 09/16/2013 Document Reviewed: 07/16/2013 [facility] Patient Information 2015 [place], [place]. This information is not intended to  replace advice given to you by your health care provider. Make sure you discuss any questions you have with your health care provider.

## 2014-03-23 NOTE — Progress Notes (Unsigned)
Patient presents today with c/o nausea and vomiting x 1 week. Patient does state she has sharp pain LUQ, has been tired and having chills. Consulted with Roney Jaffe, NP who verbally ordered omeprazole, CBC, and TSH. Instructed patient to take Omeprazole 30 min. before a meal. Patient verbalized understanding.  Vivia Birmingham, RN

## 2014-03-24 LAB — TSH: TSH: 0.906 u[IU]/mL (ref 0.350–4.500)

## 2014-03-25 ENCOUNTER — Telehealth: Payer: Self-pay | Admitting: Internal Medicine

## 2014-03-25 NOTE — Telephone Encounter (Signed)
Pt has called in today to request that Dr. Annitta Needs sign off and fax a letter of verification that pt is and will be on disability for the next 36 months; pt dropped of medical record from [facility] on 6/30 and was given to the nurse; pt does have a sense of urgency on this matter as she is attempting to get new housing for herself and her son; please f/u with this request at your earliest convenience

## 2014-03-26 NOTE — Telephone Encounter (Signed)
Letters were written and patient picked them up yesterday

## 2014-03-30 ENCOUNTER — Telehealth: Payer: Self-pay

## 2014-03-30 NOTE — Telephone Encounter (Signed)
Message copied by Dorothe Pea on Mon Mar 30, 2014 12:17 PM ------      Message from: Chari Manning A      Created: Wed Mar 25, 2014  9:55 PM       Let patient know CBC and thyroid panel is normal. ------

## 2014-03-30 NOTE — Telephone Encounter (Signed)
Left message on voice mail that her lab Work was normal

## 2014-04-26 ENCOUNTER — Encounter (HOSPITAL_COMMUNITY): Payer: Self-pay | Admitting: Emergency Medicine

## 2014-04-26 ENCOUNTER — Emergency Department (HOSPITAL_COMMUNITY): Payer: Medicaid Other

## 2014-04-26 ENCOUNTER — Emergency Department (HOSPITAL_COMMUNITY)
Admission: EM | Admit: 2014-04-26 | Discharge: 2014-04-26 | Disposition: A | Discharge status: Home / Self Care | Payer: Medicaid Other | Attending: Emergency Medicine | Admitting: Emergency Medicine

## 2014-04-26 DIAGNOSIS — Z8669 Personal history of other diseases of the nervous system and sense organs: Secondary | ICD-10-CM | POA: Diagnosis not present

## 2014-04-26 DIAGNOSIS — Z79899 Other long term (current) drug therapy: Secondary | ICD-10-CM | POA: Diagnosis not present

## 2014-04-26 DIAGNOSIS — J45909 Unspecified asthma, uncomplicated: Secondary | ICD-10-CM | POA: Diagnosis not present

## 2014-04-26 DIAGNOSIS — F17200 Nicotine dependence, unspecified, uncomplicated: Secondary | ICD-10-CM | POA: Insufficient documentation

## 2014-04-26 DIAGNOSIS — Z880 Allergy status to penicillin: Secondary | ICD-10-CM | POA: Diagnosis not present

## 2014-04-26 DIAGNOSIS — Z8739 Personal history of other diseases of the musculoskeletal system and connective tissue: Secondary | ICD-10-CM | POA: Diagnosis not present

## 2014-04-26 DIAGNOSIS — K047 Periapical abscess without sinus: Secondary | ICD-10-CM

## 2014-04-26 DIAGNOSIS — K089 Disorder of teeth and supporting structures, unspecified: Secondary | ICD-10-CM | POA: Insufficient documentation

## 2014-04-26 DIAGNOSIS — Z8701 Personal history of pneumonia (recurrent): Secondary | ICD-10-CM | POA: Diagnosis not present

## 2014-04-26 DIAGNOSIS — Z8659 Personal history of other mental and behavioral disorders: Secondary | ICD-10-CM | POA: Insufficient documentation

## 2014-04-26 MED ORDER — IOHEXOL 300 MG/ML  SOLN
80.0000 mL | Freq: Once | INTRAMUSCULAR | Status: AC | PRN
  Administered 2014-04-26: 80 mL via INTRAVENOUS

## 2014-04-26 MED ORDER — CLINDAMYCIN PHOSPHATE 900 MG/50ML IV SOLN
900.0000 mg | Freq: Once | INTRAVENOUS | Status: AC
  Administered 2014-04-26: 900 mg via INTRAVENOUS
  Filled 2014-04-26: qty 50

## 2014-04-26 MED ORDER — OXYCODONE-ACETAMINOPHEN 5-325 MG PO TABS: 1.0000 | ORAL_TABLET | Freq: Four times a day (QID) | ORAL | Status: DC | PRN

## 2014-04-26 MED ORDER — CLINDAMYCIN HCL 150 MG PO CAPS: 150.0000 mg | ORAL_CAPSULE | Freq: Four times a day (QID) | ORAL | Status: DC

## 2014-04-26 MED ORDER — FENTANYL CITRATE 0.05 MG/ML IJ SOLN
100.0000 ug | Freq: Once | INTRAMUSCULAR | Status: AC
  Administered 2014-04-26: 100 ug via INTRAVENOUS
  Filled 2014-04-26: qty 2

## 2014-04-26 NOTE — ED Provider Notes (Signed)
Medical screening examination/treatment/procedure(s) were performed by non-physician practitioner and as supervising physician I was immediately available for consultation/collaboration.   EKG Interpretation None        Malvin Johns, MD 04/26/14 1652

## 2014-04-26 NOTE — ED Notes (Signed)
Pt transported to CT scan.

## 2014-04-26 NOTE — ED Notes (Addendum)
Pt states L lower molar pain, also states severe sore throat and swelling to L side of face. Ongoing x2 days. Pt reports difficulty swallowing. 5/10 pain at the time. Respirations unlabored.

## 2014-04-26 NOTE — Discharge Instructions (Signed)
Return here as needed for any worsening in your condition. Follow up with your dentist or the one provided.

## 2014-04-26 NOTE — ED Notes (Signed)
The pt is c/o having a toothache and a sore throat for 2 days .  Not sure if she has had a temp

## 2014-04-26 NOTE — ED Provider Notes (Signed)
CSN: [id number]     Arrival date & time 04/26/14  5465 History   First MD Initiated Contact with Patient 04/26/14 [phone]     Chief Complaint  Patient presents with  . Dental Pain     (Consider location/radiation/quality/duration/timing/severity/associated sxs/prior Treatment) HPI Patient presents to the emergency department with dental pain, and facial swelling, that started 2 days, ago.  Patient, states, that her back.  Tooth is very painful and appears to be draining some material from the tooth.  The patient, states, that she did not take any medications prior to arrival.  Patient denies chest pain, shortness of breath, difficulty swallowing, difficulty breathing, neck swelling, headache, blurred vision, weakness, dizziness, fever, nausea, vomiting, or syncope.  The patient, states, that nothing seems make her condition, better and palpation makes the pain, worse. patient, states, that she does have a dentist Past Medical History  Diagnosis Date  . Pneumonia     with cavitation of left lower lobe  . Asthma   . Migraine   . Hot flashes   . Palpitations   . Chest pressure   . Anxiety   . Fibromyalgia    Past Surgical History  Procedure Laterality Date  . Tubal ligation  1993   Family History  Problem Relation Age of Onset  . Migraines Daughter   . Migraines Son   . Hypertension    . Hypertension Mother   . Hypertension Father   . Colon cancer Neg Hx   . Colon polyps Neg Hx   . Diabetes Neg Hx   . Heart disease Mother   . Heart disease Father   . Kidney disease Neg Hx   . Liver disease Neg Hx   . Breast cancer Sister 22    deceased at age 27   History  Substance Use Topics  . Smoking status: Current Some Day Smoker -- 0.05 packs/day    Types: Cigarettes  . Smokeless tobacco: Never Used     Comment: form given 01-20-13  . Alcohol Use: No   OB History   Grav Para Term Preterm Abortions TAB SAB Ect Mult Living   2 2 1 1      2      Review of Systems All other  systems negative except as documented in the HPI. All pertinent positives and negatives as reviewed in the HPI.   Allergies  Morphine; Shellfish allergy; Codeine; Hydrocodone; Penicillins; and Tomato  Home Medications   Prior to Admission medications   Medication Sig Start Date End Date Taking? Authorizing Provider  cetirizine (ZYRTEC) 10 MG tablet Take 10 mg by mouth daily.   Yes Historical Provider, MD  famotidine (PEPCID) 20 MG tablet Take 1 tablet (20 mg total) by mouth 2 (two) times daily. 11/06/13  Yes Doran Heater, MD  albuterol (VENTOLIN HFA) 108 (90 BASE) MCG/ACT inhaler Inhale 2 puffs into the lungs every 6 (six) hours as needed. For shortness of breath 03/31/13   Debbe Odea, MD   BP 112/83  Pulse 70  Temp(Src) 98.9 F (37.2 C)  Resp 18  Ht 5\' 5"  (1.651 m)  Wt 156 lb (70.761 kg)  BMI 25.96 kg/m2  SpO2 99%  LMP 04/18/2014 Physical Exam  Nursing note and vitals reviewed. Constitutional: She is oriented to person, place, and time. She appears well-developed and well-nourished. No distress.  HENT:  Head: Normocephalic and atraumatic.  Mouth/Throat: Uvula is midline, oropharynx is clear and moist and mucous membranes are normal. Abnormal dentition. Dental abscesses and dental  caries present. No uvula swelling. No oropharyngeal exudate, posterior oropharyngeal erythema or tonsillar abscesses.    Eyes: Pupils are equal, round, and reactive to light.  Neck: Normal range of motion. Neck supple. No thyromegaly present.  Cardiovascular: Normal rate, regular rhythm and normal heart sounds.   Pulmonary/Chest: Effort normal and breath sounds normal.  Neurological: She is alert and oriented to person, place, and time. She exhibits normal muscle tone. Coordination normal.  Skin: Skin is warm and dry.    ED Course  Procedures (including critical care time) Labs Review Labs Reviewed - No data to display  Imaging Review Ct Soft Tissue Neck W Contrast  04/26/2014   CLINICAL DATA:   Left-sided dental and throat pain.  EXAM: CT NECK WITH CONTRAST  TECHNIQUE: Multidetector CT imaging of the neck was performed using the standard protocol following the bolus administration of intravenous contrast.  CONTRAST:  10mL OMNIPAQUE IOHEXOL 300 MG/ML  SOLN  COMPARISON:  Prior head CT 06/19/2012  FINDINGS: The visualized intracranial contents and blood vessels are unremarkable. Unremarkable CT appearance of the thyroid gland. The lung apices are unremarkable. Vascular structures are all widely patent without evidence of significant atherosclerotic disease, stenosis or other acute abnormality.  In the region of BB marker there are not enlarged by CT criteria a but prominent (relative to the contralateral side) enhancing lymph nodes measuring 8 and 9 mm in short axis. Unremarkable appearance of the parotid, submandibular and sublingual salivary glands.  Streak artifact from numerous a dental amalgams significantly limits evaluation of the tongue and tonsils. There does appear to be asymmetric fullness in the region of the left faucial tonsil with ill-defined hypoattenuation centrally concerning for acute suppurative tonsillitis. No definite rim enhancing fluid collection to suggest abscess. Again, evaluation is limited by marked streak artifact.  No focal calvarial abnormality. The mastoid air cells and visualized paranasal sinuses are normally aerated. Numerous missing teeth and dental amalgams. Unerupted left mandibular wisdom tooth. No significant periodontal disease or evidence of osteonecrosis. No focal soft tissue abscess. The visualized cervical spine demonstrates no evidence of acute fracture or malalignment. No significant spondylosis.  IMPRESSION: 1. Asymmetric fullness of the left faucial tonsil with central hypoattenuation concerning for acute suppurative tonsillitis. Please note that evaluation of this region is significantly limited secondary to extensive streak artifact from multiple dental  amalgams. As such, a tonsillar mass is not excluded radiographically. Recommend direct visualization. 2. In the region of the palpable abnormality marker, there is asymmetrically (relative to the contralateral side) prominent, but not enlarged by CT criteria cervical adenopathy. These cervical nodes are favored to be reactive and related to the underlying left tonsillitis. 3. No evidence of significant periodontal disease and no evidence of odontogenic abscess.   Electronically Signed   By: Jacqulynn Cadet M.D.   On: 04/26/2014 09:00    Patient has a dental infection and is referred to dentistry.  She was given IV clindamycin here in the emergency department and sent home with prescription for 450 mg 3 times a day over the next 10 days she is a rest return here for any worsening in her condition.  She is advised to increase her fluid intake.  Rinse with warm water and peroxide 3 times a day.  Patient's airway is intact and there is no swelling of the uvula or posterior oropharynx  Brent General, PA-C 04/26/14 1513

## 2014-07-13 ENCOUNTER — Encounter: Payer: Self-pay | Admitting: Internal Medicine

## 2014-07-13 ENCOUNTER — Other Ambulatory Visit: Payer: Medicaid Other

## 2014-07-13 ENCOUNTER — Ambulatory Visit: Payer: Medicaid Other | Attending: Internal Medicine | Admitting: Internal Medicine

## 2014-07-13 VITALS — BP 111/76 | HR 68 | Temp 98.0°F | Resp 16 | Wt 147.0 lb

## 2014-07-13 DIAGNOSIS — J45909 Unspecified asthma, uncomplicated: Secondary | ICD-10-CM | POA: Diagnosis not present

## 2014-07-13 DIAGNOSIS — F17210 Nicotine dependence, cigarettes, uncomplicated: Secondary | ICD-10-CM | POA: Diagnosis not present

## 2014-07-13 DIAGNOSIS — R0981 Nasal congestion: Secondary | ICD-10-CM

## 2014-07-13 DIAGNOSIS — R51 Headache: Secondary | ICD-10-CM

## 2014-07-13 DIAGNOSIS — G4489 Other headache syndrome: Secondary | ICD-10-CM | POA: Diagnosis not present

## 2014-07-13 DIAGNOSIS — R002 Palpitations: Secondary | ICD-10-CM | POA: Insufficient documentation

## 2014-07-13 DIAGNOSIS — J0100 Acute maxillary sinusitis, unspecified: Secondary | ICD-10-CM | POA: Insufficient documentation

## 2014-07-13 DIAGNOSIS — R05 Cough: Secondary | ICD-10-CM

## 2014-07-13 DIAGNOSIS — R519 Headache, unspecified: Secondary | ICD-10-CM

## 2014-07-13 DIAGNOSIS — R059 Cough, unspecified: Secondary | ICD-10-CM

## 2014-07-13 MED ORDER — BENZONATATE 100 MG PO CAPS: 100.0000 mg | ORAL_CAPSULE | Freq: Three times a day (TID) | ORAL | Status: DC | PRN

## 2014-07-13 MED ORDER — FLUTICASONE PROPIONATE 50 MCG/ACT NA SUSP: 2.0000 | Freq: Every day | NASAL | Status: DC

## 2014-07-13 MED ORDER — AZITHROMYCIN 250 MG PO TABS: ORAL_TABLET | ORAL | Status: DC

## 2014-07-13 MED ORDER — ALBUTEROL SULFATE HFA 108 (90 BASE) MCG/ACT IN AERS: 2.0000 | INHALATION_SPRAY | Freq: Four times a day (QID) | RESPIRATORY_TRACT | Status: DC | PRN

## 2014-07-13 NOTE — Progress Notes (Signed)
Patient here for follow up Complains of her heart racing that started about a month ago

## 2014-07-13 NOTE — Progress Notes (Signed)
MRN: [id number] Name: Katherine [name]  Sex: female Age: 43 y.o. DOB: 1970-12-04  Allergies: Morphine; Shellfish allergy; Codeine; Hydrocodone; Penicillins; and Tomato  Chief Complaint  Patient presents with  . Follow-up    HPI: Patient is 43 y.o. female who comes today reported to have sinus congestion postnasal drip coughing wheezing for the last few days, patient also reported to have history of asthma and she ran out of her inhaler, she does smoke cigarettes, she also history of chronic headaches and as per patient recently worse, she also history of atrial tachycardia and for the last few weeks she has been having repetitions, EMR reviewed she was seen by cardiologist in April and she is advised to make a followup appointment today her EKGs in office is unchanged compared to the previous one  Past Medical History  Diagnosis Date  . Pneumonia     with cavitation of left lower lobe  . Asthma   . Migraine   . Hot flashes   . Palpitations   . Chest pressure   . Anxiety   . Fibromyalgia     Past Surgical History  Procedure Laterality Date  . Tubal ligation  1993      Medication List       This list is accurate as of: 07/13/14  4:32 PM.  Always use your most recent med list.               albuterol 108 (90 BASE) MCG/ACT inhaler  Commonly known as:  VENTOLIN HFA  Inhale 2 puffs into the lungs every 6 (six) hours as needed. For shortness of breath     azithromycin 250 MG tablet  Commonly known as:  ZITHROMAX Z-PAK  Take as directed     benzonatate 100 MG capsule  Commonly known as:  TESSALON  Take 1 capsule (100 mg total) by mouth 3 (three) times daily as needed for cough.     cetirizine 10 MG tablet  Commonly known as:  ZYRTEC  Take 10 mg by mouth daily.     clindamycin 150 MG capsule  Commonly known as:  CLEOCIN  Take 1 capsule (150 mg total) by mouth every 6 (six) hours.     famotidine 20 MG tablet  Commonly known as:  PEPCID  Take 1 tablet (20 mg  total) by mouth 2 (two) times daily.     fluticasone 50 MCG/ACT nasal spray  Commonly known as:  FLONASE  Place 2 sprays into both nostrils daily.     oxyCODONE-acetaminophen 5-325 MG per tablet  Commonly known as:  PERCOCET/ROXICET  Take 1 tablet by mouth every 6 (six) hours as needed for severe pain.        Meds ordered this encounter  Medications  . albuterol (VENTOLIN HFA) 108 (90 BASE) MCG/ACT inhaler    Sig: Inhale 2 puffs into the lungs every 6 (six) hours as needed. For shortness of breath    Dispense:  1 each    Refill:  3  . fluticasone (FLONASE) 50 MCG/ACT nasal spray    Sig: Place 2 sprays into both nostrils daily.    Dispense:  16 g    Refill:  3  . azithromycin (ZITHROMAX Z-PAK) 250 MG tablet    Sig: Take as directed    Dispense:  6 each    Refill:  0  . benzonatate (TESSALON) 100 MG capsule    Sig: Take 1 capsule (100 mg total) by mouth 3 (three) times daily as  needed for cough.    Dispense:  30 capsule    Refill:  1    Immunization History  Administered Date(s) Administered  . Influenza Whole 07/23/2009, 07/04/2010  . Pneumococcal Polysaccharide-23 07/23/2009  . Td 03/30/2008    Family History  Problem Relation Age of Onset  . Migraines Daughter   . Migraines Son   . Hypertension    . Hypertension Mother   . Hypertension Father   . Colon cancer Neg Hx   . Colon polyps Neg Hx   . Diabetes Neg Hx   . Heart disease Mother   . Heart disease Father   . Kidney disease Neg Hx   . Liver disease Neg Hx   . Breast cancer Sister 7    deceased at age 26    History  Substance Use Topics  . Smoking status: Current Some Day Smoker -- 0.05 packs/day    Types: Cigarettes  . Smokeless tobacco: Never Used     Comment: form given 01-20-13  . Alcohol Use: No    Review of Systems   As noted in HPI  Filed Vitals:   07/13/14 1606  BP: 111/76  Pulse: 68  Temp: 98 F (36.7 C)  Resp: 16    Physical Exam  Physical Exam  HENT:  Nasal congestion  sinus tenderness   Eyes: EOM are normal. Pupils are equal, round, and reactive to light.  Cardiovascular: Normal rate and regular rhythm.   Pulmonary/Chest: No respiratory distress. She has no wheezes. She has no rales.  Musculoskeletal: She exhibits no edema.    CBC    Component Value Date/Time   WBC 8.5 03/23/2014 1441   RBC 4.80 03/23/2014 1441   HGB 13.0 03/23/2014 1441   HCT 38.6 03/23/2014 1441   PLT 248 03/23/2014 1441   MCV 80.4 03/23/2014 1441   LYMPHSABS 4.2* 03/23/2014 1441   MONOABS 0.5 03/23/2014 1441   EOSABS 0.2 03/23/2014 1441   BASOSABS 0.0 03/23/2014 1441    CMP     Component Value Date/Time   NA 134* 09/05/2013 1459   K 4.2 09/05/2013 1459   CL 105 09/05/2013 1459   CO2 26 09/05/2013 1459   GLUCOSE 87 09/05/2013 1459   BUN 8 09/05/2013 1459   CREATININE 0.60 09/05/2013 1459   CREATININE 0.58 12/29/2012 1259   CALCIUM 9.2 09/05/2013 1459   PROT 7.2 09/05/2013 1459   ALBUMIN 4.5 09/05/2013 1459   AST 13 09/05/2013 1459   ALT 11 09/05/2013 1459   ALKPHOS 66 09/05/2013 1459   BILITOT 0.3 09/05/2013 1459   GFRNONAA >89 09/05/2013 1459   GFRNONAA >90 12/29/2012 1259   GFRAA >89 09/05/2013 1459   GFRAA >90 12/29/2012 1259    Lab Results  Component Value Date/Time   CHOL 145 09/05/2013  2:59 PM    No components found with this basename: hga1c    Lab Results  Component Value Date/Time   AST 13 09/05/2013  2:59 PM    Assessment and Plan  Acute maxillary sinusitis, recurrence not specified - Plan: azithromycin (ZITHROMAX Z-PAK) 250 MG tablet  Nasal congestion - Plan: fluticasone (FLONASE) 50 MCG/ACT nasal spray  Cough - Plan: benzonatate (TESSALON) 100 MG capsule  Palpitations Patient is going to make a followup appointment with her cardiologist.  Chronic nonintractable headache, unspecified headache type - Plan: CT Head Wo Contrast   Return in about 3 months (around 10/13/2014).  Lorayne Marek, MD

## 2014-07-20 ENCOUNTER — Telehealth: Payer: Self-pay | Admitting: Internal Medicine

## 2014-07-20 NOTE — Telephone Encounter (Signed)
Patient came to let PCP know that Dr Harrington Challenger ([address] [place], [place] [address] Phone # [phone]) is her Cardiologist.

## 2014-07-23 NOTE — Telephone Encounter (Signed)
Patient's cardiologist is Dr Harrington Challenger on [address] in [place]

## 2014-07-27 ENCOUNTER — Encounter: Payer: Self-pay | Admitting: Internal Medicine

## 2014-10-04 NOTE — Progress Notes (Signed)
HPI Patient is a 44 yo with a history of palpitations and atrial tach on event monitor.  Also a hx of syncope. Echo normal. I saw her back in Spring 2015  Since seen she still has problems with dizziness  Morning and PM  No syncope Feels heart race Drinks water  Not a salt person   Allergies  Allergen Reactions  . Morphine Anaphylaxis, Hives and Swelling  . Shellfish Allergy Anaphylaxis  . Codeine Hives  . Hydrocodone Hives  . Penicillins Other (See Comments)    Yeast infection  . Tomato Rash    Current Outpatient Prescriptions  Medication Sig Dispense Refill  . albuterol (VENTOLIN HFA) 108 (90 BASE) MCG/ACT inhaler Inhale 2 puffs into the lungs every 6 (six) hours as needed. For shortness of breath 1 each 3  . azithromycin (ZITHROMAX Z-PAK) 250 MG tablet Take as directed 6 each 0  . benzonatate (TESSALON) 100 MG capsule Take 1 capsule (100 mg total) by mouth 3 (three) times daily as needed for cough. 30 capsule 1  . cetirizine (ZYRTEC) 10 MG tablet Take 10 mg by mouth daily.    . clindamycin (CLEOCIN) 150 MG capsule Take 1 capsule (150 mg total) by mouth every 6 (six) hours. 90 capsule 0  . famotidine (PEPCID) 20 MG tablet Take 1 tablet (20 mg total) by mouth 2 (two) times daily. 30 tablet 2  . fluticasone (FLONASE) 50 MCG/ACT nasal spray Place 2 sprays into both nostrils daily. 16 g 3  . oxyCODONE-acetaminophen (PERCOCET/ROXICET) 5-325 MG per tablet Take 1 tablet by mouth every 6 (six) hours as needed for severe pain. 15 tablet 0  . [DISCONTINUED] diltiazem (CARDIZEM SR) 120 MG 12 hr capsule Take 1 capsule (120 mg total) by mouth daily. 30 capsule 6   No current facility-administered medications for this visit.    Past Medical History  Diagnosis Date  . Pneumonia     with cavitation of left lower lobe  . Asthma   . Migraine   . Hot flashes   . Palpitations   . Chest pressure   . Anxiety   . Fibromyalgia     Past Surgical History  Procedure Laterality Date  . Tubal  ligation  1993    Family History  Problem Relation Age of Onset  . Migraines Daughter   . Migraines Son   . Hypertension    . Hypertension Mother   . Hypertension Father   . Colon cancer Neg Hx   . Colon polyps Neg Hx   . Diabetes Neg Hx   . Heart disease Mother   . Heart disease Father   . Kidney disease Neg Hx   . Liver disease Neg Hx   . Breast cancer Sister 37    deceased at age 62  . Heart attack Neg Hx   . Stroke Neg Hx     History   Social History  . Marital Status: Married    Spouse Name: N/A    Number of Children: N/A  . Years of Education: N/A   Occupational History  . Not on file.   Social History Main Topics  . Smoking status: Current Some Day Smoker -- 0.05 packs/day    Types: Cigarettes  . Smokeless tobacco: Never Used     Comment: form given 01-20-13  . Alcohol Use: No  . Drug Use: No     Comment: pt states she has d/c marijuana  . Sexual Activity: Yes    Birth Control/ Protection: Surgical  Other Topics Concern  . Not on file   Social History Narrative   On disability for heart condition.  Pt. Was non-specific.     Review of Systems:  All systems reviewed.  They are negative to the above problem except as previously stated.  Vital Signs: BP 122/81 mmHg  Pulse 53  Ht 5\' 5"  (1.651 m)  Wt 145 lb (65.772 kg)  BMI 24.13 kg/m2  Physical Exam Patient  In NAD HEENT:  Normocephalic, atraumatic. EOMI, PERRLA.  Neck: JVP is normal.  No bruits.  Lungs: clear to auscultation. No rales no wheezes.  Heart: Regular rate and rhythm. Normal S1, S2. No S3.   No significant murmurs. PMI not displaced.  Abdomen:  Supple, nontender. Normal bowel sounds. No masses. No hepatomegaly.  Extremities:   Good distal pulses throughout. No lower extremity edema.  Musculoskeletal :moving all extremities.  Neuro:   alert and oriented x3.  CN II-XII grossly intact.  EKG  SB 53 bpm Assessment and Plan:  1.  Palpitations/ dizziness  Not orthostatic on exam Will  set up for event monitor   Encouraged patinet to increase salt intake     F/U later this spring

## 2014-10-05 ENCOUNTER — Encounter: Payer: Self-pay | Admitting: *Deleted

## 2014-10-05 ENCOUNTER — Other Ambulatory Visit: Payer: Self-pay | Admitting: Internal Medicine

## 2014-10-05 ENCOUNTER — Encounter: Payer: Self-pay | Admitting: Internal Medicine

## 2014-10-05 ENCOUNTER — Encounter (INDEPENDENT_AMBULATORY_CARE_PROVIDER_SITE_OTHER): Payer: Medicaid Other

## 2014-10-05 ENCOUNTER — Ambulatory Visit (INDEPENDENT_AMBULATORY_CARE_PROVIDER_SITE_OTHER): Payer: Medicaid Other | Admitting: Internal Medicine

## 2014-10-05 VITALS — BP 122/81 | HR 53 | Ht 65.0 in | Wt 145.0 lb

## 2014-10-05 DIAGNOSIS — R002 Palpitations: Secondary | ICD-10-CM

## 2014-10-05 NOTE — Patient Instructions (Signed)
Your physician recommends that you continue on your current medications as directed. Please refer to the Current Medication list given to you today.  Your physician has recommended that you wear an event monitor. Event monitors are medical devices that record the heart's electrical activity. Doctors most often [place] these monitors to diagnose arrhythmias. Arrhythmias are problems with the speed or rhythm of the heartbeat. The monitor is a small, portable device. You can wear one while you do your normal daily activities. This is usually used to diagnose what is causing palpitations/syncope (passing out).  Increase your intake of fluids (water with electrolyte tabs like Nun tablets, or gatorade) , protein ( hard boiled eggs, chicken, fish) , and a electrolytes ( V-8 juice, salt, potassium chloride  which is sold as No-Salt  Your physician wants you to follow-up in: 3 months with Dr. Harrington Challenger.  You will receive a reminder letter in the mail two months in advance. If you don't receive a letter, please call our office to schedule the follow-up appointment.

## 2014-10-05 NOTE — Progress Notes (Signed)
Patient ID: Katherine [name], female   DOB: 16-Jun-1971, 44 y.o.   MRN: [id number] [employer] 30 day cardiac event monitor applied to patient.

## 2014-10-30 ENCOUNTER — Encounter (HOSPITAL_COMMUNITY): Payer: Self-pay | Admitting: Emergency Medicine

## 2014-10-30 ENCOUNTER — Emergency Department (INDEPENDENT_AMBULATORY_CARE_PROVIDER_SITE_OTHER)
Admission: EM | Admit: 2014-10-30 | Discharge: 2014-10-30 | Disposition: A | Discharge status: Home / Self Care | Payer: Medicaid Other | Source: Home / Self Care | Attending: Family Medicine | Admitting: Family Medicine

## 2014-10-30 DIAGNOSIS — S39012A Strain of muscle, fascia and tendon of lower back, initial encounter: Secondary | ICD-10-CM

## 2014-10-30 DIAGNOSIS — R51 Headache: Secondary | ICD-10-CM | POA: Diagnosis not present

## 2014-10-30 DIAGNOSIS — R519 Headache, unspecified: Secondary | ICD-10-CM

## 2014-10-30 MED ORDER — AMITRIPTYLINE HCL 50 MG PO TABS: 50.0000 mg | ORAL_TABLET | Freq: Every day | ORAL | Status: DC

## 2014-10-30 MED ORDER — KETOROLAC TROMETHAMINE 30 MG/ML IJ SOLN
30.0000 mg | Freq: Once | INTRAMUSCULAR | Status: AC
Start: 2014-10-30 — End: 2014-10-30
  Administered 2014-10-30: 30 mg via INTRAMUSCULAR

## 2014-10-30 MED ORDER — KETOROLAC TROMETHAMINE 30 MG/ML IJ SOLN
INTRAMUSCULAR | Status: AC
  Filled 2014-10-30: qty 1

## 2014-10-30 NOTE — ED Notes (Signed)
Pt states that she has been suffering from a headache and back pain for three weeks. She is currently wearing a heart monitor.

## 2014-10-30 NOTE — ED Provider Notes (Signed)
CSN: [id number]     Arrival date & time 10/30/14  88 History   First MD Initiated Contact with Patient 10/30/14 1609     Chief Complaint  Patient presents with  . Headache  . Back Pain   (Consider location/radiation/quality/duration/timing/severity/associated sxs/prior Treatment) Patient is a 44 y.o. female presenting with headaches. The history is provided by the patient.  Headache Pain location:  Generalized Quality:  Dull (pressure) Radiates to:  Does not radiate Onset quality:  Gradual Duration:  3 weeks Progression:  Unchanged Chronicity:  New Similar to prior headaches: no   Relieved by:  None tried Worsened by:  Nothing tried Ineffective treatments:  None tried Associated symptoms: no dizziness, no pain, no loss of balance, no nausea, no neck pain, no neck stiffness, no numbness, no visual change and no vomiting   Associated symptoms comment:  Felt a pop in low back when turning, no neuro sx. Risk factors comment:  H/o migraines.   Past Medical History  Diagnosis Date  . Pneumonia     with cavitation of left lower lobe  . Asthma   . Migraine   . Hot flashes   . Palpitations   . Chest pressure   . Anxiety   . Fibromyalgia    Past Surgical History  Procedure Laterality Date  . Tubal ligation  1993   Family History  Problem Relation Age of Onset  . Migraines Daughter   . Migraines Son   . Hypertension    . Hypertension Mother   . Hypertension Father   . Colon cancer Neg Hx   . Colon polyps Neg Hx   . Diabetes Neg Hx   . Heart disease Mother   . Heart disease Father   . Kidney disease Neg Hx   . Liver disease Neg Hx   . Breast cancer Sister 19    deceased at age 37  . Heart attack Neg Hx   . Stroke Neg Hx    History  Substance Use Topics  . Smoking status: Current Some Day Smoker -- 0.05 packs/day    Types: Cigarettes  . Smokeless tobacco: Never Used     Comment: form given 01-20-13  . Alcohol Use: No   OB History    Gravida Para Term Preterm  AB TAB SAB Ectopic Multiple Living   2 2 1 1      2      Review of Systems  Constitutional: Negative.   Eyes: Negative for pain.  Gastrointestinal: Negative.  Negative for nausea and vomiting.  Genitourinary: Negative.   Musculoskeletal: Negative for neck pain and neck stiffness.  Neurological: Positive for headaches. Negative for dizziness, weakness, light-headedness, numbness and loss of balance.    Allergies  Morphine; Shellfish allergy; Codeine; Hydrocodone; Penicillins; and Tomato  Home Medications   Prior to Admission medications   Medication Sig Start Date End Date Taking? Authorizing Provider  albuterol (VENTOLIN HFA) 108 (90 BASE) MCG/ACT inhaler Inhale 2 puffs into the lungs every 6 (six) hours as needed. For shortness of breath 07/13/14  Yes Deepak Advani, MD  cetirizine (ZYRTEC) 10 MG tablet Take 10 mg by mouth daily.   Yes Historical Provider, MD  famotidine (PEPCID) 20 MG tablet Take 1 tablet (20 mg total) by mouth 2 (two) times daily. 11/06/13  Yes Doran Heater, MD  fluticasone (FLONASE) 50 MCG/ACT nasal spray Place 2 sprays into both nostrils daily. 07/13/14  Yes Lorayne Marek, MD  amitriptyline (ELAVIL) 50 MG tablet Take 1 tablet (50 mg  total) by mouth at bedtime. 10/30/14   Billy Fischer, MD  azithromycin (ZITHROMAX Z-PAK) 250 MG tablet Take as directed 07/13/14   Lorayne Marek, MD  benzonatate (TESSALON) 100 MG capsule TAKE ONE CAPSULE 3 TIMES DAILY AS NEEDED. 10/07/14   Lorayne Marek, MD  clindamycin (CLEOCIN) 150 MG capsule Take 1 capsule (150 mg total) by mouth every 6 (six) hours. 04/26/14   [place], PA-C  oxyCODONE-acetaminophen (PERCOCET/ROXICET) 5-325 MG per tablet Take 1 tablet by mouth every 6 (six) hours as needed for severe pain. 04/26/14   Resa Miner Lawyer, PA-C   BP 128/69 mmHg  Pulse 64  Temp(Src) 98.2 F (36.8 C) (Oral)  Resp 16  SpO2 100%  LMP 10/26/2014 (Exact Date) Physical Exam  Constitutional: She is oriented to person, place, and  time. She appears well-developed and well-nourished. No distress.  HENT:  Head: Normocephalic.  Right Ear: External ear normal.  Left Ear: External ear normal.  Eyes: Conjunctivae and EOM are normal. Pupils are equal, round, and reactive to light.  Neck: Normal range of motion. Neck supple.  Abdominal: Soft. Bowel sounds are normal.  Musculoskeletal: She exhibits tenderness.       Lumbar back: She exhibits tenderness and spasm. She exhibits normal pulse.  Lymphadenopathy:    She has no cervical adenopathy.  Neurological: She is alert and oriented to person, place, and time. She exhibits normal muscle tone. Coordination normal.  Skin: Skin is warm and dry.  Nursing note and vitals reviewed.   ED Course  Procedures (including critical care time) Labs Review Labs Reviewed - No data to display  Imaging Review No results found.   MDM   1. Low back strain, initial encounter   2. Headache disorder        Billy Fischer, MD 10/30/14 [phone]

## 2014-10-30 NOTE — Discharge Instructions (Signed)
Heat on back , use medicine as prescribed, see your doctor if further problems.

## 2014-11-06 ENCOUNTER — Encounter (HOSPITAL_COMMUNITY): Payer: Self-pay

## 2014-11-06 ENCOUNTER — Inpatient Hospital Stay (HOSPITAL_COMMUNITY): Payer: Medicaid Other

## 2014-11-06 ENCOUNTER — Inpatient Hospital Stay (HOSPITAL_COMMUNITY)
Admission: AD | Admit: 2014-11-06 | Discharge: 2014-11-06 | Disposition: A | Discharge status: Home / Self Care | Payer: Medicaid Other | Source: Ambulatory Visit | Attending: Internal Medicine | Admitting: Internal Medicine

## 2014-11-06 DIAGNOSIS — F17210 Nicotine dependence, cigarettes, uncomplicated: Secondary | ICD-10-CM | POA: Insufficient documentation

## 2014-11-06 DIAGNOSIS — N8320 Unspecified ovarian cysts: Secondary | ICD-10-CM | POA: Diagnosis not present

## 2014-11-06 DIAGNOSIS — R109 Unspecified abdominal pain: Secondary | ICD-10-CM | POA: Diagnosis present

## 2014-11-06 DIAGNOSIS — D259 Leiomyoma of uterus, unspecified: Secondary | ICD-10-CM | POA: Insufficient documentation

## 2014-11-06 DIAGNOSIS — R1031 Right lower quadrant pain: Secondary | ICD-10-CM | POA: Diagnosis not present

## 2014-11-06 DIAGNOSIS — Z8742 Personal history of other diseases of the female genital tract: Secondary | ICD-10-CM

## 2014-11-06 LAB — CBC
HEMATOCRIT: 38.2 % (ref 36.0–46.0)
HEMOGLOBIN: 12.6 g/dL (ref 12.0–15.0)
MCH: 27.1 pg (ref 26.0–34.0)
MCHC: 33 g/dL (ref 30.0–36.0)
MCV: 82.2 fL (ref 78.0–100.0)
Platelets: 202 10*3/uL (ref 150–400)
RBC: 4.65 MIL/uL (ref 3.87–5.11)
RDW: 14.6 % (ref 11.5–15.5)
WBC: 5.3 10*3/uL (ref 4.0–10.5)

## 2014-11-06 LAB — URINALYSIS, ROUTINE W REFLEX MICROSCOPIC
BILIRUBIN URINE: NEGATIVE
Glucose, UA: NEGATIVE mg/dL
Ketones, ur: NEGATIVE mg/dL
Leukocytes, UA: NEGATIVE
Nitrite: NEGATIVE
Protein, ur: NEGATIVE mg/dL
SPECIFIC GRAVITY, URINE: 1.02 (ref 1.005–1.030)
UROBILINOGEN UA: 0.2 mg/dL (ref 0.0–1.0)
pH: 5.5 (ref 5.0–8.0)

## 2014-11-06 LAB — COMPREHENSIVE METABOLIC PANEL
ALBUMIN: 4.1 g/dL (ref 3.5–5.2)
ALK PHOS: 71 U/L (ref 39–117)
ALT: 25 U/L (ref 0–35)
AST: 26 U/L (ref 0–37)
Anion gap: 10 (ref 5–15)
BILIRUBIN TOTAL: 0.3 mg/dL (ref 0.3–1.2)
BUN: 8 mg/dL (ref 6–23)
CHLORIDE: 105 mmol/L (ref 96–112)
CO2: 24 mmol/L (ref 19–32)
CREATININE: 0.57 mg/dL (ref 0.50–1.10)
Calcium: 9.3 mg/dL (ref 8.4–10.5)
GFR calc Af Amer: >90 mL/min (ref >90)
GFR calc non Af Amer: >90 mL/min (ref >90)
GLUCOSE: 89 mg/dL (ref 70–99)
POTASSIUM: 4.4 mmol/L (ref 3.5–5.1)
Sodium: 139 mmol/L (ref 135–145)
Total Protein: 7.2 g/dL (ref 6.0–8.3)

## 2014-11-06 LAB — WET PREP, GENITAL
Clue Cells Wet Prep HPF POC: NONE SEEN
Trich, Wet Prep: NONE SEEN
WBC WET PREP: NONE SEEN
Yeast Wet Prep HPF POC: NONE SEEN

## 2014-11-06 LAB — URINE MICROSCOPIC-ADD ON

## 2014-11-06 LAB — POCT PREGNANCY, URINE: Preg Test, Ur: NEGATIVE

## 2014-11-06 MED ORDER — KETOROLAC TROMETHAMINE 10 MG PO TABS: 10.0000 mg | ORAL_TABLET | Freq: Four times a day (QID) | ORAL | Status: DC | PRN

## 2014-11-06 MED ORDER — KETOROLAC TROMETHAMINE 10 MG PO TABS
10.0000 mg | ORAL_TABLET | Freq: Once | ORAL | Status: AC
  Administered 2014-11-06: 10 mg via ORAL
  Filled 2014-11-06: qty 1

## 2014-11-06 NOTE — Discharge Instructions (Signed)
Abdominal Pain, Women °Abdominal (stomach, pelvic, or belly) pain can be caused by many things. It is important to tell your doctor: °· The location of the pain. °· Does it come and go or is it present all the time? °· Are there things that start the pain (eating certain foods, exercise)? °· Are there other symptoms associated with the pain (fever, nausea, vomiting, diarrhea)? °All of this is helpful to know when trying to find the cause of the pain. °CAUSES  °· Stomach: virus or bacteria infection, or ulcer. °· Intestine: appendicitis (inflamed appendix), regional ileitis (Crohn's disease), ulcerative colitis (inflamed colon), irritable bowel syndrome, diverticulitis (inflamed diverticulum of the colon), or cancer of the stomach or intestine. °· Gallbladder disease or stones in the gallbladder. °· Kidney disease, kidney stones, or infection. °· Pancreas infection or cancer. °· Fibromyalgia (pain disorder). °· Diseases of the female organs: °¨ Uterus: fibroid (non-cancerous) tumors or infection. °¨ Fallopian tubes: infection or tubal pregnancy. °¨ Ovary: cysts or tumors. °¨ Pelvic adhesions (scar tissue). °¨ Endometriosis (uterus lining tissue growing in the pelvis and on the pelvic organs). °¨ Pelvic congestion syndrome (female organs filling up with blood just before the menstrual period). °¨ Pain with the menstrual period. °¨ Pain with ovulation (producing an egg). °¨ Pain with an IUD (intrauterine device, birth control) in the uterus. °¨ Cancer of the female organs. °· Functional pain (pain not caused by a disease, may improve without treatment). °· Psychological pain. °· Depression. °DIAGNOSIS  °Your doctor will decide the seriousness of your pain by doing an examination. °· Blood tests. °· X-rays. °· Ultrasound. °· CT scan (computed tomography, special type of X-ray). °· MRI (magnetic resonance imaging). °· Cultures, for infection. °· Barium enema (dye inserted in the large intestine, to better view it with  X-rays). °· Colonoscopy (looking in intestine with a lighted tube). °· Laparoscopy (minor surgery, looking in abdomen with a lighted tube). °· Major abdominal exploratory surgery (looking in abdomen with a large incision). °TREATMENT  °The treatment will depend on the cause of the pain.  °· Many cases can be observed and treated at home. °· Over-the-counter medicines recommended by your caregiver. °· Prescription medicine. °· Antibiotics, for infection. °· Birth control pills, for painful periods or for ovulation pain. °· Hormone treatment, for endometriosis. °· Nerve blocking injections. °· Physical therapy. °· Antidepressants. °· Counseling with a psychologist or psychiatrist. °· Minor or major surgery. °HOME CARE INSTRUCTIONS  °· Do not take laxatives, unless directed by your caregiver. °· Take over-the-counter pain medicine only if ordered by your caregiver. Do not take aspirin because it can cause an upset stomach or bleeding. °· Try a clear liquid diet (broth or water) as ordered by your caregiver. Slowly move to a bland diet, as tolerated, if the pain is related to the stomach or intestine. °· Have a thermometer and take your temperature several times a day, and record it. °· Bed rest and sleep, if it helps the pain. °· Avoid sexual intercourse, if it causes pain. °· Avoid stressful situations. °· Keep your follow-up appointments and tests, as your caregiver orders. °· If the pain does not go away with medicine or surgery, you may try: °¨ Acupuncture. °¨ Relaxation exercises (yoga, meditation). °¨ Group therapy. °¨ Counseling. °SEEK MEDICAL CARE IF:  °· You notice certain foods cause stomach pain. °· Your home care treatment is not helping your pain. °· You need stronger pain medicine. °· You want your IUD removed. °· You feel faint or   lightheaded.  You develop nausea and vomiting.  You develop a rash.  You are having side effects or an allergy to your medicine. SEEK IMMEDIATE MEDICAL CARE IF:   Your  pain does not go away or gets worse.  You have a fever.  Your pain is felt only in portions of the abdomen. The right side could possibly be appendicitis. The left lower portion of the abdomen could be colitis or diverticulitis.  You are passing blood in your stools (bright red or black tarry stools, with or without vomiting).  You have blood in your urine.  You develop chills, with or without a fever.  You pass out. MAKE SURE YOU:   Understand these instructions.  Will watch your condition.  Will get help right away if you are not doing well or get worse. Document Released: 07/09/2007 Document Revised: 01/26/2014 Document Reviewed: 07/29/2009 [facility] Patient Information 2015 [place], [place]. This information is not intended to replace advice given to you by your health care provider. Make sure you discuss any questions you have with your health care provider.  Appendicitis Appendicitis means the appendix is puffy (inflamed). You need to get medical help right away. Without help, your problems can get much worse. The appendix can develop a hole (perforation). A pocket of yellowish-white fluid (abscess) can leak from the appendix. This can make you very sick. SYMPTOMS   Pain around the belly button (navel). The pain later moves toward the lower right belly (abdomen). The pain may be strong and sharp.  Tenderness in the lower right belly. The pain feels worse if you cough or move suddenly.  Feeling sick to your stomach (nausea).  Throwing up (vomiting).  No desire to eat (loss of appetite).  Fever.  Having a hard time pooping (constipation).  Watery poop (diarrhea).  Generally not feeling well. TREATMENT  In most cases, surgery is done to take out the appendix. This is done as soon as possible. This surgery is called appendectomy. Most people go home in 24 to 48 hours after surgery. If the appendix has a hole, surgery might be delayed. Any yellowish-white fluid will be  removed with a drain. A drain removes fluid from the body. You may be given antibiotic medicine that kills germs. This medicine is given through a tube in your vein (IV). You may still need surgery after the fluid has been drained. You may need to stay in the hospital longer than 48 hours. Document Released: 12/04/2011 Document Reviewed: 12/04/2011 [facility] Patient Information 2015 [place]. This information is not intended to replace advice given to you by your health care provider. Make sure you discuss any questions you have with your health care provider.

## 2014-11-06 NOTE — MAU Note (Signed)
Pain started in upper abd 2 wks ago, now more in RLQ radiates down rt leg. Is constant now. No GI problems.  Reports pain after urination. Pain became worse after tried to get intimate with husband.

## 2014-11-06 NOTE — MAU Note (Signed)
+  CVA tenderness R side

## 2014-11-06 NOTE — MAU Provider Note (Signed)
Chief Complaint: Abdominal Pain  First Provider Initiated Contact with Patient 11/06/14 1706     SUBJECTIVE HPI: Katherine [name] is a 44 y.o. G32P1102 female who presents with right-sided abd pain x 2 weeks. Getting worse. Had been at level of umbilicus, but moved down to RLQ and radiates down right leg, causes cramping in right leg. Worse w/ IC this morning. She states she is in a mutually monogamous relationship w/ husband. Has Hx ovarian cysts. Denies HX GC/Chlamydia, PID. Describes pain as dull, throbbing, 10/10 at worst. No relief w/ Tylenol.    Past Medical History  Diagnosis Date  . Pneumonia     with cavitation of left lower lobe  . Asthma   . Migraine   . Hot flashes   . Palpitations   . Chest pressure   . Anxiety   . Fibromyalgia   . Ovarian cyst    OB History  Gravida Para Term Preterm AB SAB TAB Ectopic Multiple Living  2 2 1 1      2     # Outcome Date GA Lbr Len/2nd Weight Sex Delivery Anes PTL Lv  2 Preterm           1 Term              Past Surgical History  Procedure Laterality Date  . Tubal ligation  1993   History   Social History  . Marital Status: Married    Spouse Name: N/A  . Number of Children: N/A  . Years of Education: N/A   Occupational History  . Not on file.   Social History Main Topics  . Smoking status: Current Some Day Smoker -- 0.05 packs/day    Types: Cigarettes  . Smokeless tobacco: Never Used     Comment: form given 01-20-13  . Alcohol Use: No  . Drug Use: No     Comment: pt states she has d/c marijuana  . Sexual Activity: Yes    Birth Control/ Protection: Surgical   Other Topics Concern  . Not on file   Social History Narrative   On disability for heart condition.  Pt. Was non-specific.    No current facility-administered medications on file prior to encounter.   Current Outpatient Prescriptions on File Prior to Encounter  Medication Sig Dispense Refill  . albuterol (VENTOLIN HFA) 108 (90 BASE) MCG/ACT inhaler Inhale 2  puffs into the lungs every 6 (six) hours as needed. For shortness of breath 1 each 3  . famotidine (PEPCID) 20 MG tablet Take 1 tablet (20 mg total) by mouth 2 (two) times daily. 30 tablet 2  . fluticasone (FLONASE) 50 MCG/ACT nasal spray Place 2 sprays into both nostrils daily. 16 g 3  . amitriptyline (ELAVIL) 50 MG tablet Take 1 tablet (50 mg total) by mouth at bedtime. (Patient not taking: Reported on 11/06/2014) 20 tablet 0  . azithromycin (ZITHROMAX Z-PAK) 250 MG tablet Take as directed (Patient not taking: Reported on 11/06/2014) 6 each 0  . benzonatate (TESSALON) 100 MG capsule TAKE ONE CAPSULE 3 TIMES DAILY AS NEEDED. (Patient not taking: Reported on 11/06/2014) 30 capsule 1  . clindamycin (CLEOCIN) 150 MG capsule Take 1 capsule (150 mg total) by mouth every 6 (six) hours. (Patient not taking: Reported on 11/06/2014) 90 capsule 0  . oxyCODONE-acetaminophen (PERCOCET/ROXICET) 5-325 MG per tablet Take 1 tablet by mouth every 6 (six) hours as needed for severe pain. (Patient not taking: Reported on 11/06/2014) 15 tablet 0  . [DISCONTINUED] diltiazem (CARDIZEM  SR) 120 MG 12 hr capsule Take 1 capsule (120 mg total) by mouth daily. 30 capsule 6   Allergies  Allergen Reactions  . Morphine Anaphylaxis, Hives and Swelling  . Shellfish Allergy Anaphylaxis  . Codeine Hives  . Hydrocodone Hives  . Penicillins Other (See Comments)    Yeast infection  . Tomato Rash    Review of Systems  Constitutional: Negative for fever, chills, weight loss and malaise/fatigue.       Neg for loss of appetite.   Cardiovascular: Negative for leg swelling.  Gastrointestinal: Positive for abdominal pain. Negative for heartburn, nausea, vomiting, diarrhea, constipation and blood in stool.  Genitourinary: Negative for dysuria, urgency, frequency, hematuria and flank pain.  Musculoskeletal: Negative for falls.     OBJECTIVE Blood pressure 111/82, pulse 72, temperature 99 F (37.2 C), temperature source Oral, resp.  rate 16, weight 63.957 kg (141 lb), last menstrual period 10/26/2014, SpO2 100 %. GENERAL: Well-developed, well-nourished female in mild distress.  HEENT: Normocephalic HEART: normal rate RESP: normal effort ABDOMEN: Soft, moderate low abd, > RLQ. Neg for rebound tenderness or mass. Pos guarding.  EXTREMITIES: Nontender, no edema. Normal ROM and strength.  NEURO: Alert and oriented SPECULUM EXAM: NEFG, physiologic discharge, no blood noted, cervix clean BIMANUAL: cervix closed; uterus normal size, Positive bilat adnexal tenderness, R>L. No adnexal masses. Mild CMT.   LAB RESULTS Results for orders placed or performed during the hospital encounter of 11/06/14 (from the past 24 hour(s))  Urinalysis, Routine w reflex microscopic     Status: Abnormal   Collection Time: 11/06/14  4:25 PM  Result Value Ref Range   Color, Urine YELLOW YELLOW   APPearance HAZY (A) CLEAR   Specific Gravity, Urine 1.020 1.005 - 1.030   pH 5.5 5.0 - 8.0   Glucose, UA NEGATIVE NEGATIVE mg/dL   Hgb urine dipstick TRACE (A) NEGATIVE   Bilirubin Urine NEGATIVE NEGATIVE   Ketones, ur NEGATIVE NEGATIVE mg/dL   Protein, ur NEGATIVE NEGATIVE mg/dL   Urobilinogen, UA 0.2 0.0 - 1.0 mg/dL   Nitrite NEGATIVE NEGATIVE   Leukocytes, UA NEGATIVE NEGATIVE  Urine microscopic-add on     Status: Abnormal   Collection Time: 11/06/14  4:25 PM  Result Value Ref Range   Squamous Epithelial / LPF FEW (A) RARE   WBC, UA 0-2 <3 WBC/hpf  Pregnancy, urine POC     Status: None   Collection Time: 11/06/14  5:00 PM  Result Value Ref Range   Preg Test, Ur NEGATIVE NEGATIVE  Wet prep, genital     Status: None   Collection Time: 11/06/14  5:18 PM  Result Value Ref Range   Yeast Wet Prep HPF POC NONE SEEN NONE SEEN   Trich, Wet Prep NONE SEEN NONE SEEN   Clue Cells Wet Prep HPF POC NONE SEEN NONE SEEN   WBC, Wet Prep HPF POC NONE SEEN NONE SEEN  CBC     Status: None   Collection Time: 11/06/14  5:33 PM  Result Value Ref Range   WBC  5.3 4.0 - 10.5 K/uL   RBC 4.65 3.87 - 5.11 MIL/uL   Hemoglobin 12.6 12.0 - 15.0 g/dL   HCT 38.2 36.0 - 46.0 %   MCV 82.2 78.0 - 100.0 fL   MCH 27.1 26.0 - 34.0 pg   MCHC 33.0 30.0 - 36.0 g/dL   RDW 14.6 11.5 - 15.5 %   Platelets 202 150 - 400 K/uL  Comprehensive metabolic panel     Status: None  Collection Time: 11/06/14  5:33 PM  Result Value Ref Range   Sodium 139 135 - 145 mmol/L   Potassium 4.4 3.5 - 5.1 mmol/L   Chloride 105 96 - 112 mmol/L   CO2 24 19 - 32 mmol/L   Glucose, Bld 89 70 - 99 mg/dL   BUN 8 6 - 23 mg/dL   Creatinine, Ser 0.57 0.50 - 1.10 mg/dL   Calcium 9.3 8.4 - 10.5 mg/dL   Total Protein 7.2 6.0 - 8.3 g/dL   Albumin 4.1 3.5 - 5.2 g/dL   AST 26 0 - 37 U/L   ALT 25 0 - 35 U/L   Alkaline Phosphatase 71 39 - 117 U/L   Total Bilirubin 0.3 0.3 - 1.2 mg/dL   GFR calc non Af Amer >90 >90 mL/min   GFR calc Af Amer >90 >90 mL/min   Anion gap 10 5 - 15    IMAGING US Transvaginal Non-ob  11/06/2014   CLINICAL DATA:  Worsening right lower quadrant pain for 2 weeks. History of ovarian cysts.  EXAM: TRANSABDOMINAL AND TRANSVAGINAL ULTRASOUND OF PELVIS  TECHNIQUE: Both transabdominal and transvaginal ultrasound examinations of the pelvis were performed. Transabdominal technique was performed for global imaging of the pelvis including uterus, ovaries, adnexal regions, and pelvic cul-de-sac. It was necessary to proceed with endovaginal exam following the transabdominal exam to visualize the endometrium and ovaries.  COMPARISON:  CT on 03/13/12  FINDINGS: Uterus  Measurements: 9.3 x 6.9 x 8.5 cm. A fibroid is seen in the left uterine fundus measuring 5.2 x 4.5 x 4.8 cm.  Endometrium  Thickness: 11 mm.  No focal abnormality visualized.  Right ovary  Measurements: 2.5 x 1.7 x 2.4 cm. Normal appearance/no adnexal mass.  Left ovary  Measurements: 2.4 x 1.7 x 1.5 cm. Normal appearance/no adnexal mass.  Other findings  No free fluid.  IMPRESSION: 5.2 cm fibroid in left uterine fundus,  without significant change.  Normal appearance of both ovaries.  No adnexal mass identified.   Electronically Signed   By: Earle Gell M.D.   On: 11/06/2014 18:42   US Pelvis Complete  11/06/2014   CLINICAL DATA:  Worsening right lower quadrant pain for 2 weeks. History of ovarian cysts.  EXAM: TRANSABDOMINAL AND TRANSVAGINAL ULTRASOUND OF PELVIS  TECHNIQUE: Both transabdominal and transvaginal ultrasound examinations of the pelvis were performed. Transabdominal technique was performed for global imaging of the pelvis including uterus, ovaries, adnexal regions, and pelvic cul-de-sac. It was necessary to proceed with endovaginal exam following the transabdominal exam to visualize the endometrium and ovaries.  COMPARISON:  CT on 03/13/12  FINDINGS: Uterus  Measurements: 9.3 x 6.9 x 8.5 cm. A fibroid is seen in the left uterine fundus measuring 5.2 x 4.5 x 4.8 cm.  Endometrium  Thickness: 11 mm.  No focal abnormality visualized.  Right ovary  Measurements: 2.5 x 1.7 x 2.4 cm. Normal appearance/no adnexal mass.  Left ovary  Measurements: 2.4 x 1.7 x 1.5 cm. Normal appearance/no adnexal mass.  Other findings  No free fluid.  IMPRESSION: 5.2 cm fibroid in left uterine fundus, without significant change.  Normal appearance of both ovaries.  No adnexal mass identified.   Electronically Signed   By: Earle Gell M.D.   On: 11/06/2014 18:42    MAU COURSE Low suspicion for appendicitis due to location, nature of pain and absence of fever, leukocytosis and GI complaints. Discussed with Dr. Nehemiah Settle who agrees w/ POC.   ASSESSMENT 1. Uterine leiomyoma, unspecified location  2. RLQ abdominal pain   3. History of ovarian cyst    PLAN Since etiology of pain is unclear, but does not appear to by Gyn CNM offered pt transfer to [facility] ED for further eval. Pt refuses.  Appendicitis precautions reviewed in detail. Handout given.  Discharge home in stable condition. GC/Chlam pending  Follow-up Information    Follow up  with [facility]    .   Why:  as needed if no improvement in 2-3 days.   Contact information:   [address] [place] [address] [phone]      Follow up with [facility].   Why:  As needed if symptoms worsen (fever, nausea, vomiting, loss of appetite.)   Contact information:   [place] [id number]        Medication List    ASK your doctor about these medications        albuterol 108 (90 BASE) MCG/ACT inhaler  Commonly known as:  VENTOLIN HFA  Inhale 2 puffs into the lungs every 6 (six) hours as needed. For shortness of breath     amitriptyline 50 MG tablet  Commonly known as:  ELAVIL  Take 1 tablet (50 mg total) by mouth at bedtime.     azithromycin 250 MG tablet  Commonly known as:  ZITHROMAX Z-PAK  Take as directed     benzonatate 100 MG capsule  Commonly known as:  TESSALON  TAKE ONE CAPSULE 3 TIMES DAILY AS NEEDED.     clindamycin 150 MG capsule  Commonly known as:  CLEOCIN  Take 1 capsule (150 mg total) by mouth every 6 (six) hours.     famotidine 20 MG tablet  Commonly known as:  PEPCID  Take 1 tablet (20 mg total) by mouth 2 (two) times daily.     fluticasone 50 MCG/ACT nasal spray  Commonly known as:  FLONASE  Place 2 sprays into both nostrils daily.     oxyCODONE-acetaminophen 5-325 MG per tablet  Commonly known as:  PERCOCET/ROXICET  Take 1 tablet by mouth every 6 (six) hours as needed for severe pain.     PRESCRIPTION MEDICATION  Take 0.5 tablets by mouth daily. Patient takes a half tablet for heart palpations        Manya Silvas, [place] 11/06/2014  6:49 PM

## 2014-11-09 LAB — HIV ANTIBODY (ROUTINE TESTING W REFLEX): HIV SCREEN 4TH GENERATION: NONREACTIVE

## 2014-11-09 LAB — GC/CHLAMYDIA PROBE AMP (~~LOC~~) NOT AT ARMC
CHLAMYDIA, DNA PROBE: NEGATIVE
NEISSERIA GONORRHEA: NEGATIVE

## 2014-11-27 ENCOUNTER — Telehealth: Payer: Self-pay | Admitting: *Deleted

## 2014-11-27 NOTE — Telephone Encounter (Signed)
"  monitor shows episode of SVT 150 bpm. Refer to G. Lovena Le (EP) to discuss further treatment. No changes in meds for now."  Pt has appointment with Dr. Lovena Le 12/30/14.

## 2014-12-03 ENCOUNTER — Encounter: Payer: Self-pay | Admitting: Internal Medicine

## 2014-12-03 ENCOUNTER — Ambulatory Visit: Payer: Medicaid Other | Attending: Internal Medicine | Admitting: Internal Medicine

## 2014-12-03 VITALS — BP 114/80 | HR 65 | Temp 98.0°F | Resp 16 | Wt 141.4 lb

## 2014-12-03 DIAGNOSIS — R42 Dizziness and giddiness: Secondary | ICD-10-CM | POA: Insufficient documentation

## 2014-12-03 DIAGNOSIS — K2100 Gastro-esophageal reflux disease with esophagitis, without bleeding: Secondary | ICD-10-CM

## 2014-12-03 DIAGNOSIS — R109 Unspecified abdominal pain: Secondary | ICD-10-CM | POA: Insufficient documentation

## 2014-12-03 DIAGNOSIS — R21 Rash and other nonspecific skin eruption: Secondary | ICD-10-CM | POA: Diagnosis not present

## 2014-12-03 MED ORDER — MECLIZINE HCL 25 MG PO TABS: 25.0000 mg | ORAL_TABLET | Freq: Three times a day (TID) | ORAL | Status: DC | PRN

## 2014-12-03 MED ORDER — FAMOTIDINE 20 MG PO TABS: 20.0000 mg | ORAL_TABLET | Freq: Every day | ORAL | Status: DC

## 2014-12-03 MED ORDER — HYDROCORTISONE 1 % EX CREA: 1.0000 "application " | TOPICAL_CREAM | Freq: Two times a day (BID) | CUTANEOUS | Status: DC

## 2014-12-03 MED ORDER — OMEPRAZOLE 20 MG PO CPDR: 20.0000 mg | DELAYED_RELEASE_CAPSULE | Freq: Every day | ORAL | Status: DC

## 2014-12-03 NOTE — Progress Notes (Signed)
MRN: [id number] Name: Katherine [name]  Sex: female Age: 44 y.o. DOB: 1971/03/12  Allergies: Morphine; Shellfish allergy; Codeine; Hydrocodone; Penicillins; and Tomato  Chief Complaint  Patient presents with  . Abdominal Pain    HPI: Patient is 44 y.o. female who has history of GERD, palpitation scomes today complaining of worsening reflux symptoms as per patient she is taking Pepcid 3 times a day, she also reported occasional headache and dizziness at that time she feels nauseous, denies any recent cold symptoms, ear symptoms, as per patient she had a heart monitor and she Is going to make a followup with her cardiologist, currently denies any palpitation,chest pain or shortness of breath.patient is also requesting cream for her rash on both arms.  Past Medical History  Diagnosis Date  . Pneumonia     with cavitation of left lower lobe  . Asthma   . Migraine   . Hot flashes   . Palpitations   . Chest pressure   . Anxiety   . Fibromyalgia   . Ovarian cyst     Past Surgical History  Procedure Laterality Date  . Tubal ligation  1993      Medication List       This list is accurate as of: 12/03/14 12:40 PM.  Always use your most recent med list.               albuterol 108 (90 BASE) MCG/ACT inhaler  Commonly known as:  VENTOLIN HFA  Inhale 2 puffs into the lungs every 6 (six) hours as needed. For shortness of breath     famotidine 20 MG tablet  Commonly known as:  PEPCID  Take 1 tablet (20 mg total) by mouth at bedtime.     fluticasone 50 MCG/ACT nasal spray  Commonly known as:  FLONASE  Place 2 sprays into both nostrils daily.     hydrocortisone cream 1 %  Apply 1 application topically 2 (two) times daily.     ketorolac 10 MG tablet  Commonly known as:  TORADOL  Take 1 tablet (10 mg total) by mouth every 6 (six) hours as needed.     meclizine 25 MG tablet  Commonly known as:  ANTIVERT  Take 1 tablet (25 mg total) by mouth 3 (three) times daily as needed  for dizziness or nausea.     omeprazole 20 MG capsule  Commonly known as:  PRILOSEC  Take 1 capsule (20 mg total) by mouth daily.     PRESCRIPTION MEDICATION  Take 0.5 tablets by mouth daily. Patient takes a half tablet for heart palpations        Meds ordered this encounter  Medications  . famotidine (PEPCID) 20 MG tablet    Sig: Take 1 tablet (20 mg total) by mouth at bedtime.    Dispense:  30 tablet    Refill:  2  . omeprazole (PRILOSEC) 20 MG capsule    Sig: Take 1 capsule (20 mg total) by mouth daily.    Dispense:  30 capsule    Refill:  3  . hydrocortisone cream 1 %    Sig: Apply 1 application topically 2 (two) times daily.    Dispense:  30 g    Refill:  1  . meclizine (ANTIVERT) 25 MG tablet    Sig: Take 1 tablet (25 mg total) by mouth 3 (three) times daily as needed for dizziness or nausea.    Dispense:  30 tablet    Refill:  1  Immunization History  Administered Date(s) Administered  . Influenza Whole 07/23/2009, 07/04/2010  . Pneumococcal Polysaccharide-23 07/23/2009  . Td 03/30/2008    Family History  Problem Relation Age of Onset  . Migraines Daughter   . Migraines Son   . Hypertension    . Hypertension Mother   . Hypertension Father   . Colon cancer Neg Hx   . Colon polyps Neg Hx   . Diabetes Neg Hx   . Heart disease Mother   . Heart disease Father   . Kidney disease Neg Hx   . Liver disease Neg Hx   . Breast cancer Sister 82    deceased at age 43  . Heart attack Neg Hx   . Stroke Neg Hx     History  Substance Use Topics  . Smoking status: Current Some Day Smoker -- 0.05 packs/day    Types: Cigarettes  . Smokeless tobacco: Never Used     Comment: form given 01-20-13  . Alcohol Use: No    Review of Systems   As noted in HPI  Filed Vitals:   12/03/14 1107  BP: 114/80  Pulse: 65  Temp: 98 F (36.7 C)  Resp: 16    Physical Exam  Physical Exam  Constitutional: She is oriented to person, place, and time. No distress.  HENT:   Both TMs visualized not congested  Eyes: EOM are normal. Pupils are equal, round, and reactive to light.  Cardiovascular: Normal rate and regular rhythm.   Pulmonary/Chest: Breath sounds normal. No respiratory distress. She has no wheezes. She has no rales.  Musculoskeletal: She exhibits no edema.  Neurological: She is alert and oriented to person, place, and time. She has normal reflexes. No cranial nerve deficit. Coordination normal.    CBC    Component Value Date/Time   WBC 5.3 11/06/2014 1733   RBC 4.65 11/06/2014 1733   HGB 12.6 11/06/2014 1733   HCT 38.2 11/06/2014 1733   PLT 202 11/06/2014 1733   MCV 82.2 11/06/2014 1733   LYMPHSABS 4.2* 03/23/2014 1441   MONOABS 0.5 03/23/2014 1441   EOSABS 0.2 03/23/2014 1441   BASOSABS 0.0 03/23/2014 1441    CMP     Component Value Date/Time   NA 139 11/06/2014 1733   K 4.4 11/06/2014 1733   CL 105 11/06/2014 1733   CO2 24 11/06/2014 1733   GLUCOSE 89 11/06/2014 1733   BUN 8 11/06/2014 1733   CREATININE 0.57 11/06/2014 1733   CREATININE 0.60 09/05/2013 1459   CALCIUM 9.3 11/06/2014 1733   PROT 7.2 11/06/2014 1733   ALBUMIN 4.1 11/06/2014 1733   AST 26 11/06/2014 1733   ALT 25 11/06/2014 1733   ALKPHOS 71 11/06/2014 1733   BILITOT 0.3 11/06/2014 1733   GFRNONAA >90 11/06/2014 1733   GFRNONAA >89 09/05/2013 1459   GFRAA >90 11/06/2014 1733   GFRAA >89 09/05/2013 1459    Lab Results  Component Value Date/Time   CHOL 145 09/05/2013 02:59 PM    No components found for: HGA1C  Lab Results  Component Value Date/Time   AST 26 11/06/2014 05:33 PM    Assessment and Plan  Gastroesophageal reflux disease with esophagitis - Plan: advised patient for lifestyle modification, prescribed her famotidine (PEPCID) 20 MG tablet take at night, and omeprazole (PRILOSEC) 20 MG capsule in the daytime.  Dizziness and giddiness - Plan: trial of meclizine (ANTIVERT) 25 MG tablet, patient is also going to follow with her cardiologist.  Since she has history of palpitations.  Rash and nonspecific skin eruption - Plan: hydrocortisone cream 1 %    Return in about 3 months (around 03/05/2015), or if symptoms worsen or fail to improve.  The patient was given clear instructions to go to ER or return to medical center if symptoms don't improve, worsen or new problems develop. The patient verbalized understanding.     This note has been created with Surveyor, quantity. Any transcriptional errors are unintentional.    Lorayne Marek, MD

## 2014-12-03 NOTE — Progress Notes (Signed)
Patient here for follow up Complains of epigastric pain Has been feeling nausea, dizzy and light headed Needs refills on her gerd medication

## 2014-12-30 ENCOUNTER — Ambulatory Visit (INDEPENDENT_AMBULATORY_CARE_PROVIDER_SITE_OTHER): Payer: Medicaid Other | Admitting: Internal Medicine

## 2014-12-30 ENCOUNTER — Encounter: Payer: Self-pay | Admitting: Internal Medicine

## 2014-12-30 VITALS — BP 106/60 | HR 60 | Ht 65.0 in | Wt 142.4 lb

## 2014-12-30 DIAGNOSIS — R55 Syncope and collapse: Secondary | ICD-10-CM

## 2014-12-30 DIAGNOSIS — I471 Supraventricular tachycardia: Secondary | ICD-10-CM

## 2014-12-30 DIAGNOSIS — I10 Essential (primary) hypertension: Secondary | ICD-10-CM | POA: Diagnosis not present

## 2014-12-30 NOTE — Assessment & Plan Note (Signed)
Her blood pressure is currently well-controlled. She will continue her current medical therapy, and maintain a low-sodium diet.

## 2014-12-30 NOTE — Patient Instructions (Signed)
Please call us when/if you are ready to schedule an ablation

## 2014-12-30 NOTE — Progress Notes (Signed)
HPI [name] returns today for followup. She is a pleasant 44 yo woman with a history of tachycardia palpitations, who has had documented SVT, at rates of over 150 bpm. When she has SVT, she feels bad, and notes near syncope, chest pressure, and shortness of breath. The episodes start and stop suddenly. She has been treated with metoprolol, and felt fatigue, but has had no improvement in her SVT. She has never had syncope. Allergies  Allergen Reactions  . Morphine Anaphylaxis, Hives and Swelling  . Shellfish Allergy Anaphylaxis  . Codeine Hives  . Hydrocodone Hives  . Penicillins Other (See Comments)    Yeast infection  . Tomato Rash     Current Outpatient Prescriptions  Medication Sig Dispense Refill  . albuterol (VENTOLIN HFA) 108 (90 BASE) MCG/ACT inhaler Inhale 2 puffs into the lungs every 6 (six) hours as needed. For shortness of breath 1 each 3  . famotidine (PEPCID) 20 MG tablet Take 1 tablet (20 mg total) by mouth at bedtime. 30 tablet 2  . fluticasone (FLONASE) 50 MCG/ACT nasal spray Place 2 sprays into both nostrils daily. 16 g 3  . hydrocortisone cream 1 % Apply 1 application topically 2 (two) times daily. 30 g 1  . ketorolac (TORADOL) 10 MG tablet Take 1 tablet (10 mg total) by mouth every 6 (six) hours as needed. 20 tablet 0  . meclizine (ANTIVERT) 25 MG tablet Take 1 tablet (25 mg total) by mouth 3 (three) times daily as needed for dizziness or nausea. 30 tablet 1  . omeprazole (PRILOSEC) 20 MG capsule Take 1 capsule (20 mg total) by mouth daily. 30 capsule 3  . PRESCRIPTION MEDICATION Take 0.5 tablets by mouth daily. Patient takes a half tablet for heart palpations    . [DISCONTINUED] diltiazem (CARDIZEM SR) 120 MG 12 hr capsule Take 1 capsule (120 mg total) by mouth daily. 30 capsule 6   No current facility-administered medications for this visit.     Past Medical History  Diagnosis Date  . Pneumonia     with cavitation of left lower lobe  . Asthma   .  Migraine   . Hot flashes   . Palpitations   . Chest pressure   . Anxiety   . Fibromyalgia   . Ovarian cyst     ROS:   All systems reviewed and negative except as noted in the HPI.   Past Surgical History  Procedure Laterality Date  . Tubal ligation  1993     Family History  Problem Relation Age of Onset  . Migraines Daughter   . Migraines Son   . Hypertension    . Hypertension Mother   . Hypertension Father   . Colon cancer Neg Hx   . Colon polyps Neg Hx   . Diabetes Neg Hx   . Heart disease Mother   . Heart disease Father   . Kidney disease Neg Hx   . Liver disease Neg Hx   . Breast cancer Sister 3    deceased at age 24  . Heart attack Neg Hx   . Stroke Neg Hx      History   Social History  . Marital Status: Married    Spouse Name: N/A  . Number of Children: N/A  . Years of Education: N/A   Occupational History  . Not on file.   Social History Main Topics  . Smoking status: Current Some Day Smoker -- 0.05 packs/day    Types:  Cigarettes  . Smokeless tobacco: Never Used     Comment: form given 01-20-13  . Alcohol Use: No  . Drug Use: No     Comment: pt states she has d/c marijuana  . Sexual Activity: Yes    Birth Control/ Protection: Surgical   Other Topics Concern  . Not on file   Social History Narrative   On disability for heart condition.  Pt. Was non-specific.      BP 106/60 mmHg  Pulse 60  Ht 5\' 5"  (1.651 m)  Wt 142 lb 6.4 oz (64.592 kg)  BMI 23.70 kg/m2  Physical Exam:  Well appearing 44 year old woman, NAD HEENT: Unremarkable Neck:  No JVD, no thyromegally Lymphatics:  No adenopathy Back:  No CVA tenderness Lungs:  Clear, with no wheezes, rales, or rhonchi.  HEART:  Regular rate rhythm, no murmurs, no rubs, no clicks Abd:  soft, positive bowel sounds, no organomegally, no rebound, no guarding Ext:  2 plus pulses, no edema, no cyanosis, no clubbing Skin:  No rashes no nodules Neuro:  CN II through XII intact, motor grossly  intact  Review of EKG - normal sinus rhythm, with no preexcitation.  Assess/Plan:

## 2014-12-30 NOTE — Assessment & Plan Note (Signed)
The etiology is unclear. She could have posttermination pauses from her SVT. She will undergo watchful waiting.

## 2014-12-30 NOTE — Assessment & Plan Note (Signed)
I discussed the treatment options with the patient. She has failed medical therapy. The risk, goals, benefits, and expectations of catheter ablation of SVT have been reviewed with the patient and her husband. They will consider their options, and call us if she would prefer to proceed with SVT ablation.

## 2015-01-04 ENCOUNTER — Encounter: Payer: Self-pay | Admitting: Internal Medicine

## 2015-01-04 ENCOUNTER — Ambulatory Visit (INDEPENDENT_AMBULATORY_CARE_PROVIDER_SITE_OTHER): Payer: Medicaid Other | Admitting: Internal Medicine

## 2015-01-04 VITALS — BP 114/64 | HR 57 | Ht 65.0 in | Wt 143.8 lb

## 2015-01-04 DIAGNOSIS — I471 Supraventricular tachycardia: Secondary | ICD-10-CM

## 2015-01-04 NOTE — Progress Notes (Signed)
Cardiology Office Note   Date:  01/04/2015   ID:  Katherine [name], DOB 1971/02/19, MRN [id number]  PCP:  Katherine Marek, MD  Cardiologist:   Katherine Carnes, MD   No chief complaint on file.     History of Present Illness: Katherine [name] is a 44 y.o. female with a history palpitations I saw her back in Jan SHe wore monitor that showed SVT  She was referred to Katherine [name] for evaluation He discussed Rx option of ablation.  The patient has not had any signif recurrent spells  She says she is not ready for ablation.    Doing well otherwise  Denies syncope       Current Outpatient Prescriptions  Medication Sig Dispense Refill  . albuterol (VENTOLIN HFA) 108 (90 BASE) MCG/ACT inhaler Inhale 2 puffs into the lungs every 6 (six) hours as needed. For shortness of breath 1 each 3  . famotidine (PEPCID) 20 MG tablet Take 1 tablet (20 mg total) by mouth at bedtime. 30 tablet 2  . fluticasone (FLONASE) 50 MCG/ACT nasal spray Place 2 sprays into both nostrils daily. 16 g 3  . hydrocortisone cream 1 % Apply 1 application topically 2 (two) times daily. 30 g 1  . ketorolac (TORADOL) 10 MG tablet Take 1 tablet (10 mg total) by mouth every 6 (six) hours as needed. 20 tablet 0  . meclizine (ANTIVERT) 25 MG tablet Take 1 tablet (25 mg total) by mouth 3 (three) times daily as needed for dizziness or nausea. 30 tablet 1  . omeprazole (PRILOSEC) 20 MG capsule Take 1 capsule (20 mg total) by mouth daily. 30 capsule 3  . PRESCRIPTION MEDICATION Take 0.5 tablets by mouth daily. Patient takes a half tablet for heart palpations    . [DISCONTINUED] diltiazem (CARDIZEM SR) 120 MG 12 hr capsule Take 1 capsule (120 mg total) by mouth daily. 30 capsule 6   No current facility-administered medications for this visit.    Allergies:   Morphine; Shellfish allergy; Codeine; Hydrocodone; Penicillins; and Tomato   Past Medical History  Diagnosis Date  . Pneumonia     with cavitation of left lower lobe  . Asthma   .  Migraine   . Hot flashes   . Palpitations   . Chest pressure   . Anxiety   . Fibromyalgia   . Ovarian cyst     Past Surgical History  Procedure Laterality Date  . Tubal ligation  1993     Social History:  The patient  reports that she has been smoking Cigarettes.  She has been smoking about 0.05 packs per day. She has never used smokeless tobacco. She reports that she does not drink alcohol or use illicit drugs.   Family History:  The patient's family history includes Breast cancer (age of onset: 42) in her sister; Heart disease in her father and mother; Hypertension in her father, mother, and another family member; Migraines in her daughter and son. There is no history of Colon cancer, Colon polyps, Diabetes, Kidney disease, Liver disease, Heart attack, or Stroke.    ROS:  Please see the history of present illness. All other systems are reviewed and  Negative to the above problem except as noted.    PHYSICAL EXAM: VS:  BP 114/64 mmHg  Pulse 57  Ht 5\' 5"  (1.651 m)  Wt 143 lb 12.8 oz (65.227 kg)  BMI 23.93 kg/m2  GEN: Well nourished, well developed, in no acute distress HEENT: normal Neck: no JVD,  carotid bruits, or masses Cardiac: RRR; no murmurs, rubs, or gallops,no edema  Respiratory:  clear to auscultation bilaterally, normal work of breathing GI: soft, nontender, nondistended, + BS  No hepatomegaly  MS: no deformity Moving all extremities   Skin: warm and dry, no rash Neuro:  Strength and sensation are intact Psych: euthymic mood, full affect   EKG:  EKG is not ordered today.   Lipid Panel    Component Value Date/Time   CHOL 145 09/05/2013 1459   TRIG 76 09/05/2013 1459   HDL 46 09/05/2013 1459   CHOLHDL 3.2 09/05/2013 1459   VLDL 15 09/05/2013 1459   LDLCALC 84 09/05/2013 1459      Wt Readings from Last 3 Encounters:  01/04/15 143 lb 12.8 oz (65.227 kg)  12/30/14 142 lb 6.4 oz (64.592 kg)  12/03/14 141 lb 6.4 oz (64.139 kg)      ASSESSMENT AND  PLAN:    1.  SVT  Reivewed with patient  She will reflect, notify for recurrences  Avoid stimulants       Disposition:   FU with me in November    Signed, Katherine Carnes, MD  01/04/2015 11:13 AM    [name], [place], [place]  [address] Phone: [phone]; Fax: [phone]

## 2015-01-04 NOTE — Patient Instructions (Signed)
Your physician recommends that you continue on your current medications as directed. Please refer to the Current Medication list given to you today. Your physician wants you to follow-up in: November with Dr. Ross.  You will receive a reminder letter in the mail two months in advance. If you don't receive a letter, please call our office to schedule the follow-up appointment.  

## 2015-03-26 ENCOUNTER — Encounter (HOSPITAL_COMMUNITY): Payer: Self-pay | Admitting: Physical Medicine and Rehabilitation

## 2015-03-26 ENCOUNTER — Emergency Department (HOSPITAL_COMMUNITY)
Admission: EM | Admit: 2015-03-26 | Discharge: 2015-03-26 | Disposition: A | Discharge status: Home / Self Care | Payer: Medicaid Other | Attending: Emergency Medicine | Admitting: Emergency Medicine

## 2015-03-26 DIAGNOSIS — R001 Bradycardia, unspecified: Secondary | ICD-10-CM | POA: Diagnosis not present

## 2015-03-26 DIAGNOSIS — Z720 Tobacco use: Secondary | ICD-10-CM | POA: Diagnosis not present

## 2015-03-26 DIAGNOSIS — Z8739 Personal history of other diseases of the musculoskeletal system and connective tissue: Secondary | ICD-10-CM | POA: Insufficient documentation

## 2015-03-26 DIAGNOSIS — Z8742 Personal history of other diseases of the female genital tract: Secondary | ICD-10-CM | POA: Diagnosis not present

## 2015-03-26 DIAGNOSIS — Z79899 Other long term (current) drug therapy: Secondary | ICD-10-CM | POA: Diagnosis not present

## 2015-03-26 DIAGNOSIS — R42 Dizziness and giddiness: Secondary | ICD-10-CM

## 2015-03-26 DIAGNOSIS — G43909 Migraine, unspecified, not intractable, without status migrainosus: Secondary | ICD-10-CM

## 2015-03-26 DIAGNOSIS — Z7951 Long term (current) use of inhaled steroids: Secondary | ICD-10-CM | POA: Diagnosis not present

## 2015-03-26 DIAGNOSIS — Z8659 Personal history of other mental and behavioral disorders: Secondary | ICD-10-CM | POA: Diagnosis not present

## 2015-03-26 DIAGNOSIS — Z8701 Personal history of pneumonia (recurrent): Secondary | ICD-10-CM | POA: Insufficient documentation

## 2015-03-26 DIAGNOSIS — J45909 Unspecified asthma, uncomplicated: Secondary | ICD-10-CM | POA: Insufficient documentation

## 2015-03-26 DIAGNOSIS — R51 Headache: Secondary | ICD-10-CM | POA: Diagnosis present

## 2015-03-26 MED ORDER — METOCLOPRAMIDE HCL 5 MG/ML IJ SOLN
10.0000 mg | Freq: Once | INTRAMUSCULAR | Status: AC
  Administered 2015-03-26: 10 mg via INTRAVENOUS
  Filled 2015-03-26: qty 2

## 2015-03-26 MED ORDER — DIPHENHYDRAMINE HCL 50 MG/ML IJ SOLN
25.0000 mg | Freq: Once | INTRAMUSCULAR | Status: AC
  Administered 2015-03-26: 25 mg via INTRAVENOUS
  Filled 2015-03-26: qty 1

## 2015-03-26 MED ORDER — SODIUM CHLORIDE 0.9 % IV BOLUS (SEPSIS)
1000.0000 mL | Freq: Once | INTRAVENOUS | Status: AC
  Administered 2015-03-26: 1000 mL via INTRAVENOUS

## 2015-03-26 MED ORDER — KETOROLAC TROMETHAMINE 30 MG/ML IJ SOLN
30.0000 mg | Freq: Once | INTRAMUSCULAR | Status: AC
  Administered 2015-03-26: 30 mg via INTRAVENOUS
  Filled 2015-03-26: qty 1

## 2015-03-26 NOTE — ED Notes (Signed)
Pt presents to department for evaluation of headache. Onset Thursday evening. 8/10 pain upon arrival to ED. Also states nausea and blurred vision. Pt is alert and oriented x4. Ambulatory to exam room without difficulty.

## 2015-03-26 NOTE — ED Provider Notes (Signed)
CSN: [id number]     Arrival date & time 03/26/15  0800 History   First MD Initiated Contact with Patient 03/26/15 [phone]     Chief Complaint  Patient presents with  . Headache     (Consider location/radiation/quality/duration/timing/severity/associated sxs/prior Treatment) HPI Comments: Katherine [name] is a 44 y.o. female with a PMHx of asthma, chronic migraines, anxiety, fibromyalgia, and ovarian cysts, who presents to the ED with complaints of gradual onset headache that began yesterday. She reports that the headache is 10/10 intermittent sharp and pressure-like, generalized, nonradiating, worse with noise and lights, and unrelieved with Advil. She reports is not similar to her migraines in that is more intermittent rather than constant. She endorses associated lightheadedness with standing. She denies any fevers, chills, chest pain, shortness breath, abdominal pain, nausea, vomiting, diarrhea, constipation, dysuria, hematuria, vaginal bleeding or discharge, numbness, tingling, weakness, vision changes, or vertigo. LMP 1wk ago  Patient is a 44 y.o. female presenting with headaches. The history is provided by the patient. No language interpreter was used.  Headache Pain location:  Generalized Quality:  Sharp (and pressure like) Radiates to:  Does not radiate Severity currently:  10/10 Severity at highest:  10/10 Onset quality:  Gradual Duration:  1 day Timing:  Intermittent Progression:  Unchanged Chronicity:  Recurrent Similar to prior headaches: no   Context: bright light   Relieved by:  Nothing Worsened by:  Light and sound Ineffective treatments:  NSAIDs Associated symptoms: photophobia   Associated symptoms: no abdominal pain, no blurred vision, no diarrhea, no fever, no focal weakness, no loss of balance, no myalgias, no nausea, no neck stiffness, no numbness, no paresthesias, no tingling, no visual change, no vomiting and no weakness     Past Medical History  Diagnosis Date  .  Pneumonia     with cavitation of left lower lobe  . Asthma   . Migraine   . Hot flashes   . Palpitations   . Chest pressure   . Anxiety   . Fibromyalgia   . Ovarian cyst    Past Surgical History  Procedure Laterality Date  . Tubal ligation  1993   Family History  Problem Relation Age of Onset  . Migraines Daughter   . Migraines Son   . Hypertension    . Hypertension Mother   . Hypertension Father   . Colon cancer Neg Hx   . Colon polyps Neg Hx   . Diabetes Neg Hx   . Heart disease Mother   . Heart disease Father   . Kidney disease Neg Hx   . Liver disease Neg Hx   . Breast cancer Sister 37    deceased at age 68  . Heart attack Neg Hx   . Stroke Neg Hx    History  Substance Use Topics  . Smoking status: Current Some Day Smoker -- 0.05 packs/day    Types: Cigarettes  . Smokeless tobacco: Never Used     Comment: form given 01-20-13  . Alcohol Use: No   OB History    Gravida Para Term Preterm AB TAB SAB Ectopic Multiple Living   2 2 1 1      2      Review of Systems  Constitutional: Negative for fever and chills.  Eyes: Positive for photophobia. Negative for blurred vision and visual disturbance.  Respiratory: Negative for shortness of breath.   Cardiovascular: Negative for chest pain.  Gastrointestinal: Negative for nausea, vomiting, abdominal pain, diarrhea and constipation.  Genitourinary: Negative for  dysuria, hematuria, vaginal bleeding and vaginal discharge.  Musculoskeletal: Negative for myalgias, arthralgias and neck stiffness.  Skin: Negative for color change.  Allergic/Immunologic: Negative for immunocompromised state.  Neurological: Positive for light-headedness (only with standing) and headaches. Negative for focal weakness, syncope, weakness, numbness, paresthesias and loss of balance.  Psychiatric/Behavioral: Negative for confusion.   10 Systems reviewed and are negative for acute change except as noted in the HPI.    Allergies  Morphine;  Shellfish allergy; Codeine; Hydrocodone; Penicillins; and Tomato  Home Medications   Prior to Admission medications   Medication Sig Start Date End Date Taking? Authorizing Provider  albuterol (VENTOLIN HFA) 108 (90 BASE) MCG/ACT inhaler Inhale 2 puffs into the lungs every 6 (six) hours as needed. For shortness of breath 07/13/14   Lorayne Marek, MD  famotidine (PEPCID) 20 MG tablet Take 1 tablet (20 mg total) by mouth at bedtime. 12/03/14   Lorayne Marek, MD  fluticasone (FLONASE) 50 MCG/ACT nasal spray Place 2 sprays into both nostrils daily. 07/13/14   Lorayne Marek, MD  hydrocortisone cream 1 % Apply 1 application topically 2 (two) times daily. 12/03/14   Lorayne Marek, MD  ketorolac (TORADOL) 10 MG tablet Take 1 tablet (10 mg total) by mouth every 6 (six) hours as needed. 11/06/14   Manya Silvas, CNM  meclizine (ANTIVERT) 25 MG tablet Take 1 tablet (25 mg total) by mouth 3 (three) times daily as needed for dizziness or nausea. 12/03/14   Lorayne Marek, MD  omeprazole (PRILOSEC) 20 MG capsule Take 1 capsule (20 mg total) by mouth daily. 12/03/14   Lorayne Marek, MD  PRESCRIPTION MEDICATION Take 0.5 tablets by mouth daily. Patient takes a half tablet for heart palpations    Historical Provider, MD   BP 134/82 mmHg  Pulse 51  Temp(Src) 97.9 F (36.6 C) (Oral)  Resp 16  SpO2 100% Physical Exam  Constitutional: She is oriented to person, place, and time. Vital signs are normal. She appears well-developed and well-nourished.  Non-toxic appearance. No distress.  Afebrile, nontoxic, NAD, laughing in room with husband  HENT:  Head: Normocephalic and atraumatic.  Mouth/Throat: Oropharynx is clear and moist and mucous membranes are normal.  Eyes: Conjunctivae and EOM are normal. Pupils are equal, round, and reactive to light. Right eye exhibits no discharge. Left eye exhibits no discharge.  PERRL, EOMI, no nystagmus, no visual field deficits   Neck: Normal range of motion. Neck supple. No spinous  process tenderness and no muscular tenderness present. No rigidity. Normal range of motion present.  FROM intact without spinous process TTP, no bony stepoffs or deformities, no paraspinous muscle TTP or muscle spasms. No rigidity or meningeal signs. No bruising or swelling.   Cardiovascular: Regular rhythm, normal heart sounds and intact distal pulses.  Bradycardia present.  Exam reveals no gallop and no friction rub.   No murmur heard. Mild bradycardia, HR 50-60s, reg rhythm, nl s1/s2, no m/r/g  Pulmonary/Chest: Effort normal and breath sounds normal. No respiratory distress. She has no decreased breath sounds. She has no wheezes. She has no rhonchi. She has no rales.  Abdominal: Soft. Normal appearance and bowel sounds are normal. She exhibits no distension. There is no tenderness. There is no rigidity, no rebound, no guarding, no CVA tenderness, no tenderness at McBurney's point and negative Murphy's sign.  Musculoskeletal: Normal range of motion.  MAE x4 Strength and sensation grossly intact Distal pulses intact  Neurological: She is alert and oriented to person, place, and time. She has normal strength.  No cranial nerve deficit or sensory deficit. She displays a negative Romberg sign. Coordination and gait normal. GCS eye subscore is 4. GCS verbal subscore is 5. GCS motor subscore is 6.  CN 2-12 grossly intact A&O x4 GCS 15 Sensation and strength intact Gait nonataxic including with tandem walking Coordination with finger-to-nose WNL Neg romberg, neg pronator drift   Skin: Skin is warm, dry and intact. No rash noted.  Psychiatric: She has a normal mood and affect.  Nursing note and vitals reviewed.   ED Course  Procedures (including critical care time) 08:25 Orthostatic Vital Signs NB  Orthostatic Lying  - BP- Lying: 109/75 mmHg ; Pulse- Lying: 52  Orthostatic Sitting - BP- Sitting: 112/85 mmHg ; Pulse- Sitting: 60  Orthostatic Standing at 0 minutes - BP- Standing at 0 minutes:  116/86 mmHg ; Pulse- Standing at 0 minutes: 63      Labs Review Labs Reviewed - No data to display  Imaging Review No results found.   EKG Interpretation None      MDM   Final diagnoses:  Migraine without status migrainosus, not intractable, unspecified migraine type  Positional lightheadedness    44 y.o. female here with migraine. States this isn't the same as her typical migraines in that it's more intermittent. Nonfocal neuro exam, gait steady, no meningismus. Doubt SAH/pseudotumor/CVA or other emergent secondary cause. Reported nausea and vision changes to nursing, but denies this to me. Will give migraine cocktail and get orthostatic VS since she complains of lightheadedness with standing, and then reassess.  9:37 AM Orthostatics not revealing any significant orthostasis. Pt feeling improved after migraine cocktail. Discussed tylenol/motrin as needed for HA, and f/up with PCP in 5-7 days for recheck. Discussed that she may benefit from migraine prophylaxis. I explained the diagnosis and have given explicit precautions to return to the ER including for any other new or worsening symptoms. The patient understands and accepts the medical plan as it's been dictated and I have answered their questions. Discharge instructions concerning home care and prescriptions have been given. The patient is STABLE and is discharged to home in good condition.  BP 134/82 mmHg  Pulse 51  Temp(Src) 97.9 F (36.6 C) (Oral)  Resp 16  SpO2 100%  Meds ordered this encounter  Medications  . metoCLOPramide (REGLAN) injection 10 mg    Sig:    And  . diphenhydrAMINE (BENADRYL) injection 25 mg    Sig:    And  . sodium chloride 0.9 % bolus 1,000 mL    Sig:    And  . ketorolac (TORADOL) 30 MG/ML injection 30 mg    Sig:      Jaquelinne Glendening Camprubi-Soms, PA-C 03/26/15 0940  Charlesetta Shanks, MD 03/26/15 1047

## 2015-03-26 NOTE — Discharge Instructions (Signed)
Use tylenol and motrin as needed for headaches. Avoid known triggers and stay well hydrated. Follow up with your regular doctor in 4-5 days for recheck of symptoms and to discuss migraine prevention therapies. Return to the ER for changes or worsening symptoms.   Migraine Headache A migraine headache is very bad, throbbing pain on one or both sides of your head. Talk to your doctor about what things may bring on (trigger) your migraine headaches. HOME CARE  Only take medicines as told by your doctor.  Lie down in a dark, quiet room when you have a migraine.  Keep a journal to find out if certain things bring on migraine headaches. For example, write down:  What you eat and drink.  How much sleep you get.  Any change to your diet or medicines.  Lessen how much alcohol you drink.  Quit smoking if you smoke.  Get enough sleep.  Lessen any stress in your life.  Keep lights dim if bright lights bother you or make your migraines worse. GET HELP RIGHT AWAY IF:   Your migraine becomes really bad.  You have a fever.  You have a stiff neck.  You have trouble seeing.  Your muscles are weak, or you lose muscle control.  You lose your balance or have trouble walking.  You feel like you will pass out (faint), or you pass out.  You have really bad symptoms that are different than your first symptoms. MAKE SURE YOU:   Understand these instructions.  Will watch your condition.  Will get help right away if you are not doing well or get worse. Document Released: 06/20/2008 Document Revised: 12/04/2011 Document Reviewed: 05/19/2013 [facility] Patient Information 2015 [place], [place]. This information is not intended to replace advice given to you by your health care provider. Make sure you discuss any questions you have with your health care provider.

## 2015-03-26 NOTE — ED Notes (Signed)
Pt reports severe headache, onset Thursday evening. 8/10 pain upon arrival, reports nausea and blurred vision. Pt is alert and oriented x4. No neurological deficits noted.

## 2015-04-18 ENCOUNTER — Emergency Department (HOSPITAL_COMMUNITY)
Admission: EM | Admit: 2015-04-18 | Discharge: 2015-04-18 | Disposition: A | Discharge status: Home / Self Care | Payer: Medicaid Other | Attending: Emergency Medicine | Admitting: Emergency Medicine

## 2015-04-18 ENCOUNTER — Encounter (HOSPITAL_COMMUNITY): Payer: Self-pay | Admitting: Emergency Medicine

## 2015-04-18 ENCOUNTER — Emergency Department (HOSPITAL_COMMUNITY): Payer: Medicaid Other

## 2015-04-18 DIAGNOSIS — R079 Chest pain, unspecified: Secondary | ICD-10-CM | POA: Insufficient documentation

## 2015-04-18 DIAGNOSIS — Z3202 Encounter for pregnancy test, result negative: Secondary | ICD-10-CM | POA: Diagnosis not present

## 2015-04-18 DIAGNOSIS — Z880 Allergy status to penicillin: Secondary | ICD-10-CM | POA: Insufficient documentation

## 2015-04-18 DIAGNOSIS — Z720 Tobacco use: Secondary | ICD-10-CM | POA: Diagnosis not present

## 2015-04-18 DIAGNOSIS — J45901 Unspecified asthma with (acute) exacerbation: Secondary | ICD-10-CM | POA: Diagnosis not present

## 2015-04-18 DIAGNOSIS — M549 Dorsalgia, unspecified: Secondary | ICD-10-CM | POA: Insufficient documentation

## 2015-04-18 DIAGNOSIS — Z79899 Other long term (current) drug therapy: Secondary | ICD-10-CM | POA: Insufficient documentation

## 2015-04-18 DIAGNOSIS — Z8659 Personal history of other mental and behavioral disorders: Secondary | ICD-10-CM | POA: Diagnosis not present

## 2015-04-18 DIAGNOSIS — Z8709 Personal history of other diseases of the respiratory system: Secondary | ICD-10-CM | POA: Diagnosis not present

## 2015-04-18 DIAGNOSIS — Z7951 Long term (current) use of inhaled steroids: Secondary | ICD-10-CM | POA: Diagnosis not present

## 2015-04-18 DIAGNOSIS — Z8742 Personal history of other diseases of the female genital tract: Secondary | ICD-10-CM | POA: Insufficient documentation

## 2015-04-18 DIAGNOSIS — R0781 Pleurodynia: Secondary | ICD-10-CM | POA: Insufficient documentation

## 2015-04-18 DIAGNOSIS — Z8701 Personal history of pneumonia (recurrent): Secondary | ICD-10-CM | POA: Insufficient documentation

## 2015-04-18 LAB — D-DIMER, QUANTITATIVE (NOT AT ARMC): D-Dimer, Quant: 0.32 ug/mL-FEU (ref 0.00–0.48)

## 2015-04-18 LAB — HEPATIC FUNCTION PANEL
ALK PHOS: 61 U/L (ref 38–126)
ALT: 13 U/L — AB (ref 14–54)
AST: 18 U/L (ref 15–41)
Albumin: 3.8 g/dL (ref 3.5–5.0)
Bilirubin, Direct: 0.1 mg/dL (ref 0.1–0.5)
Indirect Bilirubin: 0.4 mg/dL (ref 0.3–0.9)
Total Bilirubin: 0.5 mg/dL (ref 0.3–1.2)
Total Protein: 6.8 g/dL (ref 6.5–8.1)

## 2015-04-18 LAB — BASIC METABOLIC PANEL
Anion gap: 4 — ABNORMAL LOW (ref 5–15)
BUN: 7 mg/dL (ref 6–20)
CO2: 26 mmol/L (ref 22–32)
Calcium: 9.1 mg/dL (ref 8.9–10.3)
Chloride: 108 mmol/L (ref 101–111)
Creatinine, Ser: 0.58 mg/dL (ref 0.44–1.00)
Glucose, Bld: 87 mg/dL (ref 65–99)
POTASSIUM: 4.3 mmol/L (ref 3.5–5.1)
Sodium: 138 mmol/L (ref 135–145)

## 2015-04-18 LAB — CBC WITH DIFFERENTIAL/PLATELET
Basophils Absolute: 0 10*3/uL (ref 0.0–0.1)
Basophils Relative: 0 % (ref 0–1)
Eosinophils Absolute: 0.2 10*3/uL (ref 0.0–0.7)
Eosinophils Relative: 3 % (ref 0–5)
HCT: 48.5 % — ABNORMAL HIGH (ref 36.0–46.0)
HEMOGLOBIN: 12.9 g/dL (ref 12.0–15.0)
LYMPHS ABS: 3.1 10*3/uL (ref 0.7–4.0)
LYMPHS PCT: 52 % — AB (ref 12–46)
MCH: 27.9 pg (ref 26.0–34.0)
MCHC: 26.6 g/dL — ABNORMAL LOW (ref 30.0–36.0)
MCV: 105 fL — ABNORMAL HIGH (ref 78.0–100.0)
Monocytes Absolute: 0.4 10*3/uL (ref 0.1–1.0)
Monocytes Relative: 7 % (ref 3–12)
NEUTROS PCT: 38 % — AB (ref 43–77)
Neutro Abs: 2.3 10*3/uL (ref 1.7–7.7)
Platelets: 180 10*3/uL (ref 150–400)
RBC: 4.62 MIL/uL (ref 3.87–5.11)
RDW: 15.6 % — ABNORMAL HIGH (ref 11.5–15.5)
WBC: 5.9 10*3/uL (ref 4.0–10.5)

## 2015-04-18 LAB — POC URINE PREG, ED: Preg Test, Ur: NEGATIVE

## 2015-04-18 LAB — URINALYSIS, ROUTINE W REFLEX MICROSCOPIC
BILIRUBIN URINE: NEGATIVE
Glucose, UA: NEGATIVE mg/dL
KETONES UR: NEGATIVE mg/dL
Leukocytes, UA: NEGATIVE
Nitrite: NEGATIVE
Protein, ur: NEGATIVE mg/dL
Specific Gravity, Urine: 1.02 (ref 1.005–1.030)
Urobilinogen, UA: 1 mg/dL (ref 0.0–1.0)
pH: 7.5 (ref 5.0–8.0)

## 2015-04-18 LAB — TROPONIN I

## 2015-04-18 LAB — LIPASE, BLOOD: LIPASE: 25 U/L (ref 22–51)

## 2015-04-18 LAB — URINE MICROSCOPIC-ADD ON

## 2015-04-18 MED ORDER — TRAMADOL HCL 50 MG PO TABS: 50.0000 mg | ORAL_TABLET | Freq: Four times a day (QID) | ORAL | Status: DC | PRN

## 2015-04-18 MED ORDER — OXYCODONE-ACETAMINOPHEN 5-325 MG PO TABS
1.0000 | ORAL_TABLET | Freq: Once | ORAL | Status: AC
  Administered 2015-04-18: 1 via ORAL
  Filled 2015-04-18: qty 1

## 2015-04-18 MED ORDER — METHOCARBAMOL 500 MG PO TABS: 500.0000 mg | ORAL_TABLET | Freq: Two times a day (BID) | ORAL | Status: DC

## 2015-04-18 NOTE — ED Notes (Signed)
Pt ambulated to the bathroom with stand by assistance.

## 2015-04-18 NOTE — Discharge Instructions (Signed)
Take ibuprofen or aleve for pain. Tramadol for severe pain. Robaxin for muscle spasms. Try heating pad. Stretch. Follow up with primary care doctor if not improving.     Chest Wall Pain Chest wall pain is pain in or around the bones and muscles of your chest. It may take up to 6 weeks to get better. It may take longer if you must stay physically active in your work and activities.  CAUSES  Chest wall pain may happen on its own. However, it may be caused by:  A viral illness like the flu.  Injury.  Coughing.  Exercise.  Arthritis.  Fibromyalgia.  Shingles. HOME CARE INSTRUCTIONS   Avoid overtiring physical activity. Try not to strain or perform activities that cause pain. This includes any activities using your chest or your abdominal and side muscles, especially if heavy weights are used.  Put ice on the sore area.  Put ice in a plastic bag.  Place a towel between your skin and the bag.  Leave the ice on for 15-20 minutes per hour while awake for the first 2 days.  Only take over-the-counter or prescription medicines for pain, discomfort, or fever as directed by your caregiver. SEEK IMMEDIATE MEDICAL CARE IF:   Your pain increases, or you are very uncomfortable.  You have a fever.  Your chest pain becomes worse.  You have new, unexplained symptoms.  You have nausea or vomiting.  You feel sweaty or lightheaded.  You have a cough with phlegm (sputum), or you cough up blood. MAKE SURE YOU:   Understand these instructions.  Will watch your condition.  Will get help right away if you are not doing well or get worse. Document Released: 09/11/2005 Document Revised: 12/04/2011 Document Reviewed: 05/08/2011 [facility] Patient Information 2015 [place], [place]. This information is not intended to replace advice given to you by your health care provider. Make sure you discuss any questions you have with your health care provider.  Muscle Cramps and Spasms Muscle cramps  and spasms occur when a muscle or muscles tighten and you have no control over this tightening (involuntary muscle contraction). They are a common problem and can develop in any muscle. The most common place is in the calf muscles of the leg. Both muscle cramps and muscle spasms are involuntary muscle contractions, but they also have differences:   Muscle cramps are sporadic and painful. They may last a few seconds to a quarter of an hour. Muscle cramps are often more forceful and last longer than muscle spasms.  Muscle spasms may or may not be painful. They may also last just a few seconds or much longer. CAUSES  It is uncommon for cramps or spasms to be due to a serious underlying problem. In many cases, the cause of cramps or spasms is unknown. Some common causes are:   Overexertion.   Overuse from repetitive motions (doing the same thing over and over).   Remaining in a certain position for a long period of time.   Improper preparation, form, or technique while performing a sport or activity.   Dehydration.   Injury.   Side effects of some medicines.   Abnormally low levels of the salts and ions in your blood (electrolytes), especially potassium and calcium. This could happen if you are taking water pills (diuretics) or you are pregnant.  Some underlying medical problems can make it more likely to develop cramps or spasms. These include, but are not limited to:   Diabetes.   Parkinson  disease.   Hormone disorders, such as thyroid problems.   Alcohol abuse.   Diseases specific to muscles, joints, and bones.   Blood vessel disease where not enough blood is getting to the muscles.  HOME CARE INSTRUCTIONS   Stay well hydrated. Drink enough water and fluids to keep your urine clear or pale yellow.  It may be helpful to massage, stretch, and relax the affected muscle.  For tight or tense muscles, use a warm towel, heating pad, or hot shower water directed to the  affected area.  If you are sore or have pain after a cramp or spasm, applying ice to the affected area may relieve discomfort.  Put ice in a plastic bag.  Place a towel between your skin and the bag.  Leave the ice on for 15-20 minutes, 03-04 times a day.  Medicines used to treat a known cause of cramps or spasms may help reduce their frequency or severity. Only take over-the-counter or prescription medicines as directed by your caregiver. SEEK MEDICAL CARE IF:  Your cramps or spasms get more severe, more frequent, or do not improve over time.  MAKE SURE YOU:   Understand these instructions.  Will watch your condition.  Will get help right away if you are not doing well or get worse. Document Released: 03/03/2002 Document Revised: 01/06/2013 Document Reviewed: 08/28/2012 [facility] Patient Information 2015 [place], [place]. This information is not intended to replace advice given to you by your health care provider. Make sure you discuss any questions you have with your health care provider.

## 2015-04-18 NOTE — ED Notes (Signed)
Pt sts left sided rib pain when taking shower today; pt sts pain worse with inspiration and movement

## 2015-04-18 NOTE — ED Provider Notes (Signed)
CSN: [id number]     Arrival date & time 04/18/15  1044 History   First MD Initiated Contact with Patient 04/18/15 1113     Chief Complaint  Patient presents with  . rib pain      (Consider location/radiation/quality/duration/timing/severity/associated sxs/prior Treatment) HPI Katherine [name] is a 44 y.o. female with hx of asthma, migraines, fibromyalgia, presents to ED with complaint of left posterior rib pain. Patient states her symptoms began suddenly while in the shower. Patient states pain is sharp. States pain is worsened with movement, deep breathing, coughing or sneezing. She states she does feel short of breath. Denies dizziness or lightheadedness. Denies any injuries. Denies history of the same. No medications taken prior to coming in. Denies any prior heart lung problems. Patient is a smoker. No recent travel or surgeries. The pain in her extremities. No other complaints.  Past Medical History  Diagnosis Date  . Pneumonia     with cavitation of left lower lobe  . Asthma   . Migraine   . Hot flashes   . Palpitations   . Chest pressure   . Anxiety   . Fibromyalgia   . Ovarian cyst    Past Surgical History  Procedure Laterality Date  . Tubal ligation  1993   Family History  Problem Relation Age of Onset  . Migraines Daughter   . Migraines Son   . Hypertension    . Hypertension Mother   . Hypertension Father   . Colon cancer Neg Hx   . Colon polyps Neg Hx   . Diabetes Neg Hx   . Heart disease Mother   . Heart disease Father   . Kidney disease Neg Hx   . Liver disease Neg Hx   . Breast cancer Sister 40    deceased at age 70  . Heart attack Neg Hx   . Stroke Neg Hx    History  Substance Use Topics  . Smoking status: Current Some Day Smoker -- 0.05 packs/day    Types: Cigarettes  . Smokeless tobacco: Never Used     Comment: form given 01-20-13  . Alcohol Use: No   OB History    Gravida Para Term Preterm AB TAB SAB Ectopic Multiple Living   2 2 1 1      2       Review of Systems  Constitutional: Negative for fever and chills.  Respiratory: Positive for chest tightness and shortness of breath. Negative for cough.   Cardiovascular: Positive for chest pain. Negative for palpitations and leg swelling.  Gastrointestinal: Negative for nausea, vomiting, abdominal pain and diarrhea.  Genitourinary: Negative for dysuria, flank pain and pelvic pain.  Musculoskeletal: Positive for back pain. Negative for myalgias, arthralgias, neck pain and neck stiffness.  Skin: Negative for rash.  Neurological: Negative for dizziness, weakness and headaches.  All other systems reviewed and are negative.     Allergies  Morphine; Shellfish allergy; Penicillins; Codeine; Hydrocodone; and Tomato  Home Medications   Prior to Admission medications   Medication Sig Start Date End Date Taking? Authorizing Provider  albuterol (VENTOLIN HFA) 108 (90 BASE) MCG/ACT inhaler Inhale 2 puffs into the lungs every 6 (six) hours as needed. For shortness of breath 07/13/14  Yes Lorayne Marek, MD  famotidine (PEPCID) 20 MG tablet Take 1 tablet (20 mg total) by mouth at bedtime. Patient taking differently: Take 20 mg by mouth every morning.  12/03/14  Yes Lorayne Marek, MD  omeprazole (PRILOSEC) 20 MG capsule Take 1 capsule (  20 mg total) by mouth daily. Patient taking differently: Take 20 mg by mouth every evening.  12/03/14  Yes Deepak Advani, MD  fluticasone (FLONASE) 50 MCG/ACT nasal spray Place 2 sprays into both nostrils daily. 07/13/14   Lorayne Marek, MD  hydrocortisone cream 1 % Apply 1 application topically 2 (two) times daily. 12/03/14   Lorayne Marek, MD   BP 113/79 mmHg  Pulse 54  Temp(Src) 98.4 F (36.9 C)  Resp 18  Ht 5\' 5"  (1.651 m)  Wt 145 lb (65.772 kg)  BMI 24.13 kg/m2  SpO2 100%  LMP 04/18/2015 Physical Exam  Constitutional: She appears well-developed and well-nourished. No distress.  HENT:  Head: Normocephalic.  Eyes: Conjunctivae are normal.  Neck: Neck  supple.  Cardiovascular: Normal rate, regular rhythm and normal heart sounds.   Pulmonary/Chest: Effort normal and breath sounds normal. No respiratory distress. She has no wheezes. She has no rales. She exhibits tenderness.  Tenderness to palpation over left lower posterior lateral ribs. No swelling, bruising, erythema. No rash. Pain is worsened with left arm movement.  Abdominal: Soft. Bowel sounds are normal. She exhibits no distension. There is no tenderness. There is no rebound.  Left CVA tenderness.  Musculoskeletal: She exhibits no edema.  Neurological: She is alert.  Skin: Skin is warm and dry.  Psychiatric: She has a normal mood and affect. Her behavior is normal.  Nursing note and vitals reviewed.   ED Course  Procedures (including critical care time) Labs Review Labs Reviewed  CBC WITH DIFFERENTIAL/PLATELET - Abnormal; Notable for the following:    HCT 48.5 (*)    MCV 105.0 (*)    MCHC 26.6 (*)    RDW 15.6 (*)    Neutrophils Relative % 38 (*)    Lymphocytes Relative 52 (*)    All other components within normal limits  HEPATIC FUNCTION PANEL - Abnormal; Notable for the following:    ALT 13 (*)    All other components within normal limits  URINALYSIS, ROUTINE W REFLEX MICROSCOPIC (NOT AT [facility]) - Abnormal; Notable for the following:    Hgb urine dipstick SMALL (*)    All other components within normal limits  URINE MICROSCOPIC-ADD ON - Abnormal; Notable for the following:    Squamous Epithelial / LPF FEW (*)    All other components within normal limits  TROPONIN I  D-DIMER, QUANTITATIVE (NOT AT [facility])  LIPASE, BLOOD  BASIC METABOLIC PANEL  POC URINE PREG, ED    Imaging Review Dg Ribs Unilateral W/chest Left  04/18/2015   CLINICAL DATA:  Acute onset lower left rib pain this morning. No known injury. Initial encounter.  EXAM: LEFT RIBS AND CHEST - 3+ VIEW  COMPARISON:  PA and lateral chest 09/06/2012.  FINDINGS: The lungs are clear. Heart size is normal. There is no  pneumothorax or pleural effusion. No bony abnormality is identified.  IMPRESSION: Negative exam.   Electronically Signed   By: Inge Rise M.D.   On: 04/18/2015 12:15     EKG Interpretation None      MDM   Final diagnoses:  Rib pain on left side    pt with left sided chest wall pain, reproducible with movement and palpation, also with deep breathing. Pt is a smoker. No lower extremity swelling or pain. Will get a d-dimer. Will get labs, chest x-ray. Percocet ordered for pain.  1:58 PM Patient's labs unremarkable including negative d-dimer and troponin. Patient's pain is atypical, I do not think this is due to coronary  artery disease. Her vital signs are normal. Most likely musculoskeletal pain. Will treat with tramadol, Robaxin. Follow with primary care doctor.  Filed Vitals:   04/18/15 1047 04/18/15 1120 04/18/15 1229 04/18/15 1330  BP: 123/83 113/79 114/79 122/75  Pulse: 53 54 56 53  Temp: 98.4 F (36.9 C)     Resp: 17 18 18 11   Height: 5\' 5"  (1.651 m)     Weight: 145 lb (65.772 kg)     SpO2: 100% 100% 100% 100%      Jeannett Senior, PA-C 04/18/15 [place], MD 04/18/15 [phone]

## 2015-06-14 ENCOUNTER — Ambulatory Visit: Payer: Medicaid Other | Attending: Family Medicine | Admitting: Family Medicine

## 2015-06-14 ENCOUNTER — Encounter (INDEPENDENT_AMBULATORY_CARE_PROVIDER_SITE_OTHER): Payer: Self-pay

## 2015-06-14 ENCOUNTER — Encounter: Payer: Self-pay | Admitting: Family Medicine

## 2015-06-14 VITALS — BP 115/80 | HR 62 | Temp 98.7°F | Resp 16 | Ht 65.0 in | Wt 134.0 lb

## 2015-06-14 DIAGNOSIS — I471 Supraventricular tachycardia: Secondary | ICD-10-CM

## 2015-06-14 DIAGNOSIS — M545 Low back pain, unspecified: Secondary | ICD-10-CM

## 2015-06-14 DIAGNOSIS — R0981 Nasal congestion: Secondary | ICD-10-CM | POA: Insufficient documentation

## 2015-06-14 DIAGNOSIS — K2100 Gastro-esophageal reflux disease with esophagitis, without bleeding: Secondary | ICD-10-CM

## 2015-06-14 DIAGNOSIS — G44209 Tension-type headache, unspecified, not intractable: Secondary | ICD-10-CM | POA: Insufficient documentation

## 2015-06-14 MED ORDER — FAMOTIDINE 40 MG PO TABS: 20.0000 mg | ORAL_TABLET | Freq: Two times a day (BID) | ORAL | Status: DC

## 2015-06-14 MED ORDER — METHOCARBAMOL 500 MG PO TABS: 500.0000 mg | ORAL_TABLET | Freq: Two times a day (BID) | ORAL | Status: DC

## 2015-06-14 MED ORDER — FLUTICASONE PROPIONATE 50 MCG/ACT NA SUSP: 2.0000 | Freq: Every day | NASAL | Status: DC

## 2015-06-14 NOTE — Progress Notes (Signed)
Patient ID: Katherine [name], female   DOB: 08-21-1971, 44 y.o.   MRN: [id number]   Subjective:  Patient ID: Katherine [name], female    DOB: 1971-03-24  Age: 44 y.o. MRN: [id number]  CC: Establish Care migraine  HPI Katherine [name] presents for migraine.   1. Chronic headaches: for many years. Has been treated by neurology in the past. Reports botox injections with no improvement in pain symptoms. Has daily HA. With tension in L temple. Has chronic GERD. No nausea or vision changes.   Outpatient Prescriptions Prior to Visit  Medication Sig Dispense Refill  . albuterol (VENTOLIN HFA) 108 (90 BASE) MCG/ACT inhaler Inhale 2 puffs into the lungs every 6 (six) hours as needed. For shortness of breath (Patient not taking: Reported on 06/14/2015) 1 each 3  . famotidine (PEPCID) 20 MG tablet Take 1 tablet (20 mg total) by mouth at bedtime. (Patient not taking: Reported on 06/14/2015) 30 tablet 2  . fluticasone (FLONASE) 50 MCG/ACT nasal spray Place 2 sprays into both nostrils daily. (Patient not taking: Reported on 06/14/2015) 16 g 3  . hydrocortisone cream 1 % Apply 1 application topically 2 (two) times daily. (Patient not taking: Reported on 06/14/2015) 30 g 1  . methocarbamol (ROBAXIN) 500 MG tablet Take 1 tablet (500 mg total) by mouth 2 (two) times daily. (Patient not taking: Reported on 06/14/2015) 20 tablet 0  . omeprazole (PRILOSEC) 20 MG capsule Take 1 capsule (20 mg total) by mouth daily. (Patient not taking: Reported on 06/14/2015) 30 capsule 3  . traMADol (ULTRAM) 50 MG tablet Take 1 tablet (50 mg total) by mouth every 6 (six) hours as needed. (Patient not taking: Reported on 06/14/2015) 15 tablet 0   No facility-administered medications prior to visit.    ROS Review of Systems  Constitutional: Negative for fever and chills.  Eyes: Negative for visual disturbance.  Respiratory: Negative for shortness of breath.   Cardiovascular: Negative for chest pain.  Gastrointestinal: Negative for abdominal pain  and blood in stool.  Musculoskeletal: Positive for myalgias. Negative for back pain and arthralgias.  Skin: Negative for rash.  Allergic/Immunologic: Negative for immunocompromised state.  Neurological: Positive for headaches.  Hematological: Negative for adenopathy. Does not bruise/bleed easily.  Psychiatric/Behavioral: Negative for suicidal ideas and dysphoric mood.    Objective:  BP 115/80 mmHg  Pulse 62  Temp(Src) 98.7 F (37.1 C) (Oral)  Resp 16  Ht 5\' 5"  (1.651 m)  Wt 134 lb (60.782 kg)  BMI 22.30 kg/m2  SpO2 100%  LMP 06/07/2015  BP/Weight 06/14/2015 6/38/7564 11/25/2949  Systolic BP 884 166 063  Diastolic BP 80 75 58  Wt. (Lbs) 134 145 -  BMI 22.3 24.13 -    Physical Exam  Constitutional: She is oriented to person, place, and time. She appears well-developed and well-nourished. No distress.  HENT:  Head: Normocephalic and atraumatic.  Right Ear: Tympanic membrane, external ear and ear canal normal.  Left Ear: Tympanic membrane, external ear and ear canal normal.  Nose: Mucosal edema present.  Mouth/Throat: Oropharynx is clear and moist and mucous membranes are normal.  Cardiovascular: Normal rate, regular rhythm, normal heart sounds and intact distal pulses.   Pulmonary/Chest: Effort normal and breath sounds normal.  Musculoskeletal: She exhibits no edema.  Neurological: She is alert and oriented to person, place, and time.  Skin: Skin is warm and dry. No rash noted.  Psychiatric: She has a normal mood and affect.  GAD-7: score of 15. 1-4, 6, 7. 2-2,3,6. 3-1,5  Assessment & Plan:   Problem List Items Addressed This Visit    Atrial tachycardia, paroxysmal (Chronic)   Relevant Orders   Ferritin   Iron and TIBC   BACK PAIN, LUMBAR (Chronic)   Relevant Medications   methocarbamol (ROBAXIN) 500 MG tablet   GERD (gastroesophageal reflux disease) - Primary (Chronic)   Relevant Medications   famotidine (PEPCID) 40 MG tablet   Headache (Chronic)   Relevant  Medications   methocarbamol (ROBAXIN) 500 MG tablet   Other Relevant Orders   Vitamin D, 25-hydroxy   CT Head Wo Contrast   Ambulatory referral to Neurology    Other Visit Diagnoses    Nasal congestion        Relevant Medications    fluticasone (FLONASE) 50 MCG/ACT nasal spray       No orders of the defined types were placed in this encounter.    Follow-up: Return in about 2 months (around 08/14/2015).   [name] MD

## 2015-06-14 NOTE — Progress Notes (Signed)
Establish Care with PCP F/U migraine  Pressure on lt side of head

## 2015-06-14 NOTE — Patient Instructions (Addendum)
Katherine [name] was seen today for establish care.  Diagnoses and all orders for this visit:  Gastroesophageal reflux disease with esophagitis -     famotidine (PEPCID) 40 MG tablet; Take 0.5 tablets (20 mg total) by mouth 2 (two) times daily.  Nasal congestion -     fluticasone (FLONASE) 50 MCG/ACT nasal spray; Place 2 sprays into both nostrils daily.  Tension-type headache, not intractable, unspecified chronicity pattern -     Vitamin D, 25-hydroxy -     CT Head Wo Contrast; Future -     Ambulatory referral to Neurology  Atrial tachycardia, paroxysmal -     Ferritin -     Iron and TIBC  Bilateral low back pain without sciatica -     methocarbamol (ROBAXIN) 500 MG tablet; Take 1 tablet (500 mg total) by mouth 2 (two) times daily.  F/u in flu clinic for flu shot  F/u in 2 months for headache   Dr. Adrian Blackwater

## 2015-06-15 ENCOUNTER — Telehealth: Payer: Self-pay | Admitting: *Deleted

## 2015-06-15 LAB — IRON AND TIBC
%SAT: 20 % (ref 11–50)
Iron: 61 ug/dL (ref 40–190)
TIBC: 307 ug/dL (ref 250–450)
UIBC: 246 ug/dL (ref 125–400)

## 2015-06-15 LAB — VITAMIN D 25 HYDROXY (VIT D DEFICIENCY, FRACTURES): VIT D 25 HYDROXY: 27 ng/mL — AB (ref 30–100)

## 2015-06-15 LAB — FERRITIN: Ferritin: 44 ng/mL (ref 10–291)

## 2015-06-15 NOTE — Telephone Encounter (Signed)
LVM to return call  Please let pt know has CT appointmet on sep 26,2016 at 1pm arriving 15 min early

## 2015-06-21 ENCOUNTER — Ambulatory Visit (HOSPITAL_COMMUNITY): Payer: Medicaid Other

## 2015-06-25 ENCOUNTER — Encounter: Payer: Self-pay | Admitting: Neurology

## 2015-06-25 ENCOUNTER — Ambulatory Visit (INDEPENDENT_AMBULATORY_CARE_PROVIDER_SITE_OTHER): Payer: Medicaid Other | Admitting: Neurology

## 2015-06-25 VITALS — BP 109/74 | HR 69 | Ht 65.0 in | Wt 140.0 lb

## 2015-06-25 DIAGNOSIS — G43911 Migraine, unspecified, intractable, with status migrainosus: Secondary | ICD-10-CM

## 2015-06-25 DIAGNOSIS — M797 Fibromyalgia: Secondary | ICD-10-CM

## 2015-06-25 MED ORDER — GABAPENTIN 300 MG PO CAPS: ORAL_CAPSULE | ORAL | Status: DC

## 2015-06-25 NOTE — Patient Instructions (Addendum)
  We will start gabapentin for the headache and the fibromyalgia. Look out for drowsiness and ankle swelling.  Migraine Headache A migraine headache is an intense, throbbing pain on one or both sides of your head. A migraine can last for 30 minutes to several hours. CAUSES  The exact cause of a migraine headache is not always known. However, a migraine may be caused when nerves in the brain become irritated and release chemicals that cause inflammation. This causes pain. Certain things may also trigger migraines, such as:  Alcohol.  Smoking.  Stress.  Menstruation.  Aged cheeses.  Foods or drinks that contain nitrates, glutamate, aspartame, or tyramine.  Lack of sleep.  Chocolate.  Caffeine.  Hunger.  Physical exertion.  Fatigue.  Medicines used to treat chest pain (nitroglycerine), birth control pills, estrogen, and some blood pressure medicines. SIGNS AND SYMPTOMS  Pain on one or both sides of your head.  Pulsating or throbbing pain.  Severe pain that prevents daily activities.  Pain that is aggravated by any physical activity.  Nausea, vomiting, or both.  Dizziness.  Pain with exposure to bright lights, loud noises, or activity.  General sensitivity to bright lights, loud noises, or smells. Before you get a migraine, you may get warning signs that a migraine is coming (aura). An aura may include:  Seeing flashing lights.  Seeing bright spots, halos, or zigzag lines.  Having tunnel vision or blurred vision.  Having feelings of numbness or tingling.  Having trouble talking.  Having muscle weakness. DIAGNOSIS  A migraine headache is often diagnosed based on:  Symptoms.  Physical exam.  A CT scan or MRI of your head. These imaging tests cannot diagnose migraines, but they can help rule out other causes of headaches. TREATMENT Medicines may be given for pain and nausea. Medicines can also be given to help prevent recurrent migraines.  HOME CARE  INSTRUCTIONS  Only take over-the-counter or prescription medicines for pain or discomfort as directed by your health care provider. The use of long-term narcotics is not recommended.  Lie down in a dark, quiet room when you have a migraine.  Keep a journal to find out what may trigger your migraine headaches. For example, write down:  What you eat and drink.  How much sleep you get.  Any change to your diet or medicines.  Limit alcohol consumption.  Quit smoking if you smoke.  Get 7-9 hours of sleep, or as recommended by your health care provider.  Limit stress.  Keep lights dim if bright lights bother you and make your migraines worse. SEEK IMMEDIATE MEDICAL CARE IF:   Your migraine becomes severe.  You have a fever.  You have a stiff neck.  You have vision loss.  You have muscular weakness or loss of muscle control.  You start losing your balance or have trouble walking.  You feel faint or pass out.  You have severe symptoms that are different from your first symptoms. MAKE SURE YOU:   Understand these instructions.  Will watch your condition.  Will get help right away if you are not doing well or get worse. Document Released: 09/11/2005 Document Revised: 01/26/2014 Document Reviewed: 05/19/2013 [facility] Patient Information 2015 [place], [place]. This information is not intended to replace advice given to you by your health care provider. Make sure you discuss any questions you have with your health care provider.

## 2015-06-25 NOTE — Progress Notes (Signed)
Reason for visit: Headache  Referring physician: Dr. Hilbert [name] is a 44 y.o. female  History of present illness:  Katherine [name] is a 44 year old right-handed black female with a lifelong history of headache. She began having severe headaches when she was around 44 or 44 years old, but the headaches have been very severe, essentially daily for greater than 10 years. The patient has not had a day without headache in greater than 5 years. She indicates that the headaches are throughout the head with a pressure sensation, she may have some nausea and vomiting, visual blurring, dizziness, and some occasional neck stiffness. She has some intermittent tingling in the fingers of the hands, she denies any numbness elsewhere, she denies any weakness of the extremities. She indicates that bright lights, talking too long, and stress will bring on the headache. The patient indicates that she is sleeping fairly well. She has been followed through a headache center previously, she cannot remember much about any of the medication she was tried on before, she believes that she was on Topamax. She had one session of Botox injections, but she did not like the needles and did not continue the therapy. The patient denies any issues controlling the bowels or the bladder, she has some slight imbalance, no falls. She has had one episode of syncope 5 years ago. She is sent to this office for an evaluation. A CT scan of the head has been ordered, not yet done. The patient also reports a history of fibromyalgia. She indicates that she has never been placed on any medications for fibromyalgia previously.  Past Medical History  Diagnosis Date  . Pneumonia     with cavitation of left lower lobe  . Asthma   . Migraine   . Hot flashes   . Palpitations   . Chest pressure   . Anxiety   . Fibromyalgia   . Ovarian cyst     Past Surgical History  Procedure Laterality Date  . Tubal ligation  1993    Family History    Problem Relation Age of Onset  . Migraines Daughter   . Migraines Son   . Hypertension    . Hypertension Mother   . Heart disease Mother   . Hypertension Father   . Heart disease Father   . Colon cancer Neg Hx   . Colon polyps Neg Hx   . Diabetes Neg Hx   . Kidney disease Neg Hx   . Liver disease Neg Hx   . Heart attack Neg Hx   . Stroke Neg Hx   . Breast cancer Sister 47    deceased at age 4    Social history:  reports that she has quit smoking. Her smoking use included Cigarettes. She smoked 0.05 packs per day. She has never used smokeless tobacco. She reports that she does not drink alcohol or use illicit drugs.  Medications:  Prior to Admission medications   Medication Sig Start Date End Date Taking? Authorizing Provider  famotidine (PEPCID) 40 MG tablet Take 0.5 tablets (20 mg total) by mouth 2 (two) times daily. 06/14/15  Yes Josalyn Funches, MD  fluticasone (FLONASE) 50 MCG/ACT nasal spray Place 2 sprays into both nostrils daily. 06/14/15  Yes Josalyn Funches, MD  methocarbamol (ROBAXIN) 500 MG tablet Take 1 tablet (500 mg total) by mouth 2 (two) times daily. 06/14/15  Yes Josalyn Funches, MD  gabapentin (NEURONTIN) 300 MG capsule One capsule twice a day for 2 weeks,  then take one capsule three times a day 06/25/15   Kathrynn Ducking, MD  traMADol (ULTRAM) 50 MG tablet Take 50 mg by mouth every 6 (six) hours as needed. 04/18/15   Historical Provider, MD      Allergies  Allergen Reactions  . Morphine Anaphylaxis, Hives and Swelling  . Shellfish Allergy Anaphylaxis  . Penicillins Other (See Comments)    Yeast infection  . Codeine Hives  . Hydrocodone Hives  . Tomato Rash    ROS:  Out of a complete 14 system review of symptoms, the patient complains only of the following symptoms, and all other reviewed systems are negative.  Heart murmur Weight loss Ringing in the ears Moles Achy muscles Allergies, skin sensitivity Memory loss, headache, weakness,  dizziness Anxiety  Blood pressure 109/74, pulse 69, height 5\' 5"  (1.651 m), weight 140 lb (63.504 kg), last menstrual period 06/07/2015.  Physical Exam  General: The patient is alert and cooperative at the time of the examination.  Eyes: Pupils are equal, round, and reactive to light. Discs are flat bilaterally.  Neck: The neck is supple, no carotid bruits are noted.  Respiratory: The respiratory examination is clear.  Cardiovascular: The cardiovascular examination reveals a regular rate and rhythm, no obvious murmurs or rubs are noted.  Neuromuscular: Range of movement of the cervical spine is full, no crepitus is noted in the temporomandibular joints.  Skin: Extremities are without significant edema.  Neurologic Exam  Mental status: The patient is alert and oriented x 3 at the time of the examination. The patient has apparent normal recent and remote memory, with an apparently normal attention span and concentration ability.  Cranial nerves: Facial symmetry is present. There is good sensation of the face to pinprick and soft touch bilaterally. The strength of the facial muscles and the muscles to head turning and shoulder shrug are normal bilaterally. Speech is well enunciated, no aphasia or dysarthria is noted. Extraocular movements are full. Visual fields are full. The tongue is midline, and the patient has symmetric elevation of the soft palate. No obvious hearing deficits are noted.  Motor: The motor testing reveals 5 over 5 strength of all 4 extremities. Good symmetric motor tone is noted throughout.  Sensory: Sensory testing is intact to pinprick, soft touch, vibration sensation, and position sense on all 4 extremities. No evidence of extinction is noted.  Coordination: Cerebellar testing reveals good finger-nose-finger and heel-to-shin bilaterally.  Gait and station: Gait is normal. Tandem gait is normal. Romberg is negative. No drift is seen.  Reflexes: Deep tendon  reflexes are symmetric and normal bilaterally. Toes are downgoing bilaterally.   Assessment/Plan:  1. Intractable migraine headache  2. Fibromyalgia  The patient likely has a blend of muscle tension headache, migraine, and fibromyalgia. The patient will be placed on gabapentin for the headache and for the fibromyalgia. She indicates that Tylenol and Advil worsen her headache. We need to avoid potentially addictive medications in this patient. She denies that she takes in caffeinated products during the day. She will follow-up in 3-4 months.  Jill Alexanders MD 06/25/2015 4:57 PM  [facility] [name] [place], [place] [address]  Phone [phone] Fax [phone]

## 2015-07-09 ENCOUNTER — Telehealth: Payer: Self-pay | Admitting: *Deleted

## 2015-07-09 NOTE — Telephone Encounter (Signed)
-----   Message from [name], MD sent at 06/15/2015  9:21 AM EDT ----- Normal iron studies Light vit D def take 2000 IU D3 daily

## 2015-07-09 NOTE — Telephone Encounter (Signed)
Date of birth verified by pt Normal lab results  Low vit D, take 2000 unit daily  Pt verbalized understanding   CT schedule on 07/15/2015 at 230 arriving 15 min early at [facility] Pt aware of appointment

## 2015-07-15 ENCOUNTER — Ambulatory Visit (HOSPITAL_COMMUNITY): Admission: RE | Admit: 2015-07-15 | Payer: Medicaid Other | Source: Ambulatory Visit

## 2015-07-16 ENCOUNTER — Ambulatory Visit (HOSPITAL_COMMUNITY): Payer: Medicaid Other | Attending: Internal Medicine

## 2015-07-23 ENCOUNTER — Ambulatory Visit (HOSPITAL_COMMUNITY)
Admission: RE | Admit: 2015-07-23 | Discharge: 2015-07-23 | Disposition: A | Discharge status: Home / Self Care | Payer: Medicaid Other | Source: Ambulatory Visit | Attending: Internal Medicine | Admitting: Internal Medicine

## 2015-07-23 DIAGNOSIS — G8929 Other chronic pain: Secondary | ICD-10-CM

## 2015-07-23 DIAGNOSIS — G44209 Tension-type headache, unspecified, not intractable: Secondary | ICD-10-CM | POA: Diagnosis not present

## 2015-07-23 DIAGNOSIS — R51 Headache: Secondary | ICD-10-CM

## 2015-08-09 ENCOUNTER — Encounter: Payer: Self-pay | Admitting: Family Medicine

## 2015-08-09 ENCOUNTER — Ambulatory Visit: Payer: Medicaid Other | Attending: Family Medicine | Admitting: Family Medicine

## 2015-08-09 ENCOUNTER — Telehealth: Payer: Self-pay | Admitting: *Deleted

## 2015-08-09 VITALS — BP 125/79 | HR 58 | Temp 99.1°F | Resp 16 | Ht 65.0 in | Wt 142.0 lb

## 2015-08-09 DIAGNOSIS — K219 Gastro-esophageal reflux disease without esophagitis: Secondary | ICD-10-CM

## 2015-08-09 DIAGNOSIS — G43909 Migraine, unspecified, not intractable, without status migrainosus: Secondary | ICD-10-CM

## 2015-08-09 DIAGNOSIS — R351 Nocturia: Secondary | ICD-10-CM

## 2015-08-09 LAB — POCT URINALYSIS DIPSTICK
Bilirubin, UA: NEGATIVE
GLUCOSE UA: NEGATIVE
Ketones, UA: NEGATIVE
Leukocytes, UA: NEGATIVE
NITRITE UA: NEGATIVE
PH UA: 7
PROTEIN UA: NEGATIVE
RBC UA: NEGATIVE
Spec Grav, UA: 1.025
UROBILINOGEN UA: 0.2

## 2015-08-09 LAB — GLUCOSE, POCT (MANUAL RESULT ENTRY): POC GLUCOSE: 88 mg/dL (ref 70–99)

## 2015-08-09 LAB — POCT GLYCOSYLATED HEMOGLOBIN (HGB A1C): Hemoglobin A1C: 5

## 2015-08-09 MED ORDER — OMEPRAZOLE 20 MG PO CPDR: 20.0000 mg | DELAYED_RELEASE_CAPSULE | Freq: Two times a day (BID) | ORAL | Status: DC

## 2015-08-09 MED ORDER — GABAPENTIN 300 MG PO CAPS: 600.0000 mg | ORAL_CAPSULE | Freq: Three times a day (TID) | ORAL | Status: DC

## 2015-08-09 NOTE — Progress Notes (Signed)
Patient ID: Katherine [name], female   DOB: 24-Dec-1970, 44 y.o.   MRN: [id number]   Subjective:  Patient ID: Katherine [name], female    DOB: 10-29-1970  Age: 44 y.o. MRN: [id number]  CC: Abdominal Pain and Establish Care   HPI HARGUN FIECHTNER presents for    1. Abdominal pain: with nausea and diarrhea x 2 days.  Pain is epigastric. Taking pepcid without relief. Diarrhea is non-bloody. She denies fever and chills.   2. Migraines: she has been seen by neurology. She is compliant with gabapentin 300 mg TID. She reports that this is not helping with headaches.   Social History  Substance Use Topics  . Smoking status: Current Every Day Smoker -- 0.05 packs/day    Types: Cigarettes  . Smokeless tobacco: Never Used     Comment: form given 01-20-13  . Alcohol Use: No    Outpatient Prescriptions Prior to Visit  Medication Sig Dispense Refill  . famotidine (PEPCID) 40 MG tablet Take 0.5 tablets (20 mg total) by mouth 2 (two) times daily. 60 tablet 5  . fluticasone (FLONASE) 50 MCG/ACT nasal spray Place 2 sprays into both nostrils daily. 16 g 3  . gabapentin (NEURONTIN) 300 MG capsule One capsule twice a day for 2 weeks, then take one capsule three times a day 90 capsule 3  . methocarbamol (ROBAXIN) 500 MG tablet Take 1 tablet (500 mg total) by mouth 2 (two) times daily. 30 tablet 0  . traMADol (ULTRAM) 50 MG tablet Take 50 mg by mouth every 6 (six) hours as needed.  0   No facility-administered medications prior to visit.    ROS Review of Systems  Constitutional: Negative for fever and chills.  Eyes: Negative for visual disturbance.  Respiratory: Negative for shortness of breath.   Cardiovascular: Negative for chest pain.  Gastrointestinal: Positive for nausea and abdominal pain. Negative for vomiting, diarrhea and blood in stool.  Genitourinary: Positive for frequency. Negative for dysuria and urgency.  Musculoskeletal: Negative for back pain and arthralgias.  Skin: Negative for rash.    Allergic/Immunologic: Negative for immunocompromised state.  Neurological: Positive for headaches.  Hematological: Negative for adenopathy. Does not bruise/bleed easily.  Psychiatric/Behavioral: Negative for suicidal ideas and dysphoric mood.    Objective:  BP 125/79 mmHg  Pulse 58  Temp(Src) 99.1 F (37.3 C) (Oral)  Resp 16  Ht 5\' 5"  (1.651 m)  Wt 142 lb (64.411 kg)  BMI 23.63 kg/m2  SpO2 100%  LMP 07/26/2015  BP/Weight 08/09/2015 06/25/2015 [id number]  Systolic BP 0000000 0000000 [id number]  Diastolic BP 79 74 80  Wt. (Lbs) 142 140 134  BMI 23.63 23.3 22.3   Physical Exam  Constitutional: She is oriented to person, place, and time. She appears well-developed and well-nourished. No distress.  Well-appearing   HENT:  Head: Normocephalic and atraumatic.  Mouth/Throat: Oropharynx is clear and moist.  Cardiovascular: Normal rate, regular rhythm, normal heart sounds and intact distal pulses.   Pulmonary/Chest: Effort normal and breath sounds normal.  Abdominal: Soft. Bowel sounds are normal. She exhibits no distension and no mass. There is no tenderness. There is no rebound and no guarding.  Musculoskeletal: She exhibits no edema.  Neurological: She is alert and oriented to person, place, and time.  Skin: Skin is warm and dry. No rash noted.  Psychiatric: She has a normal mood and affect.   Lab Results  Component Value Date   HGBA1C 5.0 08/09/2015   CBG 88 UA: normal  Assessment & Plan:  Problem List Items Addressed This Visit    GERD (gastroesophageal reflux disease) (Chronic)   Relevant Medications   omeprazole (PRILOSEC) 20 MG capsule   Migraine (Chronic)   Relevant Medications   gabapentin (NEURONTIN) 300 MG capsule   Nocturia - Primary   Relevant Orders   Glucose (CBG) (Completed)   HgB A1c (Completed)   POCT urinalysis dipstick (Completed)      No orders of the defined types were placed in this encounter.    Follow-up: No Follow-up on file.   [name]  MD

## 2015-08-09 NOTE — Telephone Encounter (Signed)
Patient verified DOB Patient informed of her CT showing no findings. Soft tissue and bones were all normal and no Mass finding were made. Patient was transferred to the front desk to schedule an appointment for stomach pains. Patient had no further questions.

## 2015-08-09 NOTE — Patient Instructions (Addendum)
Katherine [name] was seen today for abdominal pain and establish care.  Diagnoses and all orders for this visit:  Nocturia -     Glucose (CBG) -     HgB A1c -     POCT urinalysis dipstick  Gastroesophageal reflux disease, esophagitis presence not specified -     omeprazole (PRILOSEC) 20 MG capsule; Take 1 capsule (20 mg total) by mouth 2 (two) times daily before a meal.  Migraine without status migrainosus, not intractable, unspecified migraine type -     gabapentin (NEURONTIN) 300 MG capsule; Take 2 capsules (600 mg total) by mouth 3 (three) times daily.    Increase gabapentin to 600 mg at night for 3 days, then 600 mg twice daily for 3 days, then 600 mg three times a day  Change from pepcid to prilosec for GERD  F/u in 3 months for GERD   Dr. Adrian Blackwater

## 2015-08-09 NOTE — Progress Notes (Signed)
C/C Abdominal pain nausea and diarrhea x2day  Pain scale # 9 Tobacco use. 3 cigarette per day  No suicidal thought in the past two weeks

## 2015-08-30 ENCOUNTER — Encounter: Payer: Self-pay | Admitting: Internal Medicine

## 2015-08-30 ENCOUNTER — Ambulatory Visit (INDEPENDENT_AMBULATORY_CARE_PROVIDER_SITE_OTHER): Payer: Medicaid Other | Admitting: Internal Medicine

## 2015-08-30 VITALS — BP 118/70 | HR 55 | Ht 65.0 in | Wt 141.8 lb

## 2015-08-30 DIAGNOSIS — R002 Palpitations: Secondary | ICD-10-CM

## 2015-08-30 DIAGNOSIS — R42 Dizziness and giddiness: Secondary | ICD-10-CM

## 2015-08-30 NOTE — Patient Instructions (Signed)
Your physician recommends that you continue on your current medications as directed. Please refer to the Current Medication list given to you today. Your physician has recommended that you wear an event monitor. Event monitors are medical devices that record the heart's electrical activity. Doctors most often us these monitors to diagnose arrhythmias. Arrhythmias are problems with the speed or rhythm of the heartbeat. The monitor is a small, portable device. You can wear one while you do your normal daily activities. This is usually used to diagnose what is causing palpitations/syncope (passing out).  Follow up with your physician will depend on test results.   

## 2015-08-30 NOTE — Progress Notes (Signed)
Cardiology Office Note   Date:  08/30/2015   ID:  Katherine [name], DOB [birth year], MRN [id number]  PCP:  Minerva Ends, MD  Cardiologist:   Dorris Carnes, MD   No chief complaint on file.     History of Present Illness: Katherine [name] is a 44 y.o. female with a history of SVT  She has been seen by Beckie Salts  Ablation discussed and pt declined Since I saw her in the spring she says that she feels her heart race often.  Some dizzienss  No syncope.  Says it is daily         Current Outpatient Prescriptions  Medication Sig Dispense Refill  . fluticasone (FLONASE) 50 MCG/ACT nasal spray Place 2 sprays into both nostrils daily. 16 g 3  . gabapentin (NEURONTIN) 300 MG capsule Take 2 capsules (600 mg total) by mouth 3 (three) times daily. 180 capsule 3  . omeprazole (PRILOSEC) 20 MG capsule Take 1 capsule (20 mg total) by mouth 2 (two) times daily before a meal. 60 capsule 3  . methocarbamol (ROBAXIN) 500 MG tablet Take 1 tablet (500 mg total) by mouth 2 (two) times daily. (Patient not taking: Reported on 08/30/2015) 30 tablet 0  . [DISCONTINUED] diltiazem (CARDIZEM SR) 120 MG 12 hr capsule Take 1 capsule (120 mg total) by mouth daily. 30 capsule 6   No current facility-administered medications for this visit.    Allergies:   Morphine; Shellfish allergy; Penicillins; Codeine; Hydrocodone; and Tomato   Past Medical History  Diagnosis Date  . Pneumonia     with cavitation of left lower lobe  . Asthma   . Migraine   . Hot flashes   . Palpitations   . Chest pressure   . Anxiety   . Fibromyalgia   . Ovarian cyst     Past Surgical History  Procedure Laterality Date  . Tubal ligation  1993     Social History:  The patient  reports that she has been smoking Cigarettes.  She has been smoking about 0.05 packs per day. She has never used smokeless tobacco. She reports that she does not drink alcohol or use illicit drugs.   Family History:  The patient's family history includes  Breast cancer (age of onset: 61) in her sister; Heart disease in her father and mother; Hypertension in her father, mother, and another family member; Migraines in her daughter and son. There is no history of Colon cancer, Colon polyps, Diabetes, Kidney disease, Liver disease, Heart attack, or Stroke.    ROS:  Please see the history of present illness. All other systems are reviewed and  Negative to the above problem except as noted.    PHYSICAL EXAM: VS:  BP 118/70 mmHg  Pulse 55  Ht 5\' 5"  (1.651 m)  Wt 64.32 kg (141 lb 12.8 oz)  BMI 23.60 kg/m2  SpO2 99%  LMP 08/03/2015  GEN: Well nourished, well developed, in no acute distress HEENT: normal Neck: no JVD, carotid bruits, or masses Cardiac: RRR; no murmurs, rubs, or gallops,no edema  Respiratory:  clear to auscultation bilaterally, normal work of breathing GI: soft, nontender, nondistended, + BS  No hepatomegaly  MS: no deformity Moving all extremities   Skin: warm and dry, no rash Neuro:  Strength and sensation are intact Psych: euthymic mood, full affect   EKG:  EKG is ordered today.   Lipid Panel    Component Value Date/Time   CHOL 145 09/05/2013 1459   TRIG  76 09/05/2013 1459   HDL 46 09/05/2013 1459   CHOLHDL 3.2 09/05/2013 1459   VLDL 15 09/05/2013 1459   LDLCALC 84 09/05/2013 1459      Wt Readings from Last 3 Encounters:  08/30/15 64.32 kg (141 lb 12.8 oz)  08/09/15 64.411 kg (142 lb)  06/25/15 63.504 kg (140 lb)      ASSESSMENT AND PLAN:  1.  SVT  Would set up for event monitor to document  She is not otjtostatic on exam today  But I wonder if some of spells at least represent orhtosatasis Would continue current meds   Instructed pt to increasse hydration.         Signed, Dorris Carnes, MD  08/30/2015 9:25 AM    [facility] [place], [place], [place]  [address] Phone: [phone]; Fax: [phone]

## 2015-09-01 ENCOUNTER — Ambulatory Visit (INDEPENDENT_AMBULATORY_CARE_PROVIDER_SITE_OTHER): Payer: Medicaid Other

## 2015-09-01 DIAGNOSIS — R002 Palpitations: Secondary | ICD-10-CM

## 2015-09-01 DIAGNOSIS — R42 Dizziness and giddiness: Secondary | ICD-10-CM | POA: Diagnosis not present

## 2015-10-14 ENCOUNTER — Other Ambulatory Visit: Payer: Self-pay | Admitting: *Deleted

## 2015-10-14 MED ORDER — LABETALOL HCL 100 MG PO TABS: 100.0000 mg | ORAL_TABLET | Freq: Two times a day (BID) | ORAL | Status: DC

## 2015-10-25 ENCOUNTER — Ambulatory Visit (INDEPENDENT_AMBULATORY_CARE_PROVIDER_SITE_OTHER): Payer: Medicaid Other | Admitting: Adult Health

## 2015-10-25 ENCOUNTER — Encounter (INDEPENDENT_AMBULATORY_CARE_PROVIDER_SITE_OTHER): Payer: Self-pay

## 2015-10-25 ENCOUNTER — Encounter: Payer: Self-pay | Admitting: Adult Health

## 2015-10-25 VITALS — BP 101/68 | HR 64 | Ht 65.0 in | Wt 143.5 lb

## 2015-10-25 DIAGNOSIS — R413 Other amnesia: Secondary | ICD-10-CM

## 2015-10-25 DIAGNOSIS — M797 Fibromyalgia: Secondary | ICD-10-CM

## 2015-10-25 DIAGNOSIS — G43909 Migraine, unspecified, not intractable, without status migrainosus: Secondary | ICD-10-CM

## 2015-10-25 MED ORDER — GABAPENTIN 300 MG PO CAPS: 900.0000 mg | ORAL_CAPSULE | Freq: Three times a day (TID) | ORAL | Status: DC

## 2015-10-25 NOTE — Progress Notes (Signed)
PATIENT: BLIMY TUDOR DOB: 25-Mar-1971  REASON FOR VISIT: follow up HISTORY FROM: patient  HISTORY OF PRESENT ILLNESS:  Mrs. Shines is a 45 year old female with a history of headaches and fibromyalgia. She returns today for follow-up. She is currently taking gabapentin 600 mg 3 times a day. She feels that this has helped some but has not resolved her pain. She continues to have daily headaches. Her headaches are holoacranial.  She confirms photophobia and phonophobia. She also has nausea but denies vomiting. Patient states that with her fibromyalgia she has pain all over. The patient also feels that she has memory loss with her headaches. She states that she can be in conversation and lose her train of thought. She also has trouble remembering conversations. At home she is able to complete all ADLs independently. Patient states that she typically has to write everything down. She drives but has gotten off several times. She returns today for an evaluation.  HISTORY 06/25/15: Ms. Pihl is a 45 year old right-handed black female with a lifelong history of headache. She began having severe headaches when she was around 32 or 45 years old, but the headaches have been very severe, essentially daily for greater than 10 years. The patient has not had a day without headache in greater than 5 years. She indicates that the headaches are throughout the head with a pressure sensation, she may have some nausea and vomiting, visual blurring, dizziness, and some occasional neck stiffness. She has some intermittent tingling in the fingers of the hands, she denies any numbness elsewhere, she denies any weakness of the extremities. She indicates that bright lights, talking too long, and stress will bring on the headache. The patient indicates that she is sleeping fairly well. She has been followed through a headache center previously, she cannot remember much about any of the medication she was tried on before, she believes  that she was on Topamax. She had one session of Botox injections, but she did not like the needles and did not continue the therapy. The patient denies any issues controlling the bowels or the bladder, she has some slight imbalance, no falls. She has had one episode of syncope 5 years ago. She is sent to this office for an evaluation. A CT scan of the head has been ordered, not yet done. The patient also reports a history of fibromyalgia. She indicates that she has never been placed on any medications for fibromyalgia previously.  REVIEW OF SYSTEMS: Out of a complete 14 system review of symptoms, the patient complains only of the following symptoms, and all other reviewed systems are negative.   Eye itching, light sensitivity, dizziness, headache, numbness, confusion, nervous/anxious, frequency of urination, food allergies  ALLERGIES: Allergies  Allergen Reactions  . Morphine Anaphylaxis, Hives and Swelling  . Shellfish Allergy Anaphylaxis  . Penicillins Other (See Comments)    Yeast infection  . Codeine Hives  . Hydrocodone Hives  . Tomato Rash    HOME MEDICATIONS: Outpatient Prescriptions Prior to Visit  Medication Sig Dispense Refill  . fluticasone (FLONASE) 50 MCG/ACT nasal spray Place 2 sprays into both nostrils daily. 16 g 3  . gabapentin (NEURONTIN) 300 MG capsule Take 2 capsules (600 mg total) by mouth 3 (three) times daily. 180 capsule 3  . labetalol (NORMODYNE) 100 MG tablet Take 1 tablet (100 mg total) by mouth 2 (two) times daily. 60 tablet 6  . omeprazole (PRILOSEC) 20 MG capsule Take 1 capsule (20 mg total) by mouth 2 (two)  times daily before a meal. 60 capsule 3  . methocarbamol (ROBAXIN) 500 MG tablet Take 1 tablet (500 mg total) by mouth 2 (two) times daily. (Patient not taking: Reported on 08/30/2015) 30 tablet 0   No facility-administered medications prior to visit.    PAST MEDICAL HISTORY: Past Medical History  Diagnosis Date  . Pneumonia     with cavitation of  left lower lobe  . Asthma   . Migraine   . Hot flashes   . Palpitations   . Chest pressure   . Anxiety   . Fibromyalgia   . Ovarian cyst     PAST SURGICAL HISTORY: Past Surgical History  Procedure Laterality Date  . Tubal ligation  1993    FAMILY HISTORY: Family History  Problem Relation Age of Onset  . Migraines Daughter   . Migraines Son   . Hypertension    . Hypertension Mother   . Heart disease Mother   . Hypertension Father   . Heart disease Father   . Colon cancer Neg Hx   . Colon polyps Neg Hx   . Diabetes Neg Hx   . Kidney disease Neg Hx   . Liver disease Neg Hx   . Heart attack Neg Hx   . Stroke Neg Hx   . Breast cancer Sister 10    deceased at age 79    SOCIAL HISTORY: Social History   Social History  . Marital Status: Married    Spouse Name: N/A  . Number of Children: 2  . Years of Education: 15   Occupational History  . unemployed    Social History Main Topics  . Smoking status: Current Every Day Smoker -- 0.05 packs/day    Types: Cigarettes  . Smokeless tobacco: Never Used     Comment: form given 01-20-13  . Alcohol Use: No  . Drug Use: No     Comment: pt states she has d/c marijuana  . Sexual Activity: Yes    Birth Control/ Protection: Surgical   Other Topics Concern  . Not on file   Social History Narrative   On disability for heart condition.  Pt. Was non-specific.       Patient does not drink caffeine.   Patient is ambidextrous, but mostly right handed.       PHYSICAL EXAM  Filed Vitals:   10/25/15 1028  BP: 101/68  Pulse: 64  Height: 5\' 5"  (1.651 m)  Weight: 143 lb 8 oz (65.091 kg)   Body mass index is 23.88 kg/(m^2).  MMSE - Mini Mental State Exam 10/25/2015  Orientation to time 5  Orientation to Place 4  Registration 3  Attention/ Calculation 2  Recall 2  Language- name 2 objects 2  Language- repeat 1  Language- follow 3 step command 3  Language- read & follow direction 1  Write a sentence 1  Copy design 1   Total score 25     Generalized: Well developed, in no acute distress   Neurological examination  Mentation: Alert oriented to time, place, history taking. Follows all commands speech and language fluent Cranial nerve II-XII: Pupils were equal round reactive to light. Extraocular movements were full, visual field were full on confrontational test. Facial sensation and strength were normal. Uvula tongue midline. Head turning and shoulder shrug  were normal and symmetric. Motor: The motor testing reveals 5 over 5 strength of all 4 extremities. Good symmetric motor tone is noted throughout.  Sensory: Sensory testing is intact to soft touch  on all 4 extremities. No evidence of extinction is noted.  Coordination: Cerebellar testing reveals good finger-nose-finger and heel-to-shin bilaterally.  Gait and station: Gait is normal. Tandem gait is normal. Romberg is negative. No drift is seen.  Reflexes: Deep tendon reflexes are symmetric and normal bilaterally.   DIAGNOSTIC DATA (LABS, IMAGING, TESTING) - I reviewed patient records, labs, notes, testing and imaging myself where available.  Lab Results  Component Value Date   WBC 5.9 04/18/2015   HGB 12.9 04/18/2015   HCT 48.5* 04/18/2015   MCV 105.0* 04/18/2015   PLT 180 04/18/2015      Component Value Date/Time   NA 138 04/18/2015 1422   K 4.3 04/18/2015 1422   CL 108 04/18/2015 1422   CO2 26 04/18/2015 1422   GLUCOSE 87 04/18/2015 1422   BUN 7 04/18/2015 1422   CREATININE 0.58 04/18/2015 1422   CREATININE 0.60 09/05/2013 1459   CALCIUM 9.1 04/18/2015 1422   PROT 6.8 04/18/2015 1228   ALBUMIN 3.8 04/18/2015 1228   AST 18 04/18/2015 1228   ALT 13* 04/18/2015 1228   ALKPHOS 61 04/18/2015 1228   BILITOT 0.5 04/18/2015 1228   GFRNONAA >60 04/18/2015 1422   GFRNONAA >89 09/05/2013 1459   GFRAA >60 04/18/2015 1422   GFRAA >89 09/05/2013 1459    ASSESSMENT AND PLAN 45 y.o. year old female  has a past medical history of Pneumonia;  Asthma; Migraine; Hot flashes; Palpitations; Chest pressure; Anxiety; Fibromyalgia; and Ovarian cyst. here with:   1. Headache  2. Fibromyalgia   3. Memory loss   I will increase the patient's gabapentin to 900 mg 3 times a day. If this is not beneficial for her headaches or fibromyalgia we can consider medication such as Cymbalta. Patient verbalized understanding. We will continue to monitor her memory. She will follow-up in 6 months or sooner if needed.       Ward Givens, MSN, NP-C 10/25/2015, 10:43 AM [facility] [address], [place], [place] [address] [phone]

## 2015-10-25 NOTE — Patient Instructions (Addendum)
Increase gabapentin to 900 mg three times a day  we will continue to monitor memory. If your symptoms worsen or you develop new symptoms please let us know.

## 2015-10-25 NOTE — Progress Notes (Signed)
I have read the note, and I agree with the clinical assessment and plan.  WILLIS,CHARLES KEITH   

## 2015-11-28 NOTE — Progress Notes (Signed)
Cardiology Office Note   Date:  11/30/2015   ID:  Katherine [name], DOB 31-May-1971, MRN [id number]  PCP:  Minerva Ends, MD  Cardiologist:   Dorris Carnes, MD   F?U of palpitations.      History of Present Illness: Katherine [name] is a 45 y.o. female with a history of SVt  She has been seen by Beckie Salts in past  Ablation discussed  I saw the pt back in December  She complained of heart racing  I recomm an event monitor   She wore it  No signif arrhythmia detected. She is on labetalol  Notes no difference. Still get palpitations.  Esp with activity  Some chest tightness      Outpatient Prescriptions Prior to Visit  Medication Sig Dispense Refill  . fluticasone (FLONASE) 50 MCG/ACT nasal spray Place 2 sprays into both nostrils daily. 16 g 3  . gabapentin (NEURONTIN) 300 MG capsule Take 3 capsules (900 mg total) by mouth 3 (three) times daily. 270 capsule 3  . labetalol (NORMODYNE) 100 MG tablet Take 1 tablet (100 mg total) by mouth 2 (two) times daily. 60 tablet 6  . omeprazole (PRILOSEC) 20 MG capsule Take 1 capsule (20 mg total) by mouth 2 (two) times daily before a meal. 60 capsule 3  . methocarbamol (ROBAXIN) 500 MG tablet Take 1 tablet (500 mg total) by mouth 2 (two) times daily. (Patient not taking: Reported on 11/29/2015) 30 tablet 0   No facility-administered medications prior to visit.     Allergies:   Morphine; Shellfish allergy; Penicillins; Codeine; Hydrocodone; and Tomato   Past Medical History  Diagnosis Date  . Pneumonia     with cavitation of left lower lobe  . Asthma   . Migraine   . Hot flashes   . Palpitations   . Chest pressure   . Anxiety   . Fibromyalgia   . Ovarian cyst     Past Surgical History  Procedure Laterality Date  . Tubal ligation  1993     Social History:  The patient  reports that she has been smoking Cigarettes.  She has been smoking about 0.05 packs per day. She has never used smokeless tobacco. She reports that she does not drink  alcohol or use illicit drugs.   Family History:  The patient's family history includes Breast cancer (age of onset: 26) in her sister; Heart disease in her father and mother; Hypertension in her father and mother; Migraines in her daughter and son. There is no history of Colon cancer, Colon polyps, Diabetes, Kidney disease, Liver disease, Heart attack, or Stroke.    ROS:  Please see the history of present illness. All other systems are reviewed and  Negative to the above problem except as noted.    PHYSICAL EXAM: VS:  BP 102/60 mmHg  Pulse 60  Ht 5\' 5"  (1.651 m)  Wt 147 lb (66.679 kg)  BMI 24.46 kg/m2  SpO2 99%  GEN: Well nourished, well developed, in no acute distress HEENT: normal Neck: no JVD, carotid bruits, or masses Cardiac: RRR; no murmurs, rubs, or gallops,no edema  Respiratory:  clear to auscultation bilaterally, normal work of breathing GI: soft, nontender, nondistended, + BS  No hepatomegaly  MS: no deformity Moving all extremities   Skin: warm and dry, no rash Neuro:  Strength and sensation are intact Psych: euthymic mood, full affect   EKG:  EKG is not ordered today.   Lipid Panel    Component  Value Date/Time   CHOL 145 09/05/2013 1459   TRIG 76 09/05/2013 1459   HDL 46 09/05/2013 1459   CHOLHDL 3.2 09/05/2013 1459   VLDL 15 09/05/2013 1459   LDLCALC 84 09/05/2013 1459      Wt Readings from Last 3 Encounters:  11/29/15 147 lb (66.679 kg)  10/25/15 143 lb 8 oz (65.091 kg)  08/30/15 141 lb 12.8 oz (64.32 kg)      ASSESSMENT AND PLAN:  1.  Palpitations  Pt continues to feel  Labetalol has not helped  No documented arrhythmias.    I would recomm a stress echo given some chest tightness with activity Stop labetalol over several days      Orders Placed This Encounter  Procedures  . Echo stress    Signed, Dorris Carnes, MD  11/30/2015 8:07 AM    [facility] [place], [place], [place]  [address] Phone: [phone]; Fax:  [phone]

## 2015-11-29 ENCOUNTER — Encounter: Payer: Self-pay | Admitting: Internal Medicine

## 2015-11-29 ENCOUNTER — Ambulatory Visit (INDEPENDENT_AMBULATORY_CARE_PROVIDER_SITE_OTHER): Payer: Medicaid Other | Admitting: Internal Medicine

## 2015-11-29 VITALS — BP 102/60 | HR 60 | Ht 65.0 in | Wt 147.0 lb

## 2015-11-29 DIAGNOSIS — R079 Chest pain, unspecified: Secondary | ICD-10-CM

## 2015-11-29 DIAGNOSIS — R002 Palpitations: Secondary | ICD-10-CM

## 2015-11-29 NOTE — Patient Instructions (Signed)
Your physician recommends that you continue on your current medications as directed. Please refer to the Current Medication list given to you today. Your physician has requested that you have a stress echocardiogram. For further information please visit [email or web address]. Please follow instruction sheet as given.  Your physician wants you to follow-up in: December, 2017 with Dr. Harrington Challenger.  You will receive a reminder letter in the mail two months in advance. If you don't receive a letter, please call our office to schedule the follow-up appointment.

## 2015-12-15 ENCOUNTER — Telehealth (HOSPITAL_COMMUNITY): Payer: Self-pay | Admitting: *Deleted

## 2015-12-15 NOTE — Telephone Encounter (Signed)
Left message on voicemail x2  in reference to upcoming appointment scheduled for 12/16/2015. Phone number given for a call back so details instructions can be given. Katherine [name]

## 2015-12-16 ENCOUNTER — Ambulatory Visit (HOSPITAL_COMMUNITY): Payer: Medicaid Other | Attending: Cardiology

## 2015-12-16 DIAGNOSIS — Z8249 Family history of ischemic heart disease and other diseases of the circulatory system: Secondary | ICD-10-CM | POA: Insufficient documentation

## 2015-12-16 DIAGNOSIS — Z720 Tobacco use: Secondary | ICD-10-CM | POA: Insufficient documentation

## 2015-12-16 DIAGNOSIS — R079 Chest pain, unspecified: Secondary | ICD-10-CM | POA: Insufficient documentation

## 2015-12-16 DIAGNOSIS — R002 Palpitations: Secondary | ICD-10-CM | POA: Diagnosis not present

## 2015-12-23 ENCOUNTER — Telehealth: Payer: Self-pay | Admitting: Internal Medicine

## 2015-12-23 NOTE — Telephone Encounter (Signed)
Notes Recorded by Rodman Key, RN on 12/23/2015 at 2:13 PM Left detailed message on home voice mail (as per [facility] note) of results per Dr. Harrington Challenger.  Advised to call back with any questions/concerns.

## 2015-12-23 NOTE — Telephone Encounter (Signed)
Follow Up: ° ° ° ° °Returning call from yesterday. °

## 2016-01-26 ENCOUNTER — Encounter: Payer: Self-pay | Admitting: *Deleted

## 2016-02-24 ENCOUNTER — Ambulatory Visit (INDEPENDENT_AMBULATORY_CARE_PROVIDER_SITE_OTHER): Payer: Medicaid Other | Admitting: Obstetrics & Gynecology

## 2016-02-24 ENCOUNTER — Encounter: Payer: Self-pay | Admitting: Obstetrics & Gynecology

## 2016-02-24 VITALS — BP 112/71 | HR 55 | Ht 65.0 in | Wt 147.5 lb

## 2016-02-24 DIAGNOSIS — R109 Unspecified abdominal pain: Secondary | ICD-10-CM

## 2016-02-24 DIAGNOSIS — N83201 Unspecified ovarian cyst, right side: Secondary | ICD-10-CM | POA: Diagnosis not present

## 2016-02-24 DIAGNOSIS — D259 Leiomyoma of uterus, unspecified: Secondary | ICD-10-CM | POA: Diagnosis present

## 2016-02-24 NOTE — Progress Notes (Signed)
Patient ID: Katherine [name], female   DOB: 1971-05-18, 45 y.o.   MRN: [id number] History:  45 y.o. [id number] here today for eval of right ov cyst. LMP 3 days prev.  Lighter than usual and more painful than usual .  Pt reports that pain has been present for 2-3 weeks. Pt had a sono done in April.  Pain is on LEFT side. Opposite the area where the cyst was identified.  Pt c/o nausea for 2 week. She is worried about being pregnant.     The following portions of the patient's history were reviewed and updated as appropriate: allergies, current medications, past family history, past medical history, past social history, past surgical history and problem list.  Review of Systems:  Pertinent items are noted in HPI.  Objective:  Physical Exam Blood pressure 112/71, pulse 55, height 5\' 5"  (1.651 m), weight 147 lb 8 oz (66.906 kg), last menstrual period 02/19/2016. Gen: NAD Abd: Soft, nontender and nondistended.  There is some tenderness over the abd muscles on th eleft side when the pt distends her abd wall. Pelvic: Normal appearing external genitalia; normal appearing vaginal mucosa and cervix.  Normal discharge.  Small uterus, no other palpable masses, no uterine or adnexal tenderness  Labs and Imaging 4.8cm right ov cyst 10.5x9.9x8.1cm with fibroids      Assessment & Plan:  Pain on left side- suspect muscular skeletal pain.    Rice soak or heating pad tid OTC NSAID prn F/u in 3 months for annual GYN exam or sooner prn    Lauraine Crespo L. Harraway-Smith, M.D., Cherlynn June

## 2016-04-24 ENCOUNTER — Ambulatory Visit: Payer: Medicaid Other | Admitting: Adult Health

## 2016-04-25 ENCOUNTER — Encounter: Payer: Self-pay | Admitting: Adult Health

## 2016-04-25 ENCOUNTER — Ambulatory Visit (INDEPENDENT_AMBULATORY_CARE_PROVIDER_SITE_OTHER): Payer: Medicaid Other | Admitting: Adult Health

## 2016-04-25 VITALS — BP 112/64 | HR 60 | Ht 65.0 in | Wt 151.2 lb

## 2016-04-25 DIAGNOSIS — M797 Fibromyalgia: Secondary | ICD-10-CM | POA: Diagnosis not present

## 2016-04-25 DIAGNOSIS — G43009 Migraine without aura, not intractable, without status migrainosus: Secondary | ICD-10-CM

## 2016-04-25 DIAGNOSIS — R413 Other amnesia: Secondary | ICD-10-CM

## 2016-04-25 NOTE — Patient Instructions (Addendum)
Continue gabapentin 900 mg three times a day  Referral for neuropsychological eval for memory loss If your symptoms worsen or you develop new symptoms please let us know.

## 2016-04-25 NOTE — Progress Notes (Signed)
PATIENT: Katherine [name] DOB: 04/24/1971  REASON FOR VISIT: follow up- headache, fibromyalgia, memory disturbance HISTORY FROM: patient  HISTORY OF PRESENT ILLNESS: Katherine [name] is a 45 year old female with a history of headache, fibromyalgia and memory disturbance. She returns today for follow-up. At the last visit gabapentin was increased to 900 mg 3 times a day. She reports that this has been beneficial. She states that it does help with her fibromyalgia discomfort as well as her headaches. She states that she continues to notice some changes with her memory. She feels that it started when she began taking "so much medication." She however does not feel it has any relation to gabapentin. The patient is only on labetalol and Prilosec. She lives at home with her significant other. She is able to complete all ADLs independently. She operates a motor vehicle but does report getting lost occasionally. She denies recreational drug use. Denies alcohol use. She returns today for an evaluation.  HISTORY 10/25/15: Katherine [name] is a 45 year old female with a history of headaches and fibromyalgia. She returns today for follow-up. She is currently taking gabapentin 600 mg 3 times a day. She feels that this has helped some but has not resolved her pain. She continues to have daily headaches. Her headaches are holoacranial.  She confirms photophobia and phonophobia. She also has nausea but denies vomiting. Patient states that with her fibromyalgia she has pain all over. The patient also feels that she has memory loss with her headaches. She states that she can be in conversation and lose her train of thought. She also has trouble remembering conversations. At home she is able to complete all ADLs independently. Patient states that she typically has to write everything down. She drives but has gotten off s----everal times. She returns today for an evaluation.  HISTORY 06/25/15: Ms. [name] is a 45 year old right-handed black  female with a lifelong history of headache. She began having severe headaches when she was around 42 or 46 years old, but the headaches have been very severe, essentially daily for greater than 10 years. The patient has not had a day without headache in greater than 5 years. She indicates that the headaches are throughout the head with a pressure sensation, she may have some nausea and vomiting, visual blurring, dizziness, and some occasional neck stiffness. She has some intermittent tingling in the fingers of the hands, she denies any numbness elsewhere, she denies any weakness of the extremities. She indicates that bright lights, talking too long, and stress will bring on the headache. The patient indicates that she is sleeping fairly well. She has been followed through a headache center previously, she cannot remember much about any of the medication she was tried on before, she believes that she was on Topamax. She had one session of Botox injections, but she did not like the needles and did not continue the therapy. The patient denies any issues controlling the bowels or the bladder, she has some slight imbalance, no falls. She has had one episode of syncope 5 years ago. She is sent to this office for an evaluation. A CT scan of the head has been ordered, not yet done. The patient also reports a history of fibromyalgia. She indicates that she has never been placed on any medications for fibromyalgia previously.   REVIEW OF SYSTEMS: Out of a complete 14 system review of symptoms, the patient complains only of the following symptoms, and all other reviewed systems are negative.  Dizziness, headache, joint  pain, back pain, nervous/anxious, ear pain, ringing in ears, eye itching, light sensitivity  ALLERGIES: Allergies  Allergen Reactions  . Morphine Anaphylaxis, Hives and Swelling  . Shellfish Allergy Anaphylaxis  . Penicillins Other (See Comments)    Yeast infection  . Codeine Hives  . Hydrocodone  Hives  . Tomato Rash    HOME MEDICATIONS: Outpatient Medications Prior to Visit  Medication Sig Dispense Refill  . fluticasone (FLONASE) 50 MCG/ACT nasal spray Place 2 sprays into both nostrils daily. 16 g 3  . gabapentin (NEURONTIN) 300 MG capsule Take 3 capsules (900 mg total) by mouth 3 (three) times daily. 270 capsule 3  . labetalol (NORMODYNE) 100 MG tablet Take 1 tablet (100 mg total) by mouth 2 (two) times daily. 60 tablet 6  . omeprazole (PRILOSEC) 20 MG capsule Take 1 capsule (20 mg total) by mouth 2 (two) times daily before a meal. 60 capsule 3   No facility-administered medications prior to visit.     PAST MEDICAL HISTORY: Past Medical History:  Diagnosis Date  . Anxiety   . Asthma   . Chest pressure   . Fibromyalgia   . Hot flashes   . Migraine   . Ovarian cyst   . Palpitations   . Pneumonia    with cavitation of left lower lobe    PAST SURGICAL HISTORY: Past Surgical History:  Procedure Laterality Date  . TUBAL LIGATION  1993    FAMILY HISTORY: Family History  Problem Relation Age of Onset  . Migraines Daughter   . Migraines Son   . Hypertension    . Hypertension Mother   . Heart disease Mother   . Hypertension Father   . Heart disease Father   . Colon cancer Neg Hx   . Colon polyps Neg Hx   . Diabetes Neg Hx   . Kidney disease Neg Hx   . Liver disease Neg Hx   . Heart attack Neg Hx   . Stroke Neg Hx   . Breast cancer Sister 16    deceased at age 33    SOCIAL HISTORY: Social History   Social History  . Marital status: Married    Spouse name: N/A  . Number of children: 2  . Years of education: 15   Occupational History  . unemployed    Social History Main Topics  . Smoking status: Current Some Day Smoker    Packs/day: 0.05    Types: Cigarettes  . Smokeless tobacco: Never Used     Comment: form given 01-20-13  . Alcohol use No  . Drug use: No     Comment: pt states she has d/c marijuana  . Sexual activity: Yes    Birth control/  protection: Surgical   Other Topics Concern  . Not on file   Social History Narrative   On disability for heart condition.  Pt. Was non-specific.       Patient does not drink caffeine.   Patient is ambidextrous, but mostly right handed.       PHYSICAL EXAM  Vitals:   04/25/16 1105  BP: 112/64  Pulse: 60  Weight: 151 lb 3.2 oz (68.6 kg)  Height: 5\' 5"  (1.651 m)   Body mass index is 25.16 kg/m.   MMSE - Mini Mental State Exam 04/25/2016 10/25/2015  Orientation to time 4 5  Orientation to Place 5 4  Registration 3 3  Attention/ Calculation 2 2  Recall 1 2  Language- name 2 objects 2 2  Language-  repeat 1 1  Language- follow 3 step command 3 3  Language- read & follow direction 1 1  Write a sentence 1 1  Copy design 0 1  Total score 23 25     Generalized: Well developed, in no acute distress   Neurological examination  Mentation: Alert oriented to time, place, history taking. Follows all commands speech and language fluent Cranial nerve II-XII: Pupils were equal round reactive to light. Extraocular movements were full, visual field were full on confrontational test. Facial sensation and strength were normal. Uvula tongue midline. Head turning and shoulder shrug  were normal and symmetric. Motor: The motor testing reveals 5 over 5 strength of all 4 extremities. Good symmetric motor tone is noted throughout.  Sensory: Sensory testing is intact to soft touch on all 4 extremities. No evidence of extinction is noted.  Coordination: Cerebellar testing reveals good finger-nose-finger and heel-to-shin bilaterally.  Gait and station: Gait is normal. Tandem gait is normal. Romberg is negative. No drift is seen.  Reflexes: Deep tendon reflexes are symmetric and normal bilaterally.   DIAGNOSTIC DATA (LABS, IMAGING, TESTING) - I reviewed patient records, labs, notes, testing and imaging myself where available.  Lab Results  Component Value Date   WBC 5.9 04/18/2015   HGB 12.9  04/18/2015   HCT 48.5 (H) 04/18/2015   MCV 105.0 (H) 04/18/2015   PLT 180 04/18/2015      Component Value Date/Time   NA 138 04/18/2015 1422   K 4.3 04/18/2015 1422   CL 108 04/18/2015 1422   CO2 26 04/18/2015 1422   GLUCOSE 87 04/18/2015 1422   BUN 7 04/18/2015 1422   CREATININE 0.58 04/18/2015 1422   CREATININE 0.60 09/05/2013 1459   CALCIUM 9.1 04/18/2015 1422   PROT 6.8 04/18/2015 1228   ALBUMIN 3.8 04/18/2015 1228   AST 18 04/18/2015 1228   ALT 13 (L) 04/18/2015 1228   ALKPHOS 61 04/18/2015 1228   BILITOT 0.5 04/18/2015 1228   GFRNONAA >60 04/18/2015 1422   GFRNONAA >89 09/05/2013 1459   GFRAA >60 04/18/2015 1422   GFRAA >89 09/05/2013 1459    Lab Results  Component Value Date   HGBA1C 5.0 08/09/2015      ASSESSMENT AND PLAN 45 y.o. year old female  has a past medical history of Anxiety; Asthma; Chest pressure; Fibromyalgia; Hot flashes; Migraine; Ovarian cyst; Palpitations; and Pneumonia. here with:  1. Fibromyalgia 2. Migraine headaches 3. Memory disturbance  The patient will continue on gabapentin 900 mg 3 times a day for fibromyalgia and headaches. I will refer the patient for neuropsychological testing. Her memory score has slightly declined however she feels that it may be medication related. She is confident that is not gabapentin however this could have an effect as well. In the future gabapentin may need to be decreased. She will follow-up in 3-4 months or sooner if needed.     Ward Givens, MSN, NP-C 04/25/2016, 11:23 AM [facility] [address], [place], [place] [address] [phone]

## 2016-04-25 NOTE — Progress Notes (Signed)
I have read the note, and I agree with the clinical assessment and plan.  WILLIS,CHARLES KEITH   

## 2016-05-27 ENCOUNTER — Other Ambulatory Visit: Payer: Self-pay | Admitting: Adult Health

## 2016-05-27 DIAGNOSIS — G43909 Migraine, unspecified, not intractable, without status migrainosus: Secondary | ICD-10-CM

## 2016-06-23 ENCOUNTER — Encounter: Payer: Self-pay | Admitting: Obstetrics & Gynecology

## 2016-07-26 ENCOUNTER — Encounter: Payer: Self-pay | Admitting: Adult Health

## 2016-07-26 ENCOUNTER — Ambulatory Visit (INDEPENDENT_AMBULATORY_CARE_PROVIDER_SITE_OTHER): Payer: Medicaid Other | Admitting: Adult Health

## 2016-07-26 VITALS — BP 110/73 | HR 67 | Wt 150.8 lb

## 2016-07-26 DIAGNOSIS — M797 Fibromyalgia: Secondary | ICD-10-CM

## 2016-07-26 DIAGNOSIS — R51 Headache: Secondary | ICD-10-CM | POA: Diagnosis not present

## 2016-07-26 DIAGNOSIS — R413 Other amnesia: Secondary | ICD-10-CM

## 2016-07-26 DIAGNOSIS — R519 Headache, unspecified: Secondary | ICD-10-CM

## 2016-07-26 NOTE — Patient Instructions (Signed)
Memory score has improved  Will send for neuropsychological testing If your symptoms worsen or you develop new symptoms please let us know.

## 2016-07-26 NOTE — Progress Notes (Signed)
I have read the note, and I agree with the clinical assessment and plan.  WILLIS,CHARLES KEITH   

## 2016-07-26 NOTE — Progress Notes (Signed)
PATIENT: Katherine [name] DOB: 1971-07-22  REASON FOR VISIT: follow up- headache, fibromyalgia, memory disturbance HISTORY FROM: patient  HISTORY OF PRESENT ILLNESS: Katherine [name] is a 45 year old female with a history of headache, fibromyalgia and memory disturbance. She returns today for follow-up. The patient is currently taking gabapentin 900 mg 3 times a day. She is tolerating this well. She states that it continues to be beneficial for her fibromyalgia and her headaches. She reports that her memory has continued to decline. She feels that she is very forgetful. She states that she forgets things as soon as her daughter or son tell her. Her husband does not seem to notice changes in her memory per the patient. She doesn't operate a motor vehicle but tends to use the GPS a lot. She no longer prepares meals as often as she used to. She does report some difficulty sleeping. At the last visit she was sent for neuropsychological testing however she has not had this done yet.  HISTORY 04/25/16: Katherine [name] is a 45 year old female with a history of headache, fibromyalgia and memory disturbance. She returns today for follow-up. At the last visit gabapentin was increased to 900 mg 3 times a day. She reports that this has been beneficial. She states that it does help with her fibromyalgia discomfort as well as her headaches. She states that she continues to notice some changes with her memory. She feels that it started when she began taking "so much medication." She however does not feel it has any relation to gabapentin. The patient is only on labetalol and Prilosec. She lives at home with her significant other. She is able to complete all ADLs independently. She operates a motor vehicle but does report getting lost occasionally. She denies recreational drug use. Denies alcohol use. She returns today for an evaluation.  HISTORY 10/25/15: Katherine [name] is a 45 year old female with a history of headaches and fibromyalgia. She  returns today for follow-up. She is currently taking gabapentin 600 mg 3 times a day. She feels that this has helped some but has not resolved her pain. She continues to have daily headaches. Her headaches are holoacranial. She confirms photophobia and phonophobia. She also has nausea but denies vomiting. Patient states that with her fibromyalgia she has pain all over. The patient also feels that she has memory loss with her headaches. She states that she can be in conversation and lose her train of thought. She also has trouble remembering conversations. At home she is able to complete all ADLs independently. Patient states that she typically has to write everything down. She drives but has gotten off s----everal times. She returns today for an evaluation.  HISTORY9/30/16: Katherine [name] is a 45 year old right-handed black female with a lifelong history of headache. She began having severe headaches when she was around 69 or 45 years old, but the headaches have been very severe, essentially daily for greater than 10 years. The patient has not had a day without headache in greater than 5 years. She indicates that the headaches are throughout the head with a pressure sensation, she may have some nausea and vomiting, visual blurring, dizziness, and some occasional neck stiffness. She has some intermittent tingling in the fingers of the hands, she denies any numbness elsewhere, she denies any weakness of the extremities. She indicates that bright lights, talking too long, and stress will bring on the headache. The patient indicates that she is sleeping fairly well. She has been followed through a headache center  previously, she cannot remember much about any of the medication she was tried on before, she believes that she was on Topamax. She had one session of Botox injections, but she did not like the needles and did not continue the therapy. The patient denies any issues controlling the bowels or the bladder, she has  some slight imbalance, no falls. She has had one episode of syncope 5 years ago. She is sent to this office for an evaluation. A CT scan of the head has been ordered, not yet done. The patient also reports a history of fibromyalgia. She indicates that she has never been placed on any medications for fibromyalgia previously.  REVIEW OF SYSTEMS: Out of a complete 14 system review of symptoms, the patient complains only of the following symptoms, and all other reviewed systems are negative.  Eye itching, light sensitivity, ringing in ears, abdominal pain, food allergies, memory loss, headache, numbness, back pain  ALLERGIES: Allergies  Allergen Reactions  . Morphine Anaphylaxis, Hives and Swelling  . Shellfish Allergy Anaphylaxis  . Penicillins Other (See Comments)    Yeast infection  . Codeine Hives  . Hydrocodone Hives  . Tomato Rash    HOME MEDICATIONS: Outpatient Medications Prior to Visit  Medication Sig Dispense Refill  . fluticasone (FLONASE) 50 MCG/ACT nasal spray Place 2 sprays into both nostrils daily. 16 g 3  . gabapentin (NEURONTIN) 300 MG capsule TAKE 3 CAPSULES (900 MG TOTAL) BY MOUTH 3 (THREE) TIMES DAILY. 270 capsule 11  . labetalol (NORMODYNE) 100 MG tablet Take 1 tablet (100 mg total) by mouth 2 (two) times daily. 60 tablet 6  . omeprazole (PRILOSEC) 20 MG capsule Take 1 capsule (20 mg total) by mouth 2 (two) times daily before a meal. 60 capsule 3   No facility-administered medications prior to visit.     PAST MEDICAL HISTORY: Past Medical History:  Diagnosis Date  . Anxiety   . Asthma   . Chest pressure   . Fibromyalgia   . Hot flashes   . Migraine   . Ovarian cyst   . Palpitations   . Pneumonia    with cavitation of left lower lobe    PAST SURGICAL HISTORY: Past Surgical History:  Procedure Laterality Date  . TUBAL LIGATION  1993    FAMILY HISTORY: Family History  Problem Relation Age of Onset  . Migraines Daughter   . Hypertension Mother   .  Heart disease Mother   . Hypertension Father   . Heart disease Father   . Breast cancer Sister 66    deceased at age 45  . Migraines Son   . Hypertension    . Colon cancer Neg Hx   . Colon polyps Neg Hx   . Diabetes Neg Hx   . Kidney disease Neg Hx   . Liver disease Neg Hx   . Heart attack Neg Hx   . Stroke Neg Hx     SOCIAL HISTORY: Social History   Social History  . Marital status: Married    Spouse name: N/A  . Number of children: 2  . Years of education: 15   Occupational History  . unemployed    Social History Main Topics  . Smoking status: Current Some Day Smoker    Packs/day: 0.05    Types: Cigarettes  . Smokeless tobacco: Never Used     Comment: form given 01-20-13  . Alcohol use No  . Drug use: No     Comment: pt states she has  d/c marijuana  . Sexual activity: Yes    Birth control/ protection: Surgical   Other Topics Concern  . Not on file   Social History Narrative   On disability for heart condition.  Pt. Was non-specific.       Patient does not drink caffeine.   Patient is ambidextrous, but mostly right handed.       PHYSICAL EXAM  Vitals:   07/26/16 1116  BP: 110/73  Pulse: 67  Weight: 150 lb 12.8 oz (68.4 kg)   Body mass index is 25.09 kg/m.   MMSE - Mini Mental State Exam 07/26/2016 04/25/2016 10/25/2015  Orientation to time 5 4 5   Orientation to Place 3 5 4   Registration 3 3 3   Attention/ Calculation 5 2 2   Recall 2 1 2   Language- name 2 objects 2 2 2   Language- repeat 1 1 1   Language- follow 3 step command 3 3 3   Language- read & follow direction 1 1 1   Write a sentence 1 1 1   Copy design 1 0 1  Total score 27 23 25      Generalized: Well developed, in no acute distress   Neurological examination  Mentation: Alert oriented to time, place, history taking. Follows all commands speech and language fluent Cranial nerve II-XII: Pupils were equal round reactive to light. Extraocular movements were full, visual field were full on  confrontational test. Facial sensation and strength were normal. Uvula tongue midline. Head turning and shoulder shrug  were normal and symmetric. Motor: The motor testing reveals 5 over 5 strength of all 4 extremities. Good symmetric motor tone is noted throughout.  Sensory: Sensory testing is intact to soft touch on all 4 extremities. No evidence of extinction is noted.  Coordination: Cerebellar testing reveals good finger-nose-finger and heel-to-shin bilaterally.  Gait and station: Gait is normal. Tandem gait is normal. Romberg is negative. No drift is seen.  Reflexes: Deep tendon reflexes are symmetric and normal bilaterally.   DIAGNOSTIC DATA (LABS, IMAGING, TESTING) - I reviewed patient records, labs, notes, testing and imaging myself where available.  Lab Results  Component Value Date   WBC 5.9 04/18/2015   HGB 12.9 04/18/2015   HCT 48.5 (H) 04/18/2015   MCV 105.0 (H) 04/18/2015   PLT 180 04/18/2015      Component Value Date/Time   NA 138 04/18/2015 1422   K 4.3 04/18/2015 1422   CL 108 04/18/2015 1422   CO2 26 04/18/2015 1422   GLUCOSE 87 04/18/2015 1422   BUN 7 04/18/2015 1422   CREATININE 0.58 04/18/2015 1422   CREATININE 0.60 09/05/2013 1459   CALCIUM 9.1 04/18/2015 1422   PROT 6.8 04/18/2015 1228   ALBUMIN 3.8 04/18/2015 1228   AST 18 04/18/2015 1228   ALT 13 (L) 04/18/2015 1228   ALKPHOS 61 04/18/2015 1228   BILITOT 0.5 04/18/2015 1228   GFRNONAA >60 04/18/2015 1422   GFRNONAA >89 09/05/2013 1459   GFRAA >60 04/18/2015 1422   GFRAA >89 09/05/2013 1459       ASSESSMENT AND PLAN 45 y.o. year old female  has a past medical history of Anxiety; Asthma; Chest pressure; Fibromyalgia; Hot flashes; Migraine; Ovarian cyst; Palpitations; and Pneumonia. here with;  1. Fibromyalgia 2. Headache 3. Memory disturbance  Overall the patient has remained stable. She continues to get good benefit with gabapentin 900 mg 3 times a day. Her memory score has improved although  she continues to notice changes. I will put another referral in for neuropsychological testing.  The patient is amenable to this plan. She will follow-up in 6 months or sooner if needed.     Ward Givens, MSN, NP-C 07/26/2016, 11:11 AM [facility] [address] [place], [place] [address] [phone]

## 2016-08-02 ENCOUNTER — Ambulatory Visit (INDEPENDENT_AMBULATORY_CARE_PROVIDER_SITE_OTHER): Payer: Medicaid Other | Admitting: Obstetrics & Gynecology

## 2016-08-02 ENCOUNTER — Encounter: Payer: Self-pay | Admitting: Obstetrics & Gynecology

## 2016-08-02 ENCOUNTER — Other Ambulatory Visit (HOSPITAL_COMMUNITY)
Admission: RE | Admit: 2016-08-02 | Discharge: 2016-08-02 | Disposition: A | Discharge status: Home / Self Care | Payer: Medicaid Other | Source: Ambulatory Visit | Attending: Obstetrics & Gynecology | Admitting: Obstetrics & Gynecology

## 2016-08-02 VITALS — BP 113/74 | HR 55 | Ht 65.0 in | Wt 151.0 lb

## 2016-08-02 DIAGNOSIS — Z1231 Encounter for screening mammogram for malignant neoplasm of breast: Secondary | ICD-10-CM

## 2016-08-02 DIAGNOSIS — Z113 Encounter for screening for infections with a predominantly sexual mode of transmission: Secondary | ICD-10-CM | POA: Insufficient documentation

## 2016-08-02 DIAGNOSIS — Z01419 Encounter for gynecological examination (general) (routine) without abnormal findings: Secondary | ICD-10-CM

## 2016-08-02 DIAGNOSIS — Z124 Encounter for screening for malignant neoplasm of cervix: Secondary | ICD-10-CM

## 2016-08-02 DIAGNOSIS — Z1151 Encounter for screening for human papillomavirus (HPV): Secondary | ICD-10-CM | POA: Diagnosis not present

## 2016-08-02 DIAGNOSIS — N83209 Unspecified ovarian cyst, unspecified side: Secondary | ICD-10-CM | POA: Diagnosis not present

## 2016-08-02 MED ORDER — IBUPROFEN 800 MG PO TABS: 800.0000 mg | ORAL_TABLET | Freq: Three times a day (TID) | ORAL | 3 refills | Status: DC | PRN

## 2016-08-02 NOTE — Patient Instructions (Signed)
Ovarian Cyst An ovarian cyst is a fluid-filled sac that forms on an ovary. The ovaries are small organs that produce eggs in women. Various types of cysts can form on the ovaries. Most are not cancerous. Many do not cause problems, and they often go away on their own. Some may cause symptoms and require treatment. Common types of ovarian cysts include:  Functional cysts--These cysts may occur every month during the menstrual cycle. This is normal. The cysts usually go away with the next menstrual cycle if the woman does not get pregnant. Usually, there are no symptoms with a functional cyst.  Endometrioma cysts--These cysts form from the tissue that lines the uterus. They are also called "chocolate cysts" because they become filled with blood that turns brown. This type of cyst can cause pain in the lower abdomen during intercourse and with your menstrual period.  Cystadenoma cysts--This type develops from the cells on the outside of the ovary. These cysts can get very big and cause lower abdomen pain and pain with intercourse. This type of cyst can twist on itself, cut off its blood supply, and cause severe pain. It can also easily rupture and cause a lot of pain.  Dermoid cysts--This type of cyst is sometimes found in both ovaries. These cysts may contain different kinds of body tissue, such as skin, teeth, hair, or cartilage. They usually do not cause symptoms unless they get very big.  Theca lutein cysts--These cysts occur when too much of a certain hormone (human chorionic gonadotropin) is produced and overstimulates the ovaries to produce an egg. This is most common after procedures used to assist with the conception of a baby (in vitro fertilization). CAUSES   Fertility drugs can cause a condition in which multiple large cysts are formed on the ovaries. This is called ovarian hyperstimulation syndrome.  A condition called polycystic ovary syndrome can cause hormonal imbalances that can lead to  nonfunctional ovarian cysts. SIGNS AND SYMPTOMS  Many ovarian cysts do not cause symptoms. If symptoms are present, they may include:  Pelvic pain or pressure.  Pain in the lower abdomen.  Pain during sexual intercourse.  Increasing girth (swelling) of the abdomen.  Abnormal menstrual periods.  Increasing pain with menstrual periods.  Stopping having menstrual periods without being pregnant. DIAGNOSIS  These cysts are commonly found during a routine or annual pelvic exam. Tests may be ordered to find out more about the cyst. These tests may include:  Ultrasound.  X-ray of the pelvis.  CT scan.  MRI.  Blood tests. TREATMENT  Many ovarian cysts go away on their own without treatment. Your health care provider may want to check your cyst regularly for 2-3 months to see if it changes. For women in menopause, it is particularly important to monitor a cyst closely because of the higher rate of ovarian cancer in menopausal women. When treatment is needed, it may include any of the following:  A procedure to drain the cyst (aspiration). This may be done using a long needle and ultrasound. It can also be done through a laparoscopic procedure. This involves using a thin, lighted tube with a tiny camera on the end (laparoscope) inserted through a small incision.  Surgery to remove the whole cyst. This may be done using laparoscopic surgery or an open surgery involving a larger incision in the lower abdomen.  Hormone treatment or birth control pills. These methods are sometimes used to help dissolve a cyst. HOME CARE INSTRUCTIONS   Only take over-the-counter   or prescription medicines as directed by your health care provider.  Follow up with your health care provider as directed.  Get regular pelvic exams and Pap tests. SEEK MEDICAL CARE IF:   Your periods are late, irregular, or painful, or they stop.  Your pelvic pain or abdominal pain does not go away.  Your abdomen becomes  larger or swollen.  You have pressure on your bladder or trouble emptying your bladder completely.  You have pain during sexual intercourse.  You have feelings of fullness, pressure, or discomfort in your stomach.  You lose weight for no apparent reason.  You feel generally ill.  You become constipated.  You lose your appetite.  You develop acne.  You have an increase in body and facial hair.  You are gaining weight, without changing your exercise and eating habits.  You think you are pregnant. SEEK IMMEDIATE MEDICAL CARE IF:   You have increasing abdominal pain.  You feel sick to your stomach (nauseous), and you throw up (vomit).  You develop a fever that comes on suddenly.  You have abdominal pain during a bowel movement.  Your menstrual periods become heavier than usual. MAKE SURE YOU:  Understand these instructions.  Will watch your condition.  Will get help right away if you are not doing well or get worse.   This information is not intended to replace advice given to you by your health care provider. Make sure you discuss any questions you have with your health care provider.   Document Released: 09/11/2005 Document Revised: 09/16/2013 Document Reviewed: 05/19/2013 Elsevier Interactive Patient Education 2016 [employer]  

## 2016-08-02 NOTE — Progress Notes (Signed)
Subjective:     Katherine [name] is a 44 y.o. female here for a routine exam.  G2P2 SVD x2. Pt s/p BTL.  LMP currently Current complaints:pt reports pain in her pelvis from her 'cyst'  She was prev dx'd with a suspected 4.5 cm right ov hemorrhagic cyst in a prior US at an outside facility. Pt reports intermittent pain from this and wants to have it removed if possible.   Pt is sexually active with one partner but, would like an STI screen because 'they could be with other people'  Gynecologic History Patient's last menstrual period was 08/01/2016. Contraception: tubal ligation Last Pap:06/2013 neg hrHPV. Results were: normal Last mammogram: 06/2013. Results were: normal  Obstetric History OB History  Gravida Para Term Preterm AB Living  2 2 1 1   2   SAB TAB Ectopic Multiple Live Births               # Outcome Date GA Lbr Len/2nd Weight Sex Delivery Anes PTL Lv  2 Preterm           1 Term              The following portions of the patient's history were reviewed and updated as appropriate: allergies, current medications, past family history, past medical history, past social history, past surgical history and problem list.  Review of Systems Pertinent items are noted in HPI.    Objective:  BP 113/74   Pulse (!) 55   Ht 5\' 5"  (1.651 m)   Wt 151 lb (68.5 kg)   LMP 08/01/2016   BMI 25.13 kg/m  General Appearance:    Alert, cooperative, no distress, appears stated age  Head:    Normocephalic, without obvious abnormality, atraumatic  Eyes:    conjunctiva/corneas clear, EOM's intact, both eyes  Ears:    Normal external ear canals, both ears  Nose:   Nares normal, septum midline, mucosa normal, no drainage    or sinus tenderness  Throat:   Lips, mucosa, and tongue normal; teeth and gums normal  Neck:   Supple, symmetrical, trachea midline, no adenopathy;    thyroid:  no enlargement/tenderness/nodules  Back:     Symmetric, no curvature, ROM normal, no CVA tenderness  Lungs:     Clear to  auscultation bilaterally, respirations unlabored  Chest Wall:    No tenderness or deformity   Heart:    Regular rate and rhythm, S1 and S2 normal, no murmur, rub   or gallop  Breast Exam:    No tenderness, masses, or nipple abnormality  Abdomen:     Soft, non-tender, bowel sounds active all four quadrants,    no masses, no organomegaly  Genitalia:    GU: EGBUS: no lesions Vagina: + blood in vault Cervix: no lesion; no mucopurulent d/c; no CMT Uterus/adnexa: small, mobile; there is an adnexal mass that appears to be on the left side although it could be on the right with the uterus shifted to the left!  It is mobile but, tender.  The mass appears to be 6-8 cm      Extremities:   Extremities normal, atraumatic, no cyanosis or edema  Pulses:   2+ and symmetric all extremities  Skin:   Skin color, texture, turgor normal, no rashes or lesions     Assessment:    Healthy female exam.   Adnexal mass- pt had a prior hemorrhagic cyst that was noted 7 months prev however, it appear to be persistent.  STI screen   Plan:  1. Well woman exam with routine gynecological exam  - Cytology - PAP - RPR - HIV antibody (with reflex) - Hepatitis B Surface AntiGEN  2. Visit for screening mammogram  - MM Digital Screening; Future  3. Cyst of ovary, unspecified laterality  - CA 125 - ibuprofen (ADVIL,MOTRIN) 800 MG tablet; Take 1 tablet (800 mg total) by mouth 3 (three) times daily with meals as needed for headache or moderate pain.  Dispense: 30 tablet; Refill: 3 - US Pelvis Complete; Future - US Transvaginal Non-OB; Future  Pt to f/iu in 2 weeks or sooner prn  Have discussed with her that if the cyst is persistent I would recommend surgical removal.  Antowan Samford L. Harraway-Smith, M.D., Cherlynn June

## 2016-08-03 LAB — HIV ANTIBODY (ROUTINE TESTING W REFLEX): HIV: NONREACTIVE

## 2016-08-03 LAB — RPR

## 2016-08-03 LAB — HEPATITIS B SURFACE ANTIGEN: HEP B S AG: NEGATIVE

## 2016-08-03 LAB — CA 125: CA 125: 11 U/mL (ref <35)

## 2016-08-07 ENCOUNTER — Ambulatory Visit
Admission: RE | Admit: 2016-08-07 | Discharge: 2016-08-07 | Disposition: A | Discharge status: Home / Self Care | Payer: Medicaid Other | Source: Ambulatory Visit | Attending: Obstetrics & Gynecology | Admitting: Obstetrics & Gynecology

## 2016-08-07 DIAGNOSIS — Z1231 Encounter for screening mammogram for malignant neoplasm of breast: Secondary | ICD-10-CM

## 2016-08-07 LAB — CYTOLOGY - PAP
Chlamydia: NEGATIVE
DIAGNOSIS: NEGATIVE
HPV: NOT DETECTED
Neisseria Gonorrhea: NEGATIVE
TRICH (WINDOWPATH): NEGATIVE

## 2016-08-08 ENCOUNTER — Ambulatory Visit (HOSPITAL_COMMUNITY)
Admission: RE | Admit: 2016-08-08 | Discharge: 2016-08-08 | Disposition: A | Discharge status: Home / Self Care | Payer: Medicaid Other | Source: Ambulatory Visit | Attending: Obstetrics & Gynecology | Admitting: Obstetrics & Gynecology

## 2016-08-08 DIAGNOSIS — D259 Leiomyoma of uterus, unspecified: Secondary | ICD-10-CM | POA: Diagnosis not present

## 2016-08-08 DIAGNOSIS — N83209 Unspecified ovarian cyst, unspecified side: Secondary | ICD-10-CM

## 2016-08-08 DIAGNOSIS — N83201 Unspecified ovarian cyst, right side: Secondary | ICD-10-CM | POA: Diagnosis not present

## 2016-08-14 ENCOUNTER — Other Ambulatory Visit: Payer: Self-pay | Admitting: Specialist

## 2016-08-14 ENCOUNTER — Ambulatory Visit
Admission: RE | Admit: 2016-08-14 | Discharge: 2016-08-14 | Disposition: A | Discharge status: Home / Self Care | Payer: Medicaid Other | Source: Ambulatory Visit | Attending: Specialist | Admitting: Specialist

## 2016-08-14 DIAGNOSIS — R928 Other abnormal and inconclusive findings on diagnostic imaging of breast: Secondary | ICD-10-CM

## 2016-08-23 ENCOUNTER — Telehealth: Payer: Self-pay | Admitting: *Deleted

## 2016-08-23 NOTE — Telephone Encounter (Signed)
Per Dr. Ihor Dow call patient to notify us was normal, ovarian cyst has resolved.  Needs follow up imaging for abnormal mammogram- per review has now had breast US and dx mammo done- needs [place] in 6 months- the breast center will send her a reminder call in 6 months.   I called patient and left a message we are calling with some information- please call our office.

## 2016-08-23 NOTE — Telephone Encounter (Signed)
-----   Message from Lavonia Drafts, MD sent at 08/15/2016  9:14 AM EST ----- Please call pt. Her [place] was normal. Ovarian cyst has resolved.  Please let pt know that she needs further imaging of her breast.  Please assist her with getting this scheduled.  Thx, clh-S

## 2016-08-24 NOTE — Telephone Encounter (Signed)
Pt returned our call and I informed her of [place] results showing that ovarian cyst has resolved.  Pt was also advised that she will need a follow up breast US in 6 months to evaluate the breast cysts. No further treatment is indicated at this time. Pt was also informed of all lab results performed on 11/8. She requested copies of all of her test results and I advised that she will need to come in and sign ROI form to obtain these copies.  Pt voiced understanding. Marland Kitchen

## 2016-08-29 ENCOUNTER — Encounter: Payer: Medicaid Other | Admitting: Psychology

## 2016-09-23 ENCOUNTER — Encounter (HOSPITAL_COMMUNITY): Payer: Self-pay | Admitting: Emergency Medicine

## 2016-09-23 ENCOUNTER — Emergency Department (HOSPITAL_COMMUNITY): Payer: Medicaid Other

## 2016-09-23 ENCOUNTER — Emergency Department (HOSPITAL_COMMUNITY)
Admission: EM | Admit: 2016-09-23 | Discharge: 2016-09-23 | Disposition: A | Discharge status: Home / Self Care | Payer: Medicaid Other | Attending: Emergency Medicine | Admitting: Emergency Medicine

## 2016-09-23 DIAGNOSIS — F17210 Nicotine dependence, cigarettes, uncomplicated: Secondary | ICD-10-CM | POA: Insufficient documentation

## 2016-09-23 DIAGNOSIS — R0789 Other chest pain: Secondary | ICD-10-CM | POA: Insufficient documentation

## 2016-09-23 DIAGNOSIS — R10819 Abdominal tenderness, unspecified site: Secondary | ICD-10-CM | POA: Diagnosis not present

## 2016-09-23 DIAGNOSIS — J45909 Unspecified asthma, uncomplicated: Secondary | ICD-10-CM | POA: Diagnosis not present

## 2016-09-23 DIAGNOSIS — R079 Chest pain, unspecified: Secondary | ICD-10-CM

## 2016-09-23 LAB — CBC
HCT: 41.7 % (ref 36.0–46.0)
Hemoglobin: 14 g/dL (ref 12.0–15.0)
MCH: 27.8 pg (ref 26.0–34.0)
MCHC: 33.6 g/dL (ref 30.0–36.0)
MCV: 82.7 fL (ref 78.0–100.0)
Platelets: 221 10*3/uL (ref 150–400)
RBC: 5.04 MIL/uL (ref 3.87–5.11)
RDW: 14.9 % (ref 11.5–15.5)
WBC: 5.6 10*3/uL (ref 4.0–10.5)

## 2016-09-23 LAB — BASIC METABOLIC PANEL
Anion gap: 8 (ref 5–15)
BUN: 11 mg/dL (ref 6–20)
CO2: 25 mmol/L (ref 22–32)
Calcium: 9.6 mg/dL (ref 8.9–10.3)
Chloride: 107 mmol/L (ref 101–111)
Creatinine, Ser: 0.64 mg/dL (ref 0.44–1.00)
GFR calc Af Amer: >60 mL/min (ref >60)
GFR calc non Af Amer: >60 mL/min (ref >60)
Glucose, Bld: 112 mg/dL — ABNORMAL HIGH (ref 65–99)
Potassium: 3.8 mmol/L (ref 3.5–5.1)
Sodium: 140 mmol/L (ref 135–145)

## 2016-09-23 LAB — URINALYSIS, ROUTINE W REFLEX MICROSCOPIC
Bilirubin Urine: NEGATIVE
Glucose, UA: NEGATIVE mg/dL
Ketones, ur: NEGATIVE mg/dL
Leukocytes, UA: NEGATIVE
Nitrite: NEGATIVE
Protein, ur: 30 mg/dL — AB
Specific Gravity, Urine: 1.028 (ref 1.005–1.030)
pH: 5 (ref 5.0–8.0)

## 2016-09-23 LAB — I-STAT TROPONIN, ED: Troponin i, poc: 0 ng/mL (ref 0.00–0.08)

## 2016-09-23 LAB — PREGNANCY, URINE: Preg Test, Ur: NEGATIVE

## 2016-09-23 MED ORDER — ONDANSETRON 4 MG PO TBDP
4.0000 mg | ORAL_TABLET | Freq: Once | ORAL | Status: AC
Start: 2016-09-23 — End: 2016-09-23
  Administered 2016-09-23: 4 mg via ORAL
  Filled 2016-09-23: qty 1

## 2016-09-23 MED ORDER — GI COCKTAIL ~~LOC~~
30.0000 mL | Freq: Once | ORAL | Status: AC
  Administered 2016-09-23: 30 mL via ORAL
  Filled 2016-09-23: qty 30

## 2016-09-23 NOTE — ED Provider Notes (Signed)
[facility] DEPT Provider Note   CSN: [id number] Arrival date & time: 09/23/16  1008  By signing my name below, I, Sonum Patel, attest that this documentation has been prepared under the direction and in the presence of Virgel Manifold, MD. Electronically Signed: Sonum Patel, Education administrator. 09/23/16. 12:28 PM.  History   Chief Complaint Chief Complaint  Patient presents with  . Chest Pain  . Abdominal Pain  . Vaginal Bleeding    The history is provided by the patient. No language interpreter was used.     HPI Comments: Katherine [name] is a 45 y.o. female who presents to the Emergency Department complaining of sharp left anterior and left lateral chest pain that began last night while lying down. She states the left anterior chest pain is constant and the left lateral chest pain is intermittent. She rates the pain as 10/10 last night but 8/10 currently. She states the pain is worse with movement and deep breathing; states standing causes mild lightheadedness. She has associated non-productive cough and mild SOB. She denies similar symptoms in the past. She denies leg pain, leg swelling.   She also complains of abdominal pain with associated nausea, subjective fever and chills. She denies vomiting.   She also complains of vaginal bleeding that began a few days ago. She states her LNMP was at the beginning of December and states it is unusual for her to be bleeding now. She states her menstrual bleeding is usually not this heavy.   Past Medical History:  Diagnosis Date  . Anxiety   . Asthma   . Chest pressure   . Fibromyalgia   . Hot flashes   . Migraine   . Ovarian cyst   . Palpitations   . Pneumonia    with cavitation of left lower lobe    Patient Active Problem List   Diagnosis Date Noted  . Nocturia 08/09/2015  . Fibromyalgia 06/25/2015  . GERD (gastroesophageal reflux disease) 06/16/2013  . Migraine 06/16/2013  . Atrial tachycardia, paroxysmal ([place]) 03/31/2013  . Headache  12/20/2012  . Syncope 06/20/2012  . Leiomyoma of uterus, unspecified 03/26/2012  . Family history of breast cancer 01/22/2012  . Seasonal and perennial allergic rhinitis 07/18/2010  . HAIR LOSS 07/04/2010  . ANXIETY DISORDER, SITUATIONAL, MILD 11/17/2009  . CHEST PAIN, LEFT 10/12/2009  . DYSURIA 08/26/2009  . MAMMOGRAM, ABNORMAL, RIGHT, HX OF 04/09/2009  . TOBACCO ABUSE 03/17/2009  . DECREASED HEARING, RIGHT EAR 10/27/2008  . BACK PAIN, LUMBAR 06/06/2007  . HOT FLASHES 07/18/2006    Past Surgical History:  Procedure Laterality Date  . TUBAL LIGATION  1993    OB History    Gravida Para Term Preterm AB Living   2 2 1 1   2    SAB TAB Ectopic Multiple Live Births                   Home Medications    Prior to Admission medications   Medication Sig Start Date End Date Taking? Authorizing Provider  albuterol (PROVENTIL HFA;VENTOLIN HFA) 108 (90 Base) MCG/ACT inhaler Inhale 2 puffs into the lungs every 6 (six) hours as needed for wheezing or shortness of breath.   Yes Historical Provider, MD  Camphor-Eucalyptus-Menthol (VICKS VAPORUB EX) Apply 1 application topically as needed (for breathing).   Yes Historical Provider, MD  DEXILANT 60 MG capsule Take 60 mg by mouth daily. 06/27/16  Yes Historical Provider, MD  diltiazem (CARDIZEM CD) 240 MG 24 hr capsule Take 240 mg by  mouth daily.  06/28/16  Yes Historical Provider, MD  meloxicam (MOBIC) 15 MG tablet Take 15 mg by mouth daily. 08/09/16  Yes Historical Provider, MD  metoCLOPramide (REGLAN) 10 MG tablet Take 10 mg by mouth 4 (four) times daily. 08/09/16  Yes Historical Provider, MD  omeprazole (PRILOSEC) 20 MG capsule Take 1 capsule (20 mg total) by mouth 2 (two) times daily before a meal. 08/09/15  Yes Josalyn Funches, MD  promethazine (PHENERGAN) 25 MG tablet Take 25 mg by mouth daily. 08/09/16  Yes Historical Provider, MD  ranitidine (ZANTAC) 150 MG tablet Take 150 mg by mouth at bedtime. 07/26/16  Yes Historical Provider, MD    sucralfate (CARAFATE) 1 g tablet Take 1 g by mouth every morning. 08/09/16  Yes Historical Provider, MD  SUMAtriptan (IMITREX) 50 MG tablet Take 50 mg by mouth as needed for migraine or headache.  08/14/16  Yes Historical Provider, MD  fluticasone (FLONASE) 50 MCG/ACT nasal spray Place 2 sprays into both nostrils daily. Patient not taking: Reported on 09/23/2016 06/14/15   [name], MD  gabapentin (NEURONTIN) 300 MG capsule TAKE 3 CAPSULES (900 MG TOTAL) BY MOUTH 3 (THREE) TIMES DAILY. Patient not taking: Reported on 09/23/2016 05/30/16   Ward Givens, NP  ibuprofen (ADVIL,MOTRIN) 800 MG tablet Take 1 tablet (800 mg total) by mouth 3 (three) times daily with meals as needed for headache or moderate pain. Patient not taking: Reported on 09/23/2016 08/02/16   Lavonia Drafts, MD  labetalol (NORMODYNE) 100 MG tablet Take 1 tablet (100 mg total) by mouth 2 (two) times daily. Patient not taking: Reported on 09/23/2016 10/14/15   Fay Records, MD    Family History Family History  Problem Relation Age of Onset  . Migraines Daughter   . Hypertension Mother   . Heart disease Mother   . Hypertension Father   . Heart disease Father   . Breast cancer Sister 81    deceased at age 77  . Migraines Son   . Hypertension    . Colon cancer Neg Hx   . Colon polyps Neg Hx   . Diabetes Neg Hx   . Kidney disease Neg Hx   . Liver disease Neg Hx   . Heart attack Neg Hx   . Stroke Neg Hx     Social History Social History  Substance Use Topics  . Smoking status: Current Some Day Smoker    Packs/day: 0.05    Types: Cigarettes  . Smokeless tobacco: Current User     Comment: smokes only when you get strress  . Alcohol use No     Allergies   Morphine; Shellfish allergy; Penicillins; Codeine; Hydrocodone; and Tomato   Review of Systems Review of Systems  A complete 10 system review of systems was obtained and all systems are negative except as noted in the HPI and PMH.   Physical  Exam Updated Vital Signs BP 108/83 (BP Location: Right Arm)   Pulse 78   Temp 98.1 F (36.7 C) (Oral)   Resp 17   Ht 5\' 5"  (1.651 m)   Wt 146 lb 9 oz (66.5 kg)   LMP 08/25/2016   SpO2 98%   BMI 24.39 kg/m   Physical Exam  Constitutional: She is oriented to person, place, and time. She appears well-developed and well-nourished. No distress.  HENT:  Head: Normocephalic and atraumatic.  Eyes: EOM are normal.  Neck: Normal range of motion.  Cardiovascular: Normal rate, regular rhythm and normal heart sounds.  Pulmonary/Chest: Effort normal and breath sounds normal. She exhibits tenderness.  Tenderness along left lateral chest wall.   Abdominal: Soft. She exhibits no distension. There is tenderness. There is no rebound and no guarding.  Mild suprapubic tenderness without rebound or guarding   Musculoskeletal: Normal range of motion.  Neurological: She is alert and oriented to person, place, and time.  Skin: Skin is warm and dry.  Psychiatric: She has a normal mood and affect. Judgment normal.  Nursing note and vitals reviewed.    ED Treatments / Results  DIAGNOSTIC STUDIES: Oxygen Saturation is 98% on RA, normal by my interpretation.    COORDINATION OF CARE: 12:18 PM Discussed treatment plan with pt at bedside and pt agreed to plan.   Labs (all labs ordered are listed, but only abnormal results are displayed) Labs Reviewed  BASIC METABOLIC PANEL - Abnormal; Notable for the following:       Result Value   Glucose, Bld 112 (*)    All other components within normal limits  CBC  I-STAT TROPOININ, ED    EKG  EKG Interpretation  Date/Time:  Saturday September 23 2016 10:13:14 EST Ventricular Rate:  74 PR Interval:  158 QRS Duration: 80 QT Interval:  378 QTC Calculation: 419 R Axis:   88 Text Interpretation:  Normal sinus rhythm with sinus arrhythmia Septal infarct , age undetermined Abnormal ECG similar to previous from 06/2014 Confirmed by Endora Teresi  MD, [place] [phone])  on 09/23/2016 11:43:32 AM       Radiology Dg Chest 2 View  Result Date: 09/23/2016 CLINICAL DATA:  Pt c/o chest pain the moves laterally down her left side, SOB, cough, and light headed since last night. Hx of asthma, palpitations and chest pressure. No surgical hx EXAM: CHEST  2 VIEW COMPARISON:  None. FINDINGS: Normal mediastinum and cardiac silhouette. Normal pulmonary vasculature. No evidence of effusion, infiltrate, or pneumothorax. No acute bony abnormality. IMPRESSION: Normal chest radiograph. Electronically Signed   By: Suzy Bouchard M.D.   On: 09/23/2016 11:06    Procedures Procedures (including critical care time)  Medications Ordered in ED Medications - No data to display   Initial Impression / Assessment and Plan / ED Course  I have reviewed the triage vital signs and the nursing notes.  Pertinent labs & imaging results that were available during my care of the patient were reviewed by me and considered in my medical decision making (see chart for details).  Clinical Course     45 show female with chest pain. Telemetry ACS, PE, dissection or serious infection. She also reports 3 vaginal bleeding. She seemed dynamically stable.  A course of medroxyprogesterone. Follow-up with gynecology otherwise. Return precautions discussed.  Final Clinical Impressions(s) / ED Diagnoses   Final diagnoses:  Chest pain, unspecified type    New Prescriptions New Prescriptions   No medications on file   I personally preformed the services scribed in my presence. The recorded information has been reviewed is accurate. Virgel Manifold, MD.     Virgel Manifold, MD 09/28/16 1420

## 2016-09-23 NOTE — ED Triage Notes (Signed)
Pt. Stated, I've had some pain on my left upper side and some chest pains.  I just got over a cold.

## 2016-09-23 NOTE — ED Notes (Signed)
Pt stable, ambulatory, states understanding of discharge instructions 

## 2016-09-29 ENCOUNTER — Inpatient Hospital Stay (HOSPITAL_COMMUNITY)
Admission: AD | Admit: 2016-09-29 | Discharge: 2016-09-29 | Disposition: A | Discharge status: Home / Self Care | Payer: Medicaid Other | Source: Ambulatory Visit | Attending: Obstetrics & Gynecology | Admitting: Obstetrics & Gynecology

## 2016-09-29 ENCOUNTER — Encounter (HOSPITAL_COMMUNITY): Payer: Self-pay

## 2016-09-29 DIAGNOSIS — D259 Leiomyoma of uterus, unspecified: Secondary | ICD-10-CM | POA: Insufficient documentation

## 2016-09-29 DIAGNOSIS — F419 Anxiety disorder, unspecified: Secondary | ICD-10-CM | POA: Insufficient documentation

## 2016-09-29 DIAGNOSIS — G43909 Migraine, unspecified, not intractable, without status migrainosus: Secondary | ICD-10-CM | POA: Insufficient documentation

## 2016-09-29 DIAGNOSIS — J45909 Unspecified asthma, uncomplicated: Secondary | ICD-10-CM | POA: Diagnosis not present

## 2016-09-29 DIAGNOSIS — N939 Abnormal uterine and vaginal bleeding, unspecified: Secondary | ICD-10-CM | POA: Insufficient documentation

## 2016-09-29 DIAGNOSIS — M797 Fibromyalgia: Secondary | ICD-10-CM | POA: Diagnosis not present

## 2016-09-29 DIAGNOSIS — F17210 Nicotine dependence, cigarettes, uncomplicated: Secondary | ICD-10-CM | POA: Insufficient documentation

## 2016-09-29 HISTORY — DX: Leiomyoma of uterus, unspecified: D25.9

## 2016-09-29 LAB — URINALYSIS, ROUTINE W REFLEX MICROSCOPIC
Bilirubin Urine: NEGATIVE
GLUCOSE, UA: NEGATIVE mg/dL
KETONES UR: NEGATIVE mg/dL
LEUKOCYTES UA: NEGATIVE
Nitrite: NEGATIVE
PROTEIN: NEGATIVE mg/dL
Specific Gravity, Urine: 1.003 — ABNORMAL LOW (ref 1.005–1.030)
WBC UA: NONE SEEN WBC/hpf (ref 0–5)
pH: 7 (ref 5.0–8.0)

## 2016-09-29 LAB — POCT PREGNANCY, URINE: PREG TEST UR: NEGATIVE

## 2016-09-29 LAB — CBC
HCT: 40.2 % (ref 36.0–46.0)
Hemoglobin: 13.4 g/dL (ref 12.0–15.0)
MCH: 27.7 pg (ref 26.0–34.0)
MCHC: 33.3 g/dL (ref 30.0–36.0)
MCV: 83.1 fL (ref 78.0–100.0)
Platelets: 146 10*3/uL — ABNORMAL LOW (ref 150–400)
RBC: 4.84 MIL/uL (ref 3.87–5.11)
RDW: 15.3 % (ref 11.5–15.5)
WBC: 3.6 10*3/uL — ABNORMAL LOW (ref 4.0–10.5)

## 2016-09-29 MED ORDER — MEGESTROL ACETATE 40 MG PO TABS: 40.0000 mg | ORAL_TABLET | Freq: Two times a day (BID) | ORAL | 1 refills | Status: DC

## 2016-09-29 NOTE — Discharge Instructions (Signed)
Uterine Fibroids Uterine fibroids are tissue masses (tumors) that can develop in the womb (uterus). They are also called leiomyomas. This type of tumor is not cancerous (benign) and does not spread to other parts of the body outside of the pelvic area, which is between the hip bones. Occasionally, fibroids may develop in the fallopian tubes, in the cervix, or on the support structures (ligaments) that surround the uterus. You can have one or many fibroids. Fibroids can vary in size, weight, and where they grow in the uterus. Some can become quite large. Most fibroids do not require medical treatment. What are the causes? A fibroid can develop when a single uterine cell keeps growing (replicating). Most cells in the human body have a control mechanism that keeps them from replicating without control. What are the signs or symptoms? Symptoms may include:  Heavy bleeding during your period.  Bleeding or spotting between periods.  Pelvic pain and pressure.  Bladder problems, such as needing to urinate more often (urinary frequency) or urgently.  Inability to reproduce offspring (infertility).  Miscarriages. How is this diagnosed? Uterine fibroids are diagnosed through a physical exam. Your health care provider may feel the lumpy tumors during a pelvic exam. Ultrasonography and an MRI may be done to determine the size, location, and number of fibroids. How is this treated? Treatment may include:  Watchful waiting. This involves getting the fibroid checked by your health care provider to see if it grows or shrinks. Follow your health care provider's recommendations for how often to have this checked.  Hormone medicines. These can be taken by mouth or given through an intrauterine device (IUD).  Surgery.  Removing the fibroids (myomectomy) or the uterus (hysterectomy).  Removing blood supply to the fibroids (uterine artery embolization). If fibroids interfere with your fertility and you  want to become pregnant, your health care provider may recommend having the fibroids removed. Follow these instructions at home:  Keep all follow-up visits as directed by your health care provider. This is important.  Take over-the-counter and prescription medicines only as told by your health care provider.  If you were prescribed a hormone treatment, take the hormone medicines exactly as directed.  Ask your health care provider about taking iron pills and increasing the amount of dark green, leafy vegetables in your diet. These actions can help to boost your blood iron levels, which may be affected by heavy menstrual bleeding.  Pay close attention to your period and tell your health care provider about any changes, such as:  Increased blood flow that requires you to use more pads or tampons than usual per month.  A change in the number of days that your period lasts per month.  A change in symptoms that are associated with your period, such as abdominal cramping or back pain. Contact a health care provider if:  You have pelvic pain, back pain, or abdominal cramps that cannot be controlled with medicines.  You have an increase in bleeding between and during periods.  You soak tampons or pads in a half hour or less.  You feel lightheaded, extra tired, or weak. Get help right away if:  You faint.  You have a sudden increase in pelvic pain. This information is not intended to replace advice given to you by your health care provider. Make sure you discuss any questions you have with your health care provider. Document Released: 09/08/2000 Document Revised: 05/11/2016 Document Reviewed: 03/10/2014 Elsevier Interactive Patient Education  2017 [place].  Abnormal Uterine  Bleeding Abnormal uterine bleeding means bleeding from the vagina that is not your normal menstrual period. This can be:  Bleeding or spotting between periods.  Bleeding after sex (sexual  intercourse).  Bleeding that is heavier or more than normal.  Periods that last longer than usual.  Bleeding after menopause. There are many problems that may cause this. Treatment will depend on the cause of the bleeding. Any kind of bleeding that is not normal should be reviewed by your doctor. Follow these instructions at home: Watch your condition for any changes. These actions may lessen any discomfort you are having:  Do not use tampons or douches as told by your doctor.  Change your pads often. You should get regular pelvic exams and Pap tests. Keep all appointments for tests as told by your doctor. Contact a doctor if:  You are bleeding for more than 1 week.  You feel dizzy at times. Get help right away if:  You pass out.  You have to change pads every 15 to 30 minutes.  You have belly pain.  You have a fever.  You become sweaty or weak.  You are passing large blood clots from the vagina.  You feel sick to your stomach (nauseous) and throw up (vomit). This information is not intended to replace advice given to you by your health care provider. Make sure you discuss any questions you have with your health care provider. Document Released: 07/09/2009 Document Revised: 02/17/2016 Document Reviewed: 04/10/2013 Elsevier Interactive Patient Education  2017 [employer].

## 2016-09-29 NOTE — MAU Provider Note (Signed)
History     CSN: [id number]  Arrival date and time: 09/29/16 [id number]   First Provider Initiated Contact with Patient 09/29/16 [phone]     Chief Complaint  Patient presents with  . Vaginal Bleeding   HPI Katherine [name] is a 46 y.o. female who presents for vaginal bleeding. Patient previously seen at [facility] for routine gyn exam & evaluation of ovarian cyst. Ultrasound showed resolution of cyst but persistence of uterine fibroids. Pt states she thinks she was told she had a fibroids a few years ago but has never had issues with them. Denies hx of AUB; previously had cycle every month that lasted 3 days at a time. LMP 12/1 that last 3 days; then began bleeding again 12/22. Reports bleeding every day since 12/22. Flow his heavier than a normal period for her. Has had to change pad 5 times per day; not saturating pads. Some abdominal cramping associated the the bleeding; rates pain 6/10. Has taken NSAIDS with mild relief. Had an episode of dizziness yesterday that resolved after sitting down; no dizziness since then.   Past Medical History:  Diagnosis Date  . Anxiety   . Asthma   . Chest pressure   . Fibromyalgia   . Hot flashes   . Migraine   . Ovarian cyst   . Palpitations   . Pneumonia    with cavitation of left lower lobe    Past Surgical History:  Procedure Laterality Date  . TUBAL LIGATION  1993    Family History  Problem Relation Age of Onset  . Migraines Daughter   . Hypertension Mother   . Heart disease Mother   . Hypertension Father   . Heart disease Father   . Breast cancer Sister 14    deceased at age 3  . Migraines Son   . Hypertension    . Colon cancer Neg Hx   . Colon polyps Neg Hx   . Diabetes Neg Hx   . Kidney disease Neg Hx   . Liver disease Neg Hx   . Heart attack Neg Hx   . Stroke Neg Hx     Social History  Substance Use Topics  . Smoking status: Current Some Day Smoker    Packs/day: 0.05    Types: Cigarettes  . Smokeless tobacco: Current User   Comment: smokes only when you get strress  . Alcohol use No    Allergies:  Allergies  Allergen Reactions  . Morphine Anaphylaxis, Hives and Swelling  . Shellfish Allergy Anaphylaxis  . Penicillins Other (See Comments)    Yeast infection  . Codeine Hives  . Hydrocodone Hives  . Tomato Rash    Prescriptions Prior to Admission  Medication Sig Dispense Refill Last Dose  . albuterol (PROVENTIL HFA;VENTOLIN HFA) 108 (90 Base) MCG/ACT inhaler Inhale 2 puffs into the lungs every 6 (six) hours as needed for wheezing or shortness of breath.   09/22/2016 at Unknown time  . Camphor-Eucalyptus-Menthol (VICKS VAPORUB EX) Apply 1 application topically as needed (for breathing).   09/22/2016 at Unknown time  . DEXILANT 60 MG capsule Take 60 mg by mouth daily.  3 09/23/2016 at Unknown time  . diltiazem (CARDIZEM CD) 240 MG 24 hr capsule Take 240 mg by mouth daily.   5 Past Week at Unknown time  . labetalol (NORMODYNE) 100 MG tablet Take 1 tablet (100 mg total) by mouth 2 (two) times daily. (Patient not taking: Reported on 09/23/2016) 60 tablet 6 Not Taking at Unknown time  .  meloxicam (MOBIC) 15 MG tablet Take 15 mg by mouth daily.  3 Past Week at Unknown time  . metoCLOPramide (REGLAN) 10 MG tablet Take 10 mg by mouth 4 (four) times daily.  3 Past Week at Unknown time  . omeprazole (PRILOSEC) 20 MG capsule Take 1 capsule (20 mg total) by mouth 2 (two) times daily before a meal. 60 capsule 3 09/23/2016 at Unknown time  . promethazine (PHENERGAN) 25 MG tablet Take 25 mg by mouth daily.  1 Past Week at Unknown time  . ranitidine (ZANTAC) 150 MG tablet Take 150 mg by mouth at bedtime.  12 Past Week at Unknown time  . sucralfate (CARAFATE) 1 g tablet Take 1 g by mouth every morning.  3 09/23/2016 at Unknown time  . SUMAtriptan (IMITREX) 50 MG tablet Take 50 mg by mouth as needed for migraine or headache.   3 Past Week at Unknown time    Review of Systems  Constitutional: Negative.   Cardiovascular:  Positive for palpitations. Negative for chest pain.  Gastrointestinal: Positive for abdominal pain. Negative for nausea and vomiting.  Genitourinary: Positive for vaginal bleeding.  Neurological: Positive for dizziness (x1 episode yesterday). Negative for headaches.   Physical Exam   Blood pressure 132/81, pulse (!) 59, temperature 97.7 F (36.5 C), resp. rate 16, height 5\' 5"  (1.651 m), weight 149 lb (67.6 kg), last menstrual period 09/15/2016.  Physical Exam  Nursing note and vitals reviewed. Constitutional: She is oriented to person, place, and time. She appears well-developed and well-nourished. No distress.  HENT:  Head: Normocephalic and atraumatic.  Eyes: Conjunctivae are normal. Right eye exhibits no discharge. Left eye exhibits no discharge. No scleral icterus.  Neck: Normal range of motion.  Cardiovascular: Normal rate, regular rhythm and normal heart sounds.   No murmur heard. Respiratory: Effort normal and breath sounds normal. No respiratory distress. She has no wheezes.  GI: Soft. She exhibits no distension. There is no tenderness.  Genitourinary: Uterus is enlarged. Cervix exhibits no motion tenderness. There is bleeding (minimal amount of dark red blood; no active bleeding) in the vagina.  Neurological: She is alert and oriented to person, place, and time.  Skin: Skin is warm and dry. She is not diaphoretic.  Psychiatric: She has a normal mood and affect. Her behavior is normal. Judgment and thought content normal.    MAU Course  Procedures Results for orders placed or performed during the hospital encounter of 09/29/16 (from the past 24 hour(s))  Urinalysis, Routine w reflex microscopic     Status: Abnormal   Collection Time: 09/29/16  9:09 AM  Result Value Ref Range   Color, Urine STRAW (A) YELLOW   APPearance CLEAR CLEAR   Specific Gravity, Urine 1.003 (L) 1.005 - 1.030   pH 7.0 5.0 - 8.0   Glucose, UA NEGATIVE NEGATIVE mg/dL   Hgb urine dipstick LARGE (A)  NEGATIVE   Bilirubin Urine NEGATIVE NEGATIVE   Ketones, ur NEGATIVE NEGATIVE mg/dL   Protein, ur NEGATIVE NEGATIVE mg/dL   Nitrite NEGATIVE NEGATIVE   Leukocytes, UA NEGATIVE NEGATIVE   RBC / HPF 0-5 0 - 5 RBC/hpf   WBC, UA NONE SEEN 0 - 5 WBC/hpf   Bacteria, UA RARE (A) NONE SEEN   Squamous Epithelial / LPF 0-5 (A) NONE SEEN  Pregnancy, urine POC     Status: None   Collection Time: 09/29/16  9:15 AM  Result Value Ref Range   Preg Test, Ur NEGATIVE NEGATIVE  CBC  Status: Abnormal   Collection Time: 09/29/16  9:46 AM  Result Value Ref Range   WBC 3.6 (L) 4.0 - 10.5 K/uL   RBC 4.84 3.87 - 5.11 MIL/uL   Hemoglobin 13.4 12.0 - 15.0 g/dL   HCT 40.2 36.0 - 46.0 %   MCV 83.1 78.0 - 100.0 fL   MCH 27.7 26.0 - 34.0 pg   MCHC 33.3 30.0 - 36.0 g/dL   RDW 15.3 11.5 - 15.5 %   Platelets 146 (L) 150 - 400 K/uL    MDM UPT negative U/a negative CBC -- hemoglobin stable  Assessment and Plan  A: 1. Uterine leiomyoma, unspecified location   2. Abnormal uterine bleeding (AUB)    P: Discharge home Rx megace F/u with [facility] for AUB & fibroid management Discussed reasons to return to [place] 09/29/2016, 9:20 AM

## 2016-09-29 NOTE — MAU Provider Note (Signed)
History     CSN: [id number]  Arrival date and time: 09/29/16 [id number]   First Provider Initiated Contact with Patient 09/29/16 [phone]      Chief Complaint  Patient presents with  . Vaginal Bleeding   HPI Katherine [name] is a 46 y.o. G29P1102 female with PMH significant for hemorrhagic cyst, noted to be resolved on 08/14/16 [place], and 2 uterine fibroids (anterior and left fundal), who presents today for evaluation of vaginal bleeding. Patient reports she her cycle had been normal, LMP 08/25/16. She her re[ports a moderate flow and that her cycle typically lasts 3-4 days. Patient reports she began bleeding outside of her usual cycle on Dec. 23 and the bleeding has not stopped. She reports this bleeding is heavier than her usual cycle, reporting ~4-5 pad changes each day. Patient reports lower abdominal cramping that comes and goes with bleeding. Cramps were not relieved by ibuprofen yesterday. Patient also reports increased urinary frequency over the past month. Patient reports feeling lightheaded and dizzy yesterday with some chills, nausea and palpitations. Patient denies any fevers, vomiting, abdominal pain, constipation, dysuria, or vaginal discharge.   OB History    Gravida Para Term Preterm AB Living   2 2 1 1   2    SAB TAB Ectopic Multiple Live Births                  Past Medical History:  Diagnosis Date  . Anxiety   . Asthma   . Fibromyalgia   . Hot flashes   . Migraine   . Palpitations   . Pneumonia    with cavitation of left lower lobe  . Uterine fibroid     Past Surgical History:  Procedure Laterality Date  . TUBAL LIGATION  1993    Family History  Problem Relation Age of Onset  . Migraines Daughter   . Hypertension Mother   . Heart disease Mother   . Hypertension Father   . Heart disease Father   . Breast cancer Sister 93    deceased at age 64  . Migraines Son   . Hypertension    . Colon cancer Neg Hx   . Colon polyps Neg Hx   . Diabetes Neg Hx   . Kidney disease  Neg Hx   . Liver disease Neg Hx   . Heart attack Neg Hx   . Stroke Neg Hx     Social History  Substance Use Topics  . Smoking status: Current Some Day Smoker    Packs/day: 0.05    Types: Cigarettes  . Smokeless tobacco: Current User     Comment: smokes only when you get strress  . Alcohol use No    Allergies:  Allergies  Allergen Reactions  . Morphine Anaphylaxis, Hives and Swelling  . Shellfish Allergy Anaphylaxis  . Penicillins Other (See Comments)    Yeast infection Has patient had a PCN reaction causing immediate rash, facial/tongue/throat swelling, SOB or lightheadedness with hypotension: no Has patient had a PCN reaction causing severe rash involving mucus membranes or skin necrosis: no Has patient had a PCN reaction that required hospitalization no Has patient had a PCN reaction occurring within the last 10 years:no If all of the above answers are "NO", then may proceed with Cephalosporin use.   . Codeine Hives  . Hydrocodone Hives  . Tomato Rash    Prescriptions Prior to Admission  Medication Sig Dispense Refill Last Dose  . albuterol (PROVENTIL HFA;VENTOLIN HFA) 108 (90 Base) MCG/ACT inhaler  Inhale 2 puffs into the lungs every 6 (six) hours as needed for wheezing or shortness of breath.   Past Week at Unknown time  . DEXILANT 60 MG capsule Take 60 mg by mouth daily.  3 09/29/2016 at Unknown time  . diltiazem (CARDIZEM CD) 240 MG 24 hr capsule Take 240 mg by mouth daily.   5 09/29/2016 at Unknown time  . labetalol (NORMODYNE) 100 MG tablet Take 1 tablet (100 mg total) by mouth 2 (two) times daily. 60 tablet 6 09/29/2016 at Unknown time  . meloxicam (MOBIC) 15 MG tablet Take 15 mg by mouth daily.  3 09/29/2016 at Unknown time  . metoCLOPramide (REGLAN) 10 MG tablet Take 10 mg by mouth 4 (four) times daily.  3 09/29/2016 at Unknown time  . omeprazole (PRILOSEC) 20 MG capsule Take 1 capsule (20 mg total) by mouth 2 (two) times daily before a meal. 60 capsule 3 09/29/2016 at Unknown  time  . promethazine (PHENERGAN) 25 MG tablet Take 25 mg by mouth daily.  1 09/29/2016 at Unknown time  . ranitidine (ZANTAC) 150 MG tablet Take 150 mg by mouth at bedtime.  12 09/29/2016 at Unknown time  . sucralfate (CARAFATE) 1 g tablet Take 1 g by mouth every morning.  3 09/29/2016 at Unknown time    Review of Systems  Constitutional: Positive for chills. Negative for fever.  Respiratory: Negative for shortness of breath.   Cardiovascular: Positive for palpitations.  Gastrointestinal: Positive for nausea. Negative for abdominal pain, constipation, diarrhea and vomiting.  Genitourinary: Positive for frequency, pelvic pain and vaginal bleeding. Negative for dysuria.  Neurological: Positive for light-headedness. Negative for syncope.   Physical Exam   Blood pressure 132/81, pulse (!) 59, temperature 97.7 F (36.5 C), resp. rate 16, height 5\' 5"  (1.651 m), weight 67.6 kg (149 lb), last menstrual period 09/15/2016.  Physical Exam  Constitutional: She is oriented to person, place, and time. She appears well-developed and well-nourished. No distress.  Cardiovascular: Normal rate.   Respiratory: Effort normal.  GI: Soft. Bowel sounds are normal. She exhibits no distension. There is tenderness in the suprapubic area.  Genitourinary: Cervix exhibits no motion tenderness. Right adnexum displays no tenderness. Left adnexum displays no tenderness.  Genitourinary Comments: Small amount of blood seen in vaginal vault, bleeding evident from the cervical os  Musculoskeletal: Normal range of motion.  Neurological: She is alert and oriented to person, place, and time.  Skin: Skin is warm and dry.   Results for orders placed or performed during the hospital encounter of 09/29/16 (from the past 24 hour(s))  Urinalysis, Routine w reflex microscopic     Status: Abnormal   Collection Time: 09/29/16  9:09 AM  Result Value Ref Range   Color, Urine STRAW (A) YELLOW   APPearance CLEAR CLEAR   Specific Gravity,  Urine 1.003 (L) 1.005 - 1.030   pH 7.0 5.0 - 8.0   Glucose, UA NEGATIVE NEGATIVE mg/dL   Hgb urine dipstick LARGE (A) NEGATIVE   Bilirubin Urine NEGATIVE NEGATIVE   Ketones, ur NEGATIVE NEGATIVE mg/dL   Protein, ur NEGATIVE NEGATIVE mg/dL   Nitrite NEGATIVE NEGATIVE   Leukocytes, UA NEGATIVE NEGATIVE   RBC / HPF 0-5 0 - 5 RBC/hpf   WBC, UA NONE SEEN 0 - 5 WBC/hpf   Bacteria, UA RARE (A) NONE SEEN   Squamous Epithelial / LPF 0-5 (A) NONE SEEN  Pregnancy, urine POC     Status: None   Collection Time: 09/29/16  9:15 AM  Result Value Ref Range   Preg Test, Ur NEGATIVE NEGATIVE  CBC     Status: Abnormal   Collection Time: 09/29/16  9:46 AM  Result Value Ref Range   WBC 3.6 (L) 4.0 - 10.5 K/uL   RBC 4.84 3.87 - 5.11 MIL/uL   Hemoglobin 13.4 12.0 - 15.0 g/dL   HCT 40.2 36.0 - 46.0 %   MCV 83.1 78.0 - 100.0 fL   MCH 27.7 26.0 - 34.0 pg   MCHC 33.3 30.0 - 36.0 g/dL   RDW 15.3 11.5 - 15.5 %   Platelets 146 (L) 150 - 400 K/uL    MAU Course  Procedures  MDM CBC UA/Urine Preg  Assessment and Plan   1. Abnormal Uterine Bleeding 2. Uterine Fibroids  Discharge Home in stable condition Rx- Megace 40mg  BID Outpatient follow-up for Abnormal Uterine Bleeding in [facility] GYN clinic Return to MAU if bleeding become more severe, syncope or other emegerncies  Jacqlyn Larsen 09/29/2016, 9:54 AM

## 2016-09-29 NOTE — MAU Note (Addendum)
Pt states she had her period but then has continued to bleed for a month. Heavy like a period and some clots. Also feeling light headed

## 2016-10-10 ENCOUNTER — Encounter: Payer: Medicaid Other | Admitting: Psychology

## 2016-10-18 ENCOUNTER — Encounter: Payer: Self-pay | Admitting: Psychology

## 2016-10-18 ENCOUNTER — Ambulatory Visit (INDEPENDENT_AMBULATORY_CARE_PROVIDER_SITE_OTHER): Payer: Medicaid Other | Admitting: Psychology

## 2016-10-18 DIAGNOSIS — R413 Other amnesia: Secondary | ICD-10-CM

## 2016-10-18 DIAGNOSIS — F411 Generalized anxiety disorder: Secondary | ICD-10-CM

## 2016-10-18 NOTE — Progress Notes (Signed)
NEUROPSYCHOLOGICAL INTERVIEW (CPT: [id number])  Name: Katherine [name] Date of Birth: 07-02-1971 Date of Interview: 10/18/2016  Reason for Referral:  Katherine [name] is a 46 y.o. female who is referred for neuropsychological evaluation by Ward Givens, NP and Dr. Jannifer Franklin of [facility] due to concerns about memory loss. This patient is accompanied in the office by her husband who supplements the history.  History of Presenting Problem:  Katherine [name] reported onset of memory difficulties approximately two years ago. Her husband believes it has been longer than that, perhaps as many as four years ago. They reported gradual onset with progressive worsening over time. The patient and her husband reported significant forgetfulness on a daily basis. She noted that when she tries to remember something, it makes her head hurt. Her husband reported that he tries to keep all stress from her because stress makes it much worse. She agrees she gets a very high level of anxiety when she is stressed and it makes it hard to think.   Upon direct questioning, the patient and her husband reported:   Forgetting recent conversations/events: Yes Repeating statements/questions: Yes Misplacing/losing items: Yes Forgetting appointments or other obligations: Occasional Forgetting to take medications: Occasional, but has pretty good routine. Husband oversees.  Difficulty concentrating: Yes Starting but not finishing tasks: Sometimes Distracted easily: Sometimes Processing information more slowly: Yes  Word-finding difficulty: Yes Comprehension difficulty: No per patient, at times per husband  Getting lost when driving: No, relies on GPS Making wrong turns when driving: No Uncertain about directions when driving or passenger: Yes  Her husband also notes that when she goes to the grocery to get several items, she will get some of them but forget the most important item or items.   She has no known history  of head injury.  Psychiatric history is reportedly significant for anxiety. She was treated at [name] for this in the past. She reported the medication (unsure what one) was helpful. However, she was unable to keep up with her appointments. She is no longer on any medication for anxiety. Upon direct questioning, she also endorsed history of visual hallucinations on one or two occasions but these did not persist or recur and this was several years ago. She denied past or present suicidal ideation or intention.  Family history is significant for possible dementia in her mother when she was in her 23s.   Current Functioning: Katherine [name] reported she is on disability and unable to work due to a heart condition. She has not worked in approximately 6 years. She lives with her husband in a private residence. She continues to drive. Her husband has to help her remember to take her medications; he oversees this to make sure she is not taking too much. Her husband also has to help her with the finances now. She continues to manage her appointments and do cooking.  When asked how she is doing physically, she responds "fine" but upon direct questioning she endorses pain "all the time" due to fibromyalgia. She also reported "severe migraines".  She reported some occasional difficulty with balance. She has not had any falls in about 5 years.  She described her current mood as "so-so". She denied depressed mood but endorsed lack of interest. She reported that anxiety is a problem. She denied significant sleep difficulty. Her appetites is good.   Her husband denies change in personality or behavior.    Social History: Born/Raised: [place] Education: Quit high school in the 12th grade.  Says she got a GED from [employer] but that they lost record of this so she has to do it all over again. 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 She is on disability. She noted that she wants to get her GED (again) and go to college to study criminal justice. She wants the current evaluation done to help figure out what is "going on with her memory" so that she can go back to school. Marital history: Married to her current husband since 28. 2 children. 1 grandchild. Alcohol/Tobacco/Substances: No alcohol. Current everyday smoker, about 2 cigarettes/day. If she gets stressed, she smokes much more. She also reported occasional marijuana use for fibromyalgia. She denied any recreational drug use. She denied history of substance abuse or dependence.   Medical History: Past Medical History:  Diagnosis Date  . Anxiety   . Asthma   . Fibromyalgia   . Hot flashes   . Migraine   . Palpitations   . Pneumonia    with cavitation of left lower lobe  . Uterine fibroid      Current Medications:  Outpatient Encounter Prescriptions as of 10/18/2016  Medication Sig  . albuterol (PROVENTIL HFA;VENTOLIN HFA) 108 (90 Base) MCG/ACT inhaler Inhale 2 puffs into the lungs every 6 (six) hours as needed for wheezing or shortness of breath.  . DEXILANT 60 MG capsule Take 60 mg by mouth daily.  Marland Kitchen diltiazem (CARDIZEM CD) 240 MG 24 hr capsule Take 240 mg by mouth daily.   Marland Kitchen labetalol (NORMODYNE) 100 MG tablet Take 1 tablet (100 mg total) by mouth 2 (two) times daily.  . megestrol (MEGACE) 40 MG tablet Take 1 tablet (40 mg total) by mouth 2 (two) times daily. Can increase to two tablets twice a day in the event of heavy bleeding  . meloxicam (MOBIC) 15 MG tablet Take 15 mg by mouth daily.  . metoCLOPramide (REGLAN) 10 MG tablet Take 10 mg by mouth 4 (four) times daily.  Marland Kitchen omeprazole (PRILOSEC) 20 MG capsule Take 1 capsule (20 mg total) by mouth 2 (two) times daily before a meal.  . promethazine (PHENERGAN) 25 MG tablet Take 25 mg by mouth daily.  . ranitidine (ZANTAC) 150 MG tablet Take 150 mg by  mouth at bedtime.  . sucralfate (CARAFATE) 1 g tablet Take 1 g by mouth every morning.   No facility-administered encounter medications on file as of 10/18/2016.      Behavioral Observations:   Appearance: Casually and appropriately dressed and groomed Gait: Ambulated independently, no abnormalities observed Speech: Fluent; mildly dysarthric/mumbling quality; grammar errors Thought process: Linear, goal directed Affect: Full, appears euthymic Interpersonal: Pleasant, appropriate   TESTING: There is medical necessity to proceed with neuropsychological assessment as the results will be used to aid in differential diagnosis and clinical decision-making and to inform specific treatment recommendations. Per the patient, her husband and medical records reviewed, there has been a change in cognitive functioning and a reasonable suspicion of a neurocognitive disorder.   PLAN: The patient will return for a full battery of neuropsychological testing with a psychometrician under my supervision. Education regarding testing procedures was provided. Subsequently, the patient will see this provider for a follow-up session at which time her test performances and my impressions and treatment recommendations will be reviewed in detail.   Full neuropsychological evaluation report  to follow.

## 2016-10-19 ENCOUNTER — Ambulatory Visit (INDEPENDENT_AMBULATORY_CARE_PROVIDER_SITE_OTHER): Payer: Medicaid Other | Admitting: Obstetrics & Gynecology

## 2016-10-19 ENCOUNTER — Other Ambulatory Visit (HOSPITAL_COMMUNITY)
Admission: RE | Admit: 2016-10-19 | Discharge: 2016-10-19 | Disposition: A | Discharge status: Home / Self Care | Payer: Medicaid Other | Source: Ambulatory Visit | Attending: Obstetrics & Gynecology | Admitting: Obstetrics & Gynecology

## 2016-10-19 ENCOUNTER — Encounter: Payer: Self-pay | Admitting: Obstetrics & Gynecology

## 2016-10-19 VITALS — BP 127/90 | HR 67 | Ht 65.0 in | Wt 156.0 lb

## 2016-10-19 DIAGNOSIS — D250 Submucous leiomyoma of uterus: Secondary | ICD-10-CM

## 2016-10-19 DIAGNOSIS — D252 Subserosal leiomyoma of uterus: Principal | ICD-10-CM

## 2016-10-19 DIAGNOSIS — D251 Intramural leiomyoma of uterus: Secondary | ICD-10-CM | POA: Insufficient documentation

## 2016-10-19 LAB — POCT PREGNANCY, URINE: Preg Test, Ur: NEGATIVE

## 2016-10-19 MED ORDER — MEGESTROL ACETATE 40 MG PO TABS: 40.0000 mg | ORAL_TABLET | Freq: Two times a day (BID) | ORAL | 1 refills | Status: DC

## 2016-10-19 NOTE — Patient Instructions (Signed)
Endometrial Biopsy, Care After Refer to this sheet in the next few weeks. These instructions provide you with information on caring for yourself after your procedure. Your health care provider may also give you more specific instructions. Your treatment has been planned according to current medical practices, but problems sometimes occur. Call your health care provider if you have any problems or questions after your procedure. WHAT TO EXPECT AFTER THE PROCEDURE After your procedure, it is typical to have the following:  You may have mild cramping and a small amount of vaginal bleeding for a few days after the procedure. This is normal. HOME CARE INSTRUCTIONS  Only take over-the-counter or prescription medicine as directed by your health care provider.  Do not douche, use tampons, or have sexual intercourse until your health care provider approves.  Follow your health care provider's instructions regarding any activity restrictions, such as strenuous exercise or heavy lifting. SEEK MEDICAL CARE IF:  You have heavy bleeding or bleeding longer than 2 days after the procedure.  You have bad smelling drainage from your vagina.  You have a fever and chills.  Youhave severe lower stomach (abdominal) pain. SEEK IMMEDIATE MEDICAL CARE IF:  You have severe cramps in your stomach or back.  You pass large blood clots.  Your bleeding increases.  You become weak or lightheaded, or you pass out. This information is not intended to replace advice given to you by your health care provider. Make sure you discuss any questions you have with your health care provider. Document Released: 07/02/2013 Document Reviewed: 07/02/2013 Elsevier Interactive Patient Education  2017 [employer]. Hysterectomy Information A hysterectomy is a surgery in which your uterus is removed. This surgery may be done to treat various medical problems. After the surgery, you will no longer have menstrual periods. The  surgery will also make you unable to become pregnant (sterile). The fallopian tubes and ovaries can be removed (bilateral salpingo-oophorectomy) during this surgery as well. Reasons for a hysterectomy  Persistent, abnormal bleeding.  Lasting (chronic) pelvic pain or infection.  The lining of the uterus (endometrium) starts growing outside the uterus (endometriosis).  The endometrium starts growing in the muscle of the uterus (adenomyosis).  The uterus falls down into the vagina (pelvic organ prolapse).  Noncancerous growths in the uterus (uterine fibroids) that cause symptoms.  Precancerous cells.  Cervical cancer or uterine cancer. Types of hysterectomies  Supracervical hysterectomy-In this type, the top part of the uterus is removed, but not the cervix.  Total hysterectomy-The uterus and cervix are removed.  Radical hysterectomy-The uterus, the cervix, and the fibrous tissue that holds the uterus in place in the pelvis (parametrium) are removed. Ways a hysterectomy can be performed  Abdominal hysterectomy-A large surgical cut (incision) is made in the abdomen. The uterus is removed through this incision.  Vaginal hysterectomy-An incision is made in the vagina. The uterus is removed through this incision. There are no abdominal incisions.  Conventional laparoscopic hysterectomy-Three or four small incisions are made in the abdomen. A thin, lighted tube with a camera (laparoscope) is inserted into one of the incisions. Other tools are put through the other incisions. The uterus is cut into small pieces. The small pieces are removed through the incisions, or they are removed through the vagina.  Laparoscopically assisted vaginal hysterectomy (LAVH)-Three or four small incisions are made in the abdomen. Part of the surgery is performed laparoscopically and part vaginally. The uterus is removed through the vagina.  Robot-assisted laparoscopic hysterectomy-A laparoscope and other tools  are inserted into 3 or 4 small incisions in the abdomen. A computer-controlled device is used to give the surgeon a 3D image and to help control the surgical instruments. This allows for more precise movements of surgical instruments. The uterus is cut into small pieces and removed through the incisions or removed through the vagina. What are the risks? Possible complications associated with this procedure include:  Bleeding and risk of blood transfusion. Tell your health care provider if you do not want to receive any blood products.  Blood clots in the legs or lung.  Infection.  Injury to surrounding organs.  Problems or side effects related to anesthesia.  Conversion to an abdominal hysterectomy from one of the other techniques. What to expect after a hysterectomy  You will be given pain medicine.  You will need to have someone with you for the first 3-5 days after you go home.  You will need to follow up with your surgeon in 2-4 weeks after surgery to evaluate your progress.  You may have early menopause symptoms such as hot flashes, night sweats, and insomnia.  If you had a hysterectomy for a problem that was not cancer or not a condition that could lead to cancer, then you no longer need Pap tests. However, even if you no longer need a Pap test, a regular exam is a good idea to make sure no other problems are starting. This information is not intended to replace advice given to you by your health care provider. Make sure you discuss any questions you have with your health care provider. Document Released: 03/07/2001 Document Revised: 02/17/2016 Document Reviewed: 05/19/2013 Elsevier Interactive Patient Education  2017 [employer].

## 2016-10-19 NOTE — Progress Notes (Signed)
History:  46 y.o. [id number] here today for eval of AUB. S/p SVD. She was seen in the [facility]U earlier this month and started on Megace 40mg  bid.  Pt stared bleeding in Nov and bled until the megace was started in early Jan. Pt is s/p BTL.  Pt reports that she wants definitive treatment for bleeding and fibroids.   Pt is followed by Dr. Harrington Challenger at the [facility] heart care.    The following portions of the patient's history were reviewed and updated as appropriate: allergies, current medications, past family history, past medical history, past social history, past surgical history and problem list.  Review of Systems:  Pertinent items are noted in HPI.   Objective:  Physical Exam Blood pressure 127/90, pulse 67, height 5\' 5"  (1.651 m), weight 156 lb (70.8 kg), last menstrual period 09/16/2016. Gen: NAD Abd: Soft, nontender and nondistended Pelvic: Normal appearing external genitalia; normal appearing vaginal mucosa and cervix.  Normal discharge.  8 weeks sized uterus with fibroid, no other palpable masses, no uterine or adnexal tenderness  The indications for endometrial biopsy were reviewed.   Risks of the biopsy including cramping, bleeding, infection, uterine perforation, inadequate specimen and need for additional procedures  were discussed. The patient states she understands and agrees to undergo procedure today. Consent was signed. Time out was performed. Urine HCG was negative. A sterile speculum was placed in the patient's vagina and the cervix was prepped with Betadine. A single-toothed tenaculum was placed on the anterior lip of the cervix to stabilize it. The 3 mm pipelle was introduced into the endometrial cavity without difficulty to a depth of 8cm, and a moderate amount of tissue was obtained and sent to pathology. The instruments were removed from the patient's vagina. Minimal bleeding from the cervix was noted. The patient tolerated the procedure well.   Labs and Imaging    08/08/2016 CLINICAL DATA:  Follow-up hemorrhagic ovarian cyst on the right  EXAM: TRANSABDOMINAL AND TRANSVAGINAL ULTRASOUND OF PELVIS  TECHNIQUE: Both transabdominal and transvaginal ultrasound examinations of the pelvis were performed. Transabdominal technique was performed for global imaging of the pelvis including uterus, ovaries, adnexal regions, and pelvic cul-de-sac. It was necessary to proceed with endovaginal exam following the transabdominal exam to visualize the uterus, endometrium, ovaries and adnexa .  COMPARISON:  11/06/2014  FINDINGS: Uterus  Measurements: 8.5 x 9.1 x 9.2 cm. Left fundal fibroid measures up to 7.2 cm. Small anterior fundal fibroid measures up to 2.4 cm.  Endometrium  Thickness: Normal thickness, 7 mm.  No focal abnormality visualized.  Right ovary  Measurements: 2.3 x 1.6 x 2.7 cm. Normal appearance/no adnexal mass.  Left ovary  Measurements: 1.9 x 2.3 x 2.8 cm. Normal appearance/no adnexal mass.  Other findings  No abnormal free fluid.  IMPRESSION: Uterine fibroids.  No ovarian/adnexal masses.  No acute findings.   Assessment & Plan:  AUB and pain thought due to fibroid uterus.  Sx improved somewhat on Megace S/p endo bx Routine post-procedure instructions were given to the patient. The patient will follow up to review the results and for further management.    F/u surg path  Patient desires surgical management with TVH with bilateral salpingectomy.  The risks of surgery were discussed in detail with the patient including but not limited to: bleeding which may require transfusion or reoperation; infection which may require prolonged hospitalization or re-hospitalization and antibiotic therapy; injury to bowel, bladder, ureters and major vessels or other surrounding organs; need for additional procedures including laparotomy;  thromboembolic phenomenon, incisional problems and other postoperative or anesthesia  complications.  Patient was told that the likelihood that her condition and symptoms will be treated effectively with this surgical management was very high; the postoperative expectations were also discussed in detail. The patient also understands the alternative treatment options which were discussed in full. All questions were answered.  She was told that she will be contacted by our surgical scheduler regarding the time and date of her surgery; routine preoperative instructions of having nothing to eat or drink after midnight on the day prior to surgery and also coming to the hospital 1 1/2 hours prior to her time of surgery were also emphasized.  She was told she may be called for a preoperative appointment about a week prior to surgery and will be given further preoperative instructions at that visit. Printed patient education handouts about the procedure were given to the patient to review at home.  PATIENT MUST HAVE CARDIOLOGY CLEARANCE PRIOR TO SURGERY. She has an appt scheduled for 10/30/2016 with Dr. Harrington Challenger  Will send Dr. Harrington Challenger a note today EPIC   Chioma Mukherjee L. Harraway-Smith, M.D., Cherlynn June

## 2016-10-23 ENCOUNTER — Encounter (HOSPITAL_COMMUNITY): Payer: Self-pay

## 2016-10-30 ENCOUNTER — Ambulatory Visit (INDEPENDENT_AMBULATORY_CARE_PROVIDER_SITE_OTHER): Payer: Medicaid Other | Admitting: Internal Medicine

## 2016-10-30 ENCOUNTER — Encounter (INDEPENDENT_AMBULATORY_CARE_PROVIDER_SITE_OTHER): Payer: Self-pay

## 2016-10-30 ENCOUNTER — Encounter: Payer: Self-pay | Admitting: Internal Medicine

## 2016-10-30 VITALS — BP 118/78 | HR 84 | Ht 65.0 in | Wt 151.8 lb

## 2016-10-30 DIAGNOSIS — I471 Supraventricular tachycardia: Secondary | ICD-10-CM | POA: Diagnosis not present

## 2016-10-30 DIAGNOSIS — R079 Chest pain, unspecified: Secondary | ICD-10-CM | POA: Diagnosis not present

## 2016-10-30 NOTE — Patient Instructions (Signed)
Your physician recommends that you continue on your current medications as directed. Please refer to the Current Medication list given to you today.  Your physician wants you to follow-up in: October, 2018 with Dr. Harrington Challenger.  You will receive a reminder letter in the mail two months in advance. If you don't receive a letter, please call our office to schedule the follow-up appointment.

## 2016-10-30 NOTE — Progress Notes (Signed)
Cardiology Office Note   Date:  10/30/2016   ID:  Katherine [name], DOB Feb 15, 1971, MRN [id number]  PCP:  Minerva Ends, MD  Cardiologist:   Dorris Carnes, MD   F/U of SVT and CP      History of Present Illness: Katherine [name] is a 46 y.o. female with a history of SVT  Seen by EP  Also a history of CP    Sharp pain come/go  2 to 3 days   No cough   Went to ED  Spells Last 5 min then go away  Laid down  Still has it comes and goes  Only once in a   Blue moon Tired   Sleeping a lot  No prblems coming in from parking lot  No SOB   Lays around house    WHen HR speeds up lays down       Current Meds  Medication Sig  . albuterol (PROVENTIL HFA;VENTOLIN HFA) 108 (90 Base) MCG/ACT inhaler Inhale 2 puffs into the lungs every 6 (six) hours as needed for wheezing or shortness of breath.  . DEXILANT 60 MG capsule Take 60 mg by mouth daily.  Marland Kitchen diltiazem (CARDIZEM CD) 240 MG 24 hr capsule Take 240 mg by mouth daily.   Marland Kitchen labetalol (NORMODYNE) 100 MG tablet Take 1 tablet (100 mg total) by mouth 2 (two) times daily.  . megestrol (MEGACE) 40 MG tablet Take 1 tablet (40 mg total) by mouth 2 (two) times daily. Can increase to two tablets twice a day in the event of heavy bleeding  . meloxicam (MOBIC) 15 MG tablet Take 15 mg by mouth daily.  . metoCLOPramide (REGLAN) 10 MG tablet Take 10 mg by mouth 4 (four) times daily.  Marland Kitchen omeprazole (PRILOSEC) 20 MG capsule Take 1 capsule (20 mg total) by mouth 2 (two) times daily before a meal.  . promethazine (PHENERGAN) 25 MG tablet Take 25 mg by mouth daily.  . ranitidine (ZANTAC) 150 MG tablet Take 150 mg by mouth at bedtime.  . sucralfate (CARAFATE) 1 g tablet Take 1 g by mouth every morning.  . SUMAtriptan (IMITREX) 50 MG tablet Take 50 mg by mouth as directed. May repeat in 2 hours if headache persists or recurs.     Allergies:   Morphine; Shellfish allergy; Penicillins; Codeine; Hydrocodone; and Tomato   Past Medical History:  Diagnosis Date  .  Anxiety   . Asthma   . Fibromyalgia   . Hot flashes   . Migraine   . Palpitations   . Pneumonia    with cavitation of left lower lobe  . Uterine fibroid     Past Surgical History:  Procedure Laterality Date  . TUBAL LIGATION  1993     Social History:  The patient  reports that she has been smoking Cigarettes.  She has been smoking about 0.05 packs per day. She has never used smokeless tobacco. She reports that she uses drugs, including Marijuana. She reports that she does not drink alcohol.   Family History:  The patient's family history includes Breast cancer (age of onset: 66) in her sister; Heart disease in her father and mother; Hypertension in her father and mother; Migraines in her daughter and son.    ROS:  Please see the history of present illness. All other systems are reviewed and  Negative to the above problem except as noted.    PHYSICAL EXAM: VS:  BP 118/78   Pulse 84   Ht  5\' 5"  (1.651 m)   Wt 151 lb 12.8 oz (68.9 kg)   SpO2 99%   BMI 25.26 kg/m   GEN: Well nourished, well developed, in no acute distress  HEENT: normal  Neck: no JVD, carotid bruits, or masses Cardiac: RRR; no murmurs, rubs, or gallops,no edema  Respiratory:  clear to auscultation bilaterally, normal work of breathing GI: soft, nontender, nondistended, + BS  No hepatomegaly  MS: no deformity Moving all extremities   Skin: warm and dry, no rash Neuro:  Strength and sensation are intact Psych: euthymic mood, full affect   EKG:  EKG is not  ordered today.   Lipid Panel    Component Value Date/Time   CHOL 145 09/05/2013 1459   TRIG 76 09/05/2013 1459   HDL 46 09/05/2013 1459   CHOLHDL 3.2 09/05/2013 1459   VLDL 15 09/05/2013 1459   LDLCALC 84 09/05/2013 1459      Wt Readings from Last 3 Encounters:  10/30/16 151 lb 12.8 oz (68.9 kg)  10/19/16 156 lb (70.8 kg)  09/29/16 149 lb (67.6 kg)      ASSESSMENT AND PLAN: 1  CP  Atypical  I do not think cardiac    2  SVT  I am not  convinced of recurrence  Pt wth signif vaginal bleeding and fibroid  Plan fro surgery  From cardiac standpoint she is low risk (normal stress echo Feb 2017)  OK to proceed    F/U in October 2018    Current medicines are reviewed at length with the patient today.  The patient does not have concerns regarding medicines.  Signed, Dorris Carnes, MD  10/30/2016 9:19 AM    [address], [place], [place]  [address] Phone: [phone]; Fax: [phone]

## 2016-10-31 ENCOUNTER — Telehealth: Payer: Self-pay | Admitting: *Deleted

## 2016-10-31 NOTE — Telephone Encounter (Signed)
Pt called to see when her next appointment is.

## 2016-10-31 NOTE — Telephone Encounter (Signed)
Called pt and left message that I am returning her call with the information requested. With her permission we can leave a detailed message. Please call back and state whether this is acceptable. Per chart review, pt does not have a follow up appt scheduled in this office at present. Per notes from Dr. Ihor Dow on 1/25, pt requires cardac clearance prior to surgery scheduling.

## 2016-11-02 ENCOUNTER — Ambulatory Visit (INDEPENDENT_AMBULATORY_CARE_PROVIDER_SITE_OTHER): Payer: Medicaid Other | Admitting: Psychology

## 2016-11-02 DIAGNOSIS — R413 Other amnesia: Secondary | ICD-10-CM | POA: Diagnosis not present

## 2016-11-02 NOTE — Telephone Encounter (Signed)
Called patient and she states she is still having bleeding like a period that is constant. Patient reports taking two pills of megace in the morning and two in the evening. Patient would also like to know when her next appt is because she is supposed to have surgery. Told patient I will send a message to Dr Ihor Dow to see if there is something we can do medication wise for her bleeding. Told patient of surgery date scheduled on 3/27 and that she will be contacted closer to surgery with a pre op appt as well. Patient verbalized understanding to all & will await phone call about medication increase. Message sent to Dr Ihor Dow.

## 2016-11-02 NOTE — Progress Notes (Signed)
   Neuropsychology Note  Katherine [name] returned today for 2 hours of neuropsychological testing with technician, Katherine [name], BS, under the supervision of Dr. Macarthur Critchley. The patient did not appear overtly distressed by the testing session, per behavioral observation or via self-report to the technician. Rest breaks were offered. Katherine [name] will return within 2 weeks for a feedback session with Dr. Si Raider at which time her test performances, clinical impressions and treatment recommendations will be reviewed in detail. The patient understands she can contact our office should she require our assistance before this time.  Full report to follow.

## 2016-11-03 NOTE — Telephone Encounter (Addendum)
Dr. Ihor Dow responded to message from [place], [place] stating that pt may increase dose of Megace to 80 mg BID. I sent message back to her stating that pt has already increased to this dosage per previous instructions and is still bleeding heavily.   1240  Called and left message for pt after response received from Dr. Ihor Dow. I stated that she is taking the maximum dose of Megace and there are no other medication options st this time. She may come in on Monday or Tuesday to check CBC. Dr. Ihor Dow will check to see if an earlier surgery date can be scheduled, however no guarantee.  Pt will be contacted if the date is changed.

## 2016-11-06 ENCOUNTER — Other Ambulatory Visit: Payer: Medicaid Other

## 2016-11-06 DIAGNOSIS — N938 Other specified abnormal uterine and vaginal bleeding: Secondary | ICD-10-CM

## 2016-11-06 LAB — CBC
HCT: 37.5 % (ref 35.0–45.0)
HEMOGLOBIN: 12.2 g/dL (ref 11.7–15.5)
MCH: 27.4 pg (ref 27.0–33.0)
MCHC: 32.5 g/dL (ref 32.0–36.0)
MCV: 84.3 fL (ref 80.0–100.0)
MPV: 9.5 fL (ref 7.5–12.5)
Platelets: 255 10*3/uL (ref 140–400)
RBC: 4.45 MIL/uL (ref 3.80–5.10)
RDW: 15.4 % — ABNORMAL HIGH (ref 11.0–15.0)
WBC: 10.6 10*3/uL (ref 3.8–10.8)

## 2016-11-15 ENCOUNTER — Encounter (HOSPITAL_COMMUNITY): Payer: Self-pay | Admitting: *Deleted

## 2016-11-15 ENCOUNTER — Inpatient Hospital Stay (HOSPITAL_COMMUNITY)
Admission: AD | Admit: 2016-11-15 | Discharge: 2016-11-15 | Disposition: A | Discharge status: Home / Self Care | Payer: Medicaid Other | Source: Ambulatory Visit | Attending: Obstetrics & Gynecology | Admitting: Obstetrics & Gynecology

## 2016-11-15 DIAGNOSIS — N939 Abnormal uterine and vaginal bleeding, unspecified: Secondary | ICD-10-CM

## 2016-11-15 DIAGNOSIS — D251 Intramural leiomyoma of uterus: Secondary | ICD-10-CM | POA: Insufficient documentation

## 2016-11-15 DIAGNOSIS — D250 Submucous leiomyoma of uterus: Secondary | ICD-10-CM | POA: Diagnosis not present

## 2016-11-15 DIAGNOSIS — D252 Subserosal leiomyoma of uterus: Secondary | ICD-10-CM | POA: Diagnosis not present

## 2016-11-15 LAB — POCT PREGNANCY, URINE: PREG TEST UR: NEGATIVE

## 2016-11-15 LAB — CBC
HEMATOCRIT: 37.3 % (ref 36.0–46.0)
Hemoglobin: 12.5 g/dL (ref 12.0–15.0)
MCH: 27.2 pg (ref 26.0–34.0)
MCHC: 33.5 g/dL (ref 30.0–36.0)
MCV: 81.1 fL (ref 78.0–100.0)
Platelets: 255 10*3/uL (ref 150–400)
RBC: 4.6 MIL/uL (ref 3.87–5.11)
RDW: 16.3 % — AB (ref 11.5–15.5)
WBC: 10.3 10*3/uL (ref 4.0–10.5)

## 2016-11-15 LAB — URINALYSIS, ROUTINE W REFLEX MICROSCOPIC
BILIRUBIN URINE: NEGATIVE
Glucose, UA: NEGATIVE mg/dL
Ketones, ur: NEGATIVE mg/dL
LEUKOCYTES UA: NEGATIVE
NITRITE: NEGATIVE
PH: 7 (ref 5.0–8.0)
Protein, ur: NEGATIVE mg/dL
SPECIFIC GRAVITY, URINE: 1.016 (ref 1.005–1.030)

## 2016-11-15 MED ORDER — KETOROLAC TROMETHAMINE 60 MG/2ML IM SOLN
60.0000 mg | Freq: Once | INTRAMUSCULAR | Status: AC
  Administered 2016-11-15: 60 mg via INTRAMUSCULAR
  Filled 2016-11-15: qty 2

## 2016-11-15 MED ORDER — MEGESTROL ACETATE 40 MG PO TABS: 40.0000 mg | ORAL_TABLET | Freq: Two times a day (BID) | ORAL | 2 refills | Status: DC

## 2016-11-15 NOTE — Discharge Instructions (Signed)
Menorrhagia Menorrhagia is when your menstrual periods are heavy or last longer than usual. Follow these instructions at home:  Only take medicine as told by your doctor.  Take any iron pills as told by your doctor. Heavy bleeding may cause low levels of iron in your body.  Do not take aspirin 1 week before or during your period. Aspirin can make the bleeding worse.  Lie down for a while if you change your tampon or pad more than once in 2 hours. This may help lessen the bleeding.  Eat a healthy diet and foods with iron. These foods include leafy green vegetables, meat, liver, eggs, and whole grain breads and cereals.  Do not try to lose weight. Wait until the heavy bleeding has stopped and your iron level is normal. Contact a doctor if:  You soak through a pad or tampon every 1 or 2 hours, and this happens every time you have a period.  You need to use pads and tampons at the same time because you are bleeding so much.  You need to change your pad or tampon during the night.  You have a period that lasts for more than 8 days.  You pass clots bigger than 1 inch (2.5 cm) wide.  You have irregular periods that happen more or less often than once a month.  You feel dizzy or pass out (faint).  You feel very weak or tired.  You feel short of breath or feel your heart is beating too fast when you exercise.  You feel sick to your stomach (nausea) and you throw up (vomit) while you are taking your medicine.  You have watery poop (diarrhea) while you are taking your medicine.  You have any problems that may be related to the medicine you are taking. Get help right away if:  You soak through 4 or more pads or tampons in 2 hours.  You have any bleeding while you are pregnant. This information is not intended to replace advice given to you by your health care provider. Make sure you discuss any questions you have with your health care provider. Document Released: 06/20/2008 Document  Revised: 02/17/2016 Document Reviewed: 03/13/2013 Elsevier Interactive Patient Education  2017 [employer]  

## 2016-11-15 NOTE — MAU Note (Signed)
Bleeding started 2 or 3 days ago. not when she expected a period as she is on medication so that she won't bleed. Bleeding is heavy, has already changed 4 times this morning.   Having sharp pains in lower abd.

## 2016-11-15 NOTE — ED Provider Notes (Deleted)
History     CSN: [id number]  Arrival date and time: 11/15/16 [id number]   First Provider Initiated Contact with Patient 11/15/16 0830      Chief Complaint  Patient presents with  . Vaginal Bleeding  . Abdominal Pain   Katherine [name] is a 46 y.o. Black female who presents to MAU today with c/o dizziness, weakness, pelvic pain, and heavy vaginal bleeding. She has a chronic history of AUB r/t fibroids. She is a clinic patient and currently being managed on Megace with a scheduled TVH with Salpingo on 12/19/16 pending cardiac clearance. Her initial bleeding began back in November 2017. She reports her last normal cycle was in September 2017. She stated she has been having constant bleeding despite taking the Megace. The bleeding has been heavier over the past 24 hrs and she has been having some lower pelvic pain, right lower back pain. She reports having to change her pad out every 1-2 hours on yesterday with some breakthrough bleeding to clothes. She has been taking Megace BID as ordered with no relief. She denies taking any medications for pain relief. Denies any urinary symptoms, fever, chills, nausea, vomting, constipation, diarrhea, or headaches. She does state the cramping and pain is somewhat relieved when she uses a warm compress to abdomen, hot bath soaks, or drinks hot tea. The bleeding is constant with some days lighter than others with no aggravating factors.    The history is provided by the patient.    OB History    Gravida Para Term Preterm AB Living   2 2 1 1   2    SAB TAB Ectopic Multiple Live Births                  Past Medical History:  Diagnosis Date  . Anxiety   . Asthma   . Fibromyalgia   . Hot flashes   . Migraine   . Palpitations   . Pneumonia    with cavitation of left lower lobe  . Uterine fibroid     Past Surgical History:  Procedure Laterality Date  . TUBAL LIGATION  1993    Family History  Problem Relation Age of Onset  . Migraines Daughter   .  Hypertension Mother   . Heart disease Mother   . Hypertension Father   . Heart disease Father   . Breast cancer Sister 34    deceased at age 76  . Migraines Son   . Hypertension    . Colon cancer Neg Hx   . Colon polyps Neg Hx   . Diabetes Neg Hx   . Kidney disease Neg Hx   . Liver disease Neg Hx   . Heart attack Neg Hx   . Stroke Neg Hx     Social History  Substance Use Topics  . Smoking status: Current Some Day Smoker    Packs/day: 0.05    Types: Cigarettes  . Smokeless tobacco: Never Used     Comment: smokes 2 cigarettes per day and more if she is stressed  . Alcohol use No    Allergies:  Allergies  Allergen Reactions  . Morphine Anaphylaxis, Hives and Swelling  . Shellfish Allergy Anaphylaxis  . Penicillins Other (See Comments)    Yeast infection Has patient had a PCN reaction causing immediate rash, facial/tongue/throat swelling, SOB or lightheadedness with hypotension: no Has patient had a PCN reaction causing severe rash involving mucus membranes or skin necrosis: no Has patient had a PCN reaction that required hospitalization  no Has patient had a PCN reaction occurring within the last 10 years:no If all of the above answers are "NO", then may proceed with Cephalosporin use.   . Codeine Hives  . Hydrocodone Hives  . Tomato Rash    Prescriptions Prior to Admission  Medication Sig Dispense Refill Last Dose  . albuterol (PROVENTIL HFA;VENTOLIN HFA) 108 (90 Base) MCG/ACT inhaler Inhale 2 puffs into the lungs every 6 (six) hours as needed for wheezing or shortness of breath.   Taking  . DEXILANT 60 MG capsule Take 60 mg by mouth daily.  3 Taking  . diltiazem (CARDIZEM CD) 240 MG 24 hr capsule Take 240 mg by mouth daily.   5 Taking  . labetalol (NORMODYNE) 100 MG tablet Take 1 tablet (100 mg total) by mouth 2 (two) times daily. 60 tablet 6 Taking  . megestrol (MEGACE) 40 MG tablet Take 1 tablet (40 mg total) by mouth 2 (two) times daily. Can increase to two tablets  twice a day in the event of heavy bleeding 60 tablet 1 Taking  . meloxicam (MOBIC) 15 MG tablet Take 15 mg by mouth daily.  3 Taking  . metoCLOPramide (REGLAN) 10 MG tablet Take 10 mg by mouth 4 (four) times daily.  3 Taking  . omeprazole (PRILOSEC) 20 MG capsule Take 1 capsule (20 mg total) by mouth 2 (two) times daily before a meal. 60 capsule 3 Taking  . promethazine (PHENERGAN) 25 MG tablet Take 25 mg by mouth daily.  1 Taking  . ranitidine (ZANTAC) 150 MG tablet Take 150 mg by mouth at bedtime.  12 Taking  . sucralfate (CARAFATE) 1 g tablet Take 1 g by mouth every morning.  3 Taking  . SUMAtriptan (IMITREX) 50 MG tablet Take 50 mg by mouth as directed. May repeat in 2 hours if headache persists or recurs.   Taking    Review of Systems  Constitutional: Negative.   HENT: Negative.   Eyes: Negative.   Respiratory: Negative.   Cardiovascular: Negative.   Endocrine: Negative.   Genitourinary: Positive for frequency and pelvic pain (mid and lower right ).  Musculoskeletal: Positive for back pain (lower right side ).  Skin: Negative.   Allergic/Immunologic: Negative.   Neurological: Positive for dizziness and light-headedness (over the past 24 hours ).  Hematological: Negative.   Psychiatric/Behavioral: Negative.    Physical Exam   Blood pressure 117/74, pulse 77, temperature 98.9 F (37.2 C), temperature source Oral, resp. rate 18, last menstrual period 11/13/2016.  Physical Exam  Constitutional: She is oriented to person, place, and time. She appears well-developed and well-nourished.  HENT:  Head: Normocephalic and atraumatic.  Mouth/Throat: Oropharynx is clear and moist.  Eyes: Conjunctivae and EOM are normal.  Neck: Normal range of motion. Neck supple.  Cardiovascular: Normal rate, regular rhythm, normal heart sounds and intact distal pulses.   Respiratory: Effort normal and breath sounds normal.  GI: Soft. Bowel sounds are normal. There is tenderness (umbilical and lower  right on palpation ).  Musculoskeletal: Normal range of motion.  Neurological: She is alert and oriented to person, place, and time. She has normal reflexes.  Skin: Skin is warm and dry.  Psychiatric: She has a normal mood and affect. Her behavior is normal. Judgment and thought content normal.    MAU Course  Procedures  1) CBC-Reviewed and hgb is stable (12.5 today compared to 12.2 on 11/06/16).  2) UA- Reviewed/Negative UTI (hgb present) 3) Urine Preg -Reviewed/Negative 4) IM Toradol -Administered by RN  5) Pelvic exam-small amount of bleeding noted, cervix round and pink, visually closed without lesions, no CMT, vaginal walls and genitalia normal.  bimanual exam performed, no mass or enlargement, cervix is anterior and firm, uterus nontender, adnexa tender on right.   Assessment and Plan  Abnormal Uterine Bleeding   Patient will be discharged home. She has been reassured that the bleeding was minimal on exam and hgb is stable per lab findings. Recommend to continue with Megace and iron as previously advised. Bleeding precautions discussed. Advised to keep upcoming appointments and preparation for her scheduled TVH on 12/19/16. Informed to return to MAU or other ED if symptoms worsens or she develops a fever.   Braydee Shimkus 11/15/2016, 8:54 AM

## 2016-11-15 NOTE — MAU Note (Addendum)
Pt is a clinic patient, has fibroids, is on megace for bleeding. She states she has a hysterectomy planned.

## 2016-11-15 NOTE — MAU Provider Note (Signed)
History     CSN: [id number]  Arrival date and time: 11/15/16 [id number]   First Provider Initiated Contact with Patient 11/15/16 0830      Chief Complaint  Patient presents with  . Vaginal Bleeding  . Abdominal Pain   HPI   [name] is a 46 y.o. female G2P1102 here in MAU with abnormal vaginal bleeding.  She has a history of abnormal uterine bleeding and uterine leiomyoma. She is scheduled to have a hysterectomy next month. She feels the bleeding is just as heavy as it was prior to starting the megace. She is requesting that her surgery be moved up to a closer date. She is currently taking megace 80 mg twice daily.   OB History    Gravida Para Term Preterm AB Living   2 2 1 1   2    SAB TAB Ectopic Multiple Live Births                  Past Medical History:  Diagnosis Date  . Anxiety   . Asthma   . Fibromyalgia   . Hot flashes   . Migraine   . Palpitations   . Pneumonia    with cavitation of left lower lobe  . Uterine fibroid     Past Surgical History:  Procedure Laterality Date  . TUBAL LIGATION  1993    Family History  Problem Relation Age of Onset  . Migraines Daughter   . Hypertension Mother   . Heart disease Mother   . Hypertension Father   . Heart disease Father   . Breast cancer Sister 87    deceased at age 26  . Migraines Son   . Hypertension    . Colon cancer Neg Hx   . Colon polyps Neg Hx   . Diabetes Neg Hx   . Kidney disease Neg Hx   . Liver disease Neg Hx   . Heart attack Neg Hx   . Stroke Neg Hx     Social History  Substance Use Topics  . Smoking status: Current Some Day Smoker    Packs/day: 0.05    Types: Cigarettes  . Smokeless tobacco: Never Used     Comment: smokes 2 cigarettes per day and more if she is stressed  . Alcohol use No    Allergies:  Allergies  Allergen Reactions  . Morphine Anaphylaxis, Hives and Swelling  . Shellfish Allergy Anaphylaxis  . Penicillins Other (See Comments)    Yeast infection Has patient had  a PCN reaction causing immediate rash, facial/tongue/throat swelling, SOB or lightheadedness with hypotension: no Has patient had a PCN reaction causing severe rash involving mucus membranes or skin necrosis: no Has patient had a PCN reaction that required hospitalization no Has patient had a PCN reaction occurring within the last 10 years:no If all of the above answers are "NO", then may proceed with Cephalosporin use.   . Codeine Hives  . Hydrocodone Hives  . Tomato Rash    Prescriptions Prior to Admission  Medication Sig Dispense Refill Last Dose  . albuterol (PROVENTIL HFA;VENTOLIN HFA) 108 (90 Base) MCG/ACT inhaler Inhale 2 puffs into the lungs every 6 (six) hours as needed for wheezing or shortness of breath.   Taking  . DEXILANT 60 MG capsule Take 60 mg by mouth daily.  3 Taking  . diltiazem (CARDIZEM CD) 240 MG 24 hr capsule Take 240 mg by mouth daily.   5 Taking  . labetalol (NORMODYNE) 100 MG tablet Take  1 tablet (100 mg total) by mouth 2 (two) times daily. 60 tablet 6 Taking  . megestrol (MEGACE) 40 MG tablet Take 1 tablet (40 mg total) by mouth 2 (two) times daily. Can increase to two tablets twice a day in the event of heavy bleeding 60 tablet 1 Taking  . meloxicam (MOBIC) 15 MG tablet Take 15 mg by mouth daily.  3 Taking  . metoCLOPramide (REGLAN) 10 MG tablet Take 10 mg by mouth 4 (four) times daily.  3 Taking  . omeprazole (PRILOSEC) 20 MG capsule Take 1 capsule (20 mg total) by mouth 2 (two) times daily before a meal. 60 capsule 3 Taking  . promethazine (PHENERGAN) 25 MG tablet Take 25 mg by mouth daily.  1 Taking  . ranitidine (ZANTAC) 150 MG tablet Take 150 mg by mouth at bedtime.  12 Taking  . sucralfate (CARAFATE) 1 g tablet Take 1 g by mouth every morning.  3 Taking  . SUMAtriptan (IMITREX) 50 MG tablet Take 50 mg by mouth as directed. May repeat in 2 hours if headache persists or recurs.   Taking   Results for orders placed or performed during the hospital encounter  of 11/15/16 (from the past 48 hour(s))  Urinalysis, Routine w reflex microscopic     Status: Abnormal   Collection Time: 11/15/16  7:50 AM  Result Value Ref Range   Color, Urine YELLOW YELLOW   APPearance CLEAR CLEAR   Specific Gravity, Urine 1.016 1.005 - 1.030   pH 7.0 5.0 - 8.0   Glucose, UA NEGATIVE NEGATIVE mg/dL   Hgb urine dipstick LARGE (A) NEGATIVE   Bilirubin Urine NEGATIVE NEGATIVE   Ketones, ur NEGATIVE NEGATIVE mg/dL   Protein, ur NEGATIVE NEGATIVE mg/dL   Nitrite NEGATIVE NEGATIVE   Leukocytes, UA NEGATIVE NEGATIVE   RBC / HPF 0-5 0 - 5 RBC/hpf   WBC, UA 0-5 0 - 5 WBC/hpf   Bacteria, UA RARE (A) NONE SEEN   Squamous Epithelial / LPF 0-5 (A) NONE SEEN   Mucous PRESENT   Pregnancy, urine POC     Status: None   Collection Time: 11/15/16  8:45 AM  Result Value Ref Range   Preg Test, Ur NEGATIVE NEGATIVE    Comment:        THE SENSITIVITY OF THIS METHODOLOGY IS >24 mIU/mL   CBC     Status: Abnormal   Collection Time: 11/15/16  8:57 AM  Result Value Ref Range   WBC 10.3 4.0 - 10.5 K/uL   RBC 4.60 3.87 - 5.11 MIL/uL   Hemoglobin 12.5 12.0 - 15.0 g/dL   HCT 37.3 36.0 - 46.0 %   MCV 81.1 78.0 - 100.0 fL   MCH 27.2 26.0 - 34.0 pg   MCHC 33.5 30.0 - 36.0 g/dL   RDW 16.3 (H) 11.5 - 15.5 %   Platelets 255 150 - 400 K/uL   Review of Systems  Constitutional: Positive for fatigue.  Genitourinary: Positive for vaginal bleeding.  Neurological: Positive for weakness.   Physical Exam   Blood pressure 117/74, pulse 77, temperature 98.9 F (37.2 C), temperature source Oral, resp. rate 18, last menstrual period 11/13/2016.  Physical Exam  Constitutional: She is oriented to person, place, and time. She appears well-developed and well-nourished. No distress.  HENT:  Head: Normocephalic.  Eyes: Pupils are equal, round, and reactive to light.  Genitourinary:  Genitourinary Comments: Vagina - Small amount of dark red blood in the vagina.  Cervix - No active bleeding  Bimanual exam: Cervix closed Uterus non tender, enlarged  Adnexa non tender, no masses bilaterally Chaperone present for exam.   Musculoskeletal: Normal range of motion.  Neurological: She is alert and oriented to person, place, and time.  Skin: Skin is warm. She is not diaphoretic.  Psychiatric: Her behavior is normal.    MAU Course  Procedures  None  MDM  CBC Bleeding minimal and stable at this time.  Toradol 60 mg IM given. Pain down from an 8/10- 5/10  Assessment and Plan   A:  1. Abnormal uterine bleeding (AUB)   2. Intramural, submucous, and subserous leiomyoma of uterus     P:  Discharge home in stable condition Return to MAU as needed, if symptoms worsen. For emergencies Bleeding precautions Continue Megace Ok to use ibuprofen as directed on the bottle.    Lezlie Lye, NP 11/15/2016 2:16 PM

## 2016-12-05 ENCOUNTER — Encounter (HOSPITAL_COMMUNITY): Payer: Self-pay

## 2016-12-05 NOTE — Progress Notes (Deleted)
NEUROPSYCHOLOGICAL EVALUATION   Name:    Katherine [name]  Date of Birth:   Apr 26, 1971 Date of Interview:  10/18/2016 Date of Testing:  11/02/2016   Date of Feedback:  12/07/2016       Background Information:  Reason for Referral:  Katherine [name] is a 46 y.o. female referred by Ward Givens, NP and Dr. Jannifer Franklin of [facility] to assess her current level of cognitive functioning and assist in differential diagnosis. The current evaluation consisted of a review of available medical records, an interview with the patient and her husband, and the completion of a neuropsychological testing battery. Informed consent was obtained.  History of Presenting Problem:  Katherine [name] reported onset of memory difficulties approximately two years ago. Her husband believes it has been longer than that, perhaps as many as four years ago. They reported gradual onset with progressive worsening over time. The patient and her husband reported significant forgetfulness on a daily basis. She noted that when she tries to remember something, it makes her head hurt. Her husband reported that he tries to keep all stress from her because stress makes it much worse. She agrees she gets a very high level of anxiety when she is stressed and it makes it hard to think.   Upon direct questioning, the patient and her husband reported:   Forgetting recent conversations/events: Yes Repeating statements/questions: Yes Misplacing/losing items: Yes Forgetting appointments or other obligations: Occasional Forgetting to take medications: Occasional, but has pretty good routine. Husband oversees.  Difficulty concentrating: Yes Starting but not finishing tasks: Sometimes Distracted easily: Sometimes Processing information more slowly: Yes  Word-finding difficulty: Yes Comprehension difficulty: No per patient, at times per husband  Getting lost when driving: No, relies on GPS Making wrong turns when driving:  No Uncertain about directions when driving or passenger: Yes  Her husband also notes that when she goes to the grocery to get several items, she will get some of them but forget the most important item or items.   She has no known history of head injury.  Psychiatric history is reportedly significant for anxiety. She was treated at [facility] for this in the past. She reported the medication (unsure what one) was helpful. However, she was unable to keep up with her appointments. She is no longer on any medication for anxiety. Upon direct questioning, she also endorsed history of visual hallucinations on one or two occasions but these did not persist or recur and this was several years ago. She denied past or present suicidal ideation or intention.  Family history is significant for possible dementia in her mother when she was in her 19s.   Current Functioning: Katherine [name] reported she is on disability and unable to work due to a heart condition. She has not worked in approximately 6 years. She lives with her husband in a private residence. She continues to drive. Her husband has to help her remember to take her medications; he oversees this to make sure she is not taking too much. Her husband also has to help her with the finances now. She continues to manage her appointments and do cooking.  When asked how she is doing physically, she responds "fine" but upon direct questioning she endorses pain "all the time" due to fibromyalgia. She also reported "severe migraines".  She reported some occasional difficulty with balance. She has not had any falls in about 5 years.  She described her current mood as "so-so". She denied depressed mood  but endorsed lack of interest. She reported that anxiety is a problem. She denied significant sleep difficulty. Her appetites is good.   Her husband denies change in personality or behavior.    Social History: Born/Raised: [place] Education: Quit high school  in the 12th grade. Says she got a GED from [employer] but that they lost record of this so she has to do it all over again. 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 She noted that she wants to get her GED (again) and go to college to study criminal justice. She wants the current evaluation done to help figure out what is "going on with her memory" so that she can go back to school. Marital history: Married to her current husband since 73. 2 children. 1 grandchild. Alcohol/Tobacco/Substances: No alcohol. Current everyday smoker, about 2 cigarettes/day. If she gets stressed, she smokes much more. She also reported occasional marijuana use for fibromyalgia. She denied any recreational drug use. She denied history of substance abuse or dependence.  Medical History:  Past Medical History:  Diagnosis Date  . Anxiety   . Asthma   . Fibromyalgia   . Hot flashes   . Migraine   . Palpitations   . Pneumonia    with cavitation of left lower lobe  . Uterine fibroid     Current medications:  Outpatient Encounter Prescriptions as of 12/07/2016  Medication Sig  . albuterol (PROVENTIL HFA;VENTOLIN HFA) 108 (90 Base) MCG/ACT inhaler Inhale 2 puffs into the lungs every 6 (six) hours as needed for wheezing or shortness of breath.  . diltiazem (CARDIZEM CD) 240 MG 24 hr capsule Take 240 mg by mouth daily.   Marland Kitchen labetalol (NORMODYNE) 100 MG tablet Take 1 tablet (100 mg total) by mouth 2 (two) times daily. (Patient taking differently: Take 100 mg by mouth daily. )  . megestrol (MEGACE) 40 MG tablet Take 1 tablet (40 mg total) by mouth 2 (two) times daily. Can increase to two tablets twice a day in the event of heavy bleeding (Patient taking differently: Take 80 mg by mouth 2 (two) times daily. Can increase to two tablets twice a day in the event  of heavy bleeding)  . meloxicam (MOBIC) 15 MG tablet Take 15 mg by mouth daily.  . metoCLOPramide (REGLAN) 10 MG tablet Take 10 mg by mouth daily.   . Multiple Vitamins-Minerals (HAIR SKIN AND NAILS FORMULA PO) Take 1 tablet by mouth daily.  Marland Kitchen omeprazole (PRILOSEC) 20 MG capsule Take 1 capsule (20 mg total) by mouth 2 (two) times daily before a meal.  . promethazine (PHENERGAN) 25 MG tablet Take 25 mg by mouth daily.  . sucralfate (CARAFATE) 1 g tablet Take 1 g by mouth daily.   . SUMAtriptan (IMITREX) 50 MG tablet Take 50 mg by mouth every 2 (two) hours as needed for migraine. May repeat in 2 hours if headache persists or recurs.    No facility-administered encounter medications on file as of 12/07/2016.     Current Examination:  Behavioral Observations:   Appearance: Casually and appropriately dressed and groomed Gait: Ambulated independently, no abnormalities observed Speech: Fluent; mildly dysarthric/mumbling quality; grammar errors Thought process: Linear, goal directed Affect: Full, appears euthymic Interpersonal: Pleasant, appropriate Orientation: Oriented to person, city, and most aspects of time (two days off on day  of the week). Incorrectly reported current office as [facility]. Accurately named current Software engineer and his predecessor.  Tests Administered: Marland Kitchen Green's WMT . Test of Premorbid Functioning (TOPF) . Wechsler Adult Intelligence Scale-Fourth Edition (WAIS-IV): Similarities, Information, Block Design, Matrix Reasoning, Arithmetic, Symbol Search, Coding and Digit Span subtests . Wechsler Memory Scale-Fourth Edition (WMS-IV) Adult Version (ages 27-69): Logical Memory I, II and Recognition subtests  . [employer] Verbal Learning Test - 2nd Edition (CVLT-2) Short Form . [employer] (WCST) . Repeatable Battery for the Assessment of Neuropsychological Status (RBANS) Form A: Figure Copy and Figure Recall subtests . Neuropsychological Assessment Battery  (NAB) Language Module, Form 1:  Naming subtest . Controlled Oral Word Association Test (COWAT) . Trail Making Test A and B . Beck Depression Inventory - Second edition (BDI-II) . Beck Anxiety Inventory (BAI) . Generalized Anxiety Disorder (GAD-7) 7 item screener  Test Results: Standardized scores are presented only for use by appropriately trained professionals and to allow for any future test-retest comparison. These scores should not be interpreted without consideration of all the information that is contained in the rest of the report. The most recent standardization samples from the test publisher or other sources were used whenever possible to derive standard scores; scores were corrected for age, gender, ethnicity and education when available.   Note: The following test performances may not provide a completely valid estimate of Mrs. Vogl current neuropsychological functioning as there was evidence of variable effort to engage in the cognitive tests. True abilities are thought to be of at least the level reported here and deficits cannot be assumed to be genuine.  Test Scores:  ***  Description of Test Results:  Embedded performance validity indicators revealed suboptimal levels of effort and attention. As such, the patient's current performance on neurocognitive testing may not accurately reflect her true cognitive abilities, and results of neuropsychological testing cannot be validly interpreted. Nonetheless, it should be noted that the patient performed within normal limits on tests of visual-spatial construction, auditory attention, and semantic fluency, which at least argues against deficits in these domains of cognitive function.  On self-report questionnaires, the patient's responses were not indicative of clinically significant depression or anxiety at the present time. She denied any symptoms of depression which is atypical even for the normal population, suggesting that she may  be minimizing symptoms.   Clinical Impressions: Diagnosis deferred.     Recommendations/Plan: Based on the findings of the present evaluation, the following recommendations are offered:  1.    Feedback to Patient: KASSONDRA GEIL returned for a feedback appointment on 12/05/2016 to review the results of her neuropsychological evaluation with this provider. *** minutes face-to-face time was spent reviewing her test results, my impressions and my recommendations as detailed above.    Total time spent on this patient's case: 90791x1 unit for interview with psychologist; [phone] units of testing by psychometrician under psychologist's supervision; [id number]*** units for medical record review, scoring of neuropsychological tests, interpretation of test results, preparation of this report, and review of results to the patient by psychologist.      Thank you for your referral of LYNA LANINGHAM. Please feel free to contact me if you have any questions or concerns regarding this report.

## 2016-12-07 ENCOUNTER — Encounter (HOSPITAL_COMMUNITY): Payer: Self-pay

## 2016-12-07 ENCOUNTER — Encounter: Payer: Medicaid Other | Admitting: Psychology

## 2016-12-07 ENCOUNTER — Encounter (HOSPITAL_COMMUNITY)
Admission: RE | Admit: 2016-12-07 | Discharge: 2016-12-07 | Disposition: A | Discharge status: Home / Self Care | Payer: Medicaid Other | Source: Ambulatory Visit | Attending: Obstetrics & Gynecology | Admitting: Obstetrics & Gynecology

## 2016-12-07 DIAGNOSIS — Z01812 Encounter for preprocedural laboratory examination: Secondary | ICD-10-CM | POA: Diagnosis present

## 2016-12-07 HISTORY — DX: Respiratory tuberculosis unspecified: A15.9

## 2016-12-07 HISTORY — DX: Cardiac arrhythmia, unspecified: I49.9

## 2016-12-07 HISTORY — DX: Gastro-esophageal reflux disease without esophagitis: K21.9

## 2016-12-07 LAB — TYPE AND SCREEN
ABO/RH(D): O POS
Antibody Screen: NEGATIVE

## 2016-12-07 LAB — CBC
HEMATOCRIT: 38.2 % (ref 36.0–46.0)
HEMOGLOBIN: 12.7 g/dL (ref 12.0–15.0)
MCH: 28.2 pg (ref 26.0–34.0)
MCHC: 33.2 g/dL (ref 30.0–36.0)
MCV: 84.9 fL (ref 78.0–100.0)
Platelets: 235 10*3/uL (ref 150–400)
RBC: 4.5 MIL/uL (ref 3.87–5.11)
RDW: 16.5 % — ABNORMAL HIGH (ref 11.5–15.5)
WBC: 10.5 10*3/uL (ref 4.0–10.5)

## 2016-12-07 LAB — COMPREHENSIVE METABOLIC PANEL
ALBUMIN: 4.1 g/dL (ref 3.5–5.0)
ALK PHOS: 67 U/L (ref 38–126)
ALT: 17 U/L (ref 14–54)
AST: 20 U/L (ref 15–41)
Anion gap: 7 (ref 5–15)
BUN: 13 mg/dL (ref 6–20)
CALCIUM: 9.3 mg/dL (ref 8.9–10.3)
CO2: 25 mmol/L (ref 22–32)
Chloride: 104 mmol/L (ref 101–111)
Creatinine, Ser: 0.72 mg/dL (ref 0.44–1.00)
GFR calc Af Amer: >60 mL/min (ref >60)
GFR calc non Af Amer: >60 mL/min (ref >60)
Glucose, Bld: 95 mg/dL (ref 65–99)
Potassium: 3.9 mmol/L (ref 3.5–5.1)
SODIUM: 136 mmol/L (ref 135–145)
Total Bilirubin: 0.6 mg/dL (ref 0.3–1.2)
Total Protein: 7.8 g/dL (ref 6.5–8.1)

## 2016-12-07 LAB — ABO/RH: ABO/RH(D): O POS

## 2016-12-07 NOTE — Progress Notes (Deleted)
NEUROPSYCHOLOGICAL EVALUATION     Name:                                      Katherine [name]      Date of Birth:                           [birth year] Date of Interview:                    10/18/2016 Date of Testing:                      11/02/2016                       Date of Feedback:                  12/11/2016                                     Background Information:   Reason for Referral:  Katherine [name] is a 46 y.o. female referred by Ward Givens, NP and Dr. Jannifer Franklin of [facility] to assess her current level of cognitive functioning and assist in differential diagnosis. The current evaluation consisted of a review of available medical records, an interview with the patient and her husband, and the completion of a neuropsychological testing battery. Informed consent was obtained.   History of Presenting Problem:  Katherine [name] reported onset of memory difficulties approximately two years ago. Her husband believes it has been longer than that, perhaps as many as four years ago. They reported gradual onset with progressive worsening over time. The patient and her husband reported significant forgetfulness on a daily basis. She noted that when she tries to remember something, it makes her head hurt. Her husband reported that he tries to keep all stress from her because stress makes it much worse. She agrees she gets a very high level of anxiety when she is stressed and it makes it hard to think.    Upon direct questioning, the patient and her husband reported:    Forgetting recent conversations/events: Yes Repeating statements/questions: Yes Misplacing/losing items: Yes Forgetting appointments or other obligations: Occasional Forgetting to take medications: Occasional, but has pretty good routine. Husband oversees.   Difficulty concentrating: Yes Starting but not finishing tasks: Sometimes Distracted easily: Sometimes Processing information more slowly: Yes     Word-finding difficulty: Yes Comprehension difficulty: No per patient, at times per husband   Getting lost when driving: No, relies on GPS Making wrong turns when driving: No Uncertain about directions when driving or passenger: Yes   Her husband also notes that when she goes to the grocery to get several items, she will get some of them but forget the most important item or items.    She has no known history of head injury.   Psychiatric history is reportedly significant for anxiety. She was treated at [facility] for this in the past. She reported the medication (unsure what one) was helpful. However, she was unable to keep up with her appointments. She is no longer on any medication for anxiety. Upon direct questioning, she also endorsed history of visual hallucinations on one or two occasions but these did  not persist or recur and this was several years ago. She denied past or present suicidal ideation or intention.   Family history is significant for possible dementia in her mother when she was in her 83s.     Current Functioning: Katherine [name] reported she is on disability and unable to work due to a heart condition. She has not worked in approximately 6 years. She lives with her husband in a private residence. She continues to drive. Her husband has to help her remember to take her medications; he oversees this to make sure she is not taking too much. Her husband also has to help her with the finances now. She continues to manage her appointments and do cooking.   When asked how she is doing physically, she responds "fine" but upon direct questioning she endorses pain "all the time" due to fibromyalgia. She also reported "severe migraines".   She reported some occasional difficulty with balance. She has not had any falls in about 5 years.   She described her current mood as "so-so". She denied depressed mood but endorsed lack of interest. She reported that anxiety is a problem. She denied  significant sleep difficulty. Her appetites is good.    Her husband denies change in personality or behavior.      Social History: Born/Raised: [place] Education: Quit high school in the 12th grade. Says she got a GED from [employer] but that they lost record of this so she has to do it all over again. 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 She noted that she wants to get her GED (again) and go to college to study criminal justice. She wants the current evaluation done to help figure out what is "going on with her memory" so that she can go back to school. Marital history: Married to her current husband since 26. 2 children. 1 grandchild. Alcohol/Tobacco/Substances: No alcohol. Current everyday smoker, about 2 cigarettes/day. If she gets stressed, she smokes much more. She also reported occasional marijuana use for fibromyalgia. She denied any recreational drug use. She denied history of substance abuse or dependence.   Medical History:  Past Medical History:  Diagnosis Date  . Anxiety   . Asthma   . Dysrhythmia   . Fibromyalgia   . GERD (gastroesophageal reflux disease)   . Hot flashes   . Migraine   . Palpitations   . Pneumonia    with cavitation of left lower lobe  . Tuberculosis    exposure as a child, get checked frequently  . Uterine fibroid     Current medications:  Outpatient Encounter Prescriptions as of 12/11/2016  Medication Sig  . albuterol (PROVENTIL HFA;VENTOLIN HFA) 108 (90 Base) MCG/ACT inhaler Inhale 2 puffs into the lungs every 6 (six) hours as needed for wheezing or shortness of breath.  . diltiazem (CARDIZEM CD) 240 MG 24 hr capsule Take 240 mg by mouth daily.   Marland Kitchen labetalol (NORMODYNE) 100 MG tablet Take 1 tablet (100 mg total) by mouth 2 (two) times daily. (Patient taking differently: Take  100 mg by mouth daily. )  . megestrol (MEGACE) 40 MG tablet Take 1 tablet (40 mg total) by mouth 2 (two) times daily. Can increase to two tablets twice a day in the event of heavy bleeding (Patient taking differently: Take 80  mg by mouth 2 (two) times daily. Can increase to two tablets twice a day in the event of heavy bleeding)  . meloxicam (MOBIC) 15 MG tablet Take 15 mg by mouth daily.  . metoCLOPramide (REGLAN) 10 MG tablet Take 10 mg by mouth daily.   . Multiple Vitamins-Minerals (HAIR SKIN AND NAILS FORMULA PO) Take 1 tablet by mouth daily.  Marland Kitchen omeprazole (PRILOSEC) 20 MG capsule Take 1 capsule (20 mg total) by mouth 2 (two) times daily before a meal.  . promethazine (PHENERGAN) 25 MG tablet Take 25 mg by mouth daily.  . sucralfate (CARAFATE) 1 g tablet Take 1 g by mouth daily.   . SUMAtriptan (IMITREX) 50 MG tablet Take 50 mg by mouth every 2 (two) hours as needed for migraine. May repeat in 2 hours if headache persists or recurs.    No facility-administered encounter medications on file as of 12/11/2016.      Current Examination:   Behavioral Observations:   Appearance: Casually and appropriately dressed and groomed Gait: Ambulated independently, no abnormalities observed Speech: Fluent; mildly dysarthric/mumbling quality; grammar errors Thought process: Linear, goal directed Affect: Full, appears euthymic Interpersonal: Pleasant, appropriate Orientation: Oriented to person, city, and most aspects of time (two days off on day of the week). Incorrectly reported current office as [facility]. Accurately named current Software engineer and his predecessor.   Tests Administered:  Green's WMT  Test of Premorbid Functioning (TOPF)  Wechsler Adult Intelligence Scale-Fourth Edition (WAIS-IV): Similarities, Information, Music therapist, Matrix Reasoning, Arithmetic, Symbol Search, Coding and Digit Span subtests  Wechsler Memory Scale-Fourth Edition (WMS-IV) Adult Version (ages 58-69):  Logical Memory I, II and Recognition subtests   [place] Verbal Learning Test - 2nd Edition Banker) Short Form  [employer] Test (WCST)  Repeatable Battery for the Assessment of Neuropsychological Status (RBANS) Form A: Figure Copy and Figure Recall subtests  Neuropsychological Assessment Battery (NAB) Language Module, Form 1:  Naming subtest  Controlled Oral Word Association Test (COWAT)  Trail Making Test A and B  Beck Depression Inventory - Second edition (BDI-II)  Beck Anxiety Inventory (BAI)  Generalized Anxiety Disorder (GAD-7) 7 item screener   Test Results: Note: Standardized scores are presented only for use by appropriately trained professionals and to allow for any future test-retest comparison. These scores should not be interpreted without consideration of all the information that is contained in the rest of the report. The most recent standardization samples from the test publisher or other sources were used whenever possible to derive standard scores; scores were corrected for age, gender, ethnicity and education when available.   Note: The following test performances may not provide a completely valid estimate of Mrs. Durio current neuropsychological functioning as there was evidence of variable effort to engage in the cognitive tests. True abilities are thought to be of at least the level reported here and deficits cannot be assumed to be genuine.   Test Scores:   ***   Description of Test Results:   Embedded performance validity indicators revealed suboptimal levels of effort and attention. As such, the patient's current performance on neurocognitive testing may not accurately reflect her true cognitive abilities, and results of neuropsychological testing cannot be validly interpreted. Nonetheless, it should be noted that the patient performed within normal limits on tests of visual-spatial construction, auditory attention, and semantic fluency, which at  least argues against deficits in these domains of cognitive function.   On self-report questionnaires, the patient's responses were not indicative of clinically significant depression or  anxiety at the present time. She denied any symptoms of depression which is atypical even for the normal population, suggesting that she may be minimizing symptoms, especially since she endorsed anxiety during the clinical interview.     Clinical Impressions: Diagnosis deferred due to evidence of poor effort on testing. Unfortunately, results of cognitive testing could not be interpreted due to evidence of suboptimal effort and poor performance validity. This does not rule out the presence of cognitive symptoms, but I am unable to determine the nature or extent of any cognitive dysfunction that may exist.   Despite her denial of depressive/anxiety symptoms on self-report measures, the patient endorsed significant anxiety presently as well as history of more concerning psychiatric symptoms (e.g., hallucinations) during her clinical interview. Further psychiatric evaluation is recommended.    Feedback to Patient: ANUM PALECEK returned for a feedback appointment on 12/11/2016 to review the results of her neuropsychological evaluation with this provider. *** minutes face-to-face time was spent reviewing her test results, my impressions and my recommendations as detailed above.    Total time spent on this patient's case: 90791x1 unit for interview with psychologist; [phone] units of testing by psychometrician under psychologist's supervision; [phone] units for medical record review, scoring of neuropsychological tests, interpretation of test results, preparation of this report, and review of results to the patient by psychologist.     Thank you for your referral of MARGARINE GROSSHANS. Please feel free to contact me if you have any questions or concerns regarding this report.

## 2016-12-07 NOTE — Patient Instructions (Addendum)
Your procedure is scheduled [id number] December 19, 2016  Enter through the [employer] [facility] at: [place] up the phone at the desk and dial [phone].  Call this number if you have problems the morning of surgery: [phone].  Remember: Do NOT eat food or drink and fluids after: Midnight on Monday March 26 Do NOT drink clear liquids after: Take these medicines the morning of surgery with a SIP OF WATER: Diltiazem, Labetalol, Reglan, Prilosec  BRING ALBUTEROL INHALER WITH YOU DAY OF SURGERY  STOP VITAMINS AND SUPPLEMENTS TODAY  Do NOT wear jewelry (body piercing), metal hair clips/bobby pins, make-up, or nail polish. Do NOT wear lotions, powders, or perfumes.  You may wear deoderant. Do NOT shave for 48 hours prior to surgery. Do NOT bring valuables to the hospital. Contacts, dentures, or bridgework may not be worn into surgery.  Leave suitcase in car.  After surgery it may be brought to your room.  For patients admitted to the hospital, checkout time is 11:00 AM the day of discharge.

## 2016-12-11 ENCOUNTER — Encounter: Payer: Medicaid Other | Admitting: Psychology

## 2016-12-12 ENCOUNTER — Encounter: Payer: Self-pay | Admitting: Psychology

## 2016-12-19 ENCOUNTER — Encounter: Payer: Self-pay | Admitting: *Deleted

## 2016-12-19 ENCOUNTER — Ambulatory Visit (HOSPITAL_COMMUNITY): Payer: Medicaid Other | Admitting: Certified Registered Nurse Anesthetist

## 2016-12-19 ENCOUNTER — Observation Stay (HOSPITAL_COMMUNITY)
Admission: RE | Admit: 2016-12-19 | Discharge: 2016-12-20 | Disposition: A | Discharge status: Home / Self Care | Payer: Medicaid Other | Source: Ambulatory Visit | Attending: Obstetrics & Gynecology | Admitting: Obstetrics & Gynecology

## 2016-12-19 ENCOUNTER — Encounter (HOSPITAL_COMMUNITY): Payer: Self-pay | Admitting: *Deleted

## 2016-12-19 ENCOUNTER — Encounter (HOSPITAL_COMMUNITY)
Admission: RE | Disposition: A | Discharge status: Home / Self Care | Payer: Self-pay | Source: Ambulatory Visit | Attending: Obstetrics & Gynecology

## 2016-12-19 DIAGNOSIS — N920 Excessive and frequent menstruation with regular cycle: Secondary | ICD-10-CM | POA: Diagnosis not present

## 2016-12-19 DIAGNOSIS — Z880 Allergy status to penicillin: Secondary | ICD-10-CM | POA: Diagnosis not present

## 2016-12-19 DIAGNOSIS — N939 Abnormal uterine and vaginal bleeding, unspecified: Secondary | ICD-10-CM | POA: Diagnosis present

## 2016-12-19 DIAGNOSIS — K219 Gastro-esophageal reflux disease without esophagitis: Secondary | ICD-10-CM | POA: Diagnosis not present

## 2016-12-19 DIAGNOSIS — Z79899 Other long term (current) drug therapy: Secondary | ICD-10-CM | POA: Insufficient documentation

## 2016-12-19 DIAGNOSIS — N72 Inflammatory disease of cervix uteri: Secondary | ICD-10-CM | POA: Diagnosis not present

## 2016-12-19 DIAGNOSIS — Z885 Allergy status to narcotic agent status: Secondary | ICD-10-CM | POA: Insufficient documentation

## 2016-12-19 DIAGNOSIS — Z91013 Allergy to seafood: Secondary | ICD-10-CM | POA: Insufficient documentation

## 2016-12-19 DIAGNOSIS — Z9889 Other specified postprocedural states: Secondary | ICD-10-CM

## 2016-12-19 DIAGNOSIS — D251 Intramural leiomyoma of uterus: Secondary | ICD-10-CM

## 2016-12-19 DIAGNOSIS — F17210 Nicotine dependence, cigarettes, uncomplicated: Secondary | ICD-10-CM | POA: Diagnosis not present

## 2016-12-19 DIAGNOSIS — J45909 Unspecified asthma, uncomplicated: Secondary | ICD-10-CM | POA: Insufficient documentation

## 2016-12-19 DIAGNOSIS — G43909 Migraine, unspecified, not intractable, without status migrainosus: Secondary | ICD-10-CM | POA: Insufficient documentation

## 2016-12-19 DIAGNOSIS — R102 Pelvic and perineal pain unspecified side: Secondary | ICD-10-CM

## 2016-12-19 DIAGNOSIS — N938 Other specified abnormal uterine and vaginal bleeding: Secondary | ICD-10-CM

## 2016-12-19 DIAGNOSIS — Z791 Long term (current) use of non-steroidal anti-inflammatories (NSAID): Secondary | ICD-10-CM | POA: Insufficient documentation

## 2016-12-19 DIAGNOSIS — D259 Leiomyoma of uterus, unspecified: Secondary | ICD-10-CM | POA: Insufficient documentation

## 2016-12-19 HISTORY — PX: VAGINAL HYSTERECTOMY: SHX2639

## 2016-12-19 LAB — PREGNANCY, URINE: PREG TEST UR: NEGATIVE

## 2016-12-19 SURGERY — HYSTERECTOMY, VAGINAL
Anesthesia: General | Site: Vagina | Laterality: Bilateral

## 2016-12-19 MED ORDER — FENTANYL CITRATE (PF) 100 MCG/2ML IJ SOLN
INTRAMUSCULAR | Status: AC
  Filled 2016-12-19: qty 2

## 2016-12-19 MED ORDER — LIDOCAINE HCL (CARDIAC) 20 MG/ML IV SOLN
INTRAVENOUS | Status: DC | PRN
  Administered 2016-12-19: 40 mg via INTRAVENOUS
  Administered 2016-12-19: 100 mg via INTRAVENOUS

## 2016-12-19 MED ORDER — ACETAMINOPHEN 500 MG PO TABS
ORAL_TABLET | ORAL | Status: DC
Start: 2016-12-19 — End: 2016-12-19
  Filled 2016-12-19: qty 2

## 2016-12-19 MED ORDER — SIMETHICONE 80 MG PO CHEW: 80.0000 mg | CHEWABLE_TABLET | Freq: Four times a day (QID) | ORAL | Status: DC | PRN

## 2016-12-19 MED ORDER — LABETALOL HCL 100 MG PO TABS
100.0000 mg | ORAL_TABLET | Freq: Two times a day (BID) | ORAL | Status: DC
  Administered 2016-12-19 – 2016-12-20 (×2): 100 mg via ORAL
  Filled 2016-12-19 (×2): qty 1

## 2016-12-19 MED ORDER — SODIUM CHLORIDE 0.9 % IJ SOLN
INTRAMUSCULAR | Status: AC
  Filled 2016-12-19: qty 50

## 2016-12-19 MED ORDER — ROCURONIUM BROMIDE 100 MG/10ML IV SOLN
INTRAVENOUS | Status: AC
  Filled 2016-12-19: qty 1

## 2016-12-19 MED ORDER — BISACODYL 10 MG RE SUPP: 10.0000 mg | Freq: Every day | RECTAL | Status: DC | PRN

## 2016-12-19 MED ORDER — BUPIVACAINE HCL (PF) 0.5 % IJ SOLN
INTRAMUSCULAR | Status: AC
  Filled 2016-12-19: qty 30

## 2016-12-19 MED ORDER — LIDOCAINE HCL (CARDIAC) 20 MG/ML IV SOLN
INTRAVENOUS | Status: AC
  Filled 2016-12-19: qty 5

## 2016-12-19 MED ORDER — GLYCOPYRROLATE 0.2 MG/ML IJ SOLN
INTRAMUSCULAR | Status: DC | PRN
  Administered 2016-12-19: 0.1 mg via INTRAVENOUS

## 2016-12-19 MED ORDER — FENTANYL CITRATE (PF) 250 MCG/5ML IJ SOLN
INTRAMUSCULAR | Status: AC
  Filled 2016-12-19: qty 5

## 2016-12-19 MED ORDER — PANTOPRAZOLE SODIUM 40 MG PO TBEC
40.0000 mg | DELAYED_RELEASE_TABLET | Freq: Every day | ORAL | Status: DC
  Administered 2016-12-19 – 2016-12-20 (×3): 40 mg via ORAL
  Filled 2016-12-19 (×3): qty 1

## 2016-12-19 MED ORDER — FENTANYL CITRATE (PF) 100 MCG/2ML IJ SOLN
25.0000 ug | INTRAMUSCULAR | Status: DC | PRN
  Administered 2016-12-19: 25 ug via INTRAVENOUS
  Administered 2016-12-19: 50 ug via INTRAVENOUS
  Administered 2016-12-19 (×3): 25 ug via INTRAVENOUS

## 2016-12-19 MED ORDER — KETOROLAC TROMETHAMINE 30 MG/ML IJ SOLN
30.0000 mg | Freq: Four times a day (QID) | INTRAMUSCULAR | Status: DC
  Administered 2016-12-19 – 2016-12-20 (×3): 30 mg via INTRAVENOUS
  Filled 2016-12-19 (×2): qty 1

## 2016-12-19 MED ORDER — KETOROLAC TROMETHAMINE 30 MG/ML IJ SOLN: 30.0000 mg | Freq: Four times a day (QID) | INTRAMUSCULAR | Status: DC

## 2016-12-19 MED ORDER — ESTRADIOL 0.1 MG/GM VA CREA
TOPICAL_CREAM | VAGINAL | Status: AC
  Filled 2016-12-19: qty 42.5

## 2016-12-19 MED ORDER — FENTANYL CITRATE (PF) 100 MCG/2ML IJ SOLN
INTRAMUSCULAR | Status: DC | PRN
  Administered 2016-12-19 (×3): 25 ug via INTRAVENOUS
  Administered 2016-12-19: 100 ug via INTRAVENOUS
  Administered 2016-12-19 (×2): 50 ug via INTRAVENOUS
  Administered 2016-12-19: 25 ug via INTRAVENOUS

## 2016-12-19 MED ORDER — FENTANYL CITRATE (PF) 100 MCG/2ML IJ SOLN
INTRAMUSCULAR | Status: AC
  Administered 2016-12-19: 50 ug via INTRAVENOUS
  Filled 2016-12-19: qty 2

## 2016-12-19 MED ORDER — DEXAMETHASONE SODIUM PHOSPHATE 4 MG/ML IJ SOLN
INTRAMUSCULAR | Status: AC
  Filled 2016-12-19: qty 1

## 2016-12-19 MED ORDER — EPHEDRINE SULFATE 50 MG/ML IJ SOLN
INTRAMUSCULAR | Status: DC | PRN
  Administered 2016-12-19: 5 mg via INTRAVENOUS

## 2016-12-19 MED ORDER — ALBUTEROL SULFATE (2.5 MG/3ML) 0.083% IN NEBU: 3.0000 mL | INHALATION_SOLUTION | Freq: Four times a day (QID) | RESPIRATORY_TRACT | Status: DC | PRN

## 2016-12-19 MED ORDER — MAGNESIUM CITRATE PO SOLN
1.0000 | Freq: Once | ORAL | Status: DC | PRN
  Filled 2016-12-19: qty 296

## 2016-12-19 MED ORDER — DEXAMETHASONE SODIUM PHOSPHATE 10 MG/ML IJ SOLN
INTRAMUSCULAR | Status: DC | PRN
  Administered 2016-12-19 (×2): 4 mg via INTRAVENOUS

## 2016-12-19 MED ORDER — LACTATED RINGERS IV SOLN
INTRAVENOUS | Status: DC
  Administered 2016-12-19 (×4): via INTRAVENOUS

## 2016-12-19 MED ORDER — KETOROLAC TROMETHAMINE 30 MG/ML IJ SOLN: 30.0000 mg | Freq: Once | INTRAMUSCULAR | Status: DC

## 2016-12-19 MED ORDER — PROMETHAZINE HCL 25 MG/ML IJ SOLN: 6.2500 mg | INTRAMUSCULAR | Status: DC | PRN

## 2016-12-19 MED ORDER — DEXTROSE-NACL 5-0.45 % IV SOLN
INTRAVENOUS | Status: AC
  Administered 2016-12-19: 15:00:00 via INTRAVENOUS

## 2016-12-19 MED ORDER — ACETAMINOPHEN 500 MG PO TABS: 1000.0000 mg | ORAL_TABLET | Freq: Once | ORAL | Status: DC

## 2016-12-19 MED ORDER — LACTATED RINGERS IV SOLN: INTRAVENOUS | Status: DC

## 2016-12-19 MED ORDER — OXYCODONE-ACETAMINOPHEN 5-325 MG PO TABS
1.0000 | ORAL_TABLET | ORAL | Status: DC | PRN
  Administered 2016-12-19: 1 via ORAL
  Administered 2016-12-20 (×2): 2 via ORAL
  Filled 2016-12-19 (×2): qty 2
  Filled 2016-12-19: qty 1

## 2016-12-19 MED ORDER — ESTRADIOL 0.1 MG/GM VA CREA
TOPICAL_CREAM | VAGINAL | Status: DC | PRN
  Administered 2016-12-19: 1 via VAGINAL

## 2016-12-19 MED ORDER — POLYETHYLENE GLYCOL 3350 17 G PO PACK: 17.0000 g | PACK | Freq: Every day | ORAL | Status: DC | PRN

## 2016-12-19 MED ORDER — ONDANSETRON HCL 4 MG/2ML IJ SOLN
INTRAMUSCULAR | Status: AC
  Filled 2016-12-19: qty 2

## 2016-12-19 MED ORDER — ONDANSETRON HCL 4 MG/2ML IJ SOLN
INTRAMUSCULAR | Status: DC | PRN
  Administered 2016-12-19: 4 mg via INTRAVENOUS

## 2016-12-19 MED ORDER — ONDANSETRON HCL 4 MG PO TABS: 4.0000 mg | ORAL_TABLET | Freq: Four times a day (QID) | ORAL | Status: DC | PRN

## 2016-12-19 MED ORDER — MENTHOL 3 MG MT LOZG: 1.0000 | LOZENGE | OROMUCOSAL | Status: DC | PRN

## 2016-12-19 MED ORDER — KETOROLAC TROMETHAMINE 30 MG/ML IJ SOLN
INTRAMUSCULAR | Status: DC | PRN
  Administered 2016-12-19: 30 mg via INTRAVENOUS

## 2016-12-19 MED ORDER — MIDAZOLAM HCL 2 MG/2ML IJ SOLN
INTRAMUSCULAR | Status: DC | PRN
  Administered 2016-12-19: 2 mg via INTRAVENOUS

## 2016-12-19 MED ORDER — IBUPROFEN 600 MG PO TABS: 600.0000 mg | ORAL_TABLET | Freq: Four times a day (QID) | ORAL | Status: DC | PRN

## 2016-12-19 MED ORDER — FENTANYL CITRATE (PF) 100 MCG/2ML IJ SOLN
INTRAMUSCULAR | Status: AC
  Administered 2016-12-19: 25 ug via INTRAVENOUS
  Filled 2016-12-19: qty 2

## 2016-12-19 MED ORDER — PROPOFOL 10 MG/ML IV BOLUS
INTRAVENOUS | Status: DC | PRN
  Administered 2016-12-19: 200 mg via INTRAVENOUS

## 2016-12-19 MED ORDER — SCOPOLAMINE 1 MG/3DAYS TD PT72
1.0000 | MEDICATED_PATCH | Freq: Once | TRANSDERMAL | Status: DC
  Administered 2016-12-19: 1.5 mg via TRANSDERMAL

## 2016-12-19 MED ORDER — DILTIAZEM HCL ER COATED BEADS 240 MG PO CP24
240.0000 mg | ORAL_CAPSULE | Freq: Every day | ORAL | Status: DC
  Administered 2016-12-20: 240 mg via ORAL
  Filled 2016-12-19 (×2): qty 1

## 2016-12-19 MED ORDER — KETOROLAC TROMETHAMINE 30 MG/ML IJ SOLN
INTRAMUSCULAR | Status: AC
  Filled 2016-12-19: qty 1

## 2016-12-19 MED ORDER — SCOPOLAMINE 1 MG/3DAYS TD PT72
MEDICATED_PATCH | TRANSDERMAL | Status: AC
  Administered 2016-12-19: 1.5 mg via TRANSDERMAL
  Filled 2016-12-19: qty 1

## 2016-12-19 MED ORDER — DOCUSATE SODIUM 100 MG PO CAPS
100.0000 mg | ORAL_CAPSULE | Freq: Two times a day (BID) | ORAL | Status: DC
  Administered 2016-12-19 – 2016-12-20 (×3): 100 mg via ORAL
  Filled 2016-12-19 (×3): qty 1

## 2016-12-19 MED ORDER — HYDROMORPHONE HCL 1 MG/ML IJ SOLN: 0.2000 mg | INTRAMUSCULAR | Status: DC | PRN

## 2016-12-19 MED ORDER — GENTAMICIN SULFATE 40 MG/ML IJ SOLN
INTRAVENOUS | Status: AC
  Administered 2016-12-19: 114.25 mL via INTRAVENOUS
  Filled 2016-12-19: qty 8.25

## 2016-12-19 MED ORDER — VASOPRESSIN 20 UNIT/ML IV SOLN
INTRAVENOUS | Status: DC | PRN
  Administered 2016-12-19: 30 mL

## 2016-12-19 MED ORDER — VASOPRESSIN 20 UNIT/ML IV SOLN
INTRAVENOUS | Status: AC
  Filled 2016-12-19: qty 1

## 2016-12-19 MED ORDER — PHENYLEPHRINE 40 MCG/ML (10ML) SYRINGE FOR IV PUSH (FOR BLOOD PRESSURE SUPPORT)
PREFILLED_SYRINGE | INTRAVENOUS | Status: AC
  Filled 2016-12-19: qty 10

## 2016-12-19 MED ORDER — MEPERIDINE HCL 25 MG/ML IJ SOLN: 6.2500 mg | INTRAMUSCULAR | Status: DC | PRN

## 2016-12-19 MED ORDER — MIDAZOLAM HCL 2 MG/2ML IJ SOLN
INTRAMUSCULAR | Status: AC
  Filled 2016-12-19: qty 2

## 2016-12-19 MED ORDER — LIDOCAINE HCL 2 % EX GEL
CUTANEOUS | Status: AC
  Filled 2016-12-19: qty 5

## 2016-12-19 MED ORDER — ONDANSETRON HCL 4 MG/2ML IJ SOLN: 4.0000 mg | Freq: Four times a day (QID) | INTRAMUSCULAR | Status: DC | PRN

## 2016-12-19 MED ORDER — PROPOFOL 10 MG/ML IV BOLUS
INTRAVENOUS | Status: AC
  Filled 2016-12-19: qty 20

## 2016-12-19 SURGICAL SUPPLY — 27 items
BLADE SURG 10 STRL SS (BLADE) ×4 IMPLANT
CANISTER SUCT 3000ML PPV (MISCELLANEOUS) ×3 IMPLANT
CLOTH BEACON ORANGE TIMEOUT ST (SAFETY) ×3 IMPLANT
CONT PATH 16OZ SNAP LID 3702 (MISCELLANEOUS) ×2 IMPLANT
DECANTER SPIKE VIAL GLASS SM (MISCELLANEOUS) ×2 IMPLANT
GAUZE PACKING 2X5 YD STRL (GAUZE/BANDAGES/DRESSINGS) ×2 IMPLANT
GLOVE BIO SURGEON STRL SZ7 (GLOVE) ×5 IMPLANT
GLOVE BIOGEL PI IND STRL 6.5 (GLOVE) ×1 IMPLANT
GLOVE BIOGEL PI IND STRL 7.0 (GLOVE) ×2 IMPLANT
GLOVE BIOGEL PI INDICATOR 6.5 (GLOVE) ×2
GLOVE BIOGEL PI INDICATOR 7.0 (GLOVE) ×6
GOWN STRL REUS W/TWL LRG LVL3 (GOWN DISPOSABLE) ×12 IMPLANT
GOWN STRL REUS W/TWL XL LVL3 (GOWN DISPOSABLE) ×3 IMPLANT
NDL SPNL 20GX3.5 QUINCKE YW (NEEDLE) IMPLANT
NEEDLE SPNL 20GX3.5 QUINCKE YW (NEEDLE) ×3 IMPLANT
NS IRRIG 1000ML POUR BTL (IV SOLUTION) ×3 IMPLANT
PACK VAGINAL WOMENS (CUSTOM PROCEDURE TRAY) ×3 IMPLANT
PAD OB MATERNITY 4.3X12.25 (PERSONAL CARE ITEMS) ×3 IMPLANT
SHEARS FOC LG CVD HARMONIC 17C (MISCELLANEOUS) IMPLANT
SPONGE LAP 4X18 X RAY DECT (DISPOSABLE) ×2 IMPLANT
SUT VIC AB 0 CT1 18XCR BRD8 (SUTURE) ×3 IMPLANT
SUT VIC AB 0 CT1 36 (SUTURE) ×12 IMPLANT
SUT VIC AB 0 CT1 8-18 (SUTURE) ×6
SUT VICRYL 0 TIES 12 18 (SUTURE) ×3 IMPLANT
TOWEL OR 17X24 6PK STRL BLUE (TOWEL DISPOSABLE) ×6 IMPLANT
TRAY FOLEY CATH SILVER 14FR (SET/KITS/TRAYS/PACK) ×3 IMPLANT
WATER STERILE IRR 1000ML POUR (IV SOLUTION) ×3 IMPLANT

## 2016-12-19 NOTE — H&P (Signed)
Preoperative History and Physical  Katherine [name] is a 46 y.o. [phone] here for surgical management of AUB and uterine fibroids  Proposed surgery: total vaginal hysterectomy with salpingectomy  Past Medical History:  Diagnosis Date  . Anxiety   . Asthma   . Dysrhythmia   . Fibromyalgia   . GERD (gastroesophageal reflux disease)   . Hot flashes   . Migraine   . Palpitations   . Pneumonia    with cavitation of left lower lobe  . Tuberculosis    exposure as a child, get checked frequently  . Uterine fibroid    Past Surgical History:  Procedure Laterality Date  . TUBAL LIGATION  1993  . WISDOM TOOTH EXTRACTION     OB History    Gravida Para Term Preterm AB Living   2 2 1 1   2    SAB TAB Ectopic Multiple Live Births                 Patient denies any cervical dysplasia or STIs. Prescriptions Prior to Admission  Medication Sig Dispense Refill Last Dose  . albuterol (PROVENTIL HFA;VENTOLIN HFA) 108 (90 Base) MCG/ACT inhaler Inhale 2 puffs into the lungs every 6 (six) hours as needed for wheezing or shortness of breath.   Past Week at Unknown time  . diltiazem (CARDIZEM CD) 240 MG 24 hr capsule Take 240 mg by mouth daily.   5 12/19/2016 at 0530  . labetalol (NORMODYNE) 100 MG tablet Take 1 tablet (100 mg total) by mouth 2 (two) times daily. (Patient taking differently: Take 100 mg by mouth daily. ) 60 tablet 6 12/19/2016 at 0530  . megestrol (MEGACE) 40 MG tablet Take 1 tablet (40 mg total) by mouth 2 (two) times daily. Can increase to two tablets twice a day in the event of heavy bleeding (Patient taking differently: Take 80 mg by mouth 2 (two) times daily. Can increase to two tablets twice a day in the event of heavy bleeding) 60 tablet 2 Past Week at Unknown time  . meloxicam (MOBIC) 15 MG tablet Take 15 mg by mouth daily.  3 Past Week at Unknown time  . metoCLOPramide (REGLAN) 10 MG tablet Take 10 mg by mouth daily.   3 Past Week at Unknown time  . Multiple Vitamins-Minerals (HAIR  SKIN AND NAILS FORMULA PO) Take 1 tablet by mouth daily.   Past Week at Unknown time  . omeprazole (PRILOSEC) 20 MG capsule Take 1 capsule (20 mg total) by mouth 2 (two) times daily before a meal. 60 capsule 3 Past Week at Unknown time  . promethazine (PHENERGAN) 25 MG tablet Take 25 mg by mouth daily.  1 Past Week at Unknown time  . sucralfate (CARAFATE) 1 g tablet Take 1 g by mouth daily.   3 Past Week at Unknown time  . SUMAtriptan (IMITREX) 50 MG tablet Take 50 mg by mouth every 2 (two) hours as needed for migraine. May repeat in 2 hours if headache persists or recurs.    Past Week at Unknown time    Allergies  Allergen Reactions  . Morphine Anaphylaxis, Hives and Swelling  . Shellfish Allergy Anaphylaxis  . Penicillins Other (See Comments)    Yeast infection Has patient had a PCN reaction causing immediate rash, facial/tongue/throat swelling, SOB or lightheadedness with hypotension: no Has patient had a PCN reaction causing severe rash involving mucus membranes or skin necrosis: no Has patient had a PCN reaction that required hospitalization no Has patient had a  PCN reaction occurring within the last 10 years:no If all of the above answers are "NO", then may proceed with Cephalosporin use.   . Codeine Hives  . Hydrocodone Hives  . Tomato Rash   Social History:   reports that she has been smoking Cigarettes.  She has been smoking about 0.05 packs per day. She has never used smokeless tobacco. She reports that she does not drink alcohol or use drugs. Family History  Problem Relation Age of Onset  . Migraines Daughter   . Hypertension Mother   . Heart disease Mother   . Hypertension Father   . Heart disease Father   . Breast cancer Sister 70    deceased at age 62  . Migraines Son   . Hypertension    . Colon cancer Neg Hx   . Colon polyps Neg Hx   . Diabetes Neg Hx   . Kidney disease Neg Hx   . Liver disease Neg Hx   . Heart attack Neg Hx   . Stroke Neg Hx     Review of  Systems: Noncontributory  PHYSICAL EXAM: Last menstrual period 08/17/2016. General appearance - alert, well appearing, and in no distress Chest - clear to auscultation, no wheezes, rales or rhonchi, symmetric air entry Heart - normal rate and regular rhythm Abdomen - soft, nontender, nondistended, no masses or organomegaly Pelvic - examination not indicated Extremities - peripheral pulses normal, no pedal edema, no clubbing or cyanosis  Labs: Results for orders placed or performed during the hospital encounter of 12/07/16 (from the past 336 hour(s))  CBC   Collection Time: 12/07/16 10:10 AM  Result Value Ref Range   WBC 10.5 4.0 - 10.5 K/uL   RBC 4.50 3.87 - 5.11 MIL/uL   Hemoglobin 12.7 12.0 - 15.0 g/dL   HCT 38.2 36.0 - 46.0 %   MCV 84.9 78.0 - 100.0 fL   MCH 28.2 26.0 - 34.0 pg   MCHC 33.2 30.0 - 36.0 g/dL   RDW 16.5 (H) 11.5 - 15.5 %   Platelets 235 150 - 400 K/uL  Comprehensive metabolic panel   Collection Time: 12/07/16 10:10 AM  Result Value Ref Range   Sodium 136 135 - 145 mmol/L   Potassium 3.9 3.5 - 5.1 mmol/L   Chloride 104 101 - 111 mmol/L   CO2 25 22 - 32 mmol/L   Glucose, Bld 95 65 - 99 mg/dL   BUN 13 6 - 20 mg/dL   Creatinine, Ser 0.72 0.44 - 1.00 mg/dL   Calcium 9.3 8.9 - 10.3 mg/dL   Total Protein 7.8 6.5 - 8.1 g/dL   Albumin 4.1 3.5 - 5.0 g/dL   AST 20 15 - 41 U/L   ALT 17 14 - 54 U/L   Alkaline Phosphatase 67 38 - 126 U/L   Total Bilirubin 0.6 0.3 - 1.2 mg/dL   GFR calc non Af Amer >60 >60 mL/min   GFR calc Af Amer >60 >60 mL/min   Anion gap 7 5 - 15  Type and screen   Collection Time: 12/07/16 10:10 AM  Result Value Ref Range   ABO/RH(D) O POS    Antibody Screen NEG    Sample Expiration 12/21/2016    Extend sample reason NO TRANSFUSIONS OR PREGNANCY IN THE PAST 3 MONTHS   ABO/Rh   Collection Time: 12/07/16 10:10 AM  Result Value Ref Range   ABO/RH(D) O POS     Imaging Studies: No results found.  Assessment: Patient Active Problem List  Diagnosis Date Noted  . Nocturia 08/09/2015  . Fibromyalgia 06/25/2015  . GERD (gastroesophageal reflux disease) 06/16/2013  . Migraine 06/16/2013  . Atrial tachycardia, paroxysmal ([place]) 03/31/2013  . Headache 12/20/2012  . Syncope 06/20/2012  . Leiomyoma of uterus, unspecified 03/26/2012  . Family history of breast cancer 01/22/2012  . Seasonal and perennial allergic rhinitis 07/18/2010  . HAIR LOSS 07/04/2010  . ANXIETY DISORDER, SITUATIONAL, MILD 11/17/2009  . CHEST PAIN, LEFT 10/12/2009  . DYSURIA 08/26/2009  . MAMMOGRAM, ABNORMAL, RIGHT, HX OF 04/09/2009  . TOBACCO ABUSE 03/17/2009  . DECREASED HEARING, RIGHT EAR 10/27/2008  . BACK PAIN, LUMBAR 06/06/2007  . HOT FLASHES 07/18/2006    Plan: Patient will undergo surgical management with total vaginal hysterectomy with salpingectomy.   The risks of surgery were discussed in detail with the patient including but not limited to: bleeding which may require transfusion or reoperation; infection which may require antibiotics; injury to surrounding organs which may involve bowel, bladder, ureters ; need for additional procedures including laparoscopy or laparotomy; thromboembolic phenomenon, surgical site problems and other postoperative/anesthesia complications. Likelihood of success in alleviating the patient's condition was discussed. Routine postoperative instructions will be reviewed with the patient and her family in detail after surgery.  The patient concurred with the proposed plan, giving informed written consent for the surgery.  Patient has been NPO since last night she will remain NPO for procedure.  Anesthesia and OR aware.  Preoperative prophylactic antibiotics and SCDs ordered on call to the OR.  To OR when ready.  Kacey Vicuna L. Ihor Dow, M.D., [facility] 12/19/2016 7:14 AM

## 2016-12-19 NOTE — Transfer of Care (Signed)
Immediate Anesthesia Transfer of Care Note  Patient: Katherine [name]  Procedure(s) Performed: Procedure(s): HYSTERECTOMY VAGINAL (Bilateral)  Patient Location: PACU  Anesthesia Type:General  Level of Consciousness: awake, alert  and oriented  Airway & Oxygen Therapy: Patient Spontanous Breathing and Patient connected to nasal cannula oxygen  Post-op Assessment: Report given to RN and Post -op Vital signs reviewed and stable  Post vital signs: Reviewed and stable  Last Vitals:  Vitals:   12/19/16 0719  BP: 112/83  Pulse: 64  Resp: 16  Temp: 36.9 C    Last Pain:  Vitals:   12/19/16 0719  TempSrc: Oral  PainSc: 0-No pain      Patients Stated Pain Goal: 3 (02/54/27 0623)  Complications: No apparent anesthesia complications

## 2016-12-19 NOTE — Anesthesia Procedure Notes (Signed)
Procedure Name: LMA Insertion Date/Time: 12/19/2016 10:06 AM Performed by: Flossie Dibble Pre-anesthesia Checklist: Patient identified, Patient being monitored, Emergency Drugs available, Timeout performed and Suction available Patient Re-evaluated:Patient Re-evaluated prior to inductionOxygen Delivery Method: Circle System Utilized and Circle system utilized Preoxygenation: Pre-oxygenation with 100% oxygen Intubation Type: IV induction LMA: LMA inserted LMA Size: 4.0 Number of attempts: 1 Placement Confirmation: positive ETCO2 and breath sounds checked- equal and bilateral Tube secured with: Tape Dental Injury: Teeth and Oropharynx as per pre-operative assessment

## 2016-12-19 NOTE — Progress Notes (Signed)
Patient has arrived and been settled,  All new orders completed, all admission orders completed.  Report given to Gerald Stabs, RN Charge to assume care of patient due to nurse transfer.

## 2016-12-19 NOTE — Anesthesia Postprocedure Evaluation (Addendum)
Anesthesia Post Note  Patient: Katherine [name]  Procedure(s) Performed: Procedure(s) (LRB): HYSTERECTOMY VAGINAL (Bilateral)  Patient location during evaluation: PACU Anesthesia Type: General Level of consciousness: awake and sedated Pain management: pain level controlled Vital Signs Assessment: post-procedure vital signs reviewed and stable Respiratory status: spontaneous breathing Cardiovascular status: stable Postop Assessment: no signs of nausea or vomiting Anesthetic complications: no        Last Vitals:  Vitals:   12/19/16 1330 12/19/16 1345  BP: 108/77 104/69  Pulse: 63 64  Resp: 12 12  Temp:      Last Pain:  Vitals:   12/19/16 1330  TempSrc:   PainSc: 5    Pain Goal: Patients Stated Pain Goal: 3 (12/19/16 0719)               Brylynn Hanssen JR,JOHN [facility]

## 2016-12-19 NOTE — Anesthesia Preprocedure Evaluation (Signed)
Anesthesia Evaluation  Patient identified by MRN, date of birth, ID band Patient awake    Reviewed: Allergy & Precautions, NPO status , Patient's Chart, lab work & pertinent test results  Airway Mallampati: I       Dental no notable dental hx.    Pulmonary Current Smoker,    Pulmonary exam normal        Cardiovascular Normal cardiovascular exam     Neuro/Psych    GI/Hepatic Neg liver ROS, GERD  Medicated and Controlled,  Endo/Other  negative endocrine ROS  Renal/GU negative Renal ROS  negative genitourinary   Musculoskeletal   Abdominal Normal abdominal exam  (+)   Peds  Hematology negative hematology ROS (+)   Anesthesia Other Findings ECHO STRESS TEST WO IMAGE AGENT Wynonia Musty)  Order# [id number]  Reading physician: Lelon Perla, MD Ordering physician: Fay Records, MD Study date: 12/16/15 Result Notes   Notes Recorded by Rodman Key, RN on 12/23/2015 at 2:13 PM Left detailed message on home voice mail (as per FYI note) of results per Dr. Harrington Challenger.  Advised to call back with any questions/concerns. ------  Notes Recorded by Lynann Bologna, RN on 12/22/2015 at 11:53 AM Left pt a message to call back. ------  Notes Recorded by Fay Records, MD on 12/17/2015 at 11:05 PM Stress echo is normal  Excellent exercise capacity  Symptoms do not appear to be coming from heart   Study Result   Result status: Final result                          [facility]                        [address], [place] [address]                            [phone]  ------------------------------------------------------------------- Stress Echocardiography  Patient:    Katherine [name], Katherine [name] MR #:       [id number] Study Date: 12/16/2015 Gender:     F Age:        46 Height:     165.1 cm Weight:     66.8 kg BSA:        1.76 m^2 Pt. Status: Room:   ATTENDING    Kirk Ruths   [facility]     Dorris Carnes, M.D.  PERFORMING   [facility], Outpatient  SONOGRAPHER  [facility], RDCS  cc:  -------------------------------------------------------------------  ------------------------------------------------------------------- Indications:      Chest Pain (R07.9).  ------------------------------------------------------------------- History:   PMH:  Palpitations, SVT, Asthma.  Chest pain.  Syncope and dyspnea.  Risk factors:  Family history of coronary artery disease. Current tobacco use.  ------------------------------------------------------------------- Study Conclusions  - Stress ECG conclusions: The stress ECG was normal. - Staged echo: There was no echocardiographic evidence for   stress-induced ischemia.  Impressions:  - Stress echocardiogram with no chest pain, no ST changes and no   stress-induced wall motion abnormalities; normal stress   echocardiogram.  Bruce protocol. Stress echocardiography.  Birthdate:  Patient birthdate: April 29, 1971.  Age:  Patient is 46 yr old.  Sex:  Gender: female.    BMI: 24.5 kg/m^2.  Blood pressure:     108/72  Patient status:  Outpatient.  Study date:  Study date: 12/16/2015. Study time: 02:53 PM.  -------------------------------------------------------------------  ------------------------------------------------------------------- Baseline ECG:  Normal sinus rhythm, cannot R/O prior septal MI.  ------------------------------------------------------------------- Stress protocol:  +--------------+---------+--------+---+------------+--------------+ Stage         Time intoTime    HR BP (mmHg)   Symptoms                     test     into                                                         phase                                 +--------------+---------+--------+---+------------+--------------+ Baseline      -----------------68 118/75 (89) None            +--------------+---------+--------+---+------------+--------------+ Stage 1       3 min    3 min   98 126/100     --------------                                   (109)                      +--------------+---------+--------+---+------------+--------------+ Stage 2       6 min    3 min   11892/73 (79)  Dizziness      +--------------+---------+--------+---+------------+--------------+ Stage 3       9 min    3 min   146155/75 (102)Dyspnea,                                                     fatigue        +--------------+---------+--------+---+------------+--------------+ Stage 4       9 min 38 38 sec  166189/89 (122)Dyspnea,                     sec                             fatigue        +--------------+---------+--------+---+------------+--------------+ Immediate post-----------------142175/61 (99) -------------- stress                                                       +--------------+---------+--------+---+------------+--------------+ Recovery; 1   -----------------107-------------------------- min                                                          +--------------+---------+--------+---+------------+--------------+ Recovery; 2   -----------------91 -------------------------- min                                                          +--------------+---------+--------+---+------------+--------------+  Recovery; 3   -----------------83 137/71 (93) -------------- min                                                          +--------------+---------+--------+---+------------+--------------+ Recovery; 5   -----------------85 124/67 (86) -------------- min                                                          +--------------+---------+--------+---+------------+--------------+ Recovery; 10  -----------------82 112/59 (77) -------------- min                                                           +--------------+---------+--------+---+------------+--------------+  ------------------------------------------------------------------- Stress results:   Maximal heart rate during stress was 166 bpm (95% of maximal predicted heart rate). The maximal predicted heart rate was 175 bpm.The target heart rate was achieved. The heart rate response to stress was normal. There was a normal resting blood pressure with an appropriate response to stress. The rate-pressure product for the peak heart rate and blood pressure was 31374 mm Hg/min.  The patient experienced no chest pain during stress.  ------------------------------------------------------------------- Stress ECG:   Isolated ventricular ectopy.  The stress ECG was normal.  ------------------------------------------------------------------- Baseline:  - LV size was normal. - LV global systolic function was normal. The estimated LV ejection   fraction was 60%. - Normal wall motion; no LV regional wall motion abnormalities.  Peak stress:  - LV size was reduced appropriately. - LV global systolic function was vigorous. The estimated LV   ejection fraction was 80%. - No evidence for new LV regional wall motion abnormalities.  ------------------------------------------------------------------- Stress echo results:     Left ventricular ejection fraction was normal at rest and with stress. There was no echocardiographic evidence for stress-induced ischemia.  ------------------------------------------------------------------- Prepared and Electronically Authenticated by  Kirk Ruths 2017-03-23T17:47:21 PACS Images   Show images for Echo stress Patient Information   Patient Name    Reproductive/Obstetrics                             Anesthesia Physical Anesthesia Plan  ASA: II  Anesthesia Plan: General   Post-op Pain Management:     Induction: Intravenous  Airway Management Planned: LMA  Additional Equipment:   Intra-op Plan:   Post-operative Plan:   Informed Consent: I have reviewed the patients History and Physical, chart, labs and discussed the procedure including the risks, benefits and alternatives for the proposed anesthesia with the patient or authorized representative who has indicated his/her understanding and acceptance.     Plan Discussed with: CRNA and Surgeon  Anesthesia Plan Comments:         Anesthesia Quick Evaluation

## 2016-12-19 NOTE — Brief Op Note (Signed)
12/19/2016  12:42 PM  PATIENT:  Katherine [name]  46 y.o. female  PRE-OPERATIVE DIAGNOSIS:  AUB 58150  POST-OPERATIVE DIAGNOSIS:  ABNORMAL UTERINE BLEEDING  PROCEDURE:  Procedure(s): HYSTERECTOMY VAGINAL (Bilateral)  SURGEON:  Surgeon(s) and Role:    * Lavonia Drafts, MD - Primary    * Mora Bellman, MD - Assisting  ANESTHESIA:   general  EBL:  Total I/O In: 3100 [I.V.:3100] Out: 425 [Urine:100; Blood:325]  BLOOD ADMINISTERED:none  DRAINS: none   LOCAL MEDICATIONS USED:  OTHER dilute vasopressin solution  SPECIMEN:  Source of Specimen:  uterus and cervix   DISPOSITION OF SPECIMEN:  PATHOLOGY  COUNTS:  YES  TOURNIQUET:  * No tourniquets in log *  DICTATION: .Note written in EPIC  PLAN OF CARE: Admit for overnight observation  PATIENT DISPOSITION:  PACU - hemodynamically stable.   Delay start of Pharmacological VTE agent (>24hrs) due to surgical blood loss or risk of bleeding: yes  Complications: none immediate  Magon Croson L. Harraway-Smith, M.D., [facility]

## 2016-12-19 NOTE — Op Note (Signed)
12/19/2016  12:42 PM  PATIENT:  Katherine [name]  46 y.o. female  PRE-OPERATIVE DIAGNOSIS:  AUB 58150  POST-OPERATIVE DIAGNOSIS:  ABNORMAL UTERINE BLEEDING  PROCEDURE:  Procedure(s): HYSTERECTOMY VAGINAL (Bilateral)  SURGEON:  Surgeon(s) and Role:    * Lavonia Drafts, MD - Primary    * Mora Bellman, MD - Assisting  ANESTHESIA:   general  EBL:  Total I/O In: 3100 [I.V.:3100] Out: 425 [Urine:100; Blood:325]  BLOOD ADMINISTERED:none  DRAINS: none   LOCAL MEDICATIONS USED:  OTHER dilute vasopressin solution  SPECIMEN:  Source of Specimen:  uterus and cervix   DISPOSITION OF SPECIMEN:  PATHOLOGY  COUNTS:  YES  TOURNIQUET:  * No tourniquets in log *  DICTATION: .Note written in EPIC  PLAN OF CARE: Admit for overnight observation  PATIENT DISPOSITION:  PACU - hemodynamically stable.   Delay start of Pharmacological VTE agent (>24hrs) due to surgical blood loss or risk of bleeding: yes  Complications: none immediate   INDICATIONS: The patient is a 46 y.o. [id number] with history of symptomatic uterine fibroids/menorrhagia. The patient made a decision to undergo definite surgical treatment. On the preoperative visit, the risks, benefits, indications, and alternatives of the procedure were reviewed with the patient.  On the day of surgery, the risks of surgery were again discussed with the patient including but not limited to: bleeding which may require transfusion or reoperation; infection which may require antibiotics; injury to bowel, bladder, ureters or other surrounding organs; need for additional procedures; thromboembolic phenomenon, incisional problems and other postoperative/anesthesia complications. Written informed consent was obtained.    OPERATIVE FINDINGS: A 10 week size uterus with normal tubes and ovaries bilaterally.  DESCRIPTION OF PROCEDURE:  The patient received intravenous antibiotics and had sequential compression devices applied to her lower  extremities while in the preoperative area.  She was then taken to the operating room where general anesthesia was administered and was found to be adequate.  She was placed in the dorsal lithotomy position, and was prepped and draped in a sterile manner.  The patients bladder was drained with a red rubber catheter. After an adequate timeout was performed, attention was turned to her pelvis.  A weighted speculum was then placed in the vagina, and the anterior and posterior lips of the cervix were grasped bilaterally with tenaculums.  The cervix was then injected circumferentially with a dilute Vasopression solution.  The cervix was then circumferentially incised, and the bladder was dissected off the pubocervical fascia without complication.  The posterior cul-de-sac was entered sharply without difficulty. A suture was placed on the posterior vagina.  A long weighted speculum was inserted into the posterior cul-de-sac.  The Heaney clamp was then used to clamp the uterosacral ligaments on either side.  They were then cut and sutured ligated with 0 Vicryl, and the ligated uterosacral ligaments were transfixed to the posterior lateral vaginal epithelium to further support the vagina and provide hemostasis. Of note, all sutures used in this case were 0 Vicryl unless otherwise noted.   The cardinal ligaments were then clamped, cut and ligated. The anterior cul-de-sac was then entered sharpely. The uterine vessels and broad ligaments were then serially clamped with the Heaney clamps, cut, and suture ligated on both sides.  Excellent hemostasis was noted at this point.  Due to the size of the uterus, it was morcellated using a coring technique.  The uterus was then delivered via the posterior cul-de-sac, and the cornua were clamped with the Heaney clamps, transected, and the uterus  was delivered and sent to pathology. These pedicles were then suture ligated to ensure hemostasis.  After completion of the hysterectomy, I  attempted to locate the fallopian tubes which I could not identify.  Therefore, the fallopian tubes were not removed. All pedicles from the uterosacral ligament to the cornua were examined hemostasis was confirmed.  The vaginal cuff was reefed in a running locked fashion then reapproximated using figure of eight sutures care was given to incorporate the uterosacral pedicles bilaterally.  All instruments were then removed from the pelvis and a vaginal packing saturated with estrogen cream was placed.  The patient tolerated the procedure well.  All instruments, needles, and sponge counts were correct x 2. The patient was taken to the recovery room in stable condition.  There uterus weighted >320 grams.  Simonne Boulos L. Harraway-Smith, M.D., [facility]

## 2016-12-20 ENCOUNTER — Encounter (HOSPITAL_COMMUNITY): Payer: Self-pay | Admitting: Obstetrics & Gynecology

## 2016-12-20 ENCOUNTER — Encounter (HOSPITAL_COMMUNITY): Payer: Self-pay

## 2016-12-20 DIAGNOSIS — N920 Excessive and frequent menstruation with regular cycle: Secondary | ICD-10-CM | POA: Diagnosis not present

## 2016-12-20 LAB — CBC
HCT: 34 % — ABNORMAL LOW (ref 36.0–46.0)
Hemoglobin: 11.4 g/dL — ABNORMAL LOW (ref 12.0–15.0)
MCH: 28.3 pg (ref 26.0–34.0)
MCHC: 33.5 g/dL (ref 30.0–36.0)
MCV: 84.4 fL (ref 78.0–100.0)
Platelets: 244 K/uL (ref 150–400)
RBC: 4.03 MIL/uL (ref 3.87–5.11)
RDW: 15.8 % — ABNORMAL HIGH (ref 11.5–15.5)
WBC: 14.5 K/uL — ABNORMAL HIGH (ref 4.0–10.5)

## 2016-12-20 MED ORDER — OXYCODONE-ACETAMINOPHEN 5-325 MG PO TABS: 1.0000 | ORAL_TABLET | ORAL | 0 refills | Status: DC | PRN

## 2016-12-20 MED ORDER — IBUPROFEN 600 MG PO TABS: 600.0000 mg | ORAL_TABLET | Freq: Four times a day (QID) | ORAL | 0 refills | Status: DC | PRN

## 2016-12-20 MED ORDER — DOCUSATE SODIUM 100 MG PO CAPS: 100.0000 mg | ORAL_CAPSULE | Freq: Two times a day (BID) | ORAL | 0 refills | Status: DC

## 2016-12-20 NOTE — Discharge Summary (Signed)
Physician Discharge Summary  Patient ID: Katherine [name] MRN: [id number] DOB/AGE: Feb 03, 1971 46 y.o.  Admit date: 12/19/2016 Discharge date: 12/20/2016  Admission Diagnoses:    Discharge Diagnoses:  Active Problems:   Post-operative state   Discharged Condition: good  Hospital Course: Patient had an uncomplicated surgery; for further details of this surgery, please refer to the operative note. Furthermore, the patient had an uncomplicated postoperative course.  By time of discharge, her pain was controlled on oral pain medications; she was ambulating, voiding without difficulty, tolerating regular diet and passing flatus.  She was deemed stable for discharge to home.  Pt has multiple allergies but, she reports that she had no issues taking the Motrin or the Percocet.  Consults: None  Significant Diagnostic Studies: labs: CBC  Treatments: surgery: Total vaginal hysterectomy  Discharge Exam: Blood pressure 128/81, pulse 66, temperature 98.3 F (36.8 C), temperature source Oral, resp. rate 18, height 5\' 5"  (1.651 m), weight 171 lb (77.6 kg), last menstrual period 08/17/2016, SpO2 100 %. General appearance: alert and no distress Resp: clear to auscultation bilaterally Cardio: regular rate and rhythm, S1, S2 normal, no murmur, click, rub or gallop GI: soft, non-tender; bowel sounds normal; no masses,  no organomegaly Extremities: extremities normal, atraumatic, no cyanosis or edema  Disposition: 01-Home or Self Care  Discharge Instructions    Call MD for:  difficulty breathing, headache or visual disturbances    Complete by:  As directed    Call MD for:  extreme fatigue    Complete by:  As directed    Call MD for:  hives    Complete by:  As directed    Call MD for:  persistant dizziness or light-headedness    Complete by:  As directed    Call MD for:  persistant nausea and vomiting    Complete by:  As directed    Call MD for:  redness, tenderness, or signs of infection (pain,  swelling, redness, odor or green/yellow discharge around incision site)    Complete by:  As directed    Call MD for:  severe uncontrolled pain    Complete by:  As directed    Call MD for:  temperature >100.4    Complete by:  As directed    Diet - low sodium heart healthy    Complete by:  As directed    Driving Restrictions    Complete by:  As directed    NO driving for 2 weeks   Increase activity slowly    Complete by:  As directed    Lifting restrictions    Complete by:  As directed    No heavy lifting for 2 weeks   Sexual Activity Restrictions    Complete by:  As directed    No sexual activity for 6 weeks     Allergies as of 12/20/2016      Reactions   Morphine Anaphylaxis, Hives, Swelling   Shellfish Allergy Anaphylaxis   Penicillins Other (See Comments)   Yeast infection Has patient had a PCN reaction causing immediate rash, facial/tongue/throat swelling, SOB or lightheadedness with hypotension: no Has patient had a PCN reaction causing severe rash involving mucus membranes or skin necrosis: no Has patient had a PCN reaction that required hospitalization no Has patient had a PCN reaction occurring within the last 10 years:no If all of the above answers are "NO", then may proceed with Cephalosporin use.   Codeine Hives   Hydrocodone Hives   Pt can take percocet  Tomato Rash      Medication List    TAKE these medications   albuterol 108 (90 Base) MCG/ACT inhaler Commonly known as:  PROVENTIL HFA;VENTOLIN HFA Inhale 2 puffs into the lungs every 6 (six) hours as needed for wheezing or shortness of breath.   diltiazem 240 MG 24 hr capsule Commonly known as:  CARDIZEM CD Take 240 mg by mouth daily.   docusate sodium 100 MG capsule Commonly known as:  COLACE Take 1 capsule (100 mg total) by mouth 2 (two) times daily.   HAIR SKIN AND NAILS FORMULA PO Take 1 tablet by mouth daily.   ibuprofen 600 MG tablet Commonly known as:  ADVIL,MOTRIN Take 1 tablet (600 mg  total) by mouth every 6 (six) hours as needed (mild pain).   labetalol 100 MG tablet Commonly known as:  NORMODYNE Take 1 tablet (100 mg total) by mouth 2 (two) times daily. What changed:  when to take this   omeprazole 20 MG capsule Commonly known as:  PRILOSEC Take 1 capsule (20 mg total) by mouth 2 (two) times daily before a meal.   oxyCODONE-acetaminophen 5-325 MG tablet Commonly known as:  PERCOCET/ROXICET Take 1-2 tablets by mouth every 4 (four) hours as needed (moderate to severe pain (when tolerating fluids)).      Follow-up Information    Lavonia Drafts, MD Follow up in 2 week(s).   Specialty:  Obstetrics and Gynecology Contact information: [place] [address] [phone]           Signed: Lylah Lantis 12/20/2016, 11:34 AM

## 2016-12-20 NOTE — Progress Notes (Signed)
Foley and Vaginal packing removed per order. Vaginal packing had minimal bloody drainage. Pt  Tolerated  Well.

## 2016-12-20 NOTE — Discharge Instructions (Signed)
Vaginal Hysterectomy, Care After °Refer to this sheet in the next few weeks. These instructions provide you with information about caring for yourself after your procedure. Your health care provider may also give you more specific instructions. Your treatment has been planned according to current medical practices, but problems sometimes occur. Call your health care provider if you have any problems or questions after your procedure. °What can I expect after the procedure? °After the procedure, it is common to have: °· Pain. °· Soreness and numbness in your incision areas. °· Vaginal bleeding and discharge. °· Constipation. °· Temporary problems emptying the bladder. °· Feelings of sadness or other emotions. °Follow these instructions at home: °Medicines  °· Take over-the-counter and prescription medicines only as told by your health care provider. °· If you were prescribed an antibiotic medicine, take it as told by your health care provider. Do not stop taking the antibiotic even if you start to feel better. °· Do not drive or operate heavy machinery while taking prescription pain medicine. °Activity  °· Return to your normal activities as told by your health care provider. Ask your health care provider what activities are safe for you. °· Get regular exercise as told by your health care provider. You may be told to take short walks every day and go farther each time. °· Do not lift anything that is heavier than 10 lb (4.5 kg). °General instructions  ° °· Do not put anything in your vagina for 6 weeks after your surgery or as told by your health care provider. This includes tampons and douches. °· Do not have sex until your health care provider says you can. °· Do not take baths, swim, or use a hot tub until your health care provider approves. °· Drink enough fluid to keep your urine clear or pale yellow. °· Do not drive for 24 hours if you were given a sedative. °· Keep all follow-up visits as told by your health  care provider. This is important. °Contact a health care provider if: °· Your pain medicine is not helping. °· You have a fever. °· You have redness, swelling, or pain at your incision site. °· You have blood, pus, or a bad-smelling discharge from your vagina. °· You continue to have difficulty urinating. °Get help right away if: °· You have severe abdominal or back pain. °· You have heavy bleeding from your vagina. °· You have chest pain or shortness of breath. °This information is not intended to replace advice given to you by your health care provider. Make sure you discuss any questions you have with your health care provider. °Document Released: 01/03/2016 Document Revised: 02/17/2016 Document Reviewed: 09/26/2015 °Elsevier Interactive Patient Education © 2017 [employer] ° °

## 2016-12-21 DIAGNOSIS — D251 Intramural leiomyoma of uterus: Secondary | ICD-10-CM

## 2016-12-21 DIAGNOSIS — R102 Pelvic and perineal pain: Secondary | ICD-10-CM

## 2017-01-21 ENCOUNTER — Emergency Department (HOSPITAL_COMMUNITY): Payer: Medicaid Other

## 2017-01-21 ENCOUNTER — Emergency Department (HOSPITAL_COMMUNITY)
Admission: EM | Admit: 2017-01-21 | Discharge: 2017-01-21 | Disposition: A | Discharge status: Home / Self Care | Payer: Medicaid Other | Attending: Emergency Medicine | Admitting: Emergency Medicine

## 2017-01-21 ENCOUNTER — Encounter (HOSPITAL_COMMUNITY): Payer: Self-pay

## 2017-01-21 DIAGNOSIS — F17210 Nicotine dependence, cigarettes, uncomplicated: Secondary | ICD-10-CM | POA: Insufficient documentation

## 2017-01-21 DIAGNOSIS — J45909 Unspecified asthma, uncomplicated: Secondary | ICD-10-CM | POA: Insufficient documentation

## 2017-01-21 DIAGNOSIS — R10811 Right upper quadrant abdominal tenderness: Secondary | ICD-10-CM | POA: Diagnosis not present

## 2017-01-21 DIAGNOSIS — R071 Chest pain on breathing: Secondary | ICD-10-CM | POA: Insufficient documentation

## 2017-01-21 DIAGNOSIS — M7918 Myalgia, other site: Secondary | ICD-10-CM

## 2017-01-21 DIAGNOSIS — R52 Pain, unspecified: Secondary | ICD-10-CM

## 2017-01-21 DIAGNOSIS — Z79899 Other long term (current) drug therapy: Secondary | ICD-10-CM | POA: Insufficient documentation

## 2017-01-21 LAB — URINALYSIS, ROUTINE W REFLEX MICROSCOPIC
BILIRUBIN URINE: NEGATIVE
Bacteria, UA: NONE SEEN
GLUCOSE, UA: NEGATIVE mg/dL
Ketones, ur: 20 mg/dL — AB
LEUKOCYTES UA: NEGATIVE
Nitrite: NEGATIVE
PH: 5 (ref 5.0–8.0)
Protein, ur: NEGATIVE mg/dL
SPECIFIC GRAVITY, URINE: 1.024 (ref 1.005–1.030)

## 2017-01-21 LAB — CBC
HEMATOCRIT: 36.8 % (ref 36.0–46.0)
HEMOGLOBIN: 12.2 g/dL (ref 12.0–15.0)
MCH: 27.2 pg (ref 26.0–34.0)
MCHC: 33.2 g/dL (ref 30.0–36.0)
MCV: 82.1 fL (ref 78.0–100.0)
Platelets: 272 10*3/uL (ref 150–400)
RBC: 4.48 MIL/uL (ref 3.87–5.11)
RDW: 14.3 % (ref 11.5–15.5)
WBC: 8.8 10*3/uL (ref 4.0–10.5)

## 2017-01-21 LAB — BASIC METABOLIC PANEL
ANION GAP: 10 (ref 5–15)
BUN: 6 mg/dL (ref 6–20)
CALCIUM: 9.4 mg/dL (ref 8.9–10.3)
CO2: 21 mmol/L — ABNORMAL LOW (ref 22–32)
Chloride: 106 mmol/L (ref 101–111)
Creatinine, Ser: 0.57 mg/dL (ref 0.44–1.00)
GLUCOSE: 84 mg/dL (ref 65–99)
POTASSIUM: 3.8 mmol/L (ref 3.5–5.1)
SODIUM: 137 mmol/L (ref 135–145)

## 2017-01-21 LAB — I-STAT TROPONIN, ED: TROPONIN I, POC: 0 ng/mL (ref 0.00–0.08)

## 2017-01-21 MED ORDER — ONDANSETRON HCL 4 MG/2ML IJ SOLN
4.0000 mg | Freq: Once | INTRAMUSCULAR | Status: AC
  Administered 2017-01-21: 4 mg via INTRAVENOUS
  Filled 2017-01-21: qty 2

## 2017-01-21 MED ORDER — TRAMADOL HCL 50 MG PO TABS: 50.0000 mg | ORAL_TABLET | Freq: Four times a day (QID) | ORAL | 0 refills | Status: DC | PRN

## 2017-01-21 MED ORDER — FENTANYL CITRATE (PF) 100 MCG/2ML IJ SOLN: 100.0000 ug | Freq: Once | INTRAMUSCULAR | Status: DC

## 2017-01-21 MED ORDER — FENTANYL CITRATE (PF) 100 MCG/2ML IJ SOLN
50.0000 ug | Freq: Once | INTRAMUSCULAR | Status: AC
  Administered 2017-01-21: 50 ug via INTRAVENOUS
  Filled 2017-01-21: qty 2

## 2017-01-21 MED ORDER — FENTANYL CITRATE (PF) 100 MCG/2ML IJ SOLN
100.0000 ug | Freq: Once | INTRAMUSCULAR | Status: AC
  Administered 2017-01-21: 100 ug via INTRAVENOUS
  Filled 2017-01-21: qty 2

## 2017-01-21 MED ORDER — IOPAMIDOL (ISOVUE-370) INJECTION 76%
INTRAVENOUS | Status: AC
  Filled 2017-01-21: qty 100

## 2017-01-21 MED ORDER — IOPAMIDOL (ISOVUE-370) INJECTION 76%
INTRAVENOUS | Status: AC
  Administered 2017-01-21: 100 mL
  Filled 2017-01-21: qty 100

## 2017-01-21 NOTE — ED Triage Notes (Signed)
Patient complains of central chest pain that she describes as intermittent since yesterday. Patient reports worse with inspiration and movement, NAD

## 2017-01-21 NOTE — Discharge Instructions (Signed)
There were no signs of complication from the recent surgery, gallbladder disease or lung problems.  Treatment for the pain will include over-the-counter pain medication such as Tylenol or Motrin, heat applications, and gentle walking, and stretching.  Follow-up with your primary care doctor or gynecologist for ongoing management of your discomfort.

## 2017-01-21 NOTE — ED Notes (Signed)
Pt taken to CT via bed

## 2017-01-21 NOTE — ED Provider Notes (Signed)
[facility] DEPT Provider Note   CSN: [id number] Arrival date & time: 01/21/17  1327     History   Chief Complaint Chief Complaint  Patient presents with  . Chest Pain    HPI Katherine [name] is a 46 y.o. female.  Pt presents to the ED today with right sided CP that started yesterday.  The pt said that it hurts when she takes a deep breath.  The pt said that it also hurts with movement.  Pt has had some nausea over the past few days.  She also notes that she had a hysterectomy on 3/27.  She denies any leg pain or swelling.      Past Medical History:  Diagnosis Date  . Anxiety   . Asthma   . Dysrhythmia   . Fibromyalgia   . GERD (gastroesophageal reflux disease)   . Hot flashes   . Migraine   . Palpitations   . Pneumonia    with cavitation of left lower lobe  . Tuberculosis    exposure as a child, get checked frequently  . Uterine fibroid     Patient Active Problem List   Diagnosis Date Noted  . Pelvic pain in female 12/21/2016  . Fibroids, intramural 12/21/2016  . Post-operative state 12/19/2016  . Nocturia 08/09/2015  . Fibromyalgia 06/25/2015  . GERD (gastroesophageal reflux disease) 06/16/2013  . Migraine 06/16/2013  . Atrial tachycardia, paroxysmal ([place]) 03/31/2013  . Headache 12/20/2012  . Syncope 06/20/2012  . Fibroids 03/26/2012  . Family history of breast cancer 01/22/2012  . Seasonal and perennial allergic rhinitis 07/18/2010  . HAIR LOSS 07/04/2010  . ANXIETY DISORDER, SITUATIONAL, MILD 11/17/2009  . CHEST PAIN, LEFT 10/12/2009  . DYSURIA 08/26/2009  . MAMMOGRAM, ABNORMAL, RIGHT, HX OF 04/09/2009  . TOBACCO ABUSE 03/17/2009  . DECREASED HEARING, RIGHT EAR 10/27/2008  . BACK PAIN, LUMBAR 06/06/2007  . HOT FLASHES 07/18/2006    Past Surgical History:  Procedure Laterality Date  . TUBAL LIGATION  1993  . VAGINAL HYSTERECTOMY Bilateral 12/19/2016   Procedure: HYSTERECTOMY VAGINAL;  Surgeon: Lavonia Drafts, MD;  Location: [facility];   Service: Gynecology;  Laterality: Bilateral;  . WISDOM TOOTH EXTRACTION      OB History    Gravida Para Term Preterm AB Living   2 2 1 1   2    SAB TAB Ectopic Multiple Live Births                   Home Medications    Prior to Admission medications   Medication Sig Start Date End Date Taking? Authorizing Provider  albuterol (PROVENTIL HFA;VENTOLIN HFA) 108 (90 Base) MCG/ACT inhaler Inhale 2 puffs into the lungs every 6 (six) hours as needed for wheezing or shortness of breath.   Yes Historical Provider, MD  diltiazem (CARDIZEM CD) 240 MG 24 hr capsule Take 240 mg by mouth daily.  06/28/16  Yes Historical Provider, MD  ibuprofen (ADVIL,MOTRIN) 600 MG tablet Take 1 tablet (600 mg total) by mouth every 6 (six) hours as needed (mild pain). 12/20/16  Yes Lavonia Drafts, MD  labetalol (NORMODYNE) 100 MG tablet Take 1 tablet (100 mg total) by mouth 2 (two) times daily. Patient taking differently: Take 100 mg by mouth daily.  10/14/15  Yes Fay Records, MD  meloxicam (MOBIC) 15 MG tablet Take 15 mg by mouth daily. 12/06/16  Yes Historical Provider, MD  Multiple Vitamins-Minerals (HAIR SKIN AND NAILS FORMULA PO) Take 1 tablet by mouth daily.   Yes  Historical Provider, MD  omeprazole (PRILOSEC) 20 MG capsule Take 1 capsule (20 mg total) by mouth 2 (two) times daily before a meal. 08/09/15  Yes Josalyn Funches, MD  SUMAtriptan (IMITREX) 50 MG tablet Take 50 mg by mouth daily as needed for migraine. 11/08/16  Yes Historical Provider, MD  docusate sodium (COLACE) 100 MG capsule Take 1 capsule (100 mg total) by mouth 2 (two) times daily. Patient not taking: Reported on 01/21/2017 12/20/16   Lavonia Drafts, MD  oxyCODONE-acetaminophen (PERCOCET/ROXICET) 5-325 MG tablet Take 1-2 tablets by mouth every 4 (four) hours as needed (moderate to severe pain (when tolerating fluids)). Patient not taking: Reported on 01/21/2017 12/20/16   Lavonia Drafts, MD    Family History Family History    Problem Relation Age of Onset  . Migraines Daughter   . Hypertension Mother   . Heart disease Mother   . Hypertension Father   . Heart disease Father   . Breast cancer Sister 74    deceased at age 76  . Migraines Son   . Hypertension    . Colon cancer Neg Hx   . Colon polyps Neg Hx   . Diabetes Neg Hx   . Kidney disease Neg Hx   . Liver disease Neg Hx   . Heart attack Neg Hx   . Stroke Neg Hx     Social History Social History  Substance Use Topics  . Smoking status: Current Some Day Smoker    Packs/day: 0.05    Types: Cigarettes  . Smokeless tobacco: Never Used     Comment: smokes 2 cigarettes per day and more if she is stressed  . Alcohol use No     Allergies   Morphine; Shellfish allergy; Penicillins; Codeine; Hydrocodone; and Tomato   Review of Systems Review of Systems  Cardiovascular: Positive for chest pain.  Gastrointestinal: Positive for nausea.  All other systems reviewed and are negative.    Physical Exam Updated Vital Signs BP 120/87 (BP Location: Right Arm)   Pulse 62   Temp 98.8 F (37.1 C) (Oral)   Resp 16   Ht 5\' 5"  (1.651 m)   Wt 170 lb (77.1 kg)   LMP 11/13/2016   SpO2 98%   BMI 28.29 kg/m   Physical Exam  Constitutional: She is oriented to person, place, and time. She appears well-developed and well-nourished.  HENT:  Head: Normocephalic and atraumatic.  Right Ear: External ear normal.  Left Ear: External ear normal.  Nose: Nose normal.  Mouth/Throat: Oropharynx is clear and moist.  Eyes: Conjunctivae and EOM are normal. Pupils are equal, round, and reactive to light.  Neck: Normal range of motion. Neck supple.  Cardiovascular: Normal rate, regular rhythm, normal heart sounds and intact distal pulses.   Pulmonary/Chest: Effort normal and breath sounds normal.  Abdominal: Soft. Bowel sounds are normal. There is tenderness in the right upper quadrant.  Musculoskeletal: Normal range of motion.  Neurological: She is alert and  oriented to person, place, and time.  Skin: Skin is warm and dry.  Psychiatric: She has a normal mood and affect. Her behavior is normal. Judgment and thought content normal.  Nursing note and vitals reviewed.    ED Treatments / Results  Labs (all labs ordered are listed, but only abnormal results are displayed) Labs Reviewed  BASIC METABOLIC PANEL - Abnormal; Notable for the following:       Result Value   CO2 21 (*)    All other components within normal limits  CBC  URINALYSIS, ROUTINE W REFLEX MICROSCOPIC  HEPATIC FUNCTION PANEL  LIPASE, BLOOD  I-STAT TROPOININ, ED    EKG  EKG Interpretation  Date/Time:  Sunday January 21 2017 13:34:32 EDT Ventricular Rate:  60 PR Interval:  152 QRS Duration: 82 QT Interval:  418 QTC Calculation: 418 R Axis:   112 Text Interpretation:  Normal sinus rhythm Septal infarct , age undetermined Lateral infarct , age undetermined T wave abnormality, consider anterior ischemia Abnormal ECG Confirmed by Gilford Raid MD, Keondria Siever ([id number]) on 01/21/2017 2:59:47 PM       Radiology Dg Chest 2 View  Result Date: 01/21/2017 CLINICAL DATA:  Chest pain EXAM: CHEST  2 VIEW COMPARISON:  September 23, 2016 FINDINGS: Lungs are clear. Heart size and pulmonary vascularity are normal. No adenopathy. No bone lesions. No pneumothorax. IMPRESSION: No edema or consolidation. Electronically Signed   By: Lowella Grip III M.D.   On: 01/21/2017 15:01    Procedures Procedures (including critical care time)  Medications Ordered in ED Medications  iopamidol (ISOVUE-370) 76 % injection (not administered)  fentaNYL (SUBLIMAZE) injection 100 mcg (not administered)  ondansetron (ZOFRAN) injection 4 mg (4 mg Intravenous Given 01/21/17 1437)  fentaNYL (SUBLIMAZE) injection 50 mcg (50 mcg Intravenous Given 01/21/17 1437)     Initial Impression / Assessment and Plan / ED Course  I have reviewed the triage vital signs and the nursing notes.  Pertinent labs & imaging results  that were available during my care of the patient were reviewed by me and considered in my medical decision making (see chart for details).    Pt will be signed out to Dr. Eulis Foster pending CT angio chest result and RUQ [place] result.  Final Clinical Impressions(s) / ED Diagnoses   Final diagnoses:  Pain    New Prescriptions New Prescriptions   No medications on file     Isla Pence, MD 01/21/17 1547

## 2017-01-21 NOTE — ED Provider Notes (Signed)
Reevaluation post return of imaging.  CT angiogram chest and ultrasound abdomen, without acute abnormalities.  At this time patient complains of pain in her right lower anterior chest wall, and when palpated this area is tender.  There is no associated crepitation or deformity.  Findings discussed with the patient and all questions answered.  Evaluation is consistent with musculoskeletal discomfort.  Doubt radicular pain, acute intra-abdominal process, PE, ACS or pneumonia.  Plan-symptomatic care.  Short duration prescription narcotic pain medication.  Recommend OTC pain relievers such as Tylenol or ibuprofen.  PCP follow-up as needed.   Daleen Bo, MD 01/21/17 1944

## 2017-01-29 ENCOUNTER — Encounter: Payer: Self-pay | Admitting: Neurology

## 2017-01-29 ENCOUNTER — Ambulatory Visit (INDEPENDENT_AMBULATORY_CARE_PROVIDER_SITE_OTHER): Payer: Medicaid Other | Admitting: Neurology

## 2017-01-29 ENCOUNTER — Ambulatory Visit (INDEPENDENT_AMBULATORY_CARE_PROVIDER_SITE_OTHER): Payer: Medicaid Other | Admitting: Obstetrics & Gynecology

## 2017-01-29 ENCOUNTER — Encounter (INDEPENDENT_AMBULATORY_CARE_PROVIDER_SITE_OTHER): Payer: Self-pay

## 2017-01-29 VITALS — BP 121/80 | HR 63 | Ht 65.0 in | Wt 173.0 lb

## 2017-01-29 VITALS — BP 138/95 | HR 84 | Ht 65.0 in | Wt 172.0 lb

## 2017-01-29 DIAGNOSIS — M797 Fibromyalgia: Secondary | ICD-10-CM | POA: Diagnosis not present

## 2017-01-29 DIAGNOSIS — G44209 Tension-type headache, unspecified, not intractable: Secondary | ICD-10-CM

## 2017-01-29 DIAGNOSIS — Z9889 Other specified postprocedural states: Secondary | ICD-10-CM

## 2017-01-29 MED ORDER — DULOXETINE HCL 30 MG PO CPEP
30.0000 mg | ORAL_CAPSULE | Freq: Every day | ORAL | 3 refills | Status: DC
Start: 2017-01-29 — End: 2017-07-17

## 2017-01-29 NOTE — Progress Notes (Signed)
Reason for visit: Headache, fibromyalgia  Katherine [name] is an 46 y.o. female  History of present illness:  Katherine [name] is a 47 year old right-handed black female with a history of migraine daily headaches and fibromyalgia. The patient continues to have daily headaches but she believes that the headaches are somewhat improved. The patient had been on gabapentin, but she stopped the medication secondary to side effects which included nausea. The patient gets a bandlike sensation around the head and she bends over, she denies any significant nausea or vomiting. The patient sleeps fairly well usually. She continues report some memory issues off and on which she has had over the last 2 years. She has completed a neuropsychological evaluation but she has not returned for the test results. The patient returns to this office for an evaluation. She was recently hospitalized for hysterectomy and she went to the emergency room on 01/21/2017 with right-sided chest pain that was noncardiac.   Past Medical History:  Diagnosis Date  . Anxiety   . Asthma   . Dysrhythmia   . Fibromyalgia   . GERD (gastroesophageal reflux disease)   . Hot flashes   . Migraine   . Palpitations   . Pneumonia    with cavitation of left lower lobe  . Tuberculosis    exposure as a child, get checked frequently  . Uterine fibroid     Past Surgical History:  Procedure Laterality Date  . TUBAL LIGATION  1993  . VAGINAL HYSTERECTOMY Bilateral 12/19/2016   Procedure: HYSTERECTOMY VAGINAL;  Surgeon: Lavonia Drafts, MD;  Location: [facility];  Service: Gynecology;  Laterality: Bilateral;  . WISDOM TOOTH EXTRACTION      Family History  Problem Relation Age of Onset  . Migraines Daughter   . Hypertension Mother   . Heart disease Mother   . Hypertension Father   . Heart disease Father   . Breast cancer Sister 20    deceased at age 66  . Migraines Son   . Hypertension    . Colon cancer Neg Hx   . Colon polyps Neg Hx     . Diabetes Neg Hx   . Kidney disease Neg Hx   . Liver disease Neg Hx   . Heart attack Neg Hx   . Stroke Neg Hx     Social history:  reports that she has been smoking Cigarettes.  She has been smoking about 0.05 packs per day. She has never used smokeless tobacco. She reports that she does not drink alcohol or use drugs.    Allergies  Allergen Reactions  . Morphine Anaphylaxis, Hives and Swelling  . Shellfish Allergy Anaphylaxis  . Penicillins Other (See Comments)    Yeast infection Has patient had a PCN reaction causing immediate rash, facial/tongue/throat swelling, SOB or lightheadedness with hypotension: no Has patient had a PCN reaction causing severe rash involving mucus membranes or skin necrosis: no Has patient had a PCN reaction that required hospitalization no Has patient had a PCN reaction occurring within the last 10 years:no If all of the above answers are "NO", then may proceed with Cephalosporin use.   . Codeine Hives  . Hydrocodone Hives    Pt can take percocet   . Tomato Rash    Medications:  Prior to Admission medications   Medication Sig Start Date End Date Taking? Authorizing Provider  albuterol (PROVENTIL HFA;VENTOLIN HFA) 108 (90 Base) MCG/ACT inhaler Inhale 2 puffs into the lungs every 6 (six) hours as needed for wheezing  or shortness of breath.   Yes [provider]  diltiazem (CARDIZEM CD) 240 MG 24 hr capsule Take 240 mg by mouth daily.  06/28/16  Yes [provider]  docusate sodium (COLACE) 100 MG capsule Take 1 capsule (100 mg total) by mouth 2 (two) times daily. 12/20/16  Yes Lavonia Drafts, MD  ibuprofen (ADVIL,MOTRIN) 600 MG tablet Take 1 tablet (600 mg total) by mouth every 6 (six) hours as needed (mild pain). 12/20/16  Yes Lavonia Drafts, MD  labetalol (NORMODYNE) 100 MG tablet Take 1 tablet (100 mg total) by mouth 2 (two) times daily. Patient taking differently: Take 100 mg by mouth daily.  10/14/15  Yes Fay Records, MD  meloxicam (MOBIC) 15 MG tablet Take 15 mg by mouth daily. 12/06/16  Yes [provider]  Multiple Vitamins-Minerals (HAIR SKIN AND NAILS FORMULA PO) Take 1 tablet by mouth daily.   Yes [provider]  omeprazole (PRILOSEC) 20 MG capsule Take 1 capsule (20 mg total) by mouth 2 (two) times daily before a meal. 08/09/15  Yes Funches, Josalyn, MD  oxyCODONE-acetaminophen (PERCOCET/ROXICET) 5-325 MG tablet Take 1-2 tablets by mouth every 4 (four) hours as needed (moderate to severe pain (when tolerating fluids)). 12/20/16  Yes Lavonia Drafts, MD  SUMAtriptan (IMITREX) 50 MG tablet Take 50 mg by mouth daily as needed for migraine. 11/08/16  Yes [provider]  traMADol (ULTRAM) 50 MG tablet Take 1 tablet (50 mg total) by mouth every 6 (six) hours as needed. 01/21/17  Yes Daleen Bo, MD    ROS:  Out of a complete 14 system review of symptoms, the patient complains only of the following symptoms, and all other reviewed systems are negative.  Eye itching Back pain, muscle cramps Food allergies Itching Headache  Blood pressure 121/80, pulse 63, height 5\' 5"  (1.651 m), weight 173 lb (78.5 kg), last menstrual period 11/13/2016, SpO2 97 %.  Physical Exam  General: The patient is alert and cooperative at the time of the examination.  Skin: No significant peripheral edema is noted.   Neurologic Exam  Mental status: The patient is alert and oriented x 3 at the time of the examination. The patient has apparent normal recent and remote memory, with an apparently normal attention span and concentration ability.   Cranial nerves: Facial symmetry is present. Speech is normal, no aphasia or dysarthria is noted. Extraocular movements are full. Visual fields are full.  Motor: The patient has good strength in all 4 extremities.  Sensory examination: Soft touch sensation is symmetric on the face, arms, and legs.  Coordination: The patient has good  finger-nose-finger and heel-to-shin bilaterally.  Gait and station: The patient has a normal gait. Tandem gait is normal. Romberg is negative. No drift is seen.  Reflexes: Deep tendon reflexes are symmetric.   Assessment/Plan:  1. Fibromyalgia  2. Chronic daily headache  3. Reported memory disturbance  The patient will be placed on Cymbalta in low dose. She is to contact our office for any dose adjustments of the medication. If the neuropsychological evaluation appears to show evidence of an organic memory disorder, we will obtain MRI evaluation of the brain. The patient otherwise will follow-up in 6 months.  Jill Alexanders MD 01/29/2017 10:22 AM  [facility] [address] [place], [place] [address]  Phone [phone] Fax [phone]

## 2017-01-29 NOTE — Progress Notes (Addendum)
History:  46 y.o. [id number] here today for her post op care. Pt is s/p [facility] 12/19/2016. She forgot about her 2 week post op visit. Her husband of 25 years, Belenda Cruise, was recently diagnosed with prostrate cancer.  They have not decided what his treatment will be.  The following portions of the patient's history were reviewed and updated as appropriate: allergies, current medications, past family history, past medical history, past social history, past surgical history and problem list.  Review of Systems:  Pertinent items are noted in HPI.   Objective:  Physical Exam Blood pressure (!) 138/95, pulse 84, height 5\' 5"  (1.651 m), weight 172 lb (78 kg), last menstrual period 11/13/2016. Gen: NAD Abd: Soft, nontender and nondistended Pelvic: Normal appearing external genitalia; normal appearing vaginal mucosa and cervix.  Normal discharge.  Small uterus, no other palpable masses, no uterine or adnexal tenderness  Labs and Imaging Dg Chest 2 View  Result Date: 01/21/2017 CLINICAL DATA:  Chest pain EXAM: CHEST  2 VIEW COMPARISON:  September 23, 2016 FINDINGS: Lungs are clear. Heart size and pulmonary vascularity are normal. No adenopathy. No bone lesions. No pneumothorax. IMPRESSION: No edema or consolidation. Electronically Signed   By: Lowella Grip III M.D.   On: 01/21/2017 15:01   Ct Angio Chest Pe W And/or Wo Contrast  Result Date: 01/21/2017 CLINICAL DATA:  Central chest pain. EXAM: CT ANGIOGRAPHY CHEST WITH CONTRAST TECHNIQUE: Multidetector CT imaging of the chest was performed using the standard protocol during bolus administration of intravenous contrast. Multiplanar CT image reconstructions and MIPs were obtained to evaluate the vascular anatomy. CONTRAST:  75 cc Isovue 370 intravenously. COMPARISON:  Chest radiograph 01/22/2007 FINDINGS: Cardiovascular: Satisfactory opacification of the pulmonary arteries to the segmental level. No evidence of pulmonary embolism. Normal heart size. No  pericardial effusion. Mediastinum/Nodes: No enlarged mediastinal, hilar, or axillary lymph nodes. Thyroid gland, trachea, and esophagus demonstrate no significant findings. Lungs/Pleura: Lungs are clear. No pleural effusion or pneumothorax. Upper Abdomen: No acute abnormality. Musculoskeletal: No chest wall abnormality. No acute or significant osseous findings. Review of the MIP images confirms the above findings. IMPRESSION: No evidence of pulmonary embolus or other acute abnormality within the thorax. Electronically Signed   By: Fidela Salisbury M.D.   On: 01/21/2017 18:40   US Abdomen Limited Ruq  Result Date: 01/21/2017 CLINICAL DATA:  Right upper quadrant pain. EXAM: US ABDOMEN LIMITED - RIGHT UPPER QUADRANT COMPARISON:  None. FINDINGS: Gallbladder: No gallstones or wall thickening visualized. No sonographic Murphy sign noted by sonographer. Common bile duct: Diameter: 4.2 mm Liver: No focal lesion identified. Within normal limits in parenchymal echogenicity. IMPRESSION: Normal right upper quadrant ultrasound. Electronically Signed   By: Fidela Salisbury M.D.   On: 01/21/2017 16:50   12/19/2016 Diagnosis Uterus and cervix CERVIX: CHRONIC CERVICITIS ENDOMETRIUM: INACTIVE ENDOMETRIUM MYOMETRIUM: LEIOMYOMAS Assessment & Plan:  6 weeks post op state- doing well Gradual return to full activity  f/u in 3 months or sooner prn  Bernice Mcauliffe L. Harraway-Smith, M.D., Cherlynn June

## 2017-01-29 NOTE — Patient Instructions (Signed)
   We will start low dose Cymbalta for the headache and for the fibromyalgia. Call for any dose adjustments.  Cymbalta (duloxetine) is an antidepressant medication that is commonly used for peripheral neuropathy pain or for fibromyalgia pain. As with any antidepressant medication, worsening depression can be seen. This medication can potentially cause headache, dizziness, sexual dysfunction, or nausea. If any problems are noted on this medication, please contact our office.

## 2017-01-29 NOTE — Patient Instructions (Signed)
Vaginal Hysterectomy, Care After °Refer to this sheet in the next few weeks. These instructions provide you with information about caring for yourself after your procedure. Your health care provider may also give you more specific instructions. Your treatment has been planned according to current medical practices, but problems sometimes occur. Call your health care provider if you have any problems or questions after your procedure. °What can I expect after the procedure? °After the procedure, it is common to have: °· Pain. °· Soreness and numbness in your incision areas. °· Vaginal bleeding and discharge. °· Constipation. °· Temporary problems emptying the bladder. °· Feelings of sadness or other emotions. °Follow these instructions at home: °Medicines  °· Take over-the-counter and prescription medicines only as told by your health care provider. °· If you were prescribed an antibiotic medicine, take it as told by your health care provider. Do not stop taking the antibiotic even if you start to feel better. °· Do not drive or operate heavy machinery while taking prescription pain medicine. °Activity  °· Return to your normal activities as told by your health care provider. Ask your health care provider what activities are safe for you. °· Get regular exercise as told by your health care provider. You may be told to take short walks every day and go farther each time. °· Do not lift anything that is heavier than 10 lb (4.5 kg). °General instructions  ° °· Do not put anything in your vagina for 6 weeks after your surgery or as told by your health care provider. This includes tampons and douches. °· Do not have sex until your health care provider says you can. °· Do not take baths, swim, or use a hot tub until your health care provider approves. °· Drink enough fluid to keep your urine clear or pale yellow. °· Do not drive for 24 hours if you were given a sedative. °· Keep all follow-up visits as told by your health  care provider. This is important. °Contact a health care provider if: °· Your pain medicine is not helping. °· You have a fever. °· You have redness, swelling, or pain at your incision site. °· You have blood, pus, or a bad-smelling discharge from your vagina. °· You continue to have difficulty urinating. °Get help right away if: °· You have severe abdominal or back pain. °· You have heavy bleeding from your vagina. °· You have chest pain or shortness of breath. °This information is not intended to replace advice given to you by your health care provider. Make sure you discuss any questions you have with your health care provider. °Document Released: 01/03/2016 Document Revised: 02/17/2016 Document Reviewed: 09/26/2015 °Elsevier Interactive Patient Education © 2017 Elsevier Inc. ° °

## 2017-02-07 ENCOUNTER — Encounter: Payer: Self-pay | Admitting: Family Medicine

## 2017-03-02 NOTE — Addendum Note (Signed)
Addendum  created 03/02/17 1126 by Lyn Hollingshead, MD   Sign clinical note

## 2017-06-19 ENCOUNTER — Emergency Department (HOSPITAL_COMMUNITY)
Admission: EM | Admit: 2017-06-19 | Discharge: 2017-06-19 | Discharge status: Against Medical Advice | Payer: Medicaid Other | Attending: Emergency Medicine | Admitting: Emergency Medicine

## 2017-06-19 ENCOUNTER — Encounter (HOSPITAL_COMMUNITY): Payer: Self-pay | Admitting: Emergency Medicine

## 2017-06-19 DIAGNOSIS — Z5321 Procedure and treatment not carried out due to patient leaving prior to being seen by health care provider: Secondary | ICD-10-CM | POA: Diagnosis not present

## 2017-06-19 NOTE — ED Triage Notes (Signed)
Patient c/o knot in right breast that she noticed about week to 2 weeks ago. Patient reports knot is painful.

## 2017-06-19 NOTE — ED Notes (Signed)
Patient called for vital sign recheck with no response.

## 2017-06-19 NOTE — ED Notes (Signed)
Pt called to be taken to treatment room with no answer

## 2017-06-20 ENCOUNTER — Other Ambulatory Visit: Payer: Self-pay | Admitting: Specialist

## 2017-06-20 DIAGNOSIS — N63 Unspecified lump in unspecified breast: Secondary | ICD-10-CM

## 2017-06-21 ENCOUNTER — Other Ambulatory Visit: Payer: Self-pay | Admitting: Specialist

## 2017-06-21 ENCOUNTER — Ambulatory Visit
Admission: RE | Admit: 2017-06-21 | Discharge: 2017-06-21 | Disposition: A | Discharge status: Home / Self Care | Payer: Self-pay | Source: Ambulatory Visit | Attending: Specialist | Admitting: Specialist

## 2017-06-21 DIAGNOSIS — N63 Unspecified lump in unspecified breast: Secondary | ICD-10-CM

## 2017-06-25 DIAGNOSIS — C50411 Malignant neoplasm of upper-outer quadrant of right female breast: Secondary | ICD-10-CM

## 2017-06-25 HISTORY — DX: Malignant neoplasm of upper-outer quadrant of right female breast: C50.411

## 2017-06-26 ENCOUNTER — Ambulatory Visit
Admission: RE | Admit: 2017-06-26 | Discharge: 2017-06-26 | Disposition: A | Discharge status: Home / Self Care | Payer: Medicaid Other | Source: Ambulatory Visit | Attending: Specialist | Admitting: Specialist

## 2017-06-26 ENCOUNTER — Other Ambulatory Visit: Payer: Self-pay | Admitting: Specialist

## 2017-06-26 DIAGNOSIS — N63 Unspecified lump in unspecified breast: Secondary | ICD-10-CM

## 2017-06-29 ENCOUNTER — Telehealth: Payer: Self-pay | Admitting: Hematology and Oncology

## 2017-06-29 ENCOUNTER — Encounter: Payer: Self-pay | Admitting: Hematology and Oncology

## 2017-06-29 ENCOUNTER — Ambulatory Visit: Payer: Self-pay | Admitting: Surgery

## 2017-06-29 MED ORDER — DEXTROSE 5 % IV SOLN: 2.0000 g | INTRAVENOUS | Status: AC

## 2017-06-29 NOTE — H&P (Signed)
Katherine [name] 06/29/2017 10:09 AM Location: [facility] Patient #: [id number] DOB: 06-Sep-1971 Married / Language: English / Race: Black or African American Female  History of Present Illness Katherine Moores A. Katherine Burkley MD; 06/29/2017 11:58 AM) Patient words: CLINICAL DATA: 46 year old patient presents for evaluation of a palpable mass in the lower outer quadrant of the right breast. She noticed the mass approximately a week ago, and it is sometimes tender because it is near the peripheral margin of her bra.  EXAM: 2D DIGITAL DIAGNOSTIC RIGHT MAMMOGRAM WITH CAD AND ADJUNCT TOMO  ULTRASOUND RIGHT BREAST  COMPARISON: Screening mammogram August 07, 2016, and earlier priors.  ACR Breast Density Category d: The breast tissue is extremely dense, which lowers the sensitivity of mammography.  FINDINGS: A metallic skin marker was placed over the palpable lump in the lower outer quadrant of the right breast. A mass is seen deep within the right breast parenchyma in the lower outer quadrant, with indistinct margins. No suspicious microcalcification or architectural distortion is identified in the right breast.  Mammographic images were processed with CAD.  On physical exam, there is a firm fairly fixed mass in the 8:30 position of the right breast 8 cm from the nipple that measures approximately 2 cm on physical exam.  Targeted ultrasound is performed, showing a predominantly hypoechoic irregular mass with indistinct margins, particularly along its medial aspect. The mass also has some angulated margins and contains multiple echogenic septations within it. The mass measures approximately 1.9 x 1.1 x 1.4 cm. There are multiple areas of vascular flow within the mass.  Ultrasound of the adjacent breast parenchyma shows a small cystic mass with an internal echogenic septation approximately 2 cm directly medial to the dominant palpable mass, at 8:30 position 6 cm from the nipple. The  smaller mass measures 0.7 x 0.4 x 0.5 cm and does not demonstrate vascular flow. There are mildly dilated ducts traversing the tissue between the two masses.  No axillary adenopathy is identified on the right.  IMPRESSION: 1. Suspicious palpable 1.9 cm mass in the 8:30 position of the right breast. Findings are suspicious for malignancy. 2. 0.7 cm cystic mass approximately 2 cm medial to the dominant palpable mass. Given the suspicious nature of the palpable mass and the proximity of the smaller mass and similar-appearing internal septation, biopsy is suggested to exclude malignancy.  RECOMMENDATION: Ultrasound-guided biopsies of the dominant suspicious palpable mass at 8:30 position 8 cm from the nipple and a second ultrasound-guided biopsy of a 0.7 cm indeterminate cystic mass at 8:30 position 6 cm from the nipple are recommended and are being scheduled for the patient.  I have discussed the findings and recommendations with the patient. Results were also provided in writing at the conclusion of the visit. If applicable, a reminder letter will be sent to the patient regarding the next appointment.  BI-RADS CATEGORY 4: Suspicious.   Electronically Signed By: Katherine [name] M.D. On: 06/21/2017 17:16       Patient's request of Dr. Rosana [name] due to a right breast mass. The patient noted a right lower outer breast past 2 weeks ago. Patient with ultrasound as well as mammography with core biopsy which showed a 1.9 cm mass in the right lower outer quadrant ER negative PR negative HER-2/neu pending. Patient denies any other complaints currently. She does have soreness from the biopsy site.       ADDITIONAL INFORMATION: 1. PROGNOSTIC INDICATORS Results: IMMUNOHISTOCHEMICAL AND MORPHOMETRIC ANALYSIS PERFORMED MANUALLY Estrogen Receptor: 0%, NEGATIVE Progesterone Receptor: 0%,  NEGATIVE Proliferation Marker Ki67: 40% COMMENT: The negative hormone receptor study(ies) in  this case has An internal positive control. REFERENCE RANGE ESTROGEN RECEPTOR NEGATIVE 0% POSITIVE =>1% REFERENCE RANGE PROGESTERONE RECEPTOR NEGATIVE 0% POSITIVE =>1% All controls stained appropriately Katherine Males MD Pathologist, Electronic Signature ( Signed 06/28/2017) FINAL DIAGNOSIS Diagnosis 1. Breast, right, needle core biopsy, 8:30 o'clock, 8cmfn - INVASIVE DUCTAL CARCINOMA. - DUCTAL CARCINOMA IN SITU. - SEE COMMENT. 1 of 3 FINAL for Katherine [name], Katherine [name] ([id number]) Diagnosis(continued) 2. Breast, right, needle core biopsy, 8:30 o'clock, 6cmfn - FIBROCYSTIC CHANGES. - THERE IS NO EVIDENCE OF MALIGNANCY Microscopic Comment 1. The carcinoma is grade III. A breast prognostic profile will be performed and the results reported separately. The results were called to the [facility] on 06/27/2017. Katherine Cutter MD Pathologist, Electronic Signature (Case signed 06/27/2017) Specimen Gross and Clinical Information Specimen Comment 1. TIF: 3:40 PM; extracted < 30 sec; masses 2. TIF: 3:45 PM; extracted < 30 sec; masses Specimen(s) Obtained: 1. Breast, right, needle core biopsy, 8:30 o'clock, 8cmfn 2. Breast, right, needle core biopsy, 8:30 o'clock, 6cmfn Specimen Clinical Information 1. Poss FA vs IDC 2. Prob FCC Gross 1. Received labeled "Katherine [name]" and "Rt breast 830 8cm" (TIF 1540 CIT <30 secs) are 3 cores of gray white soft to firm tissue, ranging from 1.1 x 0.2 x 0.15 cm to 1.5 x 0.2 x 0.2 cm. One block submitted. (SSW 10/2) 2. Received labeled "Katherine [name]" and "Rt breast 830 6cm" (TIF 1545 CIT <30 secs) are 2 cores of gray white soft tissue, which average 1.2 x 0.15 x 0.15 cm. One block submitted. (SSW 10/2) Stain(s) used in Diagnosis: The following stain(s) were used in diagnosing the case: Her2 FISH, ER-ACIS, PR-ACIS, KI-67-ACIS. The control(s) stained appropriately. Disclaimer Estrogen receptor (6F11), immunohistochemical stains are performed on formalin  fixed, paraffin embedded tissue using a 3,3"-diaminobenzidine (DAB) chromogen and Leica Bond Autostainer System. The staining intensity of the nucleus is scored manually and is reported as the percentage of tumor cell nuclei demonstrating specific nuclear staining.Specimens are fixed in 10% Neutral Buffered Formalin for at least 6 hours and up to 72 hours. These tests have not be validated on decalcified tissue. Results should be interpreted with caution given the possibility of false negative results on decalcified specimens. HER2 IQFISH pharmDX (code [id number]) is a direct fluorescence in-situ hybridization assay designed to quantitatively determine HER2 gene amplification in formalin-fixed, paraffin-embedded tissue specimens. It is performed at [facility] and is reported using ASCO/CAP scoring criteria published in 2013. Ki-67 (MM1), immunohistochemical stains are performed on formalin fixed, paraffin embedded tissue using a 3,3"-diaminobenzidine (DAB) chromogen and Leica Bond Autostainer System. The staining intensity of the nucleus is scored manually and is reported as the percentage of tumor cell nuclei demonstrating specific nuclear staining.Specimens are fixed in 10% Neutral Buffered Formalin for at least 6 hours and up to 72 hours. These tests have not be validated on decalcified tissue. Results should be interpreted with caution given the possibility of false negative results on decalcified specimens. PR progesterone receptor (16), immunohistochemical stains are performed on formalin fixed, paraffin embedded tissue using a 3,3"-diaminobenzidine (DAB) chromogen and Leica Bond Autostainer System. The staining intensity of the nucleus is scored manually and is reported as the percentage of tumor cell nuclei demonstrating specific nuclear staining.Specimens are fixed in 10% Neutral Buffered Formalin for at least 6 hours and up to 72 hours. These tests have not be validated on decalcified  tissue. Results should be interpreted with caution given the  possibility of false negative results on decalcified specimens. 2 of 3 FINAL for Rossy, Clarabel [name] ([id number]) Report signed out from the following location(s) Technical Component was performed at [facility]. [address],[place] [address].[id number]., Technical Component was performed at [facility], [place], [place] [address]. CLIA #: [id number], Interpretation was performed at [facility], [place], [place] [address]. CLIA #: [id number], 3 of.  The patient is a 46 year old female.   Past Surgical History (Tanisha A. Owens Shark, [place]; 06/29/2017 10:09 AM) No pertinent past surgical history  Diagnostic Studies History (Tanisha A. Owens Shark, [place]; 06/29/2017 10:09 AM) Colonoscopy never Mammogram within last year Pap Smear 1-5 years ago  Allergies (Tanisha A. Owens Shark, [place]; 06/29/2017 10:11 AM) SHELLFISH Swelling. Allergies Reconciled  Medication History (Tanisha A. Owens Shark, [place]; 06/29/2017 10:11 AM) Amoxicillin (875MG Tablet, Oral) Active. Meloxicam (15MG Tablet, Oral) Active. Pantoprazole Sodium (40MG Tablet DR, Oral) Active. TiZANidine HCl (4MG Tablet, Oral) Active. Proventil HFA (108 (90 Base)MCG/ACT Aerosol Soln, Inhalation) Active. Medications Reconciled  Social History (Tanisha A. Owens Shark, [place]; 06/29/2017 10:09 AM) No alcohol use No caffeine use No drug use Tobacco use Current some day smoker.  Family History (Tanisha A. Owens Shark, [place]; 06/29/2017 10:09 AM) Alcohol Abuse Father, Mother. Heart Disease Mother. Heart disease in female family member before age 62 Hypertension Father, Mother.  Pregnancy / Birth History (Tanisha A. Owens Shark, [place]; 06/29/2017 10:09 AM) Durenda Age 1 Irregular periods Maternal age 27-25 Para 2  Other Problems (Tanisha A. Brown, RMA; 06/29/2017 10:09 AM) Anxiety  Disorder Asthma Gastroesophageal Reflux Disease Heart murmur Migraine Headache     Review of Systems (Tanisha A. Brown RMA; 06/29/2017 10:09 AM) General Not Present- Appetite Loss, Chills, Fatigue, Fever, Night Sweats, Weight Gain and Weight Loss. Skin Not Present- Change in Wart/Mole, Dryness, Hives, Jaundice, New Lesions, Non-Healing Wounds, Rash and Ulcer. HEENT Present- Ringing in the Ears, Seasonal Allergies and Wears glasses/contact lenses. Not Present- Earache, Hearing Loss, Hoarseness, Nose Bleed, Oral Ulcers, Sinus Pain, Sore Throat, Visual Disturbances and Yellow Eyes. Respiratory Not Present- Bloody sputum, Chronic Cough, Difficulty Breathing, Snoring and Wheezing. Breast Present- Breast Mass and Breast Pain. Not Present- Nipple Discharge and Skin Changes. Cardiovascular Present- Rapid Heart Rate. Not Present- Chest Pain, Difficulty Breathing Lying Down, Leg Cramps, Palpitations, Shortness of Breath and Swelling of Extremities. Gastrointestinal Not Present- Abdominal Pain, Bloating, Bloody Stool, Change in Bowel Habits, Chronic diarrhea, Constipation, Difficulty Swallowing, Excessive gas, Gets full quickly at meals, Hemorrhoids, Indigestion, Nausea, Rectal Pain and Vomiting. Female Genitourinary Not Present- Frequency, Nocturia, Painful Urination, Pelvic Pain and Urgency. Musculoskeletal Present- Back Pain, Joint Pain and Muscle Pain. Not Present- Joint Stiffness, Muscle Weakness and Swelling of Extremities. Neurological Present- Headaches. Not Present- Decreased Memory, Fainting, Numbness, Seizures, Tingling, Tremor, Trouble walking and Weakness. Psychiatric Present- Anxiety. Not Present- Bipolar, Change in Sleep Pattern, Depression, Fearful and Frequent crying. Endocrine Present- Hot flashes. Not Present- Cold Intolerance, Excessive Hunger, Hair Changes, Heat Intolerance and New Diabetes. Hematology Not Present- Blood Thinners, Easy Bruising, Excessive bleeding, Gland problems,  HIV and Persistent Infections.  Vitals (Tanisha A. Brown RMA; 06/29/2017 10:10 AM) 06/29/2017 10:10 AM Weight: 161.4 lb Height: 65in Body Surface Area: 1.81 m Body Mass Index: 26.86 kg/m  Temp.: 97.67F  Pulse: 96 (Regular)  BP: 124/82 (Sitting, Left Arm, Standard)      Physical Exam (Demaryius Imran A. Jourdin Gens MD; 06/29/2017 11:59 AM)  General Mental Status-Alert. General Appearance-Consistent with stated age. Hydration-Well hydrated. Voice-Normal.  Head and Neck  Head-normocephalic, atraumatic with no lesions or palpable masses. Trachea-midline. Thyroid Gland Characteristics - normal size and consistency.  Chest and Lung Exam Chest and lung exam reveals -quiet, even and easy respiratory effort with no use of accessory muscles and on auscultation, normal breath sounds, no adventitious sounds and normal vocal resonance. Inspection Chest Wall - Normal. Back - normal.  Breast Note: Bruising right lower breast. 2 cm mobile mass right lower breast. Right axilla was normal. Left breast is normal.  Cardiovascular Cardiovascular examination reveals -normal heart sounds, regular rate and rhythm with no murmurs and normal pedal pulses bilaterally.  Neurologic Neurologic evaluation reveals -alert and oriented x 3 with no impairment of recent or remote memory. Mental Status-Normal.  Musculoskeletal Normal Exam - Left-Upper Extremity Strength Normal and Lower Extremity Strength Normal. Normal Exam - Right-Upper Extremity Strength Normal and Lower Extremity Strength Normal.  Lymphatic Head & Neck  General Head & Neck Lymphatics: Bilateral - Description - Normal. Axillary  General Axillary Region: Bilateral - Description - Normal. Tenderness - Non Tender.    Assessment & Plan (Kaliegh Willadsen A. 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 I think she will benefit from neoadjuvant chemotherapy and I  will refer her to medical oncology. I discussed port placement. I think she would be a good breast conserving candidate at some point as well. Pt requires port placement for chemotherapy. Risk include bleeding, infection, pneumothorax, hemothorax, mediastinal injury, nerve injury , blood vessel injury, strke, blood clots, death, migration. embolization and need for additional procedures. Pt agrees to proceed.  Current Plans You are being scheduled for surgery- Our schedulers will call you.  You should hear from our office's scheduling department within 5 working days about the location, date, and time of surgery. We try to make accommodations for patient's preferences in scheduling surgery, but sometimes the OR schedule or the surgeon's schedule prevents [place] from making those accommodations.  If you have not heard from our office [phone]) in 5 working days, call the office and ask for your surgeon's nurse.  If you have other questions about your diagnosis, plan, or surgery, call the office and ask for your surgeon's nurse.  Use of a central venous catheter for intravenous therapy was discussed. Technique of catheter placement using ultrasound and fluoroscopy guidance was discussed. Risks such as bleeding, infection, pneumothorax, catheter occlusion, reoperation, and other risks were discussed. I noted a good likelihood this will help address the problem. Questions were answered. The patient expressed understanding & wishes to proceed. Pt Education - [employer] Free Text Education/Instructions: discussed with patient and provided information.

## 2017-06-29 NOTE — Telephone Encounter (Signed)
Pt has been scheduled to see Dr. Lindi Adie on 10/9 at 345pm. Notified Michelle from [facility] to call the pt. Letter mailed.

## 2017-07-03 ENCOUNTER — Encounter: Payer: Self-pay | Admitting: *Deleted

## 2017-07-03 ENCOUNTER — Encounter: Payer: Self-pay | Admitting: Genetics

## 2017-07-03 ENCOUNTER — Ambulatory Visit (HOSPITAL_BASED_OUTPATIENT_CLINIC_OR_DEPARTMENT_OTHER): Payer: Medicaid Other | Admitting: Hematology and Oncology

## 2017-07-03 DIAGNOSIS — Z171 Estrogen receptor negative status [ER-]: Secondary | ICD-10-CM | POA: Diagnosis not present

## 2017-07-03 DIAGNOSIS — C50411 Malignant neoplasm of upper-outer quadrant of right female breast: Secondary | ICD-10-CM

## 2017-07-03 NOTE — Progress Notes (Signed)
[name] CONSULT NOTE  Patient Care Team: [name], MD as PCP - General (Family Medicine)  CHIEF COMPLAINTS/PURPOSE OF CONSULTATION:  Newly diagnosed breast cancer  HISTORY OF PRESENTING ILLNESS:  Katherine [name] 46 y.o. female is here because of recent diagnosis of right breast cancer. Patient was doing a self breast examination and felt a nodule in the right breast. She immediately brought to the attention of her physicians to obtain a mammogram and ultrasound. She was noted to have a 1.9 cm mass at 8:30 position and an adjacent 0.7 cm cystic mass. Biopsy of the mass and the cystic lesion was performed. 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 Ki-67 was 40%. She saw Dr. Brantley Stage who recommended surgery. She was sent was for discussion regarding adjuvant treatment options. She tells me that she is on disability.  I reviewed her records extensively and collaborated the history with the patient.  SUMMARY OF ONCOLOGIC HISTORY:   Malignant neoplasm of upper-outer quadrant of right breast in female, estrogen receptor negative ([place])   06/26/2017 Initial Diagnosis    Right breast biopsy 8:30 position 8 cm from nipple: IDC with DCIS, second biopsy 6 cm from nipple fibrocystic changes: Grade 3, ER 0%, PR 0%, Ki-67 40%, HER-2 positive ratio 3.59; mammogram and ultrasound revealed 1.9 cm mass at 8:30 position, adjacent 0.7 cm cystic mass, T1c N0 stage IA AJCC 8        MEDICAL HISTORY:  Past Medical History:  Diagnosis Date  . Anxiety   . Asthma   . Dysrhythmia   . Fibromyalgia   . GERD (gastroesophageal reflux disease)   . Hot flashes   . Migraine   . Palpitations   . Pneumonia    with cavitation of left lower lobe  . Tuberculosis    exposure as a child, get checked frequently  . Uterine fibroid     SURGICAL HISTORY: Past Surgical History:  Procedure Laterality Date  . TUBAL LIGATION  1993  .  VAGINAL HYSTERECTOMY Bilateral 12/19/2016   Procedure: HYSTERECTOMY VAGINAL;  Surgeon: Lavonia Drafts, MD;  Location: [facility];  Service: Gynecology;  Laterality: Bilateral;  . WISDOM TOOTH EXTRACTION      SOCIAL HISTORY: Social History   Social History  . Marital status: Married    Spouse name: N/A  . Number of children: 2  . Years of education: 15   Occupational History  . unemployed    Social History Main Topics  . Smoking status: Current Some Day Smoker    Packs/day: 0.05    Types: Cigarettes  . Smokeless tobacco: Never Used     Comment: smokes 2 cigarettes per day and more if she is stressed  . Alcohol use No  . Drug use: No     Comment: occasional, for pain  . Sexual activity: Yes    Birth control/ protection: Surgical   Other Topics Concern  . Not on file   Social History Narrative   On disability for heart condition.  Pt. Was non-specific.       Patient does not drink caffeine.   Patient is ambidextrous, but mostly right handed.     FAMILY HISTORY: Family History  Problem Relation Age of Onset  . Migraines Daughter   . Hypertension Mother   . Heart disease Mother   . Hypertension Father   . Heart disease Father   . Breast cancer Sister 35  deceased at age 73  . Migraines Son   . Hypertension Unknown   . Colon cancer Neg Hx   . Colon polyps Neg Hx   . Diabetes Neg Hx   . Kidney disease Neg Hx   . Liver disease Neg Hx   . Heart attack Neg Hx   . Stroke Neg Hx     ALLERGIES:  is allergic to morphine; shellfish allergy; penicillins; codeine; hydrocodone; and tomato.  MEDICATIONS:  Current Outpatient Prescriptions  Medication Sig Dispense Refill  . albuterol (PROVENTIL HFA;VENTOLIN HFA) 108 (90 Base) MCG/ACT inhaler Inhale 2 puffs into the lungs every 6 (six) hours as needed for wheezing or shortness of breath.    . diltiazem (CARDIZEM CD) 240 MG 24 hr capsule Take 240 mg by mouth daily.   5  . docusate sodium (COLACE) 100 MG capsule  Take 1 capsule (100 mg total) by mouth 2 (two) times daily. (Patient not taking: Reported on 01/29/2017) 10 capsule 0  . DULoxetine (CYMBALTA) 30 MG capsule Take 1 capsule (30 mg total) by mouth daily. (Patient not taking: Reported on 01/29/2017) 30 capsule 3  . ibuprofen (ADVIL,MOTRIN) 600 MG tablet Take 1 tablet (600 mg total) by mouth every 6 (six) hours as needed (mild pain). (Patient not taking: Reported on 01/29/2017) 30 tablet 0  . labetalol (NORMODYNE) 100 MG tablet Take 1 tablet (100 mg total) by mouth 2 (two) times daily. (Patient not taking: Reported on 01/29/2017) 60 tablet 6  . meloxicam (MOBIC) 15 MG tablet Take 15 mg by mouth daily.  3  . Multiple Vitamins-Minerals (HAIR SKIN AND NAILS FORMULA PO) Take 1 tablet by mouth daily.    Marland Kitchen omeprazole (PRILOSEC) 20 MG capsule Take 1 capsule (20 mg total) by mouth 2 (two) times daily before a meal. 60 capsule 3  . oxyCODONE-acetaminophen (PERCOCET/ROXICET) 5-325 MG tablet Take 1-2 tablets by mouth every 4 (four) hours as needed (moderate to severe pain (when tolerating fluids)). (Patient not taking: Reported on 01/29/2017) 30 tablet 0  . SUMAtriptan (IMITREX) 50 MG tablet Take 50 mg by mouth daily as needed for migraine.  3   No current facility-administered medications for this visit.     REVIEW OF SYSTEMS:   Constitutional: Denies fevers, chills or abnormal night sweats Eyes: Denies blurriness of vision, double vision or watery eyes Ears, nose, mouth, throat, and face: Denies mucositis or sore throat Respiratory: Denies cough, dyspnea or wheezes Cardiovascular: Denies palpitation, chest discomfort or lower extremity swelling Gastrointestinal:  Denies nausea, heartburn or change in bowel habits Skin: Denies abnormal skin rashes Lymphatics: Denies new lymphadenopathy or easy bruising Neurological:Denies numbness, tingling or new weaknesses Behavioral/Psych: Mood is stable, no new changes  Breast: Palpable lump in the right breast All other  systems were reviewed with the patient and are negative.  PHYSICAL EXAMINATION: ECOG PERFORMANCE STATUS: 1 - Symptomatic but completely ambulatory  Vitals:   07/03/17 1609  BP: 140/90  Pulse: 60  Resp: 18  Temp: 98.4 F (36.9 C)  SpO2: 100%   Filed Weights   07/03/17 1609  Weight: 160 lb 1.6 oz (72.6 kg)    GENERAL:alert, no distress and comfortable SKIN: skin color, texture, turgor are normal, no rashes or significant lesions EYES: normal, conjunctiva are pink and non-injected, sclera clear OROPHARYNX:no exudate, no erythema and lips, buccal mucosa, and tongue normal  NECK: supple, thyroid normal size, non-tender, without nodularity LYMPH:  no palpable lymphadenopathy in the cervical, axillary or inguinal LUNGS: clear to auscultation and percussion with  normal breathing effort HEART: regular rate & rhythm and no murmurs and no lower extremity edema ABDOMEN:abdomen soft, non-tender and normal bowel sounds Musculoskeletal:no cyanosis of digits and no clubbing  PSYCH: alert & oriented x 3 with fluent speech NEURO: no focal motor/sensory deficits BREAST: Right breast lump is palpated. No palpable axillary or supraclavicular lymphadenopathy (exam performed in the presence of a chaperone)   LABORATORY DATA:  I have reviewed the data as listed Lab Results  Component Value Date   WBC 8.8 01/21/2017   HGB 12.2 01/21/2017   HCT 36.8 01/21/2017   MCV 82.1 01/21/2017   PLT 272 01/21/2017   Lab Results  Component Value Date   NA 137 01/21/2017   K 3.8 01/21/2017   CL 106 01/21/2017   CO2 21 (L) 01/21/2017    RADIOGRAPHIC STUDIES: I have personally reviewed the radiological reports and agreed with the findings in the report.  ASSESSMENT AND PLAN:  Malignant neoplasm of upper-outer quadrant of right breast in female, estrogen receptor negative ([place]) 06/26/2017: Right breast biopsy 8:30 position 8 cm from nipple: IDC with DCIS, second biopsy 6 cm from nipple fibrocystic  changes: Grade 3, ER 0%, PR 0%, Ki-67 40%, HER-2 positive ratio 3.59; mammogram and ultrasound revealed 1.9 cm mass at 8:30 position, adjacent 0.7 cm cystic mass, T1c N0 stage IA AJCC 8  Pathology and radiology counseling: Discussed with the patient, the details of pathology including the type of breast cancer,the clinical staging, the significance of ER, PR and HER-2/neu receptors and the implications for treatment. After reviewing the pathology in detail, we proceeded to discuss the different treatment options between surgery, radiation, chemotherapy, antiestrogen therapies.  Recommendation: 1. Breast conserving surgery with sentinel lymph node biopsy 2. Adjuvant chemotherapy with TCH followed by Herceptin maintenance for 1 yr 3. Adjuvant radiation  I discussed with the patient that she will need a port placement echocardiogram and chemotherapy class after her surgery. The final treatment adjuvant plan will be determined after her surgery depending on the final tumor characteristics. If she has noted involvement then we will have to use Perjeta. If it is much smaller than we can to Taxol Herceptin as well.  All questions were answered. The patient knows to call the clinic with any problems, questions or concerns.    Rulon Eisenmenger, MD 07/03/17

## 2017-07-03 NOTE — Assessment & Plan Note (Signed)
06/26/2017: Right breast biopsy 8:30 position 8 cm from nipple: IDC with DCIS, second biopsy 6 cm from nipple fibrocystic changes: Grade 3, ER 0%, PR 0%, Ki-67 40%, HER-2 positive ratio 3.59; mammogram and ultrasound revealed 1.9 cm mass at 8:30 position, adjacent 0.7 cm cystic mass, T1c N0 stage IA AJCC 8  Pathology and radiology counseling: Discussed with the patient, the details of pathology including the type of breast cancer,the clinical staging, the significance of ER, PR and HER-2/neu receptors and the implications for treatment. After reviewing the pathology in detail, we proceeded to discuss the different treatment options between surgery, radiation, chemotherapy, antiestrogen therapies.  Recommendation: 1. Neoadjuvant chemotherapy with TCH followed by Herceptin maintenance for 1 yr 2. breast conserving surgery with sentinel lymph node biopsy 3. Adjuvant radiation

## 2017-07-04 ENCOUNTER — Telehealth: Payer: Self-pay | Admitting: Hematology and Oncology

## 2017-07-04 ENCOUNTER — Encounter: Payer: Self-pay | Admitting: Radiation Oncology

## 2017-07-04 NOTE — Telephone Encounter (Signed)
No 10/9 los. °

## 2017-07-05 ENCOUNTER — Other Ambulatory Visit: Payer: Self-pay | Admitting: *Deleted

## 2017-07-05 ENCOUNTER — Telehealth: Payer: Self-pay | Admitting: *Deleted

## 2017-07-05 DIAGNOSIS — Z171 Estrogen receptor negative status [ER-]: Principal | ICD-10-CM

## 2017-07-05 DIAGNOSIS — C50411 Malignant neoplasm of upper-outer quadrant of right female breast: Secondary | ICD-10-CM

## 2017-07-05 NOTE — Progress Notes (Signed)
Location of Breast Cancer: Right Breast  Histology per Pathology Report:  06/26/17 Diagnosis 1. Breast, right, needle core biopsy, 8:30 o'clock, 8cmfn - INVASIVE DUCTAL CARCINOMA. - DUCTAL CARCINOMA IN SITU. - SEE COMMENT. 2. Breast, right, needle core biopsy, 8:30 o'clock, 6cmfn - FIBROCYSTIC CHANGES. - THERE IS NO EVIDENCE OF MALIGNANCY  Receptor Status: ER(NEG), PR (NEG), Her2-neu (POS), Ki-(40%)  Did patient present with symptoms or was this found on screening mammography?: She self palpated the mass and brought to the attention of her physician.   Past/Anticipated interventions by surgeon, if any: Dr. Donella Stade. She is being scheduled for surgery.   Past/Anticipated interventions by medical oncology, if any:  Dr. Lindi Adie Recommendation: 1. Breast conserving surgery with sentinel lymph node biopsy 2. Adjuvant chemotherapy with TCH followed by Herceptin maintenance for 1 yr 3. Adjuvant radiation   Lymphedema issues, if any:  N/A  Pain issues, if any: She has pain in her bilateral hands related to arthritis. She rates it a 5/10   SAFETY ISSUES:  Prior radiation? No  Pacemaker/ICD? No  Possible current pregnancy? No, hysterectomy 12/19/16  Is the patient on methotrexate? No  Current Complaints / other details:    BP 111/77   Pulse 69   Temp 98.8 F (37.1 C)   Wt 159 lb 12.8 oz (72.5 kg)   LMP 11/13/2016   SpO2 100% Comment: room air  BMI 26.59 kg/m    Wt Readings from Last 3 Encounters:  07/13/17 159 lb 12.8 oz (72.5 kg)  07/03/17 160 lb 1.6 oz (72.6 kg)  06/19/17 163 lb (73.9 kg)      Vivek Grealish, Stephani Police, RN 07/05/2017,9:03 AM

## 2017-07-05 NOTE — Telephone Encounter (Signed)
Spoke with patient and it was decided for her to have surgery 1st then chemo.  Informed her I would let Dr. Josetta Huddle office know and they will get her scheduled for surgery and port.  Also informed her she would need an echo and someone will call her with that appointment.  Encouraged her to call with any questions.

## 2017-07-09 ENCOUNTER — Ambulatory Visit: Payer: Self-pay | Admitting: Surgery

## 2017-07-09 DIAGNOSIS — Z170 Estrogen receptor positive status [ER+]: Principal | ICD-10-CM

## 2017-07-09 DIAGNOSIS — C50511 Malignant neoplasm of lower-outer quadrant of right female breast: Secondary | ICD-10-CM

## 2017-07-09 NOTE — H&P (Signed)
Katherine [name] 06/29/2017 10:09 AM Location: [facility] Patient #: [id number] DOB: 30-Jan-1971 Married / Language: English / Race: Black or African American Female  History of Present Illness [name] Arianis Bowditch MD; 06/29/2017 11:58 AM) Patient words: CLINICAL DATA: 46 year old patient presents for evaluation of a palpable mass in the lower outer quadrant of the right breast. She noticed the mass approximately a week ago, and it is sometimes tender because it is near the peripheral margin of her bra.  EXAM: 2D DIGITAL DIAGNOSTIC RIGHT MAMMOGRAM WITH CAD AND ADJUNCT TOMO  ULTRASOUND RIGHT BREAST  COMPARISON: Screening mammogram August 07, 2016, and earlier priors.  ACR Breast Density Category d: The breast tissue is extremely dense, which lowers the sensitivity of mammography.  FINDINGS: A metallic skin marker was placed over the palpable lump in the lower outer quadrant of the right breast. A mass is seen deep within the right breast parenchyma in the lower outer quadrant, with indistinct margins. No suspicious microcalcification or architectural distortion is identified in the right breast.  Mammographic images were processed with CAD.  On physical exam, there is a firm fairly fixed mass in the 8:30 position of the right breast 8 cm from the nipple that measures approximately 2 cm on physical exam.  Targeted ultrasound is performed, showing a predominantly hypoechoic irregular mass with indistinct margins, particularly along its medial aspect. The mass also has some angulated margins and contains multiple echogenic septations within it. The mass measures approximately 1.9 x 1.1 x 1.4 cm. There are multiple areas of vascular flow within the mass.  Ultrasound of the adjacent breast parenchyma shows a small cystic mass with an internal echogenic septation approximately 2 cm directly medial to the dominant palpable mass, at 8:30 position 6 cm from the  nipple. The smaller mass measures 0.7 x 0.4 x 0.5 cm and does not demonstrate vascular flow. There are mildly dilated ducts traversing the tissue between the two masses.  No axillary adenopathy is identified on the right.  IMPRESSION: 1. Suspicious palpable 1.9 cm mass in the 8:30 position of the right breast. Findings are suspicious for malignancy. 2. 0.7 cm cystic mass approximately 2 cm medial to the dominant palpable mass. Given the suspicious nature of the palpable mass and the proximity of the smaller mass and similar-appearing internal septation, biopsy is suggested to exclude malignancy.  RECOMMENDATION: Ultrasound-guided biopsies of the dominant suspicious palpable mass at 8:30 position 8 cm from the nipple and a second ultrasound-guided biopsy of a 0.7 cm indeterminate cystic mass at 8:30 position 6 cm from the nipple are recommended and are being scheduled for the patient.  I have discussed the findings and recommendations with the patient. Results were also provided in writing at the conclusion of the visit. If applicable, a reminder letter will be sent to the patient regarding the next appointment.  BI-RADS CATEGORY 4: Suspicious.   Electronically Signed By: [name] M.D. On: 06/21/2017 17:16       Patient's request of Dr. Rosana Hoes due to a right breast mass. The patient noted a right lower outer breast past 2 weeks ago. Patient with ultrasound as well as mammography with core biopsy which showed a 1.9 cm mass in the right lower outer quadrant ER negative PR negative HER-2/neu pending. Patient denies any other complaints currently. She does have soreness from the biopsy site.       ADDITIONAL INFORMATION: 1. PROGNOSTIC INDICATORS Results: IMMUNOHISTOCHEMICAL AND MORPHOMETRIC ANALYSIS PERFORMED MANUALLY Estrogen Receptor: 0%, NEGATIVE Progesterone Receptor: 0%,  NEGATIVE Proliferation Marker Ki67: 40% COMMENT: The negative  hormone receptor study(ies) in this case has An internal positive control. REFERENCE RANGE ESTROGEN RECEPTOR NEGATIVE 0% POSITIVE =>1% REFERENCE RANGE PROGESTERONE RECEPTOR NEGATIVE 0% POSITIVE =>1% All controls stained appropriately [name] MD Pathologist, Electronic Signature ( Signed 06/28/2017) FINAL DIAGNOSIS Diagnosis 1. Breast, right, needle core biopsy, 8:30 o'clock, 8cmfn - INVASIVE DUCTAL CARCINOMA. - DUCTAL CARCINOMA IN SITU. - SEE COMMENT. 1 of 3 FINAL for [name], [name] ([id number]) Diagnosis(continued) 2. Breast, right, needle core biopsy, 8:30 o'clock, 6cmfn - FIBROCYSTIC CHANGES. - THERE IS NO EVIDENCE OF MALIGNANCY Microscopic Comment 1. The carcinoma is grade III. A breast prognostic profile will be performed and the results reported separately. The results were called to the [facility] on 06/27/2017. [name] MD Pathologist, Electronic Signature (Case signed 06/27/2017) Specimen Gross and Clinical Information Specimen Comment 1. TIF: 3:40 PM; extracted < 30 sec; masses 2. TIF: 3:45 PM; extracted < 30 sec; masses Specimen(s) Obtained: 1. Breast, right, needle core biopsy, 8:30 o'clock, 8cmfn 2. Breast, right, needle core biopsy, 8:30 o'clock, 6cmfn Specimen Clinical Information 1. Poss FA vs IDC 2. Prob FCC Gross 1. Received labeled "[name],[name]" and "Rt breast 830 8cm" (TIF 1540 CIT <30 secs) are 3 cores of gray white soft to firm tissue, ranging from 1.1 x 0.2 x 0.15 cm to 1.5 x 0.2 x 0.2 cm. One block submitted. (SSW 10/2) 2. Received labeled "[name],[name]" and "Rt breast 830 6cm" (TIF 1545 CIT <30 secs) are 2 cores of gray white soft tissue, which average 1.2 x 0.15 x 0.15 cm. One block submitted. (SSW 10/2) Stain(s) used in Diagnosis: The following stain(s) were used in diagnosing the case: Her2 FISH, ER-ACIS, PR-ACIS, KI-67-ACIS. The control(s) stained appropriately. Disclaimer Estrogen receptor (6F11), immunohistochemical  stains are performed on formalin fixed, paraffin embedded tissue using a 3,3"-diaminobenzidine (DAB) chromogen and Leica Bond Autostainer System. The staining intensity of the nucleus is scored manually and is reported as the percentage of tumor cell nuclei demonstrating specific nuclear staining.Specimens are fixed in 10% Neutral Buffered Formalin for at least 6 hours and up to 72 hours. These tests have not be validated on decalcified tissue. Results should be interpreted with caution given the possibility of false negative results on decalcified specimens. HER2 IQFISH pharmDX (code [id number]) is a direct fluorescence in-situ hybridization assay designed to quantitatively determine HER2 gene amplification in formalin-fixed, paraffin-embedded tissue specimens. It is performed at [facility] and is reported using ASCO/CAP scoring criteria published in 2013. Ki-67 (MM1), immunohistochemical stains are performed on formalin fixed, paraffin embedded tissue using a 3,3"-diaminobenzidine (DAB) chromogen and Leica Bond Autostainer System. The staining intensity of the nucleus is scored manually and is reported as the percentage of tumor cell nuclei demonstrating specific nuclear staining.Specimens are fixed in 10% Neutral Buffered Formalin for at least 6 hours and up to 72 hours. These tests have not be validated on decalcified tissue. Results should be interpreted with caution given the possibility of false negative results on decalcified specimens. PR progesterone receptor (16), immunohistochemical stains are performed on formalin fixed, paraffin embedded tissue using a 3,3"-diaminobenzidine (DAB) chromogen and Leica Bond Autostainer System. The staining intensity of the nucleus is scored manually and is reported as the percentage of tumor cell nuclei demonstrating specific nuclear staining.Specimens are fixed in 10% Neutral Buffered Formalin for at least 6 hours and up to 72 hours. These tests have not  be validated on decalcified tissue. Results should be interpreted with caution given the  possibility of false negative results on decalcified specimens. 2 of 3 FINAL for Vilardi, Amrie L ([id number]) Report signed out from the following location(s) Technical Component was performed at [facility]. [address],[place] [address].[id number]., Technical Component was performed at [facility] [address], [place], [place] [address]. CLIA #: [id number], Interpretation was performed at [place] [address], [place], [place] [address]. CLIA #: [id number], 3 of.  The patient is a 46 year old female.   Past Surgical History (Tanisha A. Owens Shark, [place]; 06/29/2017 10:09 AM) No pertinent past surgical history  Diagnostic Studies History (Tanisha A. Owens Shark, [place]; 06/29/2017 10:09 AM) Colonoscopy never Mammogram within last year Pap Smear 1-5 years ago  Allergies (Tanisha A. Owens Shark, [place]; 06/29/2017 10:11 AM) SHELLFISH Swelling. Allergies Reconciled  Medication History (Tanisha A. Owens Shark, [place]; 06/29/2017 10:11 AM) Amoxicillin (875MG Tablet, Oral) Active. Meloxicam (15MG Tablet, Oral) Active. Pantoprazole Sodium (40MG Tablet DR, Oral) Active. TiZANidine HCl (4MG Tablet, Oral) Active. Proventil HFA (108 (90 Base)MCG/ACT Aerosol Soln, Inhalation) Active. Medications Reconciled  Social History (Tanisha A. Owens Shark, [place]; 06/29/2017 10:09 AM) No alcohol use No caffeine use No drug use Tobacco use Current some day smoker.  Family History (Tanisha A. Owens Shark, [place]; 06/29/2017 10:09 AM) Alcohol Abuse Father, Mother. Heart Disease Mother. Heart disease in female family member before age 30 Hypertension Father, Mother.  Pregnancy / Birth History (Tanisha A. Owens Shark, [place]; 06/29/2017 10:09 AM) Durenda Age 1 Irregular periods Maternal age 77-25 Para 2  Other Problems (Tanisha A. Brown, RMA; 06/29/2017  10:09 AM) Anxiety Disorder Asthma Gastroesophageal Reflux Disease Heart murmur Migraine Headache     Review of Systems (Tanisha A. Brown RMA; 06/29/2017 10:09 AM) General Not Present- Appetite Loss, Chills, Fatigue, Fever, Night Sweats, Weight Gain and Weight Loss. Skin Not Present- Change in Wart/Mole, Dryness, Hives, Jaundice, New Lesions, Non-Healing Wounds, Rash and Ulcer. HEENT Present- Ringing in the Ears, Seasonal Allergies and Wears glasses/contact lenses. Not Present- Earache, Hearing Loss, Hoarseness, Nose Bleed, Oral Ulcers, Sinus Pain, Sore Throat, Visual Disturbances and Yellow Eyes. Respiratory Not Present- Bloody sputum, Chronic Cough, Difficulty Breathing, Snoring and Wheezing. Breast Present- Breast Mass and Breast Pain. Not Present- Nipple Discharge and Skin Changes. Cardiovascular Present- Rapid Heart Rate. Not Present- Chest Pain, Difficulty Breathing Lying Down, Leg Cramps, Palpitations, Shortness of Breath and Swelling of Extremities. Gastrointestinal Not Present- Abdominal Pain, Bloating, Bloody Stool, Change in Bowel Habits, Chronic diarrhea, Constipation, Difficulty Swallowing, Excessive gas, Gets full quickly at meals, Hemorrhoids, Indigestion, Nausea, Rectal Pain and Vomiting. Female Genitourinary Not Present- Frequency, Nocturia, Painful Urination, Pelvic Pain and Urgency. Musculoskeletal Present- Back Pain, Joint Pain and Muscle Pain. Not Present- Joint Stiffness, Muscle Weakness and Swelling of Extremities. Neurological Present- Headaches. Not Present- Decreased Memory, Fainting, Numbness, Seizures, Tingling, Tremor, Trouble walking and Weakness. Psychiatric Present- Anxiety. Not Present- Bipolar, Change in Sleep Pattern, Depression, Fearful and Frequent crying. Endocrine Present- Hot flashes. Not Present- Cold Intolerance, Excessive Hunger, Hair Changes, Heat Intolerance and New Diabetes. Hematology Not Present- Blood Thinners, Easy Bruising, Excessive  bleeding, Gland problems, HIV and Persistent Infections.  Vitals (Tanisha A. Brown RMA; 06/29/2017 10:10 AM) 06/29/2017 10:10 AM Weight: 161.4 lb Height: 65in Body Surface Area: 1.81 m Body Mass Index: 26.86 kg/m  Temp.: 97.23F  Pulse: 96 (Regular)  BP: 124/82 (Sitting, Left Arm, Standard)      Physical Exam (Kathryn Cosby A. Ruthanna Macchia MD; 06/29/2017 11:59 AM)  General Mental Status-Alert. General Appearance-Consistent with stated age. Hydration-Well hydrated. Voice-Normal.  Head and Neck  Head-normocephalic, atraumatic with no lesions or palpable masses. Trachea-midline. Thyroid Gland Characteristics - normal size and consistency.  Chest and Lung Exam Chest and lung exam reveals -quiet, even and easy respiratory effort with no use of accessory muscles and on auscultation, normal breath sounds, no adventitious sounds and normal vocal resonance. Inspection Chest Wall - Normal. Back - normal.  Breast Note: Bruising right lower breast. 2 cm mobile mass right lower breast. Right axilla was normal. Left breast is normal.  Cardiovascular Cardiovascular examination reveals -normal heart sounds, regular rate and rhythm with no murmurs and normal pedal pulses bilaterally.  Neurologic Neurologic evaluation reveals -alert and oriented x 3 with no impairment of recent or remote memory. Mental Status-Normal.  Musculoskeletal Normal Exam - Left-Upper Extremity Strength Normal and Lower Extremity Strength Normal. Normal Exam - Right-Upper Extremity Strength Normal and Lower Extremity Strength Normal.  Lymphatic Head & Neck  General Head & Neck Lymphatics: Bilateral - Description - Normal. Axillary  General Axillary Region: Bilateral - Description - Normal. Tenderness - Non Tender.    Assessment & Plan (Geonna Lockyer A. Shirley Bolle MD; 06/29/2017 11:59 AM)  BREAST CANCER, RIGHT (C50.911)  The patient desires breast conservation,  This will  be a lumpectomy and sentinal lymph node mapping  And port.  The procedure has been discussed with the patient. Alternatives to surgery have been discussed with the patient.  Risks of surgery include bleeding,  Infection,  Seroma formation, death,  and the need for further surgery.   The patient understands and wishes to proceed.  Sentinel lymph node mapping and dissection has been discussed with the patient.  Risk of bleeding,  Infection,  Seroma formation,  Additional procedures,,  Shoulder weakness ,  Shoulder stiffness,  Nerve and blood vessel injury and reaction to the mapping dyes have been discussed.  Alternatives to surgery have been discussed with the patient.  The patient agrees to proceed. Impression: HER-2/neu status pending. I think she will benefit from neoadjuvant chemotherapy and I will refer her to medical oncology. I discussed port placement. I think she would be a good breast conserving candidate at some point as well. Pt requires port placement for chemotherapy. Risk include bleeding, infection, pneumothorax, hemothorax, mediastinal injury, nerve injury , blood vessel injury, strke, blood clots, death, migration. embolization and need for additional procedures. Pt agrees to proceed.  Current Plans You are being scheduled for surgery- Our schedulers will call you.  You should hear from our office's scheduling department within 5 working days about the location, date, and time of surgery. We try to make accommodations for patient's preferences in scheduling surgery, but sometimes the OR schedule or the surgeon's schedule prevents [place] from making those accommodations.  If you have not heard from our office [phone]) in 5 working days, call the office and ask for your surgeon's nurse.  If you have other questions about your diagnosis, plan, or surgery, call the office and ask for your surgeon's nurse.  Use of a central venous catheter for intravenous therapy was discussed.  Technique of catheter placement using ultrasound and fluoroscopy guidance was discussed. Risks such as bleeding, infection, pneumothorax, catheter occlusion, reoperation, and other risks were discussed. I noted a good likelihood this will help address the problem. Questions were answered. The patient expressed understanding & wishes to proceed. Pt Education - [facility] Free Text Education/Instructions: discussed with patient and provided information.    Electronically signed by Erroll Luna, MD at 06/29/2017 11:59 AM

## 2017-07-11 ENCOUNTER — Other Ambulatory Visit: Payer: Self-pay | Admitting: Surgery

## 2017-07-11 DIAGNOSIS — Z170 Estrogen receptor positive status [ER+]: Principal | ICD-10-CM

## 2017-07-11 DIAGNOSIS — C50511 Malignant neoplasm of lower-outer quadrant of right female breast: Secondary | ICD-10-CM

## 2017-07-13 ENCOUNTER — Ambulatory Visit
Admission: RE | Admit: 2017-07-13 | Discharge: 2017-07-13 | Disposition: A | Discharge status: Home / Self Care | Payer: Medicaid Other | Source: Ambulatory Visit | Attending: Radiation Oncology | Admitting: Radiation Oncology

## 2017-07-13 ENCOUNTER — Encounter: Payer: Self-pay | Admitting: Radiation Oncology

## 2017-07-13 DIAGNOSIS — C50411 Malignant neoplasm of upper-outer quadrant of right female breast: Secondary | ICD-10-CM | POA: Insufficient documentation

## 2017-07-13 DIAGNOSIS — Z171 Estrogen receptor negative status [ER-]: Secondary | ICD-10-CM | POA: Diagnosis present

## 2017-07-13 NOTE — Progress Notes (Signed)
Radiation Oncology         [phone]) [phone] ________________________________  Initial outpatient Consultation  Name: Katherine [name] MRN: [id number]  Date: 07/13/2017  DOB: 12/23/1970  CC:[name], MD  Erroll Luna, MD   REFERRING PHYSICIAN: Erroll Luna, MD  DIAGNOSIS:    ICD-10-CM   1. 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 female who presented with a palpable right breast nodule which she discovered on self breast examination around the end of September. She sought her PCP's guidance for this who ordered a mammogram and ultrasound urgently. She had a diagnostic mammogram performed on 06/21/17 which showed a suspicious palpable 1.9cm mass in the 8:20 position of the right breast which was suspicious for malignancy. Of note, a 0.7cm cystic mass approximately 2cm medial to the dominant palpable mass was seen as well. No axillary adenopathy was seen on ultrasound. Needle core biopsy was performed on 06/26/17 to evaluate both of these lesions. This revealed the dominant mass to be both invasive ductal carcinoma with some evidence of DCIS as well. The smaller, cystic mass only showed evidence of fibrocystic changes. Immunohistochemical revealed the larger mass to have diagnostic characteristics as described in the above diagnosis. As of today, she has met with both Dr  Lindi Adie and Dr Brantley Stage, her primary medical oncologist and surgeon, respectively. Her current plan is to undergo breast conservation surgery with sentinel lymph node biopsy (tentatively planned for 07/24/17) and adjuvant chemotherapy consisting of [place] followed by Herceptin maintenance for 1 year. She was subsequently referred to [place] to discuss the role of radiotherapy in the ongoing management of her disease.   Overall, she reports that she is feeling at her baseline state of health, which includes her arthritic pains and general migraine headaches. Neither of these have acutely changed recently and she it otherwise feeling well and in good spirits. Pt denies any other associated or acute complaints.   PREVIOUS RADIATION THERAPY: No  PAST MEDICAL HISTORY:  has a past medical history of Anxiety; Asthma; Dysrhythmia; Fibromyalgia; GERD (gastroesophageal reflux disease); Hot flashes; Migraine; Palpitations; Pneumonia; Tuberculosis; and Uterine fibroid.    PAST SURGICAL HISTORY: Past Surgical History:  Procedure Laterality Date  . TUBAL LIGATION  1993  . VAGINAL HYSTERECTOMY Bilateral 12/19/2016   Procedure: HYSTERECTOMY VAGINAL;  Surgeon: Lavonia Drafts, MD;  Location: [facility];  Service: Gynecology;  Laterality: Bilateral;  . WISDOM TOOTH EXTRACTION      FAMILY HISTORY: family history includes Breast cancer (age of onset: 22) in her sister; Heart disease in her father and mother; Hypertension in her father, mother, and unknown relative; Migraines in her daughter and son.  SOCIAL HISTORY:  reports that she has been smoking Cigarettes.  She has been smoking about 0.05 packs per day. She has never used smokeless tobacco. She reports that she does not drink alcohol or use drugs.  She is currently on disability for several reasons,  principally issues related to her heart and arthritic changes.   ALLERGIES: Morphine; Shellfish allergy; Penicillins; Codeine; Hydrocodone; and Tomato  MEDICATIONS:    Current Outpatient Prescriptions  Medication Sig Dispense Refill  . albuterol (PROVENTIL HFA;VENTOLIN HFA) 108 (90 Base) MCG/ACT inhaler Inhale 2 puffs into the lungs every 6 (six) hours as needed for wheezing or shortness of breath.    Marland Kitchen amoxicillin (AMOXIL) 875 MG tablet Take 875 mg by mouth 2 (two) times daily.  1  . pantoprazole (PROTONIX) 40 MG tablet Take 40 mg by mouth daily.    Marland Kitchen tiZANidine (ZANAFLEX) 4 MG tablet Take 4 mg by mouth every 6 (six) hours as needed for muscle spasms.    Marland Kitchen diltiazem (CARDIZEM CD) 240 MG 24 hr capsule Take 240 mg by mouth daily.   5  . docusate sodium (COLACE) 100 MG capsule Take 1 capsule (100 mg total) by mouth 2 (two) times daily. (Patient not taking: Reported on 01/29/2017) 10 capsule 0  . DULoxetine (CYMBALTA) 30 MG capsule Take 1 capsule (30 mg total) by mouth daily. (Patient not taking: Reported on 01/29/2017) 30 capsule 3  . ibuprofen (ADVIL,MOTRIN) 600 MG tablet Take 1 tablet (600 mg total) by mouth every 6 (six) hours as needed (mild pain). (Patient not taking: Reported on 01/29/2017) 30 tablet 0  . ipratropium (ATROVENT) 0.03 % nasal spray SPRAY TWO SQUIRTS INTO EACH NOSTRIL TWICE A DAY  1  . labetalol (NORMODYNE) 100 MG tablet Take 1 tablet (100 mg total) by mouth 2 (two) times daily. (Patient not taking: Reported on 01/29/2017) 60 tablet 6  . meloxicam (MOBIC) 15 MG tablet Take 15 mg by mouth daily.  3  . Multiple Vitamins-Minerals (HAIR SKIN AND NAILS FORMULA PO) Take 1 tablet by mouth daily.    Marland Kitchen oxyCODONE-acetaminophen (PERCOCET/ROXICET) 5-325 MG tablet Take 1-2 tablets by mouth every 4 (four) hours as needed (moderate to severe pain (when tolerating fluids)). (Patient not taking: Reported on 01/29/2017) 30 tablet 0  . SUMAtriptan (IMITREX) 50 MG tablet Take 50 mg by mouth daily as needed for migraine.  3   No current facility-administered medications for this encounter.     REVIEW OF SYSTEMS: A 10+ POINT REVIEW OF SYSTEMS WAS OBTAINED including  neurology, dermatology, psychiatry, cardiac, respiratory, lymph, extremities, GI, GU, Musculoskeletal, constitutional, breasts,  HEENT.  All pertinent positives are noted in the HPI.  All others are negative.   PHYSICAL EXAM:  weight is 159 lb 12.8 oz (72.5 kg). Her temperature is 98.8 F (37.1 C). Her blood pressure is 111/77 and her pulse is 69. Her oxygen saturation is 100%.   General: Alert and oriented, in no acute distress HEENT: Head is normocephalic. Extraocular movements are intact. Oropharynx is clear. She is missing several teeth.  Neck: Neck is supple, no palpable cervical or supraclavicular lymphadenopathy. Heart: Regular in rate and rhythm with no murmurs, rubs, or gallops. Chest: Clear to auscultation bilaterally, with no rhonchi, wheezes, or rales. Abdomen: Soft, nontender, nondistended, with no rigidity or guarding. Extremities: No cyanosis or edema. Good range of motion.    Lymphatics: see Neck Exam Skin: No concerning lesions. Musculoskeletal: symmetric strength and muscle tone throughout. Neurologic: Cranial nerves II through XII are grossly intact. No obvious focalities. Speech is fluent. Coordination is intact. Psychiatric: Judgment and insight are intact. Affect is appropriate. Breasts: She has some rounded thickening (about 2cm) around the 2 o'clock position of the left breast. At about 8 o'clock of the right breast she has a mass which  corresponds with biopsy site. No other palpable masses appreciated in the breasts or axillae.   ECOG = 1  LABORATORY DATA:  Lab Results  Component Value Date   WBC 8.8 01/21/2017   HGB 12.2 01/21/2017   HCT 36.8 01/21/2017   MCV 82.1 01/21/2017   PLT 272 01/21/2017   CMP     Component Value Date/Time   NA 137 01/21/2017 1346   K 3.8 01/21/2017 1346   CL 106 01/21/2017 1346   CO2 21 (L) 01/21/2017 1346   GLUCOSE 84 01/21/2017 1346   BUN 6 01/21/2017 1346   CREATININE 0.57 01/21/2017 1346   CREATININE 0.60 09/05/2013 1459     CALCIUM 9.4 01/21/2017 1346   PROT 7.8 12/07/2016 1010   ALBUMIN 4.1 12/07/2016 1010   AST 20 12/07/2016 1010   ALT 17 12/07/2016 1010   ALKPHOS 67 12/07/2016 1010   BILITOT 0.6 12/07/2016 1010   GFRNONAA >60 01/21/2017 1346   GFRNONAA >89 09/05/2013 1459   GFRAA >60 01/21/2017 1346   GFRAA >89 09/05/2013 1459         RADIOGRAPHY: US Breast Ltd Uni Right Inc Axilla  Result Date: 06/21/2017 CLINICAL DATA:  46 year old patient presents for evaluation of a palpable mass in the lower outer quadrant of the right breast. She noticed the mass approximately a week ago, and it is sometimes tender because it is near the peripheral margin of her bra. EXAM: 2D DIGITAL DIAGNOSTIC RIGHT MAMMOGRAM WITH CAD AND ADJUNCT TOMO ULTRASOUND RIGHT BREAST COMPARISON:  Screening mammogram August 07, 2016, and earlier priors. ACR Breast Density Category d: The breast tissue is extremely dense, which lowers the sensitivity of mammography. FINDINGS: A metallic skin marker was placed over the palpable lump in the lower outer quadrant of the right breast. A mass is seen deep within the right breast parenchyma in the lower outer quadrant, with indistinct margins. No suspicious microcalcification or architectural distortion is identified in the right breast. Mammographic images were processed with CAD. On physical exam, there is a firm fairly fixed mass in the 8:30 position of the right breast 8 cm from the nipple that measures approximately 2 cm on physical exam. Targeted ultrasound is performed, showing a predominantly hypoechoic irregular mass with indistinct margins, particularly along its medial aspect. The mass also has some angulated margins and contains multiple echogenic septations within it. The mass measures approximately 1.9 x 1.1 x 1.4 cm. There are multiple areas of vascular flow within the mass. Ultrasound of the adjacent breast parenchyma shows a small cystic mass with an internal echogenic septation  approximately 2 cm directly medial to the dominant palpable mass, at 8:30 position 6 cm from the nipple. The smaller mass measures 0.7 x 0.4 x 0.5 cm and does not demonstrate vascular flow. There are mildly dilated ducts traversing the tissue between the two masses. No axillary adenopathy is identified on the right. IMPRESSION: 1. Suspicious palpable 1.9 cm mass in the 8:30 position of the right breast. Findings are suspicious for malignancy. 2. 0.7 cm cystic mass approximately 2 cm medial to the dominant palpable mass. Given the suspicious nature of the palpable mass and the proximity of the smaller mass and similar-appearing internal septation, biopsy is suggested to exclude malignancy. RECOMMENDATION: Ultrasound-guided biopsies of the dominant suspicious palpable mass at 8:30 position 8 cm from the nipple and a second ultrasound-guided biopsy of a 0.7 cm indeterminate cystic mass at 8:30 position 6 cm from the nipple are recommended and are being scheduled for the  patient. I have discussed the findings and recommendations with the patient. Results were also provided in writing at the conclusion of the visit. If applicable, a reminder letter will be sent to the patient regarding the next appointment. BI-RADS CATEGORY  4: Suspicious. Electronically Signed   By: [name] M.D.   On: 06/21/2017 17:16   Mm Diag Breast Tomo Uni Right  Result Date: 06/21/2017 CLINICAL DATA:  46 year old patient presents for evaluation of a palpable mass in the lower outer quadrant of the right breast. She noticed the mass approximately a week ago, and it is sometimes tender because it is near the peripheral margin of her bra. EXAM: 2D DIGITAL DIAGNOSTIC RIGHT MAMMOGRAM WITH CAD AND ADJUNCT TOMO ULTRASOUND RIGHT BREAST COMPARISON:  Screening mammogram August 07, 2016, and earlier priors. ACR Breast Density Category d: The breast tissue is extremely dense, which lowers the sensitivity of mammography. FINDINGS: A metallic skin  marker was placed over the palpable lump in the lower outer quadrant of the right breast. A mass is seen deep within the right breast parenchyma in the lower outer quadrant, with indistinct margins. No suspicious microcalcification or architectural distortion is identified in the right breast. Mammographic images were processed with CAD. On physical exam, there is a firm fairly fixed mass in the 8:30 position of the right breast 8 cm from the nipple that measures approximately 2 cm on physical exam. Targeted ultrasound is performed, showing a predominantly hypoechoic irregular mass with indistinct margins, particularly along its medial aspect. The mass also has some angulated margins and contains multiple echogenic septations within it. The mass measures approximately 1.9 x 1.1 x 1.4 cm. There are multiple areas of vascular flow within the mass. Ultrasound of the adjacent breast parenchyma shows a small cystic mass with an internal echogenic septation approximately 2 cm directly medial to the dominant palpable mass, at 8:30 position 6 cm from the nipple. The smaller mass measures 0.7 x 0.4 x 0.5 cm and does not demonstrate vascular flow. There are mildly dilated ducts traversing the tissue between the two masses. No axillary adenopathy is identified on the right. IMPRESSION: 1. Suspicious palpable 1.9 cm mass in the 8:30 position of the right breast. Findings are suspicious for malignancy. 2. 0.7 cm cystic mass approximately 2 cm medial to the dominant palpable mass. Given the suspicious nature of the palpable mass and the proximity of the smaller mass and similar-appearing internal septation, biopsy is suggested to exclude malignancy. RECOMMENDATION: Ultrasound-guided biopsies of the dominant suspicious palpable mass at 8:30 position 8 cm from the nipple and a second ultrasound-guided biopsy of a 0.7 cm indeterminate cystic mass at 8:30 position 6 cm from the nipple are recommended and are being scheduled for the  patient. I have discussed the findings and recommendations with the patient. Results were also provided in writing at the conclusion of the visit. If applicable, a reminder letter will be sent to the patient regarding the next appointment. BI-RADS CATEGORY  4: Suspicious. Electronically Signed   By: [name] M.D.   On: 06/21/2017 17:16   Mm Clip Placement Right  Result Date: 06/26/2017 CLINICAL DATA:  Patient status post ultrasound-guided core needle biopsy 2 right breast masses. EXAM: DIAGNOSTIC RIGHT MAMMOGRAM POST ULTRASOUND BIOPSY COMPARISON:  Previous exam(s). FINDINGS: Mammographic images were obtained following ultrasound guided biopsy of 2 right breast masses: Site 1: Right breast mass 830 o'clock 8 cm from the nipple, ribbon shaped marking clip, in appropriate position. Site 2: Right breast mass 830  o'clock 6 cm from the nipple, coil shaped marking clip, in appropriate position. IMPRESSION: Appropriate position biopsy marking clip status post ultrasound-guided core needle biopsy 2 right breast masses. Final Assessment: Post Procedure Mammograms for Marker Placement Electronically Signed   By: Lovey Newcomer M.D.   On: 06/26/2017 16:34   [place] Rt Breast Bx W Loc Dev 1st Lesion Img Bx Spec US Guide  Addendum Date: 06/28/2017   ADDENDUM REPORT: 06/27/2017 15:07 ADDENDUM: Pathology revealed: GRADE III INVASIVE DUCTAL CARCINOMA, DUCTAL CARCINOMA IN SITU of the Right breast, 8:30 o'clock, 8cmfn. FIBROCYSTIC CHANGES of the Right breast, 8:30 o'clock, 6cmfn. This was found to be concordant by Dr. Lovey Newcomer. Pathology results were discussed with the patient by telephone. The patient reported doing well after the biopsies with tenderness at the sites. Post biopsy instructions and care were reviewed and questions were answered. The patient was encouraged to call [facility] for any additional concerns. Surgical consultation has been arranged with Dr. Erroll Luna at [facility] on June 29, 2017. Pathology results reported by Terie Purser, RN on 06/27/2017. Electronically Signed   By: Lovey Newcomer M.D.   On: 06/27/2017 15:07   Result Date: 06/28/2017 CLINICAL DATA:  Patient with indeterminate right breast mass. EXAM: ULTRASOUND GUIDED RIGHT BREAST CORE NEEDLE BIOPSY COMPARISON:  Previous exam(s). FINDINGS: I met with the patient and we discussed the procedure of ultrasound-guided biopsy, including benefits and alternatives. We discussed the high likelihood of a successful procedure. We discussed the risks of the procedure, including infection, bleeding, tissue injury, clip migration, and inadequate sampling. Informed written consent was given. The usual time-out protocol was performed immediately prior to the procedure. Lesion quadrant: Lower outer quadrant Using sterile technique and 1% Lidocaine as local anesthetic, under direct ultrasound visualization, a 12 gauge spring-loaded device was used to perform biopsy of right breast mass 830 o'clock 8 cm from the nipple using a lateral approach. At the conclusion of the procedure a ribbon shaped tissue marker clip was deployed into the biopsy cavity. Follow up 2 view mammogram was performed and dictated separately. IMPRESSION: Ultrasound guided biopsy of right breast mass. No apparent complications. Electronically Signed: By: Lovey Newcomer M.D. On: 06/26/2017 16:31   [place] Rt Breast Bx W Loc Dev Ea Add Lesion Img Bx Spec US Guide  Result Date: 06/26/2017 CLINICAL DATA:  Patient with indeterminate right breast mass. EXAM: ULTRASOUND GUIDED RIGHT BREAST CORE NEEDLE BIOPSY COMPARISON:  Previous exam(s). FINDINGS: I met with the patient and we discussed the procedure of ultrasound-guided biopsy, including benefits and alternatives. We discussed the high likelihood of a successful procedure. We discussed the risks of the procedure, including infection, bleeding, tissue injury, clip migration, and inadequate sampling. Informed  written consent was given. The usual time-out protocol was performed immediately prior to the procedure. Lesion quadrant: Lower outer quadrant Using sterile technique and 1% Lidocaine as local anesthetic, under direct ultrasound visualization, a 12 gauge spring-loaded device was used to perform biopsy of right breast mass 830 o'clock 6 cm from the nipple using a lateral approach. At the conclusion of the procedure a coil shaped tissue marker clip was deployed into the biopsy cavity. Follow up 2 view mammogram was performed and dictated separately. IMPRESSION: Ultrasound guided biopsy of right breast mass. No apparent complications. Electronically Signed   By: Lovey Newcomer M.D.   On: 06/26/2017 16:32      IMPRESSION/PLAN: This is a wonderful 46 y.o. female who presents today to discuss  the role radiotherapy may play in the ongoing management of her ER/PR(-), HER-2(+) breast cancer.   It was a pleasure meeting the patient today. We discussed the risks, benefits, and side effects of radiotherapy. I recommend radiotherapy to the right breast to reduce her risk of locoregional recurrence by 2/3.  We discussed that radiation would take approximately 4 weeks to complete and that I would give the patient a few weeks to heal following surgery before starting treatment planning. If chemotherapy were to be given, this would precede radiotherapy. We spoke about acute effects including skin irritation and fatigue as well as much less common late effects including internal organ injury or irritation. We spoke about the latest technology that is used to minimize the risk of late effects for patients undergoing radiotherapy to the breast or chest wall. No guarantees of treatment were given. The patient is enthusiastic about proceeding with treatment. I look forward to participating in the patient's care. I will await her referral back to me for postoperative follow-up and eventual CT simulation/treatment planning.    Today I  palpated a 2cm mass at 2 o'clock in the left breast. Her last left mammogram and [place] were performed in November 2017. At that time, cysts were presumed in the left breast in the 3 o'clock region and 3moUKoreaf/u was recommended. It does not appear that that was done. I will order repeat mammogram and ultrasound to work this up.   Smoking cessation instruction/counseling given: counseled patient on the dangers of tobacco use, advised patient to stop smoking, and reviewed strategies to maximize success of both treatment and normal wound healing. She is not ready to set a definitive date for quitting today.   I will make a referral to dentistry as she stated to me that she would like to have several teeth examined.     __________________________________________   SEppie Gibson MD  This document serves as a record of services personally performed by SEppie Gibson MD. It was created on her behalf by WReola Mosher a trained medical scribe. The creation of this record is based on the scribe's personal observations and the provider's statements to them. This document has been checked and approved by the attending provider.

## 2017-07-16 ENCOUNTER — Telehealth: Payer: Self-pay | Admitting: Hematology and Oncology

## 2017-07-16 NOTE — Telephone Encounter (Signed)
Call patient regarding 11/6 will mail letter

## 2017-07-17 ENCOUNTER — Encounter (HOSPITAL_BASED_OUTPATIENT_CLINIC_OR_DEPARTMENT_OTHER): Payer: Self-pay | Admitting: *Deleted

## 2017-07-17 ENCOUNTER — Encounter (HOSPITAL_BASED_OUTPATIENT_CLINIC_OR_DEPARTMENT_OTHER)
Admission: RE | Admit: 2017-07-17 | Discharge: 2017-07-17 | Disposition: A | Discharge status: Home / Self Care | Payer: Medicaid Other | Source: Ambulatory Visit | Attending: Surgery | Admitting: Surgery

## 2017-07-17 ENCOUNTER — Other Ambulatory Visit: Payer: Self-pay | Admitting: Radiation Oncology

## 2017-07-17 ENCOUNTER — Telehealth: Payer: Self-pay | Admitting: *Deleted

## 2017-07-17 ENCOUNTER — Other Ambulatory Visit: Payer: Self-pay

## 2017-07-17 DIAGNOSIS — Z01812 Encounter for preprocedural laboratory examination: Secondary | ICD-10-CM | POA: Insufficient documentation

## 2017-07-17 DIAGNOSIS — Z01810 Encounter for preprocedural cardiovascular examination: Secondary | ICD-10-CM | POA: Insufficient documentation

## 2017-07-17 DIAGNOSIS — C50411 Malignant neoplasm of upper-outer quadrant of right female breast: Secondary | ICD-10-CM

## 2017-07-17 DIAGNOSIS — R9431 Abnormal electrocardiogram [ECG] [EKG]: Secondary | ICD-10-CM | POA: Diagnosis not present

## 2017-07-17 DIAGNOSIS — Z171 Estrogen receptor negative status [ER-]: Principal | ICD-10-CM

## 2017-07-17 DIAGNOSIS — N6320 Unspecified lump in the left breast, unspecified quadrant: Secondary | ICD-10-CM

## 2017-07-17 DIAGNOSIS — K089 Disorder of teeth and supporting structures, unspecified: Secondary | ICD-10-CM

## 2017-07-17 LAB — COMPREHENSIVE METABOLIC PANEL
ALBUMIN: 4.1 g/dL (ref 3.5–5.0)
ALK PHOS: 75 U/L (ref 38–126)
ALT: 12 U/L — ABNORMAL LOW (ref 14–54)
ANION GAP: 7 (ref 5–15)
AST: 17 U/L (ref 15–41)
BUN: 6 mg/dL (ref 6–20)
CHLORIDE: 106 mmol/L (ref 101–111)
CO2: 24 mmol/L (ref 22–32)
Calcium: 9.1 mg/dL (ref 8.9–10.3)
Creatinine, Ser: 0.63 mg/dL (ref 0.44–1.00)
GFR calc Af Amer: >60 mL/min (ref >60)
GFR calc non Af Amer: >60 mL/min (ref >60)
GLUCOSE: 85 mg/dL (ref 65–99)
POTASSIUM: 4.4 mmol/L (ref 3.5–5.1)
SODIUM: 137 mmol/L (ref 135–145)
Total Bilirubin: 0.5 mg/dL (ref 0.3–1.2)
Total Protein: 6.9 g/dL (ref 6.5–8.1)

## 2017-07-17 LAB — CBC WITH DIFFERENTIAL/PLATELET
BASOS PCT: 0 %
Basophils Absolute: 0 10*3/uL (ref 0.0–0.1)
EOS ABS: 0.2 10*3/uL (ref 0.0–0.7)
EOS PCT: 3 %
HCT: 37.9 % (ref 36.0–46.0)
HEMOGLOBIN: 12.4 g/dL (ref 12.0–15.0)
Lymphocytes Relative: 50 %
Lymphs Abs: 3.6 10*3/uL (ref 0.7–4.0)
MCH: 26.5 pg (ref 26.0–34.0)
MCHC: 32.7 g/dL (ref 30.0–36.0)
MCV: 81 fL (ref 78.0–100.0)
MONOS PCT: 6 %
Monocytes Absolute: 0.5 10*3/uL (ref 0.1–1.0)
NEUTROS PCT: 41 %
Neutro Abs: 3 10*3/uL (ref 1.7–7.7)
PLATELETS: 238 10*3/uL (ref 150–400)
RBC: 4.68 MIL/uL (ref 3.87–5.11)
RDW: 14.6 % (ref 11.5–15.5)
WBC: 7.3 10*3/uL (ref 4.0–10.5)

## 2017-07-17 NOTE — Pre-Procedure Instructions (Signed)
Hibiclens wash given to patient with instructions.  Also pre surgery Ensure given and instructed to finish by 0630 DOS. She voiced understanding

## 2017-07-17 NOTE — Telephone Encounter (Signed)
"  Someone called this morning about lab appointment tomorrow.  What is the address I need to go to for lab?"  Provided [facility] schedule information for education class tomorrow at 9:00 am and F/U visit on 07-31-2017 at 9:15.  [place] office is located beside the [facility].  "No someone called about lab before surgery."  Provided [facility] number [phone].  Chart encounters reveals breast lumpectomy pending 07-24-2017 with Dr. Brantley Stage.  Denies further questions at this time.

## 2017-07-17 NOTE — Telephone Encounter (Signed)
Called patient to inform of mammo and ultrasound for 07-26-17 - arrival time - 10:20 am @ [facility], spoke with patient's husband - Maeson Lourenco

## 2017-07-18 ENCOUNTER — Telehealth: Payer: Self-pay | Admitting: *Deleted

## 2017-07-18 ENCOUNTER — Other Ambulatory Visit: Payer: Medicaid Other | Admitting: Hematology and Oncology

## 2017-07-18 ENCOUNTER — Other Ambulatory Visit: Payer: Medicaid Other

## 2017-07-18 NOTE — Telephone Encounter (Signed)
Called patient to inform of mammogram and [place] for 07-20-17 @ [facility], arrival time - 10:10 am, lvm for a return call

## 2017-07-18 NOTE — Telephone Encounter (Signed)
Was a no show for chemo ed class. Bary Castilla aware.

## 2017-07-20 ENCOUNTER — Other Ambulatory Visit: Payer: Self-pay | Admitting: Radiation Oncology

## 2017-07-20 ENCOUNTER — Ambulatory Visit
Admission: RE | Admit: 2017-07-20 | Discharge: 2017-07-20 | Disposition: A | Discharge status: Home / Self Care | Payer: Medicaid Other | Source: Ambulatory Visit | Attending: Radiation Oncology | Admitting: Radiation Oncology

## 2017-07-20 DIAGNOSIS — N6320 Unspecified lump in the left breast, unspecified quadrant: Secondary | ICD-10-CM

## 2017-07-20 DIAGNOSIS — Z171 Estrogen receptor negative status [ER-]: Principal | ICD-10-CM

## 2017-07-20 DIAGNOSIS — C50411 Malignant neoplasm of upper-outer quadrant of right female breast: Secondary | ICD-10-CM

## 2017-07-23 ENCOUNTER — Ambulatory Visit
Admission: RE | Admit: 2017-07-23 | Discharge: 2017-07-23 | Disposition: A | Discharge status: Home / Self Care | Payer: Medicaid Other | Source: Ambulatory Visit | Attending: Surgery | Admitting: Surgery

## 2017-07-23 ENCOUNTER — Other Ambulatory Visit: Payer: Self-pay | Admitting: Surgery

## 2017-07-23 ENCOUNTER — Other Ambulatory Visit (HOSPITAL_COMMUNITY): Payer: Self-pay | Admitting: Family Medicine

## 2017-07-23 DIAGNOSIS — C50511 Malignant neoplasm of lower-outer quadrant of right female breast: Secondary | ICD-10-CM

## 2017-07-23 DIAGNOSIS — Z170 Estrogen receptor positive status [ER+]: Principal | ICD-10-CM

## 2017-07-24 ENCOUNTER — Encounter (HOSPITAL_BASED_OUTPATIENT_CLINIC_OR_DEPARTMENT_OTHER)
Admission: RE | Disposition: A | Discharge status: Home / Self Care | Payer: Self-pay | Source: Ambulatory Visit | Attending: Surgery

## 2017-07-24 ENCOUNTER — Ambulatory Visit (HOSPITAL_COMMUNITY): Payer: Medicaid Other

## 2017-07-24 ENCOUNTER — Encounter (HOSPITAL_BASED_OUTPATIENT_CLINIC_OR_DEPARTMENT_OTHER): Payer: Self-pay

## 2017-07-24 ENCOUNTER — Ambulatory Visit (HOSPITAL_BASED_OUTPATIENT_CLINIC_OR_DEPARTMENT_OTHER): Payer: Medicaid Other | Admitting: Certified Registered"

## 2017-07-24 ENCOUNTER — Encounter (HOSPITAL_COMMUNITY): Payer: Medicaid Other

## 2017-07-24 ENCOUNTER — Ambulatory Visit (HOSPITAL_BASED_OUTPATIENT_CLINIC_OR_DEPARTMENT_OTHER)
Admission: RE | Admit: 2017-07-24 | Discharge: 2017-07-24 | Disposition: A | Discharge status: Home / Self Care | Payer: Medicaid Other | Source: Ambulatory Visit | Attending: Surgery | Admitting: Surgery

## 2017-07-24 ENCOUNTER — Ambulatory Visit (HOSPITAL_COMMUNITY)
Admission: RE | Admit: 2017-07-24 | Discharge: 2017-07-24 | Disposition: A | Discharge status: Home / Self Care | Payer: Medicaid Other | Source: Ambulatory Visit | Attending: Surgery | Admitting: Surgery

## 2017-07-24 ENCOUNTER — Ambulatory Visit
Admission: RE | Admit: 2017-07-24 | Discharge: 2017-07-24 | Disposition: A | Discharge status: Home / Self Care | Payer: Medicaid Other | Source: Ambulatory Visit | Attending: Surgery | Admitting: Surgery

## 2017-07-24 DIAGNOSIS — C50511 Malignant neoplasm of lower-outer quadrant of right female breast: Secondary | ICD-10-CM | POA: Diagnosis not present

## 2017-07-24 DIAGNOSIS — Z170 Estrogen receptor positive status [ER+]: Secondary | ICD-10-CM

## 2017-07-24 DIAGNOSIS — F17200 Nicotine dependence, unspecified, uncomplicated: Secondary | ICD-10-CM | POA: Insufficient documentation

## 2017-07-24 DIAGNOSIS — Z95828 Presence of other vascular implants and grafts: Secondary | ICD-10-CM

## 2017-07-24 DIAGNOSIS — Z79899 Other long term (current) drug therapy: Secondary | ICD-10-CM | POA: Diagnosis not present

## 2017-07-24 DIAGNOSIS — K219 Gastro-esophageal reflux disease without esophagitis: Secondary | ICD-10-CM | POA: Diagnosis not present

## 2017-07-24 DIAGNOSIS — J45909 Unspecified asthma, uncomplicated: Secondary | ICD-10-CM | POA: Insufficient documentation

## 2017-07-24 DIAGNOSIS — F419 Anxiety disorder, unspecified: Secondary | ICD-10-CM | POA: Insufficient documentation

## 2017-07-24 HISTORY — PX: PORTACATH PLACEMENT: SHX2246

## 2017-07-24 HISTORY — PX: BREAST LUMPECTOMY: SHX2

## 2017-07-24 HISTORY — PX: BREAST LUMPECTOMY WITH RADIOACTIVE SEED AND SENTINEL LYMPH NODE BIOPSY: SHX6550

## 2017-07-24 SURGERY — BREAST LUMPECTOMY WITH RADIOACTIVE SEED AND SENTINEL LYMPH NODE BIOPSY
Anesthesia: Regional | Site: Chest | Laterality: Right

## 2017-07-24 MED ORDER — EPHEDRINE SULFATE 50 MG/ML IJ SOLN
INTRAMUSCULAR | Status: DC | PRN
  Administered 2017-07-24: 10 mg via INTRAVENOUS

## 2017-07-24 MED ORDER — FENTANYL CITRATE (PF) 100 MCG/2ML IJ SOLN
INTRAMUSCULAR | Status: AC
  Filled 2017-07-24: qty 2

## 2017-07-24 MED ORDER — LACTATED RINGERS IV SOLN: 500.0000 mL | INTRAVENOUS | Status: DC

## 2017-07-24 MED ORDER — SODIUM CHLORIDE 0.9 % IJ SOLN
INTRAMUSCULAR | Status: AC
Start: 2017-07-24 — End: ?
  Filled 2017-07-24: qty 10

## 2017-07-24 MED ORDER — PROMETHAZINE HCL 25 MG/ML IJ SOLN
INTRAMUSCULAR | Status: AC
  Filled 2017-07-24: qty 1

## 2017-07-24 MED ORDER — CELECOXIB 200 MG PO CAPS
200.0000 mg | ORAL_CAPSULE | ORAL | Status: AC
  Administered 2017-07-24: 200 mg via ORAL

## 2017-07-24 MED ORDER — CHLORHEXIDINE GLUCONATE CLOTH 2 % EX PADS: 6.0000 | MEDICATED_PAD | Freq: Once | CUTANEOUS | Status: DC

## 2017-07-24 MED ORDER — ROPIVACAINE HCL 7.5 MG/ML IJ SOLN
INTRAMUSCULAR | Status: DC | PRN
  Administered 2017-07-24: 20 mL via PERINEURAL

## 2017-07-24 MED ORDER — TECHNETIUM TC 99M SULFUR COLLOID FILTERED
1.0000 | Freq: Once | INTRAVENOUS | Status: AC | PRN
  Administered 2017-07-24: 1 via INTRADERMAL

## 2017-07-24 MED ORDER — FENTANYL CITRATE (PF) 100 MCG/2ML IJ SOLN
50.0000 ug | INTRAMUSCULAR | Status: AC | PRN
  Administered 2017-07-24 (×4): 50 ug via INTRAVENOUS

## 2017-07-24 MED ORDER — MIDAZOLAM HCL 2 MG/2ML IJ SOLN
1.0000 mg | INTRAMUSCULAR | Status: DC | PRN
  Administered 2017-07-24: 2 mg via INTRAVENOUS

## 2017-07-24 MED ORDER — LIDOCAINE 2% (20 MG/ML) 5 ML SYRINGE
INTRAMUSCULAR | Status: DC | PRN
  Administered 2017-07-24: 30 mg via INTRAVENOUS

## 2017-07-24 MED ORDER — CLINDAMYCIN PHOSPHATE 600 MG/50ML IV SOLN
600.0000 mg | Freq: Once | INTRAVENOUS | Status: AC
  Administered 2017-07-24: 600 mg via INTRAVENOUS

## 2017-07-24 MED ORDER — HEPARIN (PORCINE) IN NACL 2-0.9 UNIT/ML-% IJ SOLN
INTRAMUSCULAR | Status: AC | PRN
  Administered 2017-07-24: 1

## 2017-07-24 MED ORDER — DEXAMETHASONE SODIUM PHOSPHATE 4 MG/ML IJ SOLN
INTRAMUSCULAR | Status: DC | PRN
  Administered 2017-07-24: 10 mg via INTRAVENOUS

## 2017-07-24 MED ORDER — CELECOXIB 200 MG PO CAPS
ORAL_CAPSULE | ORAL | Status: AC
  Filled 2017-07-24: qty 1

## 2017-07-24 MED ORDER — IBUPROFEN 800 MG PO TABS: 800.0000 mg | ORAL_TABLET | Freq: Three times a day (TID) | ORAL | 0 refills | Status: DC | PRN

## 2017-07-24 MED ORDER — BUPIVACAINE-EPINEPHRINE (PF) 0.25% -1:200000 IJ SOLN
INTRAMUSCULAR | Status: DC | PRN
  Administered 2017-07-24: 19 mL

## 2017-07-24 MED ORDER — HEPARIN (PORCINE) IN NACL 2-0.9 UNIT/ML-% IJ SOLN
INTRAMUSCULAR | Status: AC
  Filled 2017-07-24: qty 500

## 2017-07-24 MED ORDER — GABAPENTIN 300 MG PO CAPS
ORAL_CAPSULE | ORAL | Status: AC
  Filled 2017-07-24: qty 1

## 2017-07-24 MED ORDER — CIPROFLOXACIN IN D5W 400 MG/200ML IV SOLN
INTRAVENOUS | Status: AC
  Filled 2017-07-24: qty 200

## 2017-07-24 MED ORDER — SCOPOLAMINE 1 MG/3DAYS TD PT72: 1.0000 | MEDICATED_PATCH | Freq: Once | TRANSDERMAL | Status: DC | PRN

## 2017-07-24 MED ORDER — PROMETHAZINE HCL 25 MG/ML IJ SOLN
6.2500 mg | Freq: Once | INTRAMUSCULAR | Status: AC
  Administered 2017-07-24: 6.25 mg via INTRAVENOUS

## 2017-07-24 MED ORDER — CELECOXIB 100 MG PO CAPS: 100.0000 mg | ORAL_CAPSULE | Freq: Two times a day (BID) | ORAL | 2 refills | Status: DC

## 2017-07-24 MED ORDER — FENTANYL CITRATE (PF) 100 MCG/2ML IJ SOLN
25.0000 ug | INTRAMUSCULAR | Status: DC | PRN
  Administered 2017-07-24 (×2): 25 ug via INTRAVENOUS

## 2017-07-24 MED ORDER — CIPROFLOXACIN IN D5W 400 MG/200ML IV SOLN: 400.0000 mg | INTRAVENOUS | Status: DC

## 2017-07-24 MED ORDER — ACETAMINOPHEN 500 MG PO TABS
ORAL_TABLET | ORAL | Status: AC
  Filled 2017-07-24: qty 2

## 2017-07-24 MED ORDER — ACETAMINOPHEN 500 MG PO TABS
1000.0000 mg | ORAL_TABLET | ORAL | Status: AC
  Administered 2017-07-24: 1000 mg via ORAL

## 2017-07-24 MED ORDER — OXYCODONE HCL 5 MG PO TABS
ORAL_TABLET | ORAL | Status: AC
  Filled 2017-07-24: qty 1

## 2017-07-24 MED ORDER — CELECOXIB 100 MG PO CAPS
ORAL_CAPSULE | ORAL | Status: AC
  Filled 2017-07-24: qty 1

## 2017-07-24 MED ORDER — PROPOFOL 10 MG/ML IV BOLUS
INTRAVENOUS | Status: DC | PRN
  Administered 2017-07-24: 200 mg via INTRAVENOUS

## 2017-07-24 MED ORDER — HEPARIN SOD (PORK) LOCK FLUSH 100 UNIT/ML IV SOLN
INTRAVENOUS | Status: DC | PRN
  Administered 2017-07-24: 500 [IU] via INTRAVENOUS

## 2017-07-24 MED ORDER — CLINDAMYCIN PHOSPHATE 600 MG/50ML IV SOLN
INTRAVENOUS | Status: AC
  Filled 2017-07-24: qty 50

## 2017-07-24 MED ORDER — MIDAZOLAM HCL 2 MG/2ML IJ SOLN
INTRAMUSCULAR | Status: AC
  Filled 2017-07-24: qty 2

## 2017-07-24 MED ORDER — ONDANSETRON HCL 4 MG/2ML IJ SOLN
4.0000 mg | Freq: Once | INTRAMUSCULAR | Status: DC | PRN
Start: 2017-07-24 — End: 2017-07-24

## 2017-07-24 MED ORDER — HEPARIN SOD (PORK) LOCK FLUSH 100 UNIT/ML IV SOLN
INTRAVENOUS | Status: AC
  Filled 2017-07-24: qty 5

## 2017-07-24 MED ORDER — LACTATED RINGERS IV SOLN
INTRAVENOUS | Status: DC
  Administered 2017-07-24 (×2): via INTRAVENOUS

## 2017-07-24 MED ORDER — BUPIVACAINE-EPINEPHRINE (PF) 0.25% -1:200000 IJ SOLN
INTRAMUSCULAR | Status: AC
  Filled 2017-07-24: qty 30

## 2017-07-24 MED ORDER — GABAPENTIN 300 MG PO CAPS
300.0000 mg | ORAL_CAPSULE | ORAL | Status: AC
  Administered 2017-07-24: 300 mg via ORAL

## 2017-07-24 MED ORDER — CELECOXIB 100 MG PO CAPS: 100.0000 mg | ORAL_CAPSULE | Freq: Once | ORAL | Status: DC

## 2017-07-24 MED ORDER — ONDANSETRON HCL 4 MG/2ML IJ SOLN
INTRAMUSCULAR | Status: DC | PRN
  Administered 2017-07-24: 4 mg via INTRAVENOUS

## 2017-07-24 SURGICAL SUPPLY — 82 items
ADH SKN CLS APL DERMABOND .7 (GAUZE/BANDAGES/DRESSINGS) ×2
APL SKNCLS STERI-STRIP NONHPOA (GAUZE/BANDAGES/DRESSINGS)
APPLIER CLIP 9.375 MED OPEN (MISCELLANEOUS) ×4
APR CLP MED 9.3 20 MLT OPN (MISCELLANEOUS) ×2
BAG DECANTER FOR FLEXI CONT (MISCELLANEOUS) ×4 IMPLANT
BENZOIN TINCTURE PRP APPL 2/3 (GAUZE/BANDAGES/DRESSINGS) IMPLANT
BINDER BREAST LRG (GAUZE/BANDAGES/DRESSINGS) ×2 IMPLANT
BINDER BREAST MEDIUM (GAUZE/BANDAGES/DRESSINGS) IMPLANT
BINDER BREAST XLRG (GAUZE/BANDAGES/DRESSINGS) IMPLANT
BINDER BREAST XXLRG (GAUZE/BANDAGES/DRESSINGS) IMPLANT
BLADE HEX COATED 2.75 (ELECTRODE) ×4 IMPLANT
BLADE SURG 11 STRL SS (BLADE) ×4 IMPLANT
BLADE SURG 15 STRL LF DISP TIS (BLADE) ×2 IMPLANT
BLADE SURG 15 STRL SS (BLADE) ×4
CANISTER SUC SOCK COL 7IN (MISCELLANEOUS) IMPLANT
CANISTER SUCT 1200ML W/VALVE (MISCELLANEOUS) ×4 IMPLANT
CHLORAPREP W/TINT 26ML (MISCELLANEOUS) ×4 IMPLANT
CLIP APPLIE 9.375 MED OPEN (MISCELLANEOUS) ×2 IMPLANT
CLOSURE WOUND 1/2 X4 (GAUZE/BANDAGES/DRESSINGS)
COVER BACK TABLE 60X90IN (DRAPES) ×4 IMPLANT
COVER MAYO STAND STRL (DRAPES) ×4 IMPLANT
COVER PROBE 5X48 (MISCELLANEOUS) ×4
COVER PROBE W GEL 5X96 (DRAPES) ×4 IMPLANT
DECANTER SPIKE VIAL GLASS SM (MISCELLANEOUS) IMPLANT
DERMABOND ADVANCED (GAUZE/BANDAGES/DRESSINGS) ×2
DERMABOND ADVANCED .7 DNX12 (GAUZE/BANDAGES/DRESSINGS) ×2 IMPLANT
DEVICE DUBIN W/COMP PLATE 8390 (MISCELLANEOUS) ×4 IMPLANT
DRAPE C-ARM 42X72 X-RAY (DRAPES) ×4 IMPLANT
DRAPE LAPAROSCOPIC ABDOMINAL (DRAPES) ×4 IMPLANT
DRAPE UTILITY XL STRL (DRAPES) ×4 IMPLANT
DRSG TEGADERM 2-3/8X2-3/4 SM (GAUZE/BANDAGES/DRESSINGS) IMPLANT
ELECT COATED BLADE 2.86 ST (ELECTRODE) ×4 IMPLANT
ELECT REM PT RETURN 9FT ADLT (ELECTROSURGICAL) ×4
ELECTRODE REM PT RTRN 9FT ADLT (ELECTROSURGICAL) ×2 IMPLANT
GAUZE SPONGE 4X4 12PLY STRL LF (GAUZE/BANDAGES/DRESSINGS) IMPLANT
GLOVE BIO SURGEON STRL SZ 6.5 (GLOVE) ×1 IMPLANT
GLOVE BIO SURGEON STRL SZ7 (GLOVE) ×2 IMPLANT
GLOVE BIO SURGEONS STRL SZ 6.5 (GLOVE) ×1
GLOVE BIOGEL PI IND STRL 7.0 (GLOVE) IMPLANT
GLOVE BIOGEL PI IND STRL 8 (GLOVE) ×2 IMPLANT
GLOVE BIOGEL PI INDICATOR 7.0 (GLOVE) ×2
GLOVE BIOGEL PI INDICATOR 8 (GLOVE) ×2
GLOVE ECLIPSE 8.0 STRL XLNG CF (GLOVE) ×4 IMPLANT
GOWN STRL REUS W/ TWL LRG LVL3 (GOWN DISPOSABLE) ×4 IMPLANT
GOWN STRL REUS W/ TWL XL LVL3 (GOWN DISPOSABLE) IMPLANT
GOWN STRL REUS W/TWL LRG LVL3 (GOWN DISPOSABLE) ×8
GOWN STRL REUS W/TWL XL LVL3 (GOWN DISPOSABLE) ×4
HEMOSTAT ARISTA ABSORB 3G PWDR (MISCELLANEOUS) IMPLANT
HEMOSTAT SNOW SURGICEL 2X4 (HEMOSTASIS) ×2 IMPLANT
IV KIT MINILOC 20X1 SAFETY (NEEDLE) IMPLANT
KIT CVR 48X5XPRB PLUP LF (MISCELLANEOUS) ×2 IMPLANT
KIT MARKER MARGIN INK (KITS) ×4 IMPLANT
KIT PORT POWER 8FR ISP CVUE (Miscellaneous) ×2 IMPLANT
NDL HYPO 25X1 1.5 SAFETY (NEEDLE) ×2 IMPLANT
NDL SAFETY ECLIPSE 18X1.5 (NEEDLE) IMPLANT
NDL SPNL 22GX3.5 QUINCKE BK (NEEDLE) IMPLANT
NEEDLE HYPO 18GX1.5 SHARP (NEEDLE)
NEEDLE HYPO 22GX1.5 SAFETY (NEEDLE) IMPLANT
NEEDLE HYPO 25X1 1.5 SAFETY (NEEDLE) ×4 IMPLANT
NEEDLE SPNL 22GX3.5 QUINCKE BK (NEEDLE) IMPLANT
NS IRRIG 1000ML POUR BTL (IV SOLUTION) ×4 IMPLANT
PACK BASIN DAY SURGERY FS (CUSTOM PROCEDURE TRAY) ×4 IMPLANT
PENCIL BUTTON HOLSTER BLD 10FT (ELECTRODE) ×4 IMPLANT
SET SHEATH INTRODUCER 10FR (MISCELLANEOUS) IMPLANT
SHEATH COOK PEEL AWAY SET 9F (SHEATH) IMPLANT
SLEEVE SCD COMPRESS KNEE MED (MISCELLANEOUS) ×4 IMPLANT
SPONGE LAP 4X18 X RAY DECT (DISPOSABLE) ×4 IMPLANT
STRIP CLOSURE SKIN 1/2X4 (GAUZE/BANDAGES/DRESSINGS) IMPLANT
SUT MNCRL AB 4-0 PS2 18 (SUTURE) ×6 IMPLANT
SUT MON AB 4-0 PC3 18 (SUTURE) ×4 IMPLANT
SUT PROLENE 2 0 CT2 30 (SUTURE) IMPLANT
SUT PROLENE 2 0 SH DA (SUTURE) ×4 IMPLANT
SUT SILK 2 0 TIES 17X18 (SUTURE)
SUT SILK 2-0 18XBRD TIE BLK (SUTURE) IMPLANT
SUT VICRYL 3-0 CR8 SH (SUTURE) ×4 IMPLANT
SYR 5ML LUER SLIP (SYRINGE) ×4 IMPLANT
SYR CONTROL 10ML LL (SYRINGE) ×4 IMPLANT
TOWEL OR 17X24 6PK STRL BLUE (TOWEL DISPOSABLE) ×8 IMPLANT
TOWEL OR NON WOVEN STRL DISP B (DISPOSABLE) ×4 IMPLANT
TUBE CONNECTING 20'X1/4 (TUBING) ×1
TUBE CONNECTING 20X1/4 (TUBING) ×3 IMPLANT
YANKAUER SUCT BULB TIP NO VENT (SUCTIONS) ×4 IMPLANT

## 2017-07-24 NOTE — Anesthesia Preprocedure Evaluation (Addendum)
Anesthesia Evaluation  Patient identified by MRN, date of birth, ID band Patient awake    Reviewed: Allergy & Precautions, NPO status , Patient's Chart, lab work & pertinent test results  Airway Mallampati: I       Dental no notable dental hx.    Pulmonary asthma , Current Smoker,    Pulmonary exam normal        Cardiovascular Normal cardiovascular exam  ECG: NSR, rate 61  Study Conclusions  Stress ECG conclusions: The stress ECG was normal. Staged echo: There was no echocardiographic evidence for stress-induced ischemia.  Impressions: Stress echocardiogram with no chest pain, no ST changes and no stress-induced wall motion abnormalities; normal stress echocardiogram.    Neuro/Psych  Headaches, Anxiety    GI/Hepatic Neg liver ROS, GERD  Medicated and Controlled,  Endo/Other  negative endocrine ROS  Renal/GU negative Renal ROS     Musculoskeletal   Abdominal Normal abdominal exam  (+)   Peds  Hematology negative hematology ROS (+)   Anesthesia Other Findings        Reproductive/Obstetrics                             Anesthesia Physical  Anesthesia Plan  ASA: II  Anesthesia Plan: General and Regional   Post-op Pain Management: GA combined w/ Regional for post-op pain   Induction: Intravenous  PONV Risk Score and Plan: 2 and Ondansetron, Dexamethasone and Midazolam  Airway Management Planned: LMA  Additional Equipment:   Intra-op Plan:   Post-operative Plan: Extubation in OR  Informed Consent: I have reviewed the patients History and Physical, chart, labs and discussed the procedure including the risks, benefits and alternatives for the proposed anesthesia with the patient or authorized representative who has indicated his/her understanding and acceptance.   Dental advisory given  Plan Discussed with: CRNA  Anesthesia Plan Comments:         Anesthesia  Quick Evaluation

## 2017-07-24 NOTE — Anesthesia Procedure Notes (Signed)
Anesthesia Regional Block: Pectoralis block   Pre-Anesthetic Checklist: ,, timeout performed, Correct Patient, Correct Site, Correct Laterality, Correct Procedure,, site marked, risks and benefits discussed, Surgical consent,  Pre-op evaluation,  At surgeon's request and post-op pain management  Laterality: Right  Prep: chloraprep       Needles:  Injection technique: Single-shot  Needle Type: Echogenic Stimulator Needle     Needle Length: 9cm  Needle Gauge: 21     Additional Needles:   Procedures:,,,, ultrasound used (permanent image in chart),,,,  Narrative:  Start time: 07/24/2017 10:20 AM End time: 07/24/2017 10:30 AM Injection made incrementally with aspirations every 5 mL.  Performed by: Personally  Anesthesiologist: Adele Barthel P  Additional Notes: Functioning IV was confirmed and monitors were applied.  A 29mm 21ga Arrow echogenic stimulator needle was used. Sterile prep, hand hygiene and sterile gloves were used.  Negative aspiration and negative test dose prior to incremental administration of local anesthetic. The patient tolerated the procedure well.

## 2017-07-24 NOTE — Discharge Instructions (Signed)
[facility] °Office Phone Number [phone] ° °BREAST BIOPSY/ PARTIAL MASTECTOMY: POST OP INSTRUCTIONS ° °Always review your discharge instruction sheet given to you by the facility where your surgery was performed. ° °IF YOU HAVE DISABILITY OR FAMILY LEAVE FORMS, YOU MUST BRING THEM TO THE OFFICE FOR PROCESSING.  DO NOT GIVE THEM TO YOUR DOCTOR. ° °1. A prescription for pain medication may be given to you upon discharge.  Take your pain medication as prescribed, if needed.  If narcotic pain medicine is not needed, then you may take acetaminophen (Tylenol) or ibuprofen (Advil) as needed. °2. Take your usually prescribed medications unless otherwise directed °3. If you need a refill on your pain medication, please contact your pharmacy.  They will contact our office to request authorization.  Prescriptions will not be filled after 5pm or on week-ends. °4. You should eat very light the first 24 hours after surgery, such as soup, crackers, pudding, etc.  Resume your normal diet the day after surgery. °5. Most patients will experience some swelling and bruising in the breast.  Ice packs and a good support bra will help.  Swelling and bruising can take several days to resolve.  °6. It is common to experience some constipation if taking pain medication after surgery.  Increasing fluid intake and taking a stool softener will usually help or prevent this problem from occurring.  A mild laxative (Milk of Magnesia or Miralax) should be taken according to package directions if there are no bowel movements after 48 hours. °7. Unless discharge instructions indicate otherwise, you may remove your bandages 24-48 hours after surgery, and you may shower at that time.  You may have steri-strips (small skin tapes) in place directly over the incision.  These strips should be left on the skin for 7-10 days.  If your surgeon used skin glue on the incision, you may shower in 24 hours.  The glue will flake off over the  next 2-3 weeks.  Any sutures or staples will be removed at the office during your follow-up visit. °8. ACTIVITIES:  You may resume regular daily activities (gradually increasing) beginning the next day.  Wearing a good support bra or sports bra minimizes pain and swelling.  You may have sexual intercourse when it is comfortable. °a. You may drive when you no longer are taking prescription pain medication, you can comfortably wear a seatbelt, and you can safely maneuver your car and apply brakes. °b. RETURN TO WORK:  ______________________________________________________________________________________ °9. You should see your doctor in the office for a follow-up appointment approximately two weeks after your surgery.  Your doctor’s nurse will typically make your follow-up appointment when she calls you with your pathology report.  Expect your pathology report 2-3 business days after your surgery.  You may call to check if you do not hear from us after three days. °10. OTHER INSTRUCTIONS: _______________________________________________________________________________________________ _____________________________________________________________________________________________________________________________________ °_____________________________________________________________________________________________________________________________________ °_____________________________________________________________________________________________________________________________________ ° °WHEN TO CALL YOUR DOCTOR: °1. Fever over 101.0 °2. Nausea and/or vomiting. °3. Extreme swelling or bruising. °4. Continued bleeding from incision. °5. Increased pain, redness, or drainage from the incision. ° °The clinic staff is available to answer your questions during regular business hours.  Please don’t hesitate to call and ask to speak to one of the nurses for clinical concerns.  If you have a medical emergency, go to the nearest  emergency room or call 911.  A surgeon from [facility] is always on call at the hospital. ° °For further questions, please visit [email or web address]  ° ° ° ° ° ° °  PORT-A-CATH: POST OP INSTRUCTIONS  Always review your discharge instruction sheet given to you by the facility where your surgery was performed.   1. A prescription for pain medication may be given to you upon discharge. Take your pain medication as prescribed, if needed. If narcotic pain medicine is not needed, then you make take acetaminophen (Tylenol) or ibuprofen (Advil) as needed.  2. Take your usually prescribed medications unless otherwise directed. 3. If you need a refill on your pain medication, please contact our office. All narcotic pain medicine now requires a paper prescription.  Phoned in and fax refills are no longer allowed by law.  Prescriptions will not be filled after 5 pm or on weekends.  4. You should follow a light diet for the remainder of the day after your procedure. 5. Most patients will experience some mild swelling and/or bruising in the area of the incision. It may take several days to resolve. 6. It is common to experience some constipation if taking pain medication after surgery. Increasing fluid intake and taking a stool softener (such as Colace) will usually help or prevent this problem from occurring. A mild laxative (Milk of Magnesia or Miralax) should be taken according to package directions if there are no bowel movements after 48 hours.  7. Unless discharge instructions indicate otherwise, you may remove your bandages 48 hours after surgery, and you may shower at that time. You may have steri-strips (small white skin tapes) in place directly over the incision.  These strips should be left on the skin for 7-10 days.  If your surgeon used Dermabond (skin glue) on the incision, you may shower in 24 hours.  The glue will flake off over the next 2-3 weeks.  8. If your port is left accessed at  the end of surgery (needle left in port), the dressing cannot get wet and should only by changed by a healthcare professional. When the port is no longer accessed (when the needle has been removed), follow step 7.   9. ACTIVITIES:  Limit activity involving your arms for the next 72 hours. Do no strenuous exercise or activity for 1 week. You may drive when you are no longer taking prescription pain medication, you can comfortably wear a seatbelt, and you can maneuver your car. 10.You may need to see your doctor in the office for a follow-up appointment.  Please       check with your doctor.  11.When you receive a new Port-a-Cath, you will get a product guide and        ID card.  Please keep them in case you need them.  WHEN TO CALL YOUR DOCTOR ([phone]): 1. Fever over 101.0 2. Chills 3. Continued bleeding from incision 4. Increased redness and tenderness at the site 5. Shortness of breath, difficulty breathing   The clinic staff is available to answer your questions during regular business hours. Please don't hesitate to call and ask to speak to one of the nurses or medical assistants for clinical concerns. If you have a medical emergency, go to the nearest emergency room or call 911.  A surgeon from [facility] is always on call at the hospital.     For further information, please visit [email or web address]   Post Anesthesia Home Care Instructions  Activity: Get plenty of rest for the remainder of the day. A responsible individual must stay with you for 24 hours following the procedure.  For the next 24 hours, DO NOT: -Drive a car -Operate machinery -  Drink alcoholic beverages -Take any medication unless instructed by your physician -Make any legal decisions or sign important papers.  Meals: Start with liquid foods such as gelatin or soup. Progress to regular foods as tolerated. Avoid greasy, spicy, heavy foods. If nausea and/or vomiting occur, drink only  clear liquids until the nausea and/or vomiting subsides. Call your physician if vomiting continues.  Special Instructions/Symptoms: Your throat may feel dry or sore from the anesthesia or the breathing tube placed in your throat during surgery. If this causes discomfort, gargle with warm salt water. The discomfort should disappear within 24 hours.  If you had a scopolamine patch placed behind your ear for the management of post- operative nausea and/or vomiting:  1. The medication in the patch is effective for 72 hours, after which it should be removed.  Wrap patch in a tissue and discard in the trash. Wash hands thoroughly with soap and water. 2. You may remove the patch earlier than 72 hours if you experience unpleasant side effects which may include dry mouth, dizziness or visual disturbances. 3. Avoid touching the patch. Wash your hands with soap and water after contact with the patch.        

## 2017-07-24 NOTE — Progress Notes (Signed)
Assisted Dr. Ellender with right, ultrasound guided, pectoralis block. Side rails up, monitors on throughout procedure. See vital signs in flow sheet. Tolerated Procedure well. °

## 2017-07-24 NOTE — H&P (View-Only) (Signed)
Katherine [name] 06/29/2017 10:09 AM Location: [facility] Patient #: [id number] DOB: 30-Jan-1971 Married / Language: English / Race: Black or African American Female  History of Present Illness Katherine [name] A. Katherine Morici MD; 06/29/2017 11:58 AM) Patient words: CLINICAL DATA: 46 year old patient presents for evaluation of a palpable mass in the lower outer quadrant of the right breast. She noticed the mass approximately a week ago, and it is sometimes tender because it is near the peripheral margin of her bra.  EXAM: 2D DIGITAL DIAGNOSTIC RIGHT MAMMOGRAM WITH CAD AND ADJUNCT TOMO  ULTRASOUND RIGHT BREAST  COMPARISON: Screening mammogram August 07, 2016, and earlier priors.  ACR Breast Density Category d: The breast tissue is extremely dense, which lowers the sensitivity of mammography.  FINDINGS: A metallic skin marker was placed over the palpable lump in the lower outer quadrant of the right breast. A mass is seen deep within the right breast parenchyma in the lower outer quadrant, with indistinct margins. No suspicious microcalcification or architectural distortion is identified in the right breast.  Mammographic images were processed with CAD.  On physical exam, there is a firm fairly fixed mass in the 8:30 position of the right breast 8 cm from the nipple that measures approximately 2 cm on physical exam.  Targeted ultrasound is performed, showing a predominantly hypoechoic irregular mass with indistinct margins, particularly along its medial aspect. The mass also has some angulated margins and contains multiple echogenic septations within it. The mass measures approximately 1.9 x 1.1 x 1.4 cm. There are multiple areas of vascular flow within the mass.  Ultrasound of the adjacent breast parenchyma shows a small cystic mass with an internal echogenic septation approximately 2 cm directly medial to the dominant palpable mass, at 8:30 position 6 cm from the  nipple. The smaller mass measures 0.7 x 0.4 x 0.5 cm and does not demonstrate vascular flow. There are mildly dilated ducts traversing the tissue between the two masses.  No axillary adenopathy is identified on the right.  IMPRESSION: 1. Suspicious palpable 1.9 cm mass in the 8:30 position of the right breast. Findings are suspicious for malignancy. 2. 0.7 cm cystic mass approximately 2 cm medial to the dominant palpable mass. Given the suspicious nature of the palpable mass and the proximity of the smaller mass and similar-appearing internal septation, biopsy is suggested to exclude malignancy.  RECOMMENDATION: Ultrasound-guided biopsies of the dominant suspicious palpable mass at 8:30 position 8 cm from the nipple and a second ultrasound-guided biopsy of a 0.7 cm indeterminate cystic mass at 8:30 position 6 cm from the nipple are recommended and are being scheduled for the patient.  I have discussed the findings and recommendations with the patient. Results were also provided in writing at the conclusion of the visit. If applicable, a reminder letter will be sent to the patient regarding the next appointment.  BI-RADS CATEGORY 4: Suspicious.   Electronically Signed By: Katherine [name] M.D. On: 06/21/2017 17:16       Patient's request of Dr. Rosana [name] due to a right breast mass. The patient noted a right lower outer breast past 2 weeks ago. Patient with ultrasound as well as mammography with core biopsy which showed a 1.9 cm mass in the right lower outer quadrant ER negative PR negative HER-2/neu pending. Patient denies any other complaints currently. She does have soreness from the biopsy site.       ADDITIONAL INFORMATION: 1. PROGNOSTIC INDICATORS Results: IMMUNOHISTOCHEMICAL AND MORPHOMETRIC ANALYSIS PERFORMED MANUALLY Estrogen Receptor: 0%, NEGATIVE Progesterone Receptor: 0%,  NEGATIVE Proliferation Marker Ki67: 40% COMMENT: The negative  hormone receptor study(ies) in this case has An internal positive control. REFERENCE RANGE ESTROGEN RECEPTOR NEGATIVE 0% POSITIVE =>1% REFERENCE RANGE PROGESTERONE RECEPTOR NEGATIVE 0% POSITIVE =>1% All controls stained appropriately Katherine Males MD [name], Electronic Signature ( Signed 06/28/2017) FINAL DIAGNOSIS Diagnosis 1. Breast, right, needle core biopsy, 8:30 o'clock, 8cmfn - INVASIVE DUCTAL CARCINOMA. - DUCTAL CARCINOMA IN SITU. - SEE COMMENT. 1 of 3 FINAL for Katherine [name] ([id number]) Diagnosis(continued) 2. Breast, right, needle core biopsy, 8:30 o'clock, 6cmfn - FIBROCYSTIC CHANGES. - THERE IS NO EVIDENCE OF MALIGNANCY Microscopic Comment 1. The carcinoma is grade III. A breast prognostic profile will be performed and the results reported separately. The results were called to the Katherine [name] on 06/27/2017. Katherine Cutter MD [name], Electronic Signature (Case signed 06/27/2017) Specimen Gross and Clinical Information Specimen Comment 1. TIF: 3:40 PM; extracted < 30 sec; masses 2. TIF: 3:45 PM; extracted < 30 sec; masses Specimen(s) Obtained: 1. Breast, right, needle core biopsy, 8:30 o'clock, 8cmfn 2. Breast, right, needle core biopsy, 8:30 o'clock, 6cmfn Specimen Clinical Information 1. Poss FA vs IDC 2. Prob FCC Gross 1. Received labeled "Katherine [name]" and "Rt breast 830 8cm" (TIF 1540 CIT <30 secs) are 3 cores of gray white soft to firm tissue, ranging from 1.1 x 0.2 x 0.15 cm to 1.5 x 0.2 x 0.2 cm. One block submitted. (SSW 10/2) 2. Received labeled "Katherine [name]" and "Rt breast 830 6cm" (TIF 1545 CIT <30 secs) are 2 cores of gray white soft tissue, which average 1.2 x 0.15 x 0.15 cm. One block submitted. (SSW 10/2) Stain(s) used in Diagnosis: The following stain(s) were used in diagnosing the case: Her2 FISH, ER-ACIS, PR-ACIS, KI-67-ACIS. The control(s) stained appropriately. Disclaimer Estrogen receptor (6F11), immunohistochemical  stains are performed on formalin fixed, paraffin embedded tissue using a 3,3"-diaminobenzidine (DAB) chromogen and Leica Bond Autostainer System. The staining intensity of the nucleus is scored manually and is reported as the percentage of tumor cell nuclei demonstrating specific nuclear staining.Specimens are fixed in 10% Neutral Buffered Formalin for at least 6 hours and up to 72 hours. These tests have not be validated on decalcified tissue. Results should be interpreted with caution given the possibility of false negative results on decalcified specimens. HER2 IQFISH pharmDX (code [id number]) is a direct fluorescence in-situ hybridization assay designed to quantitatively determine HER2 gene amplification in formalin-fixed, paraffin-embedded tissue specimens. It is performed at [facility] and is reported using ASCO/CAP scoring criteria published in 2013. Ki-67 (MM1), immunohistochemical stains are performed on formalin fixed, paraffin embedded tissue using a 3,3"-diaminobenzidine (DAB) chromogen and Leica Bond Autostainer System. The staining intensity of the nucleus is scored manually and is reported as the percentage of tumor cell nuclei demonstrating specific nuclear staining.Specimens are fixed in 10% Neutral Buffered Formalin for at least 6 hours and up to 72 hours. These tests have not be validated on decalcified tissue. Results should be interpreted with caution given the possibility of false negative results on decalcified specimens. PR progesterone receptor (16), immunohistochemical stains are performed on formalin fixed, paraffin embedded tissue using a 3,3"-diaminobenzidine (DAB) chromogen and Leica Bond Autostainer System. The staining intensity of the nucleus is scored manually and is reported as the percentage of tumor cell nuclei demonstrating specific nuclear staining.Specimens are fixed in 10% Neutral Buffered Formalin for at least 6 hours and up to 72 hours. These tests have not  be validated on decalcified tissue. Results should be interpreted with caution given the  possibility of false negative results on decalcified specimens. 2 of 3 FINAL for Vilardi, Amrie [name] ([id number]) Report signed out from the following location(s) Technical Component was performed at [facility]. [address],[place] [address].[id number]., Technical Component was performed at [facility] [address], [place], [place] [address]. CLIA #: [id number], Interpretation was performed at [place] [facility] [place] [address], [place], [place] [address]. CLIA #: [id number], 3 of.  The patient is a 46 year old female.   Past Surgical History (Tanisha A. Owens Shark, [place]; 06/29/2017 10:09 AM) No pertinent past surgical history  Diagnostic Studies History (Tanisha A. Owens Shark, [place]; 06/29/2017 10:09 AM) Colonoscopy never Mammogram within last year Pap Smear 1-5 years ago  Allergies (Tanisha A. Owens Shark, [place]; 06/29/2017 10:11 AM) SHELLFISH Swelling. Allergies Reconciled  Medication History (Tanisha A. Owens Shark, [place]; 06/29/2017 10:11 AM) Amoxicillin (875MG Tablet, Oral) Active. Meloxicam (15MG Tablet, Oral) Active. Pantoprazole Sodium (40MG Tablet DR, Oral) Active. TiZANidine HCl (4MG Tablet, Oral) Active. Proventil HFA (108 (90 Base)MCG/ACT Aerosol Soln, Inhalation) Active. Medications Reconciled  Social History (Tanisha A. Owens Shark, [place]; 06/29/2017 10:09 AM) No alcohol use No caffeine use No drug use Tobacco use Current some day smoker.  Family History (Tanisha A. Owens Shark, [place]; 06/29/2017 10:09 AM) Alcohol Abuse Father, Mother. Heart Disease Mother. Heart disease in female family member before age 30 Hypertension Father, Mother.  Pregnancy / Birth History (Tanisha A. Owens Shark, [place]; 06/29/2017 10:09 AM) Durenda Age 1 Irregular periods Maternal age 77-25 Para 2  Other Problems (Tanisha A. Brown, RMA; 06/29/2017  10:09 AM) Anxiety Disorder Asthma Gastroesophageal Reflux Disease Heart murmur Migraine Headache     Review of Systems (Tanisha A. Brown RMA; 06/29/2017 10:09 AM) General Not Present- Appetite Loss, Chills, Fatigue, Fever, Night Sweats, Weight Gain and Weight Loss. Skin Not Present- Change in Wart/Mole, Dryness, Hives, Jaundice, New Lesions, Non-Healing Wounds, Rash and Ulcer. HEENT Present- Ringing in the Ears, Seasonal Allergies and Wears glasses/contact lenses. Not Present- Earache, Hearing Loss, Hoarseness, Nose Bleed, Oral Ulcers, Sinus Pain, Sore Throat, Visual Disturbances and Yellow Eyes. Respiratory Not Present- Bloody sputum, Chronic Cough, Difficulty Breathing, Snoring and Wheezing. Breast Present- Breast Mass and Breast Pain. Not Present- Nipple Discharge and Skin Changes. Cardiovascular Present- Rapid Heart Rate. Not Present- Chest Pain, Difficulty Breathing Lying Down, Leg Cramps, Palpitations, Shortness of Breath and Swelling of Extremities. Gastrointestinal Not Present- Abdominal Pain, Bloating, Bloody Stool, Change in Bowel Habits, Chronic diarrhea, Constipation, Difficulty Swallowing, Excessive gas, Gets full quickly at meals, Hemorrhoids, Indigestion, Nausea, Rectal Pain and Vomiting. Female Genitourinary Not Present- Frequency, Nocturia, Painful Urination, Pelvic Pain and Urgency. Musculoskeletal Present- Back Pain, Joint Pain and Muscle Pain. Not Present- Joint Stiffness, Muscle Weakness and Swelling of Extremities. Neurological Present- Headaches. Not Present- Decreased Memory, Fainting, Numbness, Seizures, Tingling, Tremor, Trouble walking and Weakness. Psychiatric Present- Anxiety. Not Present- Bipolar, Change in Sleep Pattern, Depression, Fearful and Frequent crying. Endocrine Present- Hot flashes. Not Present- Cold Intolerance, Excessive Hunger, Hair Changes, Heat Intolerance and New Diabetes. Hematology Not Present- Blood Thinners, Easy Bruising, Excessive  bleeding, Gland problems, HIV and Persistent Infections.  Vitals (Tanisha A. Brown RMA; 06/29/2017 10:10 AM) 06/29/2017 10:10 AM Weight: 161.4 lb Height: 65in Body Surface Area: 1.81 m Body Mass Index: 26.86 kg/m  Temp.: 97.23F  Pulse: 96 (Regular)  BP: 124/82 (Sitting, Left Arm, Standard)      Physical Exam (Joshu Furukawa A. Syanna Remmert MD; 06/29/2017 11:59 AM)  General Mental Status-Alert. General Appearance-Consistent with stated age. Hydration-Well hydrated. Voice-Normal.  Head and Neck  Head-normocephalic, atraumatic with no lesions or palpable masses. Trachea-midline. Thyroid Gland Characteristics - normal size and consistency.  Chest and Lung Exam Chest and lung exam reveals -quiet, even and easy respiratory effort with no use of accessory muscles and on auscultation, normal breath sounds, no adventitious sounds and normal vocal resonance. Inspection Chest Wall - Normal. Back - normal.  Breast Note: Bruising right lower breast. 2 cm mobile mass right lower breast. Right axilla was normal. Left breast is normal.  Cardiovascular Cardiovascular examination reveals -normal heart sounds, regular rate and rhythm with no murmurs and normal pedal pulses bilaterally.  Neurologic Neurologic evaluation reveals -alert and oriented x 3 with no impairment of recent or remote memory. Mental Status-Normal.  Musculoskeletal Normal Exam - Left-Upper Extremity Strength Normal and Lower Extremity Strength Normal. Normal Exam - Right-Upper Extremity Strength Normal and Lower Extremity Strength Normal.  Lymphatic Head & Neck  General Head & Neck Lymphatics: Bilateral - Description - Normal. Axillary  General Axillary Region: Bilateral - Description - Normal. Tenderness - Non Tender.    Assessment & Plan (Karna Abed A. Deanglo Hissong MD; 06/29/2017 11:59 AM)  BREAST CANCER, RIGHT (C50.911)  The patient desires breast conservation,  This will  be a lumpectomy and sentinal lymph node mapping  And port.  The procedure has been discussed with the patient. Alternatives to surgery have been discussed with the patient.  Risks of surgery include bleeding,  Infection,  Seroma formation, death,  and the need for further surgery.   The patient understands and wishes to proceed.  Sentinel lymph node mapping and dissection has been discussed with the patient.  Risk of bleeding,  Infection,  Seroma formation,  Additional procedures,,  Shoulder weakness ,  Shoulder stiffness,  Nerve and blood vessel injury and reaction to the mapping dyes have been discussed.  Alternatives to surgery have been discussed with the patient.  The patient agrees to proceed. Impression: HER-2/neu status pending. I think she will benefit from neoadjuvant chemotherapy and I will refer her to medical oncology. I discussed port placement. I think she would be a good breast conserving candidate at some point as well. Pt requires port placement for chemotherapy. Risk include bleeding, infection, pneumothorax, hemothorax, mediastinal injury, nerve injury , blood vessel injury, strke, blood clots, death, migration. embolization and need for additional procedures. Pt agrees to proceed.  Current Plans You are being scheduled for surgery- Our schedulers will call you.  You should hear from our office's scheduling department within 5 working days about the location, date, and time of surgery. We try to make accommodations for patient's preferences in scheduling surgery, but sometimes the OR schedule or the surgeon's schedule prevents [place] from making those accommodations.  If you have not heard from our office [phone]) in 5 working days, call the office and ask for your surgeon's nurse.  If you have other questions about your diagnosis, plan, or surgery, call the office and ask for your surgeon's nurse.  Use of a central venous catheter for intravenous therapy was discussed.  Technique of catheter placement using ultrasound and fluoroscopy guidance was discussed. Risks such as bleeding, infection, pneumothorax, catheter occlusion, reoperation, and other risks were discussed. I noted a good likelihood this will help address the problem. Questions were answered. The patient expressed understanding & wishes to proceed. Pt Education - [employer] Free Text Education/Instructions: discussed with patient and provided information.    Electronically signed by Erroll Luna, MD at 06/29/2017 11:59 AM

## 2017-07-24 NOTE — Anesthesia Procedure Notes (Signed)
Procedure Name: LMA Insertion Date/Time: 07/24/2017 11:06 AM Performed by: Richardo Popoff D Pre-anesthesia Checklist: Patient identified, Emergency Drugs available, Suction available and Patient being monitored Patient Re-evaluated:Patient Re-evaluated prior to induction Oxygen Delivery Method: Circle system utilized Preoxygenation: Pre-oxygenation with 100% oxygen Induction Type: IV induction Ventilation: Mask ventilation without difficulty LMA: LMA inserted LMA Size: 4.0 Number of attempts: 1 Airway Equipment and Method: Bite block Placement Confirmation: positive ETCO2 Tube secured with: Tape Dental Injury: Teeth and Oropharynx as per pre-operative assessment

## 2017-07-24 NOTE — Progress Notes (Signed)
Support for pt. During breast injections with nuclear med

## 2017-07-24 NOTE — Progress Notes (Signed)
LABS sent for [employer]. For research labs

## 2017-07-24 NOTE — Anesthesia Postprocedure Evaluation (Signed)
Anesthesia Post Note  Patient: WEDNESDAY ERICSSON  Procedure(s) Performed: RIGHT BREAST LUMPECTOMY WITH RADIOACTIVE SEED AND SENTINEL LYMPH NODE BIOPSY (Right Breast) INSERTION PORT-A-CATH (Right Chest)     Patient location during evaluation: PACU Anesthesia Type: Regional and General Level of consciousness: awake and alert Pain management: pain level controlled Vital Signs Assessment: post-procedure vital signs reviewed and stable Respiratory status: spontaneous breathing, nonlabored ventilation, respiratory function stable and patient connected to nasal cannula oxygen Cardiovascular status: blood pressure returned to baseline and stable Postop Assessment: no apparent nausea or vomiting Anesthetic complications: no    Last Vitals:  Vitals:   07/24/17 1400 07/24/17 1415  BP: 118/79 130/82  Pulse: 65 67  Resp: 14 17  Temp:    SpO2: 99% 97%    Last Pain:  Vitals:   07/24/17 1400  TempSrc:   PainSc: 0-No pain                 Ryan P Ellender

## 2017-07-24 NOTE — Interval H&P Note (Signed)
History and Physical Interval Note:  07/24/2017 10:38 AM  Katherine [name]  has presented today for surgery, with the diagnosis of RIGHT BREAST CANCER  The various methods of treatment have been discussed with the patient and family. After consideration of risks, benefits and other options for treatment, the patient has consented to  Procedure(s): RIGHT BREAST LUMPECTOMY WITH RADIOACTIVE SEED AND SENTINEL LYMPH NODE BIOPSY (Right) INSERTION PORT-A-CATH (N/A) as a surgical intervention .  The patient's history has been reviewed, patient examined, no change in status, stable for surgery.  I have reviewed the patient's chart and labs.  Questions were answered to the patient's satisfaction.     Katherine Derick A.

## 2017-07-24 NOTE — Op Note (Signed)
Preoperative diagnosis: Stage I right breast cancer lower outer quadrant  Postoperative diagnosis: Same  Procedure: Right breast seed localized partial mastectomy with right axillary deep sentinel lymph node mapping and placement of right internal jugular 8 French port with ultrasound and C-arm guidance  Surgeon: Erroll Luna MD  Anesthesia: General with local and regional block  EBL: 30 cc  Specimen: Right breast mass with seeding clip verified by radiology plus additional anterior and posterior margins with up to right axillary sentinel nodes hot  Drains: None  Indications for procedure: The patient is a 46 year old female with stage I right breast cancer lower outer quadrant.  She will require postoperative chemotherapy and needs support.  She is opted for breast conservation surgery after discussion of options, risks, benefits and other types of treatments.The procedure has been discussed with the patient. Alternatives to surgery have been discussed with the patient.  Risks of surgery include bleeding,  Infection,  Seroma formation, death,  and the need for further surgery.   The patient understands and wishes to proceed.Sentinel lymph node mapping and dissection has been discussed with the patient.  Risk of bleeding,  Infection,  Seroma formation,  Additional procedures,,  Shoulder weakness ,  Shoulder stiffness,  Nerve and blood vessel injury and reaction to the mapping dyes have been discussed.  Alternatives to surgery have been discussed with the patient.  The patient agrees to proceed.  Port risks include bleeding  Infection collapse lung heart injury port migration port failure injury to structures of mediastinum.   Description of procedure: The patient was met in the holding area and questions were answered.  The right breast was marked as the correct side.  She underwent injection by nuclear medicine.  Her seed was placed as an outpatient.  She was taken back to the operating room  after placement of regional block by anesthesia.  She was placed supine on the table.  General anesthesia initiated.  The right breast and right arm were prepped and draped in sterile fashion.  Timeout was done.  She received preoperative antibiotics.  The port was placed first.  The patient was placed in Trendelenburg.  Ultrasound was used to identify the right internal jugular vein.  A needle was placed into this under ultrasound guidance.  A wire was fed through this easily.  This was then verified by C-arm to be into the superior vena cava.  A small incision was made at the wire insertion site.  A small pocket was created just below the clavicle on the right side after injection with local anesthetic using a scalpel and cautery.  The port was flushed and brought on the field.  It was connected.  It was then tunneled from the lower incision the upper incision.  It was cut to approximately 20 cm.  The patient in Trendelenburg I fed the wire introducer complex of the wire removed and the wire to and fro with no resistance.  I then removed the wire and introducer.  The peel-away sheath was left in place.  The catheter was placed in this and the peel-away sheath was peeled.  The tip of the catheter on C-arm examination was in the mid superior vena cava.  Of note the patient had significant ectopy during the case and therefore I chose to leave the catheter higher to prevent any postoperative arrhythmias.  The port drew back dark nonpulsatile blood.  It flushed easily.  It was flushed with heparinized saline.  Incisions were closed with 3-0 Vicryl  and 4-0 Monocryl.  Dermabond applied.  Of note the port was secured to the chest wall with a 2-0 Prolene.  The neoprobe was used to identify the hot spot in the patient's right breast lower outer quadrant.  Incision made in the inferior mammary crease on the right lower quadrant region and dissection was carried superiorly.  All tissue around the seed and clip were excised  in its entirety.  Radiograph revealed both clips in the seed to be present.  Margins were grossly negative.  I took additional anterior and posterior margins because these appeared to be closed.  These were each sent separately after orienting all margins with ink.  Cavities found to be hemostatic.  It was closed with 3-0 Vicryl and 4-0 Monocryl after placement of clips to mark for radiation therapy.  The right axilla was then examined with the neoprobe.  Settings were set to technetium.  Small incision was made of 4 cm in the right axilla.  Dissection was carried down into the deep axillary basin level 2.  There were 2 hot sentinel nodes identified and excised.  Background counts approaches 0.  Hemostasis was achieved.  Wound was closed with 3-0 Vicryl and 4 0 Monocryl.  All final counts were found to be correct.  Dermabond applied.  Breast binder placed.  Patient was awoke taken to recovery in satisfactory condition.

## 2017-07-24 NOTE — Transfer of Care (Signed)
Immediate Anesthesia Transfer of Care Note  Patient: Katherine [name]  Procedure(s) Performed: RIGHT BREAST LUMPECTOMY WITH RADIOACTIVE SEED AND SENTINEL LYMPH NODE BIOPSY (Right Breast) INSERTION PORT-A-CATH (Right Chest)  Patient Location: PACU  Anesthesia Type:GA combined with regional for post-op pain  Level of Consciousness: awake, alert , oriented and patient cooperative  Airway & Oxygen Therapy: Patient Spontanous Breathing and Patient connected to face mask oxygen  Post-op Assessment: Report given to RN and Post -op Vital signs reviewed and stable  Post vital signs: Reviewed and stable  Last Vitals:  Vitals:   07/24/17 1052 07/24/17 1231  BP:  116/75  Pulse: 64 84  Resp: 15   Temp:    SpO2: 100% 100%    Last Pain:  Vitals:   07/24/17 0957  TempSrc: Oral  PainSc: 0-No pain         Complications: No apparent anesthesia complications

## 2017-07-25 ENCOUNTER — Telehealth (HOSPITAL_COMMUNITY): Payer: Self-pay | Admitting: Dentistry

## 2017-07-25 ENCOUNTER — Encounter (HOSPITAL_BASED_OUTPATIENT_CLINIC_OR_DEPARTMENT_OTHER): Payer: Self-pay | Admitting: Surgery

## 2017-07-25 NOTE — Telephone Encounter (Signed)
07/25/17  Pt. returned call to Dental Medicine regarding scheduling Dental Consult w/ Dr. Enrique Sack.  Pt. will call back to schedule appt. at a later date.  LRI

## 2017-07-25 NOTE — Telephone Encounter (Signed)
07/25/17  Called and left msg. on home # for pt. to call Dental Medicine.  Unable to leave msg. on mobile #.  LRI

## 2017-07-26 ENCOUNTER — Telehealth: Payer: Self-pay

## 2017-07-26 ENCOUNTER — Other Ambulatory Visit: Payer: Medicaid Other

## 2017-07-26 NOTE — Telephone Encounter (Signed)
Spoke with patient per 10/30 inbasket for  Chemo education.  Pt aware that appts are back to back dur to other multi appts/days  Katherine [name]

## 2017-07-31 ENCOUNTER — Ambulatory Visit (HOSPITAL_BASED_OUTPATIENT_CLINIC_OR_DEPARTMENT_OTHER): Payer: Medicaid Other | Admitting: Hematology and Oncology

## 2017-07-31 ENCOUNTER — Other Ambulatory Visit: Payer: Medicaid Other

## 2017-07-31 ENCOUNTER — Encounter: Payer: Self-pay | Admitting: *Deleted

## 2017-07-31 VITALS — BP 116/72 | HR 64 | Temp 98.3°F | Resp 18 | Ht 65.0 in | Wt 159.7 lb

## 2017-07-31 DIAGNOSIS — C50411 Malignant neoplasm of upper-outer quadrant of right female breast: Secondary | ICD-10-CM | POA: Diagnosis not present

## 2017-07-31 DIAGNOSIS — Z171 Estrogen receptor negative status [ER-]: Secondary | ICD-10-CM | POA: Diagnosis not present

## 2017-07-31 MED ORDER — LIDOCAINE-PRILOCAINE 2.5-2.5 % EX CREA: TOPICAL_CREAM | CUTANEOUS | 3 refills | Status: DC

## 2017-07-31 MED ORDER — ONDANSETRON HCL 8 MG PO TABS: 8.0000 mg | ORAL_TABLET | Freq: Two times a day (BID) | ORAL | 1 refills | Status: DC | PRN

## 2017-07-31 MED ORDER — LORAZEPAM 0.5 MG PO TABS: 0.5000 mg | ORAL_TABLET | Freq: Every evening | ORAL | 0 refills | Status: DC | PRN

## 2017-07-31 MED ORDER — PROCHLORPERAZINE MALEATE 10 MG PO TABS: 10.0000 mg | ORAL_TABLET | Freq: Four times a day (QID) | ORAL | 1 refills | Status: DC | PRN

## 2017-07-31 MED FILL — LIDOCAINE-PRILOCAINE CREAM: 2.5-2.5 | 30 days supply | Qty: 30 | Fill #0

## 2017-07-31 MED FILL — PROCHLORPERAZINE 10 MG TAB: 10 | 8 days supply | Qty: 30 | Fill #0

## 2017-07-31 MED FILL — ONDANSETRON HCL 8 MG TAB: 8 | 15 days supply | Qty: 30 | Fill #0

## 2017-07-31 NOTE — Assessment & Plan Note (Signed)
07/24/2017:Right lumpectomy: IDC grade 3, 1.8 cm, 0/2 lymph nodes negative,ER 0%, PR 0%, Ki-67 40%, HER-2 positive ratio 3.59, T1CN0 stage I a  Pathology counseling: I discussed the final pathology report of the patient provided  a copy of this report. I discussed the margins as well as lymph node surgeries. We also discussed the final staging along with previously performed ER/PR and HER-2/neu testing.  Recommendation: 1.  Adjuvant chemotherapy with Taxol Herceptin followed by Herceptin maintenance for 1 year 2. followed by adjuvant radiation therapy  Return to clinic in 3 weeks to start chemotherapy

## 2017-07-31 NOTE — Progress Notes (Signed)
START ON PATHWAY REGIMEN - Breast   Paclitaxel Weekly + Trastuzumab Weekly:   Administer weekly:     Paclitaxel      Trastuzumab      Trastuzumab   **Always confirm dose/schedule in your pharmacy ordering system**    Trastuzumab (Maintenance - NO Loading Dose):   A cycle is every 21 days:     Trastuzumab   **Always confirm dose/schedule in your pharmacy ordering system**    Patient Characteristics: Postoperative without Neoadjuvant Therapy (Pathologic Staging), Invasive Disease, Adjuvant Therapy, HER2 Positive, ER Negative/Unknown, Node Negative, pT1c, , pN0/N36m Therapeutic Status: Postoperative without Neoadjuvant Therapy (Pathologic Staging) AJCC Grade: G3 AJCC N Category: pN0 AJCC M Category: cM0 ER Status: Negative (-) AJCC 8 Stage Grouping: IA HER2 Status: Positive (+) Oncotype Dx Recurrence Score: Not Appropriate AJCC T Category: pT1c PR Status: Negative (-) Intent of Therapy: Curative Intent, Discussed with Patient

## 2017-07-31 NOTE — Progress Notes (Signed)
Patient Care Team: [name], MD as PCP - General (Family Medicine) Erroll Luna, MD as Consulting Physician (General Surgery)  DIAGNOSIS:  Encounter Diagnosis  Name Primary?  . Malignant neoplasm of upper-outer quadrant of right breast in female, estrogen receptor negative ([place])     SUMMARY OF ONCOLOGIC HISTORY:   Malignant neoplasm of upper-outer quadrant of right breast in female, estrogen receptor negative ([place])   06/26/2017 Initial Diagnosis    Right breast biopsy 8:30 position 8 cm from nipple: IDC with DCIS, second biopsy 6 cm from nipple fibrocystic changes: Grade 3, ER 0%, PR 0%, Ki-67 40%, HER-2 positive ratio 3.59; mammogram and ultrasound revealed 1.9 cm mass at 8:30 position, adjacent 0.7 cm cystic mass, T1c N0 stage IA AJCC 8       07/24/2017 Surgery    Right lumpectomy: IDC grade 3, 1.8 cm, 0/2 lymph nodes negative,ER 0%, PR 0%, Ki-67 40%, HER-2 positive ratio 3.59, T1CN0 stage I a        CHIEF COMPLIANT: Follow-up after recent surgery to discuss her treatment plan  INTERVAL HISTORY: Katherine [name] is a 46 year old with above-mentioned history of right breast cancer treated with lumpectomy and is here today to discuss the pathology report.  She is complaining of itching sensation around the port on the surgical incisions but otherwise doing quite well.  REVIEW OF SYSTEMS:   Constitutional: Denies fevers, chills or abnormal weight loss Eyes: Denies blurriness of vision Ears, nose, mouth, throat, and face: Denies mucositis or sore throat Respiratory: Denies cough, dyspnea or wheezes Cardiovascular: Denies palpitation, chest discomfort Gastrointestinal:  Denies nausea, heartburn or change in bowel habits Skin: Denies abnormal skin rashes Lymphatics: Denies new lymphadenopathy or easy bruising Neurological:Denies numbness, tingling or new weaknesses Behavioral/Psych: Mood is stable, no new changes  Extremities: No lower extremity edema Breast:  denies any  pain or lumps or nodules in either breasts All other systems were reviewed with the patient and are negative.  I have reviewed the past medical history, past surgical history, social history and family history with the patient and they are unchanged from previous note.  ALLERGIES:  is allergic to morphine; shellfish allergy; penicillins; codeine; hydrocodone; and tomato.  MEDICATIONS:  Current Outpatient Medications  Medication Sig Dispense Refill  . albuterol (PROVENTIL HFA;VENTOLIN HFA) 108 (90 Base) MCG/ACT inhaler Inhale 2 puffs into the lungs every 6 (six) hours as needed for wheezing or shortness of breath.    . celecoxib (CELEBREX) 100 MG capsule Take 1 capsule (100 mg total) by mouth 2 (two) times daily. 60 capsule 2  . ibuprofen (ADVIL,MOTRIN) 800 MG tablet Take 1 tablet (800 mg total) by mouth every 8 (eight) hours as needed. 30 tablet 0  . ipratropium (ATROVENT) 0.03 % nasal spray SPRAY TWO SQUIRTS INTO EACH NOSTRIL TWICE A DAY  1  . pantoprazole (PROTONIX) 40 MG tablet Take 40 mg by mouth daily.    . SUMAtriptan (IMITREX) 50 MG tablet Take 50 mg by mouth daily as needed for migraine.  3  . tiZANidine (ZANAFLEX) 4 MG tablet Take 4 mg by mouth every 6 (six) hours as needed for muscle spasms.     No current facility-administered medications for this visit.     PHYSICAL EXAMINATION: ECOG PERFORMANCE STATUS: 1 - Symptomatic but completely ambulatory  Vitals:   07/31/17 0907  BP: 116/72  Pulse: 64  Resp: 18  Temp: 98.3 F (36.8 C)  SpO2: 100%   Filed Weights   07/31/17 0907  Weight: 159 lb  11.2 oz (72.4 kg)    GENERAL:alert, no distress and comfortable SKIN: skin color, texture, turgor are normal, no rashes or significant lesions EYES: normal, Conjunctiva are pink and non-injected, sclera clear OROPHARYNX:no exudate, no erythema and lips, buccal mucosa, and tongue normal  NECK: supple, thyroid normal size, non-tender, without nodularity LYMPH:  no palpable  lymphadenopathy in the cervical, axillary or inguinal LUNGS: clear to auscultation and percussion with normal breathing effort HEART: regular rate & rhythm and no murmurs and no lower extremity edema ABDOMEN:abdomen soft, non-tender and normal bowel sounds MUSCULOSKELETAL:no cyanosis of digits and no clubbing  NEURO: alert & oriented x 3 with fluent speech, no focal motor/sensory deficits EXTREMITIES: No lower extremity edema  LABORATORY DATA:  I have reviewed the data as listed   Chemistry      Component Value Date/Time   NA 137 07/17/2017 1512   K 4.4 07/17/2017 1512   CL 106 07/17/2017 1512   CO2 24 07/17/2017 1512   BUN 6 07/17/2017 1512   CREATININE 0.63 07/17/2017 1512   CREATININE 0.60 09/05/2013 1459      Component Value Date/Time   CALCIUM 9.1 07/17/2017 1512   ALKPHOS 75 07/17/2017 1512   AST 17 07/17/2017 1512   ALT 12 (L) 07/17/2017 1512   BILITOT 0.5 07/17/2017 1512       Lab Results  Component Value Date   WBC 7.3 07/17/2017   HGB 12.4 07/17/2017   HCT 37.9 07/17/2017   MCV 81.0 07/17/2017   PLT 238 07/17/2017   NEUTROABS 3.0 07/17/2017    ASSESSMENT & PLAN:  Malignant neoplasm of upper-outer quadrant of right breast in female, estrogen receptor negative ([place]) 07/24/2017:Right lumpectomy: IDC grade 3, 1.8 cm, 0/2 lymph nodes negative,ER 0%, PR 0%, Ki-67 40%, HER-2 positive ratio 3.59, T1CN0 stage I a  Pathology counseling: I discussed the final pathology report of the patient provided  a copy of this report. I discussed the margins as well as lymph node surgeries. We also discussed the final staging along with previously performed ER/PR and HER-2/neu testing.  Recommendation: 1.  Adjuvant chemotherapy with Taxol Herceptin followed by Herceptin maintenance for 1 year 2. followed by adjuvant radiation therapy  Return to clinic in 3 weeks to start chemotherapy   I spent 25 minutes talking to the patient of which more than half was spent in counseling  and coordination of care.  No orders of the defined types were placed in this encounter.  The patient has a good understanding of the overall plan. she agrees with it. she will call with any problems that may develop before the next visit here.   Rulon Eisenmenger, MD 07/31/17

## 2017-08-02 ENCOUNTER — Ambulatory Visit: Payer: Medicaid Other | Admitting: Adult Health

## 2017-08-02 ENCOUNTER — Encounter: Payer: Self-pay | Admitting: Adult Health

## 2017-08-02 VITALS — BP 102/71 | HR 65 | Wt 160.4 lb

## 2017-08-02 DIAGNOSIS — G44229 Chronic tension-type headache, not intractable: Secondary | ICD-10-CM

## 2017-08-02 DIAGNOSIS — C50919 Malignant neoplasm of unspecified site of unspecified female breast: Secondary | ICD-10-CM

## 2017-08-02 DIAGNOSIS — R413 Other amnesia: Secondary | ICD-10-CM

## 2017-08-02 NOTE — Patient Instructions (Signed)
Your Plan:  Continue to monitor  Speak to oncologist- if amendable we could try another medication for headache instead of cymbalta. Could start low dose tizanidine for headaches.  If your symptoms worsen or you develop new symptoms please let us know.    Thank you for coming to see [place] at [facility]. I hope we have been able to provide you high quality care today.  You may receive a patient satisfaction survey over the next few weeks. We would appreciate your feedback and comments so that we may continue to improve ourselves and the health of our patients.

## 2017-08-02 NOTE — Progress Notes (Signed)
I have read the note, and I agree with the clinical assessment and plan.  Kiante Ciavarella KEITH   

## 2017-08-02 NOTE — Progress Notes (Signed)
PATIENT: Katherine [name] DOB: [birth year]  REASON FOR VISIT: follow up HISTORY FROM: patient  HISTORY OF PRESENT ILLNESS: Today 08/02/17 Katherine [name] is a 46 year old female with a history of headaches and memory disturbance.  She returns today for follow-up.  She states that she has been diagnosed with stage I breast cancer.  She will be starting treatment after Thanksgiving.  She reports that her oncologist has taken her off of Cymbalta.  She states that she has a headache every other day.  It is typically located on the right side extending from the temporal region to the occipital region.  She does have photophobia and phonophobia.  She denies nausea and vomiting.  She states when she gets the headache she typically lays down and the headache will resolve in 1-2 hours.  She states that she is hesitant to start a new medication as she is unsure if her oncologist will allow her to stay on it.  She states in the past muscle relaxers have offered her some benefit.  She feels that her memory is stable.  She lives at home with her husband.  She is able to complete ADLs independently.  She does operate a motor vehicle but states that she does not drive that often.  She reports that she can prepare her own meals without difficulty.  Reports good appetite.  Denies any trouble sleeping.  Reports that she can manage her finances without difficulty.     REVIEW OF SYSTEMS: Out of a complete 14 system review of symptoms, the patient complains only of the following symptoms, and all other reviewed systems are negative.  Food allergies, dizziness, headache, blurred vision, joint pain, back pain    ALLERGIES: Allergies  Allergen Reactions  . Morphine Anaphylaxis, Hives and Swelling  . Shellfish Allergy Anaphylaxis  . Penicillins Other (See Comments)    Yeast infection Has patient had a PCN reaction causing immediate rash, facial/tongue/throat swelling, SOB or lightheadedness with hypotension: no Has patient  had a PCN reaction causing severe rash involving mucus membranes or skin necrosis: no Has patient had a PCN reaction that required hospitalization no Has patient had a PCN reaction occurring within the last 10 years:no If all of the above answers are "NO", then may proceed with Cephalosporin use.   . Codeine Hives  . Hydrocodone Hives    Pt can take percocet   . Tomato Rash    HOME MEDICATIONS: Outpatient Medications Prior to Visit  Medication Sig Dispense Refill  . albuterol (PROVENTIL HFA;VENTOLIN HFA) 108 (90 Base) MCG/ACT inhaler Inhale 2 puffs into the lungs every 6 (six) hours as needed for wheezing or shortness of breath.    Marland Kitchen ibuprofen (ADVIL,MOTRIN) 800 MG tablet Take 1 tablet (800 mg total) by mouth every 8 (eight) hours as needed. 30 tablet 0  . ipratropium (ATROVENT) 0.03 % nasal spray SPRAY TWO SQUIRTS INTO EACH NOSTRIL TWICE A DAY  1  . lidocaine-prilocaine (EMLA) cream Apply to affected area once 30 g 3  . LORazepam (ATIVAN) 0.5 MG tablet Take 1 tablet (0.5 mg total) at bedtime as needed by mouth for sleep. 30 tablet 0  . ondansetron (ZOFRAN) 8 MG tablet Take 1 tablet (8 mg total) 2 (two) times daily as needed by mouth (Nausea or vomiting). 30 tablet 1  . pantoprazole (PROTONIX) 40 MG tablet Take 40 mg by mouth daily.    . prochlorperazine (COMPAZINE) 10 MG tablet Take 1 tablet (10 mg total) every 6 (six) hours as needed  by mouth (Nausea or vomiting). 30 tablet 1  . SUMAtriptan (IMITREX) 50 MG tablet Take 50 mg by mouth daily as needed for migraine.  3  . tiZANidine (ZANAFLEX) 4 MG tablet Take 4 mg by mouth every 6 (six) hours as needed for muscle spasms.     No facility-administered medications prior to visit.     PAST MEDICAL HISTORY: Past Medical History:  Diagnosis Date  . Anxiety   . Asthma   . Cancer ([place]) 06/2017   right breast  . Dysrhythmia    noo meds  . Fibromyalgia   . GERD (gastroesophageal reflux disease)   . Hot flashes   . Migraine   .  Palpitations   . Pneumonia    with cavitation of left lower lobe  . Tuberculosis    exposure as a child, get checked frequently  . Uterine fibroid     PAST SURGICAL HISTORY: Past Surgical History:  Procedure Laterality Date  . ABDOMINAL HYSTERECTOMY    . TUBAL LIGATION  1993  . WISDOM TOOTH EXTRACTION      FAMILY HISTORY: Family History  Problem Relation Age of Onset  . Migraines Daughter   . Hypertension Mother   . Heart disease Mother   . Hypertension Father   . Heart disease Father   . Breast cancer Sister 84       deceased at age 75  . Migraines Son   . Hypertension Unknown   . Colon cancer Neg Hx   . Colon polyps Neg Hx   . Diabetes Neg Hx   . Kidney disease Neg Hx   . Liver disease Neg Hx   . Heart attack Neg Hx   . Stroke Neg Hx     SOCIAL HISTORY: Social History   Socioeconomic History  . Marital status: Married    Spouse name: Not on file  . Number of children: 2  . Years of education: 75  . Highest education level: Not on file  Social Needs  . Financial resource strain: Not on file  . Food insecurity - worry: Not on file  . Food insecurity - inability: Not on file  . Transportation needs - medical: Not on file  . Transportation needs - non-medical: Not on file  Occupational History  . Occupation: unemployed  Tobacco Use  . Smoking status: Former Smoker    Packs/day: 0.05    Types: Cigarettes    Last attempt to quit: 07/16/2017    Years since quitting: 0.0  . Smokeless tobacco: Never Used  . Tobacco comment: smokes 1cigarette per day and more if she is stressed  Substance and Sexual Activity  . Alcohol use: No    Alcohol/week: 0.0 oz  . Drug use: Yes    Types: Marijuana    Comment: occasional, for pain. states she hasnt used for a mont; 08/02/17 sometimes  . Sexual activity: Yes    Birth control/protection: Surgical  Other Topics Concern  . Not on file  Social History Narrative   On disability for heart condition.  Pt. Was non-specific.        Patient does not drink caffeine.   Patient is ambidextrous, but mostly right handed.    12th grade, some college courses      PHYSICAL EXAM  Vitals:   08/02/17 1018  BP: 102/71  Pulse: 65  Weight: 160 lb 6.4 oz (72.8 kg)   Body mass index is 26.69 kg/m.   MMSE - Mini Mental State Exam 08/02/2017 07/26/2016 04/25/2016  Orientation to time 4 5 4   Orientation to Place 5 3 5   Registration 3 3 3   Attention/ Calculation 0 5 2  Recall 3 2 1   Language- name 2 objects 2 2 2   Language- repeat 0 1 1  Language- follow 3 step command 3 3 3   Language- read & follow direction 1 1 1   Write a sentence 1 1 1   Copy design 0 1 0  Total score 22 27 23     Generalized: Well developed, in no acute distress   Neurological examination  Mentation: Alert oriented to time, place, history taking. Follows all commands speech and language fluent Cranial nerve II-XII: Pupils were equal round reactive to light. Extraocular movements were full, visual field were full on confrontational test. Facial sensation and strength were normal. Uvula tongue midline. Head turning and shoulder shrug  were normal and symmetric. Motor: The motor testing reveals 5 over 5 strength of all 4 extremities. Good symmetric motor tone is noted throughout.  Sensory: Sensory testing is intact to soft touch on all 4 extremities. No evidence of extinction is noted.  Coordination: Cerebellar testing reveals good finger-nose-finger and heel-to-shin bilaterally.  Gait and station: Gait is normal. Tandem gait is normal. Romberg is negative. No drift is seen.  Reflexes: Deep tendon reflexes are symmetric and normal bilaterally.   DIAGNOSTIC DATA (LABS, IMAGING, TESTING) - I reviewed patient records, labs, notes, testing and imaging myself where available.  Lab Results  Component Value Date   WBC 7.3 07/17/2017   HGB 12.4 07/17/2017   HCT 37.9 07/17/2017   MCV 81.0 07/17/2017   PLT 238 07/17/2017      Component Value  Date/Time   NA 137 07/17/2017 1512   K 4.4 07/17/2017 1512   CL 106 07/17/2017 1512   CO2 24 07/17/2017 1512   GLUCOSE 85 07/17/2017 1512   BUN 6 07/17/2017 1512   CREATININE 0.63 07/17/2017 1512   CREATININE 0.60 09/05/2013 1459   CALCIUM 9.1 07/17/2017 1512   PROT 6.9 07/17/2017 1512   ALBUMIN 4.1 07/17/2017 1512   AST 17 07/17/2017 1512   ALT 12 (L) 07/17/2017 1512   ALKPHOS 75 07/17/2017 1512   BILITOT 0.5 07/17/2017 1512   GFRNONAA >60 07/17/2017 1512   GFRNONAA >89 09/05/2013 1459   GFRAA >60 07/17/2017 1512   GFRAA >89 09/05/2013 1459   Lab Results  Component Value Date   CHOL 145 09/05/2013   HDL 46 09/05/2013   LDLCALC 84 09/05/2013   TRIG 76 09/05/2013   CHOLHDL 3.2 09/05/2013   Lab Results  Component Value Date   HGBA1C 5.0 08/09/2015   No results found for: CHENIDPO24 Lab Results  Component Value Date   TSH 0.906 03/23/2014      ASSESSMENT AND PLAN 46 y.o. year old female  has a past medical history of Anxiety, Asthma, Cancer ([place]) (06/2017), Dysrhythmia, Fibromyalgia, GERD (gastroesophageal reflux disease), Hot flashes, Migraine, Palpitations, Pneumonia, Tuberculosis, and Uterine fibroid. here with :   1.  Tension type headaches 2.  Memory disturbance 3.  Breast cancer  I have advised the patient that we could consider starting her on low-dose tizanidine to see if that offers any benefit for her headaches.  The patient states that she will discuss this with her oncologist.  If they are amenable she will let us know.  The patient's memory score today is 22 out of 30.  One year ago was 23 out of 46.  She did have neuropsychological testing but has  never gotten the results for this.  I will reach out to that provider to see if we can obtain those results.  Patient is advised that if her symptoms worsen or she develops new symptoms they should let us know.  She will follow-up in 6 months or sooner if needed.   Ward Givens, MSN, NP-C 08/02/2017, 10:42  AM [facility] [address] [place], [place] [address] [phone]

## 2017-08-03 ENCOUNTER — Encounter (HOSPITAL_COMMUNITY): Payer: Self-pay | Admitting: Cardiology

## 2017-08-03 ENCOUNTER — Ambulatory Visit (HOSPITAL_COMMUNITY)
Admission: RE | Admit: 2017-08-03 | Discharge: 2017-08-03 | Disposition: A | Discharge status: Home / Self Care | Payer: Medicaid Other | Source: Ambulatory Visit | Attending: Cardiology | Admitting: Cardiology

## 2017-08-03 ENCOUNTER — Ambulatory Visit (HOSPITAL_COMMUNITY): Payer: Medicaid Other

## 2017-08-03 ENCOUNTER — Ambulatory Visit (HOSPITAL_BASED_OUTPATIENT_CLINIC_OR_DEPARTMENT_OTHER)
Admission: RE | Admit: 2017-08-03 | Discharge: 2017-08-03 | Disposition: A | Discharge status: Home / Self Care | Payer: Medicaid Other | Source: Ambulatory Visit | Attending: Internal Medicine | Admitting: Internal Medicine

## 2017-08-03 VITALS — BP 108/78 | HR 68 | Wt 158.0 lb

## 2017-08-03 DIAGNOSIS — C50411 Malignant neoplasm of upper-outer quadrant of right female breast: Secondary | ICD-10-CM

## 2017-08-03 DIAGNOSIS — Z803 Family history of malignant neoplasm of breast: Secondary | ICD-10-CM | POA: Insufficient documentation

## 2017-08-03 DIAGNOSIS — Z8249 Family history of ischemic heart disease and other diseases of the circulatory system: Secondary | ICD-10-CM | POA: Diagnosis not present

## 2017-08-03 DIAGNOSIS — C50911 Malignant neoplasm of unspecified site of right female breast: Secondary | ICD-10-CM | POA: Diagnosis not present

## 2017-08-03 DIAGNOSIS — Z171 Estrogen receptor negative status [ER-]: Secondary | ICD-10-CM

## 2017-08-03 DIAGNOSIS — Z79899 Other long term (current) drug therapy: Secondary | ICD-10-CM | POA: Insufficient documentation

## 2017-08-03 DIAGNOSIS — Z87891 Personal history of nicotine dependence: Secondary | ICD-10-CM | POA: Insufficient documentation

## 2017-08-03 DIAGNOSIS — I471 Supraventricular tachycardia: Secondary | ICD-10-CM | POA: Insufficient documentation

## 2017-08-03 NOTE — Progress Notes (Signed)
  Echocardiogram 2D Echocardiogram has been performed.  Katherine [name] 08/03/2017, 9:56 AM

## 2017-08-03 NOTE — Patient Instructions (Signed)
Your physician has requested that you have an echocardiogram. Echocardiography is a painless test that uses sound waves to create images of your heart. It provides your doctor with information about the size and shape of your heart and how well your heart's chambers and valves are working. This procedure takes approximately one hour. There are no restrictions for this procedure.  Your physician recommends that you schedule a follow-up appointment in: 3 months with Dr. Aundra Dubin  and an echocardiogram

## 2017-08-04 NOTE — Progress Notes (Signed)
Oncology: Dr. Lindi Adie  46 yo was referred by Dr. Lindi Adie for cardio-oncology evaluation. She has breast cancer that was diagnosed on right in 10/18.  ER-/PR-/HER2+.  Right lumpectomy 10/18. She will have Taxol/Herceptin followed by Herceptin alone x 1 year.   She has a prior history of SVT but this seems to have been quiescent for years.  No palpitations or lightheadedness.    Symptomatically no major complaints.  No history of exertional chest pain or dyspnea.  No exercise intolerance.   PMH: 1. H/o SVT 2. Breast cancer: Diagnosed on right in 10/18.  ER-/PR-/HER2+.  Right lumpectomy 10/18. She will have Taxol/Herceptin followed by Herceptin alone x 1 year.  - Echo (11/18): EF 13-24%, normal diastolic function, GLS -40.1%.   Family History  Problem Relation Age of Onset  . Migraines Daughter   . Hypertension Mother   . Heart disease Mother   . Hypertension Father   . Heart disease Father   . Breast cancer Sister 64       deceased at age 46  . Migraines Son   . Hypertension Unknown   . Colon cancer Neg Hx   . Colon polyps Neg Hx   . Diabetes Neg Hx   . Kidney disease Neg Hx   . Liver disease Neg Hx   . Heart attack Neg Hx   . Stroke Neg Hx    Social History   Socioeconomic History  . Marital status: Married    Spouse name: None  . Number of children: 2  . Years of education: 53  . Highest education level: None  Social Needs  . Financial resource strain: None  . Food insecurity - worry: None  . Food insecurity - inability: None  . Transportation needs - medical: None  . Transportation needs - non-medical: None  Occupational History  . Occupation: unemployed  Tobacco Use  . Smoking status: Former Smoker    Packs/day: 0.05    Types: Cigarettes    Last attempt to quit: 07/16/2017    Years since quitting: 0.0  . Smokeless tobacco: Never Used  . Tobacco comment: smokes 1cigarette per day and more if she is stressed  Substance and Sexual Activity  . Alcohol use: No   Alcohol/week: 0.0 oz  . Drug use: Yes    Types: Marijuana    Comment: occasional, for pain. states she hasnt used for a mont; 08/02/17 sometimes  . Sexual activity: Yes    Birth control/protection: Surgical  Other Topics Concern  . None  Social History Narrative   On disability for heart condition.  Pt. Was non-specific.       Patient does not drink caffeine.   Patient is ambidextrous, but mostly right handed.    12th grade, some college courses   ROS: All systems reviewed and negative except as per HPI  Current Outpatient Medications  Medication Sig Dispense Refill  . albuterol (PROVENTIL HFA;VENTOLIN HFA) 108 (90 Base) MCG/ACT inhaler Inhale 2 puffs into the lungs every 6 (six) hours as needed for wheezing or shortness of breath.    Marland Kitchen ibuprofen (ADVIL,MOTRIN) 800 MG tablet Take 1 tablet (800 mg total) by mouth every 8 (eight) hours as needed. 30 tablet 0  . ipratropium (ATROVENT) 0.03 % nasal spray SPRAY TWO SQUIRTS INTO EACH NOSTRIL TWICE A DAY  1  . lidocaine-prilocaine (EMLA) cream Apply to affected area once 30 g 3  . LORazepam (ATIVAN) 0.5 MG tablet Take 1 tablet (0.5 mg total) at bedtime as needed by  mouth for sleep. 30 tablet 0  . ondansetron (ZOFRAN) 8 MG tablet Take 1 tablet (8 mg total) 2 (two) times daily as needed by mouth (Nausea or vomiting). 30 tablet 1  . pantoprazole (PROTONIX) 40 MG tablet Take 40 mg by mouth daily.    . prochlorperazine (COMPAZINE) 10 MG tablet Take 1 tablet (10 mg total) every 6 (six) hours as needed by mouth (Nausea or vomiting). 30 tablet 1  . SUMAtriptan (IMITREX) 50 MG tablet Take 50 mg by mouth daily as needed for migraine.  3  . tiZANidine (ZANAFLEX) 4 MG tablet Take 4 mg by mouth every 6 (six) hours as needed for muscle spasms.     No current facility-administered medications for this encounter.    BP 108/78   Pulse 68   Wt 158 lb (71.7 kg)   LMP 11/13/2016   SpO2 99%   BMI 26.29 kg/m  General: NAD Neck: No JVD, no thyromegaly or  thyroid nodule.  Lungs: Clear to auscultation bilaterally with normal respiratory effort. CV: Nondisplaced PMI.  Heart regular S1/S2, no S3/S4, no murmur.  No peripheral edema.  No carotid bruit.  Normal pedal pulses.  Abdomen: Soft, nontender, no hepatosplenomegaly, no distention.  Skin: Intact without lesions or rashes.  Neurologic: Alert and oriented x 3.  Psych: Normal affect. Extremities: No clubbing or cyanosis.  HEENT: Normal.   Assessment/Plan: 1. Breast cancer: Patient will be getting Herceptin-based treatment for a year.  She had baseline echo today.  I reviewed the echo,  EF and strain pattern are normal.  We discussed the risk of cardio-toxicity with Herceptin and the rationale behind screening echoes.  She will return in 3 months for her next echo and office visit.  2. SVT: Quiescent.  No meds.   Loralie Champagne 08/04/2017

## 2017-08-06 ENCOUNTER — Ambulatory Visit (HOSPITAL_COMMUNITY): Payer: Medicaid Other | Admitting: Dentistry

## 2017-08-06 ENCOUNTER — Encounter (HOSPITAL_COMMUNITY): Payer: Self-pay | Admitting: Dentistry

## 2017-08-06 VITALS — BP 108/68 | HR 59 | Temp 98.2°F

## 2017-08-06 DIAGNOSIS — K08409 Partial loss of teeth, unspecified cause, unspecified class: Secondary | ICD-10-CM

## 2017-08-06 DIAGNOSIS — Z01818 Encounter for other preprocedural examination: Secondary | ICD-10-CM

## 2017-08-06 DIAGNOSIS — C50411 Malignant neoplasm of upper-outer quadrant of right female breast: Secondary | ICD-10-CM

## 2017-08-06 DIAGNOSIS — M264 Malocclusion, unspecified: Secondary | ICD-10-CM

## 2017-08-06 DIAGNOSIS — K036 Deposits [accretions] on teeth: Secondary | ICD-10-CM

## 2017-08-06 DIAGNOSIS — K0530 Chronic periodontitis, unspecified: Secondary | ICD-10-CM

## 2017-08-06 DIAGNOSIS — K0850 Unsatisfactory restoration of tooth, unspecified: Secondary | ICD-10-CM

## 2017-08-06 DIAGNOSIS — Z171 Estrogen receptor negative status [ER-]: Principal | ICD-10-CM

## 2017-08-06 DIAGNOSIS — K06010 Localized gingival recession, unspecified: Secondary | ICD-10-CM

## 2017-08-06 DIAGNOSIS — K029 Dental caries, unspecified: Secondary | ICD-10-CM

## 2017-08-06 NOTE — Progress Notes (Signed)
DENTAL CONSULTATION  Date of Consultation:  08/06/2017 Patient Name:   Katherine [name] Date of Birth:   1971-07-19 Medical Record Number: [id number]  VITALS: BP 108/68 (BP Location: Left Arm)   Pulse (!) 59   Temp 98.2 F (36.8 C) (Oral)   LMP 11/13/2016   CHIEF COMPLAINT: Patient referred by Dr. Isidore Moos for a dental consultation.  HPI: Katherine [name] Is a 46 year old female recently diagnosed with right breast cancer. Patient is status post right lumpectomy with a sentinel node dissection with Dr. Brantley Stage on 07/24/2017. Patient with anticipated chemotherapy with Dr. Lindi Adie followed by adjuvant radiation therapy with Dr. Isidore Moos. Patient is now seen as part of a medically necessary pre-chemoradiation therapy dental protocol examination.  The patient currently denies acute toothaches, swellings, or abscesses. Patient does have intermittent earache symptoms involving the right ear. A consultation with her primary physician or an ear nose and throat specialist was recommended at this time.  The patient was last seen by dentist, Dr. Tasia Catchings, for an examination and radiographs in September 2017.  Patient had a discussion of treatment options at that time. A prior approval was sent to [facility] for scaling and root planing and was obtained in October 2017. Patient was contacted by Dr. Tasia Catchings for follow-up scaling and root planing but the patient did not call back for an appointment per Dr. Tasia Catchings. No subsequent treatment was provided at that time.  The patient does have a history of wearing a maxillary partial denture. This was fabricated approximately 10 years ago by patient report. Patient was never able to wear the partial denture to her satisfaction. Patient does not wear the partial denture at this time.patient denies having dental phobia but does have a "NEEDLE PHOBIA."  PROBLEM LIST: Patient Active Problem List   Diagnosis Date Noted  . Malignant neoplasm of upper-outer quadrant of right  breast in female, estrogen receptor negative ([place]) 07/03/2017    Priority: High  . Pelvic pain in female 12/21/2016  . Fibroids, intramural 12/21/2016  . Post-operative state 12/19/2016  . Nocturia 08/09/2015  . Fibromyalgia 06/25/2015  . GERD (gastroesophageal reflux disease) 06/16/2013  . Migraine 06/16/2013  . Atrial tachycardia, paroxysmal ([place]) 03/31/2013  . Headache 12/20/2012  . Syncope 06/20/2012  . Fibroids 03/26/2012  . Family history of breast cancer 01/22/2012  . Seasonal and perennial allergic rhinitis 07/18/2010  . HAIR LOSS 07/04/2010  . ANXIETY DISORDER, SITUATIONAL, MILD 11/17/2009  . CHEST PAIN, LEFT 10/12/2009  . DYSURIA 08/26/2009  . MAMMOGRAM, ABNORMAL, RIGHT, HX OF 04/09/2009  . TOBACCO ABUSE 03/17/2009  . DECREASED HEARING, RIGHT EAR 10/27/2008  . BACK PAIN, LUMBAR 06/06/2007  . HOT FLASHES 07/18/2006    PMH: Past Medical History:  Diagnosis Date  . Anxiety   . Asthma   . Dysrhythmia    History of SVT-No medications  . Fibromyalgia   . GERD (gastroesophageal reflux disease)   . Hot flashes   . Malignant neoplasm of upper-outer quadrant of right female breast ([place]) 06/2017   right breast  . Migraine   . Palpitations   . Pneumonia    with cavitation of left lower lobe  . Tuberculosis    exposure as a child, get checked frequently  . Uterine fibroid     PSH: Past Surgical History:  Procedure Laterality Date  . ABDOMINAL HYSTERECTOMY    . TUBAL LIGATION  1993  . WISDOM TOOTH EXTRACTION      ALLERGIES: Allergies  Allergen Reactions  . Morphine Anaphylaxis, Hives and Swelling  .  Shellfish Allergy Anaphylaxis  . Penicillins Other (See Comments)    Yeast infection  Has patient had a PCN reaction causing immediate rash, facial/tongue/throat swelling, SOB or lightheadedness with hypotension: no Has patient had a PCN reaction causing severe rash involving mucus membranes or skin necrosis: no Has patient had a PCN reaction that required  hospitalization no Has patient had a PCN reaction occurring within the last 10 years:no If all of the above answers are "NO", then may proceed with Cephalosporin use.   . Codeine Hives  . Hydrocodone Hives    Pt can take percocet   . Tomato Rash    MEDICATIONS: Current Outpatient Medications  Medication Sig Dispense Refill  . albuterol (PROVENTIL HFA;VENTOLIN HFA) 108 (90 Base) MCG/ACT inhaler Inhale 2 puffs into the lungs every 6 (six) hours as needed for wheezing or shortness of breath.    Marland Kitchen ibuprofen (ADVIL,MOTRIN) 800 MG tablet Take 1 tablet (800 mg total) by mouth every 8 (eight) hours as needed. 30 tablet 0  . ipratropium (ATROVENT) 0.03 % nasal spray SPRAY TWO SQUIRTS INTO EACH NOSTRIL TWICE A DAY  1  . lidocaine-prilocaine (EMLA) cream Apply to affected area once 30 g 3  . LORazepam (ATIVAN) 0.5 MG tablet Take 1 tablet (0.5 mg total) at bedtime as needed by mouth for sleep. 30 tablet 0  . ondansetron (ZOFRAN) 8 MG tablet Take 1 tablet (8 mg total) 2 (two) times daily as needed by mouth (Nausea or vomiting). 30 tablet 1  . pantoprazole (PROTONIX) 40 MG tablet Take 40 mg by mouth daily.    . prochlorperazine (COMPAZINE) 10 MG tablet Take 1 tablet (10 mg total) every 6 (six) hours as needed by mouth (Nausea or vomiting). 30 tablet 1  . SUMAtriptan (IMITREX) 50 MG tablet Take 50 mg by mouth daily as needed for migraine.  3  . tiZANidine (ZANAFLEX) 4 MG tablet Take 4 mg by mouth every 6 (six) hours as needed for muscle spasms.     No current facility-administered medications for this visit.     LABS: Lab Results  Component Value Date   WBC 7.3 07/17/2017   HGB 12.4 07/17/2017   HCT 37.9 07/17/2017   MCV 81.0 07/17/2017   PLT 238 07/17/2017      Component Value Date/Time   NA 137 07/17/2017 1512   K 4.4 07/17/2017 1512   CL 106 07/17/2017 1512   CO2 24 07/17/2017 1512   GLUCOSE 85 07/17/2017 1512   BUN 6 07/17/2017 1512   CREATININE 0.63 07/17/2017 1512   CREATININE  0.60 09/05/2013 1459   CALCIUM 9.1 07/17/2017 1512   GFRNONAA >60 07/17/2017 1512   GFRNONAA >89 09/05/2013 1459   GFRAA >60 07/17/2017 1512   GFRAA >89 09/05/2013 1459   No results found for: INR, PROTIME No results found for: PTT  SOCIAL HISTORY: Social History   Socioeconomic History  . Marital status: Married    Spouse name: Not on file  . Number of children: 2  . Years of education: 12  . Highest education level: Not on file  Social Needs  . Financial resource strain: Not on file  . Food insecurity - worry: Not on file  . Food insecurity - inability: Not on file  . Transportation needs - medical: Not on file  . Transportation needs - non-medical: Not on file  Occupational History  . Occupation: unemployed  Tobacco Use  . Smoking status: Former Smoker    Packs/day: 0.50    Types: Cigarettes  Last attempt to quit: 07/16/2017    Years since quitting: 0.0  . Smokeless tobacco: Never Used  . Tobacco comment: smokes 1cigarette per day and more if she is stressed  Substance and Sexual Activity  . Alcohol use: No    Alcohol/week: 0.0 oz  . Drug use: Yes    Types: Marijuana    Comment: occasional, for pain. states she hasnt used for a mont; 08/02/17 sometimes  . Sexual activity: Yes    Birth control/protection: Surgical  Other Topics Concern  . Not on file  Social History Narrative   On disability for heart condition.  Pt. Was non-specific.       Patient does not drink caffeine.   Patient is ambidextrous, but mostly right handed.    12th grade, some college courses    FAMILY HISTORY: Family History  Problem Relation Age of Onset  . Migraines Daughter   . Hypertension Mother   . Heart disease Mother   . Hypertension Father   . Heart disease Father   . Breast cancer Sister 32       deceased at age 63  . Migraines Son   . Hypertension Unknown   . Colon cancer Neg Hx   . Colon polyps Neg Hx   . Diabetes Neg Hx   . Kidney disease Neg Hx   . Liver disease  Neg Hx   . Heart attack Neg Hx   . Stroke Neg Hx     REVIEW OF SYSTEMS: Reviewed with the patient as per History of present illness. Psych: Patient denies having dental phobia but does have a "needle phobia".  DENTAL HISTORY: CHIEF COMPLAINT: Patient referred by Dr. Isidore Moos for a dental consultation.  HPI: Katherine [name] Is a 46 year old female recently diagnosed with right breast cancer. Patient is status post right lumpectomy with a sentinel node dissection with Dr. Brantley Stage on 07/24/2017. Patient with anticipated chemotherapy with Dr. Lindi Adie followed by adjuvant radiation therapy with Dr. Isidore Moos. Patient is now seen as part of a medically necessary pre-chemoradiation therapy dental protocol examination.  The patient currently denies acute toothaches, swellings, or abscesses. Patient does have intermittent earache symptoms involving the right ear. A consultation with her primary physician or an ear nose and throat specialist was recommended at this time.  The patient was last seen by dentist, Dr. Tasia Catchings, for an examination and radiographs in September 2017.  Patient had a discussion of treatment options at that time. A prior approval was sent to [facility] for scaling and root planing and was obtained in October 2017. Patient was contacted by Dr. Tasia Catchings for follow-up scaling and root planing but the patient did not call back for an appointment per Dr. Tasia Catchings. No subsequent treatment was provided at that time.  The patient does have a history of wearing a maxillary partial denture. This was fabricated approximately 10 years ago by patient report. Patient was never able to wear the partial denture to her satisfaction. Patient does not wear the partial denture at this time.patient denies having dental phobia but does have a "NEEDLE PHOBIA."  DENTAL EXAMINATION: GENERAL: The patient is a well-developed, well-nourished female in no acute distress. HEAD AND NECK:  There is no right or left neck  lymphadenopathy.  The patient has left TMJ subluxation on maximum opening. The patient denies acute TMJ symptoms. INTRAORAL EXAM:  The patient has normal saliva. The patient is a deep palatal vault. There is no evidence of oral abscess formation. DENTITION:  The patient is missing  tooth numbers 1,3-5, 12,13, 16,17, 19, and 32. PERIODONTAL:  The patient has chronic periodontitis with plaque and calculus accumulations, selective areas gingival recession, and incipient to moderate bone loss.  Please see periodontal charting form. DENTAL CARIES/SUBOPTIMAL RESTORATIONS:  Multiple dental caries and suboptimal dental restorations are noted as per dental charting form. ENDODONTIC:  The patient denies acute pulpitis symptoms. The patient does have extensive caries on the distal of #14 that may be impinging on the pulp. CROWN AND BRIDGE:  There are no crown or bridge restorations. Patient could ideally be evaluated for multiple crown and bridge restorations. PROSTHODONTIC:  The patient has a history of a maxillary partial denture that was ill fitting by patient report. This partial denture was fabricated approximately 10 years ago by patient report. OCCLUSION:  The patient has a poor occlusal scheme secondary to multiple missing teeth, supra-eruption and drifting of the unopposed teeth into the edentulous areas, and lack of replacement of the missing teeth with clinically acceptable dental prostheses.  RADIOGRAPHIC INTERPRETATION: An orthopantogram was taken today. This was supplemented with a full series of 13 periapical radiographs and 4 bitewings. There are multiple missing teeth. There is supra-eruption and drifting of the unopposed teeth into the edentulous areas. Multiple dental caries are noted. The dental caries on tooth #14 are extensive and may be impinging on the pulp. No obvious periapical radiolucencies are noted. Radiographic calculus is noted.  ASSESSMENTS: 1. Right breast cancer-status post  lumpectomy and sentinel node dissection 2. Pre-chemotherapy ration therapy dental protocol 3. Dental caries 4. Multiple suboptimal dental restorations 5. Chronic periodontitis with bone loss 6. Gingival recession 7. Accretions 8. Multiple missing teeth 9. Supra-eruption and drifting of the unopposed teeth into the edentulous areas 10. Poor occlusal scheme and malocclusion 11. History of ill fitting maxillary partial denture 12. Anticipated chemotherapy with Dr. Lindi Adie followed by adjuvant radiation therapy with Dr. Isidore Moos. 13. History of right earache symptoms  PLAN/RECOMMENDATIONS: 1. I discussed the risks, benefits, and complications of various treatment options with the patient in relationship to her medical and dental conditions, anticipated chemoradiation therapy, and chemoradiation therapy side effects to include xerostomia, dental caries, bleeding, and infection. We discussed various treatment options to include no treatment, extractions with alveoloplasty, pre-prosthetic surgery as indicated, periodontal therapy, dental restorations, root canal therapy, crown and bridge therapy, implant therapy, and replacement of missing teeth as indicated. The patient currently wishes to proceed with referral to an oral surgeon for extraction of tooth #14 with alveoloplasty. This consultation has been scheduled for November 20 at 2 PM with Dr. Benson Norway. The patient will also be referred back to her primary dentist, Dr. Tasia Catchings, for continued dental care as previously treatment planned. The completion of scaling and root planing and dental restorations will be emphasized initially, followed by the future fabrication of a maxillary partial denture after adequate healing.  2. Discussion of findings with medical team and coordination of future medical and dental care as needed. I will suggest referral to her primary physician or an ENT physician to evaluate a history of right earache for the past 6  months.  I spent in excess of  120 minutes during the conduct of this consultation and >50% of this time involved direct face-to-face encounter for counseling and/or coordination of the patient's care.    Lenn Cal, DDS

## 2017-08-07 LAB — HM MAMMOGRAPHY

## 2017-08-08 ENCOUNTER — Telehealth: Payer: Self-pay | Admitting: Licensed Clinical Social Worker

## 2017-08-08 ENCOUNTER — Telehealth: Payer: Self-pay | Admitting: Hematology and Oncology

## 2017-08-08 NOTE — Telephone Encounter (Signed)
Lvm advising appt 11/26 @ 10.45am.

## 2017-08-08 NOTE — Telephone Encounter (Signed)
CSW received referral to assist patient with PCP and Medicare D options. CSW attempted to reach patient and message left for return call. Raquel Sarna, [place], [place]

## 2017-08-09 ENCOUNTER — Telehealth: Payer: Self-pay | Admitting: Licensed Clinical Social Worker

## 2017-08-09 NOTE — Telephone Encounter (Signed)
Patient returned call and stated she has Medicaid which covers her medications and she is auto assigned a PCP. Patient denies any concerns with either and will call CSW if further needs arise. Raquel Sarna, [place], [place]

## 2017-08-17 ENCOUNTER — Other Ambulatory Visit: Payer: Self-pay | Admitting: *Deleted

## 2017-08-20 ENCOUNTER — Ambulatory Visit: Payer: Self-pay

## 2017-08-20 ENCOUNTER — Ambulatory Visit: Payer: Self-pay | Admitting: Hematology and Oncology

## 2017-08-20 ENCOUNTER — Other Ambulatory Visit: Payer: Self-pay

## 2017-08-23 ENCOUNTER — Telehealth: Payer: Self-pay | Admitting: Hematology and Oncology

## 2017-08-23 NOTE — Telephone Encounter (Signed)
Spoke to patient regarding upcoming appointments per 11/26 sch message.

## 2017-08-27 ENCOUNTER — Ambulatory Visit: Payer: Self-pay | Admitting: Hematology and Oncology

## 2017-08-27 ENCOUNTER — Ambulatory Visit: Payer: Self-pay

## 2017-08-27 ENCOUNTER — Other Ambulatory Visit: Payer: Self-pay

## 2017-08-28 ENCOUNTER — Telehealth: Payer: Self-pay | Admitting: Hematology and Oncology

## 2017-08-28 NOTE — Telephone Encounter (Signed)
08/28/2017 faxed medical records to [facility] @ [phone]

## 2017-09-03 ENCOUNTER — Ambulatory Visit: Payer: Self-pay

## 2017-09-03 ENCOUNTER — Ambulatory Visit: Payer: Medicaid Other | Admitting: Hematology and Oncology

## 2017-09-03 ENCOUNTER — Ambulatory Visit: Payer: Medicaid Other

## 2017-09-03 ENCOUNTER — Other Ambulatory Visit: Payer: Self-pay

## 2017-09-03 ENCOUNTER — Other Ambulatory Visit: Payer: Medicaid Other

## 2017-09-03 ENCOUNTER — Encounter: Payer: Self-pay | Admitting: Family Medicine

## 2017-09-07 ENCOUNTER — Encounter: Payer: Self-pay | Admitting: Hematology and Oncology

## 2017-09-07 ENCOUNTER — Telehealth: Payer: Self-pay | Admitting: Hematology and Oncology

## 2017-09-07 NOTE — Telephone Encounter (Signed)
Spoke to patient regarding upcoming December appointment updates.  °

## 2017-09-07 NOTE — Progress Notes (Signed)
Returned patient's call from a voicemail left regarding gas card.  Asked patient if she has applied for grant before. She states she has not and has not been able to reach me. Advised patient this was the first time I have received a voicemail from her. Patient states she has not previously left a message because she just got my number today.  Scheduled an appointment for her to meet with me on Mon 12/17@9 :30 before her appointments. Advised her to bring proof of income which could be bank statement or SSDI award letter. Patient states she is a household of 1 due to being separated.  She has my name and number for any additional financial questions or concerns.

## 2017-09-08 NOTE — Assessment & Plan Note (Signed)
07/24/2017:Right lumpectomy: IDC grade 3, 1.8 cm, 0/2 lymph nodes negative,ER 0%, PR 0%, Ki-67 40%, HER-2 positive ratio 3.59, T1CN0 stage I a  Pathology counseling: I discussed the final pathology report of the patient provided  a copy of this report. I discussed the margins as well as lymph node surgeries. We also discussed the final staging along with previously performed ER/PR and HER-2/neu testing.  Recommendation: 1.  Adjuvant chemotherapy with Taxol Herceptin followed by Herceptin maintenance for 1 year 2. followed by adjuvant radiation therapy ------------------------------------------------------------------------------------------------------------------------------------ Current treatment: Cycle 1 Taxol Herceptin Chemo consent obtained Chemo education completed Labs reviewed Antiemetics were reviewed Echocardiogram 08/03/2017: EF 60-65% Return to clinic in 1 week for toxicity check

## 2017-09-10 ENCOUNTER — Other Ambulatory Visit (HOSPITAL_BASED_OUTPATIENT_CLINIC_OR_DEPARTMENT_OTHER): Payer: Medicaid Other

## 2017-09-10 ENCOUNTER — Ambulatory Visit: Payer: Medicaid Other

## 2017-09-10 ENCOUNTER — Encounter: Payer: Self-pay | Admitting: Hematology and Oncology

## 2017-09-10 ENCOUNTER — Ambulatory Visit (HOSPITAL_BASED_OUTPATIENT_CLINIC_OR_DEPARTMENT_OTHER): Payer: Medicaid Other

## 2017-09-10 ENCOUNTER — Ambulatory Visit (HOSPITAL_BASED_OUTPATIENT_CLINIC_OR_DEPARTMENT_OTHER): Payer: Medicaid Other | Admitting: Hematology and Oncology

## 2017-09-10 ENCOUNTER — Encounter: Payer: Self-pay | Admitting: *Deleted

## 2017-09-10 VITALS — BP 113/87 | HR 59 | Temp 98.4°F | Resp 18

## 2017-09-10 DIAGNOSIS — C50411 Malignant neoplasm of upper-outer quadrant of right female breast: Secondary | ICD-10-CM | POA: Diagnosis not present

## 2017-09-10 DIAGNOSIS — Z171 Estrogen receptor negative status [ER-]: Secondary | ICD-10-CM | POA: Diagnosis not present

## 2017-09-10 DIAGNOSIS — Z5112 Encounter for antineoplastic immunotherapy: Secondary | ICD-10-CM | POA: Diagnosis not present

## 2017-09-10 DIAGNOSIS — Z5111 Encounter for antineoplastic chemotherapy: Secondary | ICD-10-CM

## 2017-09-10 DIAGNOSIS — Z95828 Presence of other vascular implants and grafts: Secondary | ICD-10-CM

## 2017-09-10 LAB — CBC WITH DIFFERENTIAL/PLATELET
BASO%: 0.7 % (ref 0.0–2.0)
Basophils Absolute: 0.1 10*3/uL (ref 0.0–0.1)
EOS%: 2.2 % (ref 0.0–7.0)
Eosinophils Absolute: 0.2 10*3/uL (ref 0.0–0.5)
HCT: 36.4 % (ref 34.8–46.6)
HGB: 11.9 g/dL (ref 11.6–15.9)
LYMPH#: 3.2 10*3/uL (ref 0.9–3.3)
LYMPH%: 43.8 % (ref 14.0–49.7)
MCH: 26.9 pg (ref 25.1–34.0)
MCHC: 32.9 g/dL (ref 31.5–36.0)
MCV: 81.8 fL (ref 79.5–101.0)
MONO#: 0.5 10*3/uL (ref 0.1–0.9)
MONO%: 6.9 % (ref 0.0–14.0)
NEUT%: 46.4 % (ref 38.4–76.8)
NEUTROS ABS: 3.4 10*3/uL (ref 1.5–6.5)
Platelets: 241 10*3/uL (ref 145–400)
RBC: 4.45 10*6/uL (ref 3.70–5.45)
RDW: 14.7 % — ABNORMAL HIGH (ref 11.2–14.5)
WBC: 7.4 10*3/uL (ref 3.9–10.3)

## 2017-09-10 LAB — COMPREHENSIVE METABOLIC PANEL
ALBUMIN: 4.1 g/dL (ref 3.5–5.0)
ALK PHOS: 92 U/L (ref 40–150)
ALT: 9 U/L (ref 0–55)
AST: 14 U/L (ref 5–34)
Anion Gap: 8 mEq/L (ref 3–11)
BILIRUBIN TOTAL: 0.31 mg/dL (ref 0.20–1.20)
BUN: 8.4 mg/dL (ref 7.0–26.0)
CALCIUM: 9.1 mg/dL (ref 8.4–10.4)
CO2: 25 mEq/L (ref 22–29)
Chloride: 106 mEq/L (ref 98–109)
Creatinine: 0.7 mg/dL (ref 0.6–1.1)
Glucose: 84 mg/dl (ref 70–140)
POTASSIUM: 3.9 meq/L (ref 3.5–5.1)
SODIUM: 139 meq/L (ref 136–145)
TOTAL PROTEIN: 7.4 g/dL (ref 6.4–8.3)

## 2017-09-10 MED ORDER — SODIUM CHLORIDE 0.9% FLUSH
10.0000 mL | INTRAVENOUS | Status: DC | PRN
  Administered 2017-09-10: 10 mL
  Filled 2017-09-10: qty 10

## 2017-09-10 MED ORDER — DIPHENHYDRAMINE HCL 50 MG/ML IJ SOLN
50.0000 mg | Freq: Once | INTRAMUSCULAR | Status: AC
  Administered 2017-09-10: 50 mg via INTRAVENOUS

## 2017-09-10 MED ORDER — FAMOTIDINE IN NACL 20-0.9 MG/50ML-% IV SOLN
INTRAVENOUS | Status: AC
  Filled 2017-09-10: qty 50

## 2017-09-10 MED ORDER — ACETAMINOPHEN 325 MG PO TABS
650.0000 mg | ORAL_TABLET | Freq: Once | ORAL | Status: AC
  Administered 2017-09-10: 650 mg via ORAL

## 2017-09-10 MED ORDER — ACETAMINOPHEN 325 MG PO TABS
ORAL_TABLET | ORAL | Status: AC
  Filled 2017-09-10: qty 2

## 2017-09-10 MED ORDER — HEPARIN SOD (PORK) LOCK FLUSH 100 UNIT/ML IV SOLN
500.0000 [IU] | Freq: Once | INTRAVENOUS | Status: AC | PRN
  Administered 2017-09-10: 500 [IU]
  Filled 2017-09-10: qty 5

## 2017-09-10 MED ORDER — SODIUM CHLORIDE 0.9 % IV SOLN
300.0000 mg | Freq: Once | INTRAVENOUS | Status: AC
  Administered 2017-09-10: 300 mg via INTRAVENOUS
  Filled 2017-09-10: qty 14.29

## 2017-09-10 MED ORDER — SODIUM CHLORIDE 0.9% FLUSH
10.0000 mL | INTRAVENOUS | Status: DC | PRN
  Administered 2017-09-10: 10 mL via INTRAVENOUS
  Filled 2017-09-10: qty 10

## 2017-09-10 MED ORDER — FAMOTIDINE IN NACL 20-0.9 MG/50ML-% IV SOLN
20.0000 mg | Freq: Once | INTRAVENOUS | Status: AC
  Administered 2017-09-10: 20 mg via INTRAVENOUS

## 2017-09-10 MED ORDER — SODIUM CHLORIDE 0.9 % IV SOLN
20.0000 mg | Freq: Once | INTRAVENOUS | Status: AC
  Administered 2017-09-10: 20 mg via INTRAVENOUS
  Filled 2017-09-10: qty 2

## 2017-09-10 MED ORDER — SODIUM CHLORIDE 0.9 % IV SOLN
80.0000 mg/m2 | Freq: Once | INTRAVENOUS | Status: AC
  Administered 2017-09-10: 144 mg via INTRAVENOUS
  Filled 2017-09-10: qty 24

## 2017-09-10 MED ORDER — DIPHENHYDRAMINE HCL 50 MG/ML IJ SOLN
INTRAMUSCULAR | Status: AC
  Filled 2017-09-10: qty 1

## 2017-09-10 MED ORDER — SODIUM CHLORIDE 0.9 % IV SOLN
Freq: Once | INTRAVENOUS | Status: AC
  Administered 2017-09-10: 14:00:00 via INTRAVENOUS

## 2017-09-10 NOTE — Patient Instructions (Signed)
[facility] Discharge Instructions for Patients Receiving Chemotherapy  Today you received the following chemotherapy agents: Paclitaxel (Taxol) and Herceptin (Trastuzumab)  To help prevent nausea and vomiting after your treatment, we encourage you to take your nausea medication as prescribed by your doctor.   If you develop nausea and vomiting that is not controlled by your nausea medication, call the clinic.   BELOW ARE SYMPTOMS THAT SHOULD BE REPORTED IMMEDIATELY:  *FEVER GREATER THAN 100.5 F  *CHILLS WITH OR WITHOUT FEVER  NAUSEA AND VOMITING THAT IS NOT CONTROLLED WITH YOUR NAUSEA MEDICATION  *UNUSUAL SHORTNESS OF BREATH  *UNUSUAL BRUISING OR BLEEDING  TENDERNESS IN MOUTH AND THROAT WITH OR WITHOUT PRESENCE OF ULCERS  *URINARY PROBLEMS  *BOWEL PROBLEMS  UNUSUAL RASH Items with * indicate a potential emergency and should be followed up as soon as possible.  Feel free to call the clinic should you have any questions or concerns. The clinic phone number is [phone].  Please show the [facility] at check-in to the Emergency Department and triage nurse.    Trastuzumab injection for infusion What is this medicine? TRASTUZUMAB (tras TOO zoo mab) is a monoclonal antibody. It is used to treat breast cancer and stomach cancer. This medicine may be used for other purposes; ask your health care provider or pharmacist if you have questions. COMMON BRAND NAME(S): Herceptin What should I tell my health care provider before I take this medicine? They need to know if you have any of these conditions: -heart disease -heart failure -lung or breathing disease, like asthma -an unusual or allergic reaction to trastuzumab, benzyl alcohol, or other medications, foods, dyes, or preservatives -pregnant or trying to get pregnant -breast-feeding How should I use this medicine? This drug is given as an infusion into a vein. It is administered in a hospital or clinic  by a specially trained health care professional. Talk to your pediatrician regarding the use of this medicine in children. This medicine is not approved for use in children. Overdosage: If you think you have taken too much of this medicine contact a poison control center or emergency room at once. NOTE: This medicine is only for you. Do not share this medicine with others. What if I miss a dose? It is important not to miss a dose. Call your doctor or health care professional if you are unable to keep an appointment. What may interact with this medicine? This medicine may interact with the following medications: -certain types of chemotherapy, such as daunorubicin, doxorubicin, epirubicin, and idarubicin This list may not describe all possible interactions. Give your health care provider a list of all the medicines, herbs, non-prescription drugs, or dietary supplements you use. Also tell them if you smoke, drink alcohol, or use illegal drugs. Some items may interact with your medicine. What should I watch for while using this medicine? Visit your doctor for checks on your progress. Report any side effects. Continue your course of treatment even though you feel ill unless your doctor tells you to stop. Call your doctor or health care professional for advice if you get a fever, chills or sore throat, or other symptoms of a cold or flu. Do not treat yourself. Try to avoid being around people who are sick. You may experience fever, chills and shaking during your first infusion. These effects are usually mild and can be treated with other medicines. Report any side effects during the infusion to your health care professional. Fever and chills usually do not  happen with later infusions. Do not become pregnant while taking this medicine or for 7 months after stopping it. Women should inform their doctor if they wish to become pregnant or think they might be pregnant. Women of child-bearing potential will need  to have a negative pregnancy test before starting this medicine. There is a potential for serious side effects to an unborn child. Talk to your health care professional or pharmacist for more information. Do not breast-feed an infant while taking this medicine or for 7 months after stopping it. Women must use effective birth control with this medicine. What side effects may I notice from receiving this medicine? Side effects that you should report to your doctor or health care professional as soon as possible: -allergic reactions like skin rash, itching or hives, swelling of the face, lips, or tongue -chest pain or palpitations -cough -dizziness -feeling faint or lightheaded, falls -fever -general ill feeling or flu-like symptoms -signs of worsening heart failure like breathing problems; swelling in your legs and feet -unusually weak or tired Side effects that usually do not require medical attention (report to your doctor or health care professional if they continue or are bothersome): -bone pain -changes in taste -diarrhea -joint pain -nausea/vomiting -weight loss This list may not describe all possible side effects. Call your doctor for medical advice about side effects. You may report side effects to FDA at 1-800-FDA-1088. Where should I keep my medicine? This drug is given in a hospital or clinic and will not be stored at home. NOTE: This sheet is a summary. It may not cover all possible information. If you have questions about this medicine, talk to your doctor, pharmacist, or health care provider.  2018 Elsevier/Gold Standard (2016-09-05 14:37:52)   Paclitaxel injection What is this medicine? PACLITAXEL (PAK li TAX el) is a chemotherapy drug. It targets fast dividing cells, like cancer cells, and causes these cells to die. This medicine is used to treat ovarian cancer, breast cancer, and other cancers. This medicine may be used for other purposes; ask your health care provider  or pharmacist if you have questions. COMMON BRAND NAME(S): Onxol, Taxol What should I tell my health care provider before I take this medicine? They need to know if you have any of these conditions: -blood disorders -irregular heartbeat -infection (especially a virus infection such as chickenpox, cold sores, or herpes) -liver disease -previous or ongoing radiation therapy -an unusual or allergic reaction to paclitaxel, alcohol, polyoxyethylated castor oil, other chemotherapy agents, other medicines, foods, dyes, or preservatives -pregnant or trying to get pregnant -breast-feeding How should I use this medicine? This drug is given as an infusion into a vein. It is administered in a hospital or clinic by a specially trained health care professional. Talk to your pediatrician regarding the use of this medicine in children. Special care may be needed. Overdosage: If you think you have taken too much of this medicine contact a poison control center or emergency room at once. NOTE: This medicine is only for you. Do not share this medicine with others. What if I miss a dose? It is important not to miss your dose. Call your doctor or health care professional if you are unable to keep an appointment. What may interact with this medicine? Do not take this medicine with any of the following medications: -disulfiram -metronidazole This medicine may also interact with the following medications: -cyclosporine -diazepam -ketoconazole -medicines to increase blood counts like filgrastim, pegfilgrastim, sargramostim -other chemotherapy drugs like cisplatin, doxorubicin, epirubicin,  etoposide, teniposide, vincristine -quinidine -testosterone -vaccines -verapamil Talk to your doctor or health care professional before taking any of these medicines: -acetaminophen -aspirin -ibuprofen -ketoprofen -naproxen This list may not describe all possible interactions. Give your health care provider a list of  all the medicines, herbs, non-prescription drugs, or dietary supplements you use. Also tell them if you smoke, drink alcohol, or use illegal drugs. Some items may interact with your medicine. What should I watch for while using this medicine? Your condition will be monitored carefully while you are receiving this medicine. You will need important blood work done while you are taking this medicine. This medicine can cause serious allergic reactions. To reduce your risk you will need to take other medicine(s) before treatment with this medicine. If you experience allergic reactions like skin rash, itching or hives, swelling of the face, lips, or tongue, tell your doctor or health care professional right away. In some cases, you may be given additional medicines to help with side effects. Follow all directions for their use. This drug may make you feel generally unwell. This is not uncommon, as chemotherapy can affect healthy cells as well as cancer cells. Report any side effects. Continue your course of treatment even though you feel ill unless your doctor tells you to stop. Call your doctor or health care professional for advice if you get a fever, chills or sore throat, or other symptoms of a cold or flu. Do not treat yourself. This drug decreases your body's ability to fight infections. Try to avoid being around people who are sick. This medicine may increase your risk to bruise or bleed. Call your doctor or health care professional if you notice any unusual bleeding. Be careful brushing and flossing your teeth or using a toothpick because you may get an infection or bleed more easily. If you have any dental work done, tell your dentist you are receiving this medicine. Avoid taking products that contain aspirin, acetaminophen, ibuprofen, naproxen, or ketoprofen unless instructed by your doctor. These medicines may hide a fever. Do not become pregnant while taking this medicine. Women should inform their  doctor if they wish to become pregnant or think they might be pregnant. There is a potential for serious side effects to an unborn child. Talk to your health care professional or pharmacist for more information. Do not breast-feed an infant while taking this medicine. Men are advised not to father a child while receiving this medicine. This product may contain alcohol. Ask your pharmacist or healthcare provider if this medicine contains alcohol. Be sure to tell all healthcare providers you are taking this medicine. Certain medicines, like metronidazole and disulfiram, can cause an unpleasant reaction when taken with alcohol. The reaction includes flushing, headache, nausea, vomiting, sweating, and increased thirst. The reaction can last from 30 minutes to several hours. What side effects may I notice from receiving this medicine? Side effects that you should report to your doctor or health care professional as soon as possible: -allergic reactions like skin rash, itching or hives, swelling of the face, lips, or tongue -low blood counts - This drug may decrease the number of white blood cells, red blood cells and platelets. You may be at increased risk for infections and bleeding. -signs of infection - fever or chills, cough, sore throat, pain or difficulty passing urine -signs of decreased platelets or bleeding - bruising, pinpoint red spots on the skin, black, tarry stools, nosebleeds -signs of decreased red blood cells - unusually weak or tired, fainting  spells, lightheadedness -breathing problems -chest pain -high or low blood pressure -mouth sores -nausea and vomiting -pain, swelling, redness or irritation at the injection site -pain, tingling, numbness in the hands or feet -slow or irregular heartbeat -swelling of the ankle, feet, hands Side effects that usually do not require medical attention (report to your doctor or health care professional if they continue or are bothersome): -bone  pain -complete hair loss including hair on your head, underarms, pubic hair, eyebrows, and eyelashes -changes in the color of fingernails -diarrhea -loosening of the fingernails -loss of appetite -muscle or joint pain -red flush to skin -sweating This list may not describe all possible side effects. Call your doctor for medical advice about side effects. You may report side effects to FDA at 1-800-FDA-1088. Where should I keep my medicine? This drug is given in a hospital or clinic and will not be stored at home. NOTE: This sheet is a summary. It may not cover all possible information. If you have questions about this medicine, talk to your doctor, pharmacist, or health care provider.  2018 Elsevier/Gold Standard (2015-07-13 19:58:00)

## 2017-09-10 NOTE — Progress Notes (Signed)
Pt tolerated herceptin and taxol first time infusion. Encouraged pt to drink plenty of fluids and reinforced chemo management at home and when to call MD or go to ED for worsening symptoms. Follow up chemo call placed. Pt discharged in no acute distress.

## 2017-09-10 NOTE — Progress Notes (Signed)
Patient Care Team: [name], MD as PCP - General (Family Medicine) Erroll Luna, MD as Consulting Physician (General Surgery)  DIAGNOSIS:  Encounter Diagnosis  Name Primary?  . Malignant neoplasm of upper-outer quadrant of right breast in female, estrogen receptor negative ([facility])     SUMMARY OF ONCOLOGIC HISTORY:   Malignant neoplasm of upper-outer quadrant of right breast in female, estrogen receptor negative ([facility])   06/26/2017 Initial Diagnosis    Right breast biopsy 8:30 position 8 cm from nipple: IDC with DCIS, second biopsy 6 cm from nipple fibrocystic changes: Grade 3, ER 0%, PR 0%, Ki-67 40%, HER-2 positive ratio 3.59; mammogram and ultrasound revealed 1.9 cm mass at 8:30 position, adjacent 0.7 cm cystic mass, T1c N0 stage IA AJCC 8       07/24/2017 Surgery    Right lumpectomy: IDC grade 3, 1.8 cm, 0/2 lymph nodes negative,ER 0%, PR 0%, Ki-67 40%, HER-2 positive ratio 3.59, T1CN0 stage I a       09/10/2017 -  Chemotherapy    Taxol Herceptin weekly x12 followed by Herceptin maintenance for 1 year       CHIEF COMPLIANT: Cycle 1 Taxol Herceptin  INTERVAL HISTORY: Katherine [name] is a 46 year old with above-mentioned history of right breast cancer treated with lumpectomy and is now on adjuvant chemotherapy with Taxol Herceptin.  Today is cycle 1 of her treatment.  She is anxious to begin the treatment.  Denies any nausea vomiting.  REVIEW OF SYSTEMS:   Constitutional: Denies fevers, chills or abnormal weight loss Eyes: Denies blurriness of vision Ears, nose, mouth, throat, and face: Denies mucositis or sore throat Respiratory: Denies cough, dyspnea or wheezes Cardiovascular: Denies palpitation, chest discomfort Gastrointestinal:  Denies nausea, heartburn or change in bowel habits Skin: Denies abnormal skin rashes Lymphatics: Denies new lymphadenopathy or easy bruising Neurological:Denies numbness, tingling or new weaknesses Behavioral/Psych: Mood is stable, no  new changes  Extremities: No lower extremity edema All other systems were reviewed with the patient and are negative.  I have reviewed the past medical history, past surgical history, social history and family history with the patient and they are unchanged from previous note.  ALLERGIES:  is allergic to morphine; shellfish allergy; penicillins; codeine; hydrocodone; and tomato.  MEDICATIONS:  Current Outpatient Medications  Medication Sig Dispense Refill  . albuterol (PROVENTIL HFA;VENTOLIN HFA) 108 (90 Base) MCG/ACT inhaler Inhale 2 puffs into the lungs every 6 (six) hours as needed for wheezing or shortness of breath.    Marland Kitchen ibuprofen (ADVIL,MOTRIN) 800 MG tablet Take 1 tablet (800 mg total) by mouth every 8 (eight) hours as needed. 30 tablet 0  . ipratropium (ATROVENT) 0.03 % nasal spray SPRAY TWO SQUIRTS INTO EACH NOSTRIL TWICE A DAY  1  . lidocaine-prilocaine (EMLA) cream Apply to affected area once 30 g 3  . LORazepam (ATIVAN) 0.5 MG tablet Take 1 tablet (0.5 mg total) at bedtime as needed by mouth for sleep. 30 tablet 0  . ondansetron (ZOFRAN) 8 MG tablet Take 1 tablet (8 mg total) 2 (two) times daily as needed by mouth (Nausea or vomiting). 30 tablet 1  . pantoprazole (PROTONIX) 40 MG tablet Take 40 mg by mouth daily.    . prochlorperazine (COMPAZINE) 10 MG tablet Take 1 tablet (10 mg total) every 6 (six) hours as needed by mouth (Nausea or vomiting). 30 tablet 1  . SUMAtriptan (IMITREX) 50 MG tablet Take 50 mg by mouth daily as needed for migraine.  3  . tiZANidine (ZANAFLEX) 4 MG  tablet Take 4 mg by mouth every 6 (six) hours as needed for muscle spasms.     No current facility-administered medications for this visit.     PHYSICAL EXAMINATION: ECOG PERFORMANCE STATUS: 1 - Symptomatic but completely ambulatory  Vitals:   09/10/17 1036  BP: 129/80  Pulse: 61  Resp: 18  Temp: 98.6 F (37 C)  SpO2: 100%   Filed Weights   09/10/17 1036  Weight: 158 lb 14.4 oz (72.1 kg)     GENERAL:alert, no distress and comfortable SKIN: skin color, texture, turgor are normal, no rashes or significant lesions EYES: normal, Conjunctiva are pink and non-injected, sclera clear OROPHARYNX:no exudate, no erythema and lips, buccal mucosa, and tongue normal  NECK: supple, thyroid normal size, non-tender, without nodularity LYMPH:  no palpable lymphadenopathy in the cervical, axillary or inguinal LUNGS: clear to auscultation and percussion with normal breathing effort HEART: regular rate & rhythm and no murmurs and no lower extremity edema ABDOMEN:abdomen soft, non-tender and normal bowel sounds MUSCULOSKELETAL:no cyanosis of digits and no clubbing  NEURO: alert & oriented x 3 with fluent speech, no focal motor/sensory deficits EXTREMITIES: No lower extremity edema   LABORATORY DATA:  I have reviewed the data as listed   Chemistry      Component Value Date/Time   NA 139 09/10/2017 1006   K 3.9 09/10/2017 1006   CL 106 07/17/2017 1512   CO2 25 09/10/2017 1006   BUN 8.4 09/10/2017 1006   CREATININE 0.7 09/10/2017 1006      Component Value Date/Time   CALCIUM 9.1 09/10/2017 1006   ALKPHOS 92 09/10/2017 1006   AST 14 09/10/2017 1006   ALT 9 09/10/2017 1006   BILITOT 0.31 09/10/2017 1006       Lab Results  Component Value Date   WBC 7.4 09/10/2017   HGB 11.9 09/10/2017   HCT 36.4 09/10/2017   MCV 81.8 09/10/2017   PLT 241 09/10/2017   NEUTROABS 3.4 09/10/2017    ASSESSMENT & PLAN:  Malignant neoplasm of upper-outer quadrant of right breast in female, estrogen receptor negative ([facility]) 07/24/2017:Right lumpectomy: IDC grade 3, 1.8 cm, 0/2 lymph nodes negative,ER 0%, PR 0%, Ki-67 40%, HER-2 positive ratio 3.59, T1CN0 stage I a  Pathology counseling: I discussed the final pathology report of the patient provided  a copy of this report. I discussed the margins as well as lymph node surgeries. We also discussed the final staging along with previously performed ER/PR  and HER-2/neu testing.  Recommendation: 1.  Adjuvant chemotherapy with Taxol Herceptin followed by Herceptin maintenance for 1 year 2. followed by adjuvant radiation therapy ------------------------------------------------------------------------------------------------------------------------------------ Current treatment: Cycle 1 Taxol Herceptin Chemo consent obtained Chemo education completed Labs reviewed Antiemetics were reviewed Echocardiogram 08/03/2017: EF 60-65% Return to clinic in 1 week for toxicity check   I spent 25 minutes talking to the patient of which more than half was spent in counseling and coordination of care.  No orders of the defined types were placed in this encounter.  The patient has a good understanding of the overall plan. she agrees with it. she will call with any problems that may develop before the next visit here.   Rulon Eisenmenger, MD 09/10/17

## 2017-09-10 NOTE — Patient Instructions (Signed)
Implanted Port Home Guide An implanted port is a type of central line that is placed under the skin. Central lines are used to provide IV access when treatment or nutrition needs to be given through a person's veins. Implanted ports are used for long-term IV access. An implanted port may be placed because:  You need IV medicine that would be irritating to the small veins in your hands or arms.  You need long-term IV medicines, such as antibiotics.  You need IV nutrition for a long period.  You need frequent blood draws for lab tests.  You need dialysis.  Implanted ports are usually placed in the chest area, but they can also be placed in the upper arm, the abdomen, or the leg. An implanted port has two main parts:  Reservoir. The reservoir is round and will appear as a small, raised area under your skin. The reservoir is the part where a needle is inserted to give medicines or draw blood.  Catheter. The catheter is a thin, flexible tube that extends from the reservoir. The catheter is placed into a large vein. Medicine that is inserted into the reservoir goes into the catheter and then into the vein.  How will I care for my incision site? Do not get the incision site wet. Bathe or shower as directed by your health care provider. How is my port accessed? Special steps must be taken to access the port:  Before the port is accessed, a numbing cream can be placed on the skin. This helps numb the skin over the port site.  Your health care provider uses a sterile technique to access the port. ? Your health care provider must put on a mask and sterile gloves. ? The skin over your port is cleaned carefully with an antiseptic and allowed to dry. ? The port is gently pinched between sterile gloves, and a needle is inserted into the port.  Only "non-coring" port needles should be used to access the port. Once the port is accessed, a blood return should be checked. This helps ensure that the port  is in the vein and is not clogged.  If your port needs to remain accessed for a constant infusion, a clear (transparent) bandage will be placed over the needle site. The bandage and needle will need to be changed every week, or as directed by your health care provider.  Keep the bandage covering the needle clean and dry. Do not get it wet. Follow your health care provider's instructions on how to take a shower or bath while the port is accessed.  If your port does not need to stay accessed, no bandage is needed over the port.  What is flushing? Flushing helps keep the port from getting clogged. Follow your health care provider's instructions on how and when to flush the port. Ports are usually flushed with saline solution or a medicine called heparin. The need for flushing will depend on how the port is used.  If the port is used for intermittent medicines or blood draws, the port will need to be flushed: ? After medicines have been given. ? After blood has been drawn. ? As part of routine maintenance.  If a constant infusion is running, the port may not need to be flushed.  How long will my port stay implanted? The port can stay in for as long as your health care provider thinks it is needed. When it is time for the port to come out, surgery will be   done to remove it. The procedure is similar to the one performed when the port was put in. When should I seek immediate medical care? When you have an implanted port, you should seek immediate medical care if:  You notice a bad smell coming from the incision site.  You have swelling, redness, or drainage at the incision site.  You have more swelling or pain at the port site or the surrounding area.  You have a fever that is not controlled with medicine.  This information is not intended to replace advice given to you by your health care provider. Make sure you discuss any questions you have with your health care provider. Document  Released: 09/11/2005 Document Revised: 02/17/2016 Document Reviewed: 05/19/2013 Elsevier Interactive Patient Education  2017 [employer]  

## 2017-09-10 NOTE — Progress Notes (Signed)
Met with patient to introduce myself in person and to obtain her proof of income. Patient approved for the one-time $1000 [employer]. Patient has a copy of the approval letter as well as the expense sheet along with the outpatient pharmacy information. Explained in detail how grant works. Patient received a gas card today and gave me her light bill to submit for payment. Patient has my card for any additional financial questions or concerns.  Patient asked about wigs or head coverings. Gave patient an application for [employer] to complete and return to me. Also advised that she may call the number if needed sooner.  Patient will complete and give me application back to fax.

## 2017-09-11 ENCOUNTER — Telehealth: Payer: Self-pay

## 2017-09-11 NOTE — Telephone Encounter (Signed)
Follow up call to patient after first treatment yesterday. Pt is doing well other then some tingling in her lower extremities. I will make Dr. Lindi Adie aware. Informed pt to call us if it continues or gets worse. No further concerns at this time.  Cyndia Bent RN

## 2017-09-12 ENCOUNTER — Telehealth: Payer: Self-pay | Admitting: Hematology and Oncology

## 2017-09-12 NOTE — Telephone Encounter (Signed)
Per 12/17 los - patient is scheduled for next three rounds of treatment.

## 2017-09-17 ENCOUNTER — Other Ambulatory Visit (HOSPITAL_BASED_OUTPATIENT_CLINIC_OR_DEPARTMENT_OTHER): Payer: Medicaid Other

## 2017-09-17 ENCOUNTER — Ambulatory Visit (HOSPITAL_BASED_OUTPATIENT_CLINIC_OR_DEPARTMENT_OTHER): Payer: Medicaid Other

## 2017-09-17 ENCOUNTER — Ambulatory Visit: Payer: Medicaid Other

## 2017-09-17 VITALS — BP 126/83 | HR 59 | Temp 98.5°F | Resp 17

## 2017-09-17 DIAGNOSIS — Z5112 Encounter for antineoplastic immunotherapy: Secondary | ICD-10-CM | POA: Diagnosis not present

## 2017-09-17 DIAGNOSIS — C50411 Malignant neoplasm of upper-outer quadrant of right female breast: Secondary | ICD-10-CM

## 2017-09-17 DIAGNOSIS — Z5111 Encounter for antineoplastic chemotherapy: Secondary | ICD-10-CM

## 2017-09-17 DIAGNOSIS — Z171 Estrogen receptor negative status [ER-]: Principal | ICD-10-CM

## 2017-09-17 DIAGNOSIS — Z95828 Presence of other vascular implants and grafts: Secondary | ICD-10-CM

## 2017-09-17 LAB — COMPREHENSIVE METABOLIC PANEL
ALT: 13 U/L (ref 0–55)
AST: 12 U/L (ref 5–34)
Albumin: 4 g/dL (ref 3.5–5.0)
Alkaline Phosphatase: 80 U/L (ref 40–150)
Anion Gap: 8 mEq/L (ref 3–11)
BUN: 7.6 mg/dL (ref 7.0–26.0)
CO2: 25 meq/L (ref 22–29)
CREATININE: 0.7 mg/dL (ref 0.6–1.1)
Calcium: 9 mg/dL (ref 8.4–10.4)
Chloride: 107 mEq/L (ref 98–109)
EGFR: >60 mL/min/{1.73_m2} (ref >60)
GLUCOSE: 102 mg/dL (ref 70–140)
Potassium: 4.2 mEq/L (ref 3.5–5.1)
SODIUM: 139 meq/L (ref 136–145)
TOTAL PROTEIN: 6.8 g/dL (ref 6.4–8.3)

## 2017-09-17 LAB — CBC WITH DIFFERENTIAL/PLATELET
BASO%: 0.4 % (ref 0.0–2.0)
BASOS ABS: 0 10*3/uL (ref 0.0–0.1)
EOS%: 2 % (ref 0.0–7.0)
Eosinophils Absolute: 0.1 10*3/uL (ref 0.0–0.5)
HEMATOCRIT: 34.2 % — AB (ref 34.8–46.6)
HGB: 11 g/dL — ABNORMAL LOW (ref 11.6–15.9)
LYMPH#: 3.4 10*3/uL — AB (ref 0.9–3.3)
LYMPH%: 52 % — ABNORMAL HIGH (ref 14.0–49.7)
MCH: 26.5 pg (ref 25.1–34.0)
MCHC: 32.1 g/dL (ref 31.5–36.0)
MCV: 82.4 fL (ref 79.5–101.0)
MONO#: 0.3 10*3/uL (ref 0.1–0.9)
MONO%: 4.5 % (ref 0.0–14.0)
NEUT#: 2.7 10*3/uL (ref 1.5–6.5)
NEUT%: 41.1 % (ref 38.4–76.8)
Platelets: 205 10*3/uL (ref 145–400)
RBC: 4.15 10*6/uL (ref 3.70–5.45)
RDW: 14.3 % (ref 11.2–14.5)
WBC: 6.5 10*3/uL (ref 3.9–10.3)

## 2017-09-17 MED ORDER — DIPHENHYDRAMINE HCL 50 MG/ML IJ SOLN
50.0000 mg | Freq: Once | INTRAMUSCULAR | Status: AC
  Administered 2017-09-17: 50 mg via INTRAVENOUS

## 2017-09-17 MED ORDER — SODIUM CHLORIDE 0.9 % IV SOLN
80.0000 mg/m2 | Freq: Once | INTRAVENOUS | Status: AC
  Administered 2017-09-17: 144 mg via INTRAVENOUS
  Filled 2017-09-17: qty 24

## 2017-09-17 MED ORDER — TRASTUZUMAB CHEMO 150 MG IV SOLR
150.0000 mg | Freq: Once | INTRAVENOUS | Status: AC
  Administered 2017-09-17: 150 mg via INTRAVENOUS
  Filled 2017-09-17: qty 7.14

## 2017-09-17 MED ORDER — PALONOSETRON HCL INJECTION 0.25 MG/5ML
INTRAVENOUS | Status: AC
  Filled 2017-09-17: qty 5

## 2017-09-17 MED ORDER — FAMOTIDINE IN NACL 20-0.9 MG/50ML-% IV SOLN
INTRAVENOUS | Status: AC
  Filled 2017-09-17: qty 50

## 2017-09-17 MED ORDER — SODIUM CHLORIDE 0.9% FLUSH
10.0000 mL | Freq: Once | INTRAVENOUS | Status: AC
  Administered 2017-09-17: 10 mL via INTRAVENOUS
  Filled 2017-09-17: qty 10

## 2017-09-17 MED ORDER — FAMOTIDINE IN NACL 20-0.9 MG/50ML-% IV SOLN
20.0000 mg | Freq: Once | INTRAVENOUS | Status: AC
  Administered 2017-09-17: 20 mg via INTRAVENOUS

## 2017-09-17 MED ORDER — ACETAMINOPHEN 325 MG PO TABS
650.0000 mg | ORAL_TABLET | Freq: Once | ORAL | Status: AC
  Administered 2017-09-17: 650 mg via ORAL

## 2017-09-17 MED ORDER — ACETAMINOPHEN 325 MG PO TABS
ORAL_TABLET | ORAL | Status: AC
  Filled 2017-09-17: qty 2

## 2017-09-17 MED ORDER — DEXAMETHASONE SODIUM PHOSPHATE 100 MG/10ML IJ SOLN
20.0000 mg | Freq: Once | INTRAMUSCULAR | Status: AC
  Administered 2017-09-17: 20 mg via INTRAVENOUS
  Filled 2017-09-17: qty 2

## 2017-09-17 MED ORDER — DIPHENHYDRAMINE HCL 50 MG/ML IJ SOLN
INTRAMUSCULAR | Status: AC
  Filled 2017-09-17: qty 1

## 2017-09-17 MED ORDER — SODIUM CHLORIDE 0.9 % IV SOLN
Freq: Once | INTRAVENOUS | Status: AC
  Administered 2017-09-17: 13:00:00 via INTRAVENOUS

## 2017-09-17 MED ORDER — HEPARIN SOD (PORK) LOCK FLUSH 100 UNIT/ML IV SOLN
500.0000 [IU] | Freq: Once | INTRAVENOUS | Status: AC | PRN
  Administered 2017-09-17: 500 [IU]
  Filled 2017-09-17: qty 5

## 2017-09-17 MED ORDER — SODIUM CHLORIDE 0.9% FLUSH
10.0000 mL | INTRAVENOUS | Status: DC | PRN
  Administered 2017-09-17: 10 mL
  Filled 2017-09-17: qty 10

## 2017-09-17 NOTE — Patient Instructions (Signed)
[facility] Discharge Instructions for Patients Receiving Chemotherapy  Today you received the following chemotherapy agents:  Hereceptin, Taxol  To help prevent nausea and vomiting after your treatment, we encourage you to take your nausea medication as prescribed.   If you develop nausea and vomiting that is not controlled by your nausea medication, call the clinic.   BELOW ARE SYMPTOMS THAT SHOULD BE REPORTED IMMEDIATELY:  *FEVER GREATER THAN 100.5 F  *CHILLS WITH OR WITHOUT FEVER  NAUSEA AND VOMITING THAT IS NOT CONTROLLED WITH YOUR NAUSEA MEDICATION  *UNUSUAL SHORTNESS OF BREATH  *UNUSUAL BRUISING OR BLEEDING  TENDERNESS IN MOUTH AND THROAT WITH OR WITHOUT PRESENCE OF ULCERS  *URINARY PROBLEMS  *BOWEL PROBLEMS  UNUSUAL RASH Items with * indicate a potential emergency and should be followed up as soon as possible.  Feel free to call the clinic should you have any questions or concerns. The clinic phone number is [phone].  Please show the [place] at check-in to the Emergency Department and triage nurse.

## 2017-09-17 NOTE — Patient Instructions (Signed)
Implanted Port Home Guide An implanted port is a type of central line that is placed under the skin. Central lines are used to provide IV access when treatment or nutrition needs to be given through a person's veins. Implanted ports are used for long-term IV access. An implanted port may be placed because:  You need IV medicine that would be irritating to the small veins in your hands or arms.  You need long-term IV medicines, such as antibiotics.  You need IV nutrition for a long period.  You need frequent blood draws for lab tests.  You need dialysis.  Implanted ports are usually placed in the chest area, but they can also be placed in the upper arm, the abdomen, or the leg. An implanted port has two main parts:  Reservoir. The reservoir is round and will appear as a small, raised area under your skin. The reservoir is the part where a needle is inserted to give medicines or draw blood.  Catheter. The catheter is a thin, flexible tube that extends from the reservoir. The catheter is placed into a large vein. Medicine that is inserted into the reservoir goes into the catheter and then into the vein.  How will I care for my incision site? Do not get the incision site wet. Bathe or shower as directed by your health care provider. How is my port accessed? Special steps must be taken to access the port:  Before the port is accessed, a numbing cream can be placed on the skin. This helps numb the skin over the port site.  Your health care provider uses a sterile technique to access the port. ? Your health care provider must put on a mask and sterile gloves. ? The skin over your port is cleaned carefully with an antiseptic and allowed to dry. ? The port is gently pinched between sterile gloves, and a needle is inserted into the port.  Only "non-coring" port needles should be used to access the port. Once the port is accessed, a blood return should be checked. This helps ensure that the port  is in the vein and is not clogged.  If your port needs to remain accessed for a constant infusion, a clear (transparent) bandage will be placed over the needle site. The bandage and needle will need to be changed every week, or as directed by your health care provider.  Keep the bandage covering the needle clean and dry. Do not get it wet. Follow your health care provider's instructions on how to take a shower or bath while the port is accessed.  If your port does not need to stay accessed, no bandage is needed over the port.  What is flushing? Flushing helps keep the port from getting clogged. Follow your health care provider's instructions on how and when to flush the port. Ports are usually flushed with saline solution or a medicine called heparin. The need for flushing will depend on how the port is used.  If the port is used for intermittent medicines or blood draws, the port will need to be flushed: ? After medicines have been given. ? After blood has been drawn. ? As part of routine maintenance.  If a constant infusion is running, the port may not need to be flushed.  How long will my port stay implanted? The port can stay in for as long as your health care provider thinks it is needed. When it is time for the port to come out, surgery will be   done to remove it. The procedure is similar to the one performed when the port was put in. When should I seek immediate medical care? When you have an implanted port, you should seek immediate medical care if:  You notice a bad smell coming from the incision site.  You have swelling, redness, or drainage at the incision site.  You have more swelling or pain at the port site or the surrounding area.  You have a fever that is not controlled with medicine.  This information is not intended to replace advice given to you by your health care provider. Make sure you discuss any questions you have with your health care provider. Document  Released: 09/11/2005 Document Revised: 02/17/2016 Document Reviewed: 05/19/2013 Elsevier Interactive Patient Education  2017 [employer]  

## 2017-09-21 ENCOUNTER — Emergency Department (HOSPITAL_COMMUNITY): Payer: Medicaid Other

## 2017-09-21 ENCOUNTER — Encounter (HOSPITAL_COMMUNITY): Payer: Self-pay | Admitting: Emergency Medicine

## 2017-09-21 ENCOUNTER — Emergency Department (HOSPITAL_COMMUNITY)
Admission: EM | Admit: 2017-09-21 | Discharge: 2017-09-21 | Disposition: A | Discharge status: Home / Self Care | Payer: Medicaid Other | Attending: Emergency Medicine | Admitting: Emergency Medicine

## 2017-09-21 DIAGNOSIS — R1032 Left lower quadrant pain: Secondary | ICD-10-CM

## 2017-09-21 DIAGNOSIS — J45909 Unspecified asthma, uncomplicated: Secondary | ICD-10-CM | POA: Diagnosis not present

## 2017-09-21 DIAGNOSIS — Z87891 Personal history of nicotine dependence: Secondary | ICD-10-CM | POA: Diagnosis not present

## 2017-09-21 DIAGNOSIS — R1030 Lower abdominal pain, unspecified: Secondary | ICD-10-CM | POA: Diagnosis present

## 2017-09-21 DIAGNOSIS — Z79899 Other long term (current) drug therapy: Secondary | ICD-10-CM | POA: Insufficient documentation

## 2017-09-21 DIAGNOSIS — C50411 Malignant neoplasm of upper-outer quadrant of right female breast: Secondary | ICD-10-CM | POA: Diagnosis not present

## 2017-09-21 DIAGNOSIS — R1031 Right lower quadrant pain: Secondary | ICD-10-CM

## 2017-09-21 LAB — COMPREHENSIVE METABOLIC PANEL
ALT: 21 U/L (ref 14–54)
ANION GAP: 7 (ref 5–15)
AST: 20 U/L (ref 15–41)
Albumin: 4 g/dL (ref 3.5–5.0)
Alkaline Phosphatase: 67 U/L (ref 38–126)
BILIRUBIN TOTAL: 0.6 mg/dL (ref 0.3–1.2)
BUN: 10 mg/dL (ref 6–20)
CHLORIDE: 103 mmol/L (ref 101–111)
CO2: 26 mmol/L (ref 22–32)
Calcium: 8.8 mg/dL — ABNORMAL LOW (ref 8.9–10.3)
Creatinine, Ser: 0.55 mg/dL (ref 0.44–1.00)
Glucose, Bld: 91 mg/dL (ref 65–99)
POTASSIUM: 3.8 mmol/L (ref 3.5–5.1)
Sodium: 136 mmol/L (ref 135–145)
TOTAL PROTEIN: 6.8 g/dL (ref 6.5–8.1)

## 2017-09-21 LAB — URINALYSIS, ROUTINE W REFLEX MICROSCOPIC
Bilirubin Urine: NEGATIVE
Glucose, UA: NEGATIVE mg/dL
Hgb urine dipstick: NEGATIVE
KETONES UR: NEGATIVE mg/dL
LEUKOCYTES UA: NEGATIVE
NITRITE: NEGATIVE
PH: 7 (ref 5.0–8.0)
Protein, ur: NEGATIVE mg/dL
SPECIFIC GRAVITY, URINE: 1.016 (ref 1.005–1.030)

## 2017-09-21 LAB — CBC
HEMATOCRIT: 34.1 % — AB (ref 36.0–46.0)
HEMOGLOBIN: 11 g/dL — AB (ref 12.0–15.0)
MCH: 26.4 pg (ref 26.0–34.0)
MCHC: 32.3 g/dL (ref 30.0–36.0)
MCV: 81.8 fL (ref 78.0–100.0)
Platelets: 217 10*3/uL (ref 150–400)
RBC: 4.17 MIL/uL (ref 3.87–5.11)
RDW: 14.6 % (ref 11.5–15.5)
WBC: 4.9 10*3/uL (ref 4.0–10.5)

## 2017-09-21 LAB — I-STAT BETA HCG BLOOD, ED (MC, WL, AP ONLY): I-stat hCG, quantitative: <5 m[IU]/mL (ref <5)

## 2017-09-21 LAB — LIPASE, BLOOD: LIPASE: 22 U/L (ref 11–51)

## 2017-09-21 MED ORDER — IBUPROFEN 200 MG PO TABS
400.0000 mg | ORAL_TABLET | Freq: Once | ORAL | Status: AC
  Administered 2017-09-21: 400 mg via ORAL
  Filled 2017-09-21: qty 2

## 2017-09-21 MED ORDER — ONDANSETRON HCL 4 MG/2ML IJ SOLN
4.0000 mg | Freq: Once | INTRAMUSCULAR | Status: AC
  Administered 2017-09-21: 4 mg via INTRAVENOUS
  Filled 2017-09-21: qty 2

## 2017-09-21 MED ORDER — SODIUM CHLORIDE 0.9 % IV BOLUS (SEPSIS)
1000.0000 mL | Freq: Once | INTRAVENOUS | Status: AC
  Administered 2017-09-21: 1000 mL via INTRAVENOUS

## 2017-09-21 MED ORDER — IOPAMIDOL (ISOVUE-300) INJECTION 61%
INTRAVENOUS | Status: AC
  Administered 2017-09-21: 100 mL via INTRAVENOUS
  Filled 2017-09-21: qty 100

## 2017-09-21 MED ORDER — HEPARIN SOD (PORK) LOCK FLUSH 100 UNIT/ML IV SOLN
500.0000 [IU] | Freq: Once | INTRAVENOUS | Status: AC
  Administered 2017-09-21: 500 [IU]
  Filled 2017-09-21: qty 5

## 2017-09-21 MED ORDER — IOPAMIDOL (ISOVUE-300) INJECTION 61%
100.0000 mL | Freq: Once | INTRAVENOUS | Status: AC | PRN
  Administered 2017-09-21: 100 mL via INTRAVENOUS

## 2017-09-21 MED ORDER — ACETAMINOPHEN 325 MG PO TABS
650.0000 mg | ORAL_TABLET | Freq: Once | ORAL | Status: AC
  Administered 2017-09-21: 650 mg via ORAL
  Filled 2017-09-21: qty 2

## 2017-09-21 NOTE — ED Notes (Signed)
Bed: WA07 Expected date:  Expected time:  Means of arrival:  Comments: 

## 2017-09-21 NOTE — ED Triage Notes (Signed)
Pt c/o L flank pain and sharp pains in lower abdomen. Pt states she is having pain with bowel movements, frequent bowel movements, c/o of urine feeling "hot", denies vomiting. Pt has not attempted any meds for pain.

## 2017-09-21 NOTE — Discharge Instructions (Signed)
It was our pleasure to provide your ER care today - we hope that you feel better.  Drink adequate fluids.  Take acetaminophen and/or ibuprofen as need for pain.  Follow up with primary care doctor in the coming week if symptoms fail to improve/resolve.  Return to ER if worse, new symptoms, fevers, persistent vomiting, other concern.

## 2017-09-21 NOTE — ED Provider Notes (Signed)
[facility] DEPT Provider Note   CSN: [id number] Arrival date & time: 09/21/17  6203     History   Chief Complaint Chief Complaint  Patient presents with  . Abdominal Pain  . Flank Pain    HPI Katherine [name] is a 46 y.o. female.  Patient c/o bilateral lower abd pain for past 1-2 days, worse on right. Pain dull, moderate, constant, non-radiating, without specific exacerbating or alleviating factors. No fever or chills. Decreased appetite, nausea. No vomiting. No abd distension. Prior abd surgery includes hysterectomy. No dysuria or hematuria. No vaginal discharge or bleeding.    The history is provided by the patient.  Abdominal Pain   Pertinent negatives include fever, diarrhea, vomiting and headaches.  Flank Pain  Associated symptoms include abdominal pain. Pertinent negatives include no chest pain, no headaches and no shortness of breath.    Past Medical History:  Diagnosis Date  . Anxiety   . Asthma   . Dysrhythmia    History of SVT-No medications  . Fibromyalgia   . GERD (gastroesophageal reflux disease)   . Hot flashes   . Malignant neoplasm of upper-outer quadrant of right female breast ([facility]) 06/2017   right breast  . Migraine   . Palpitations   . Pneumonia    with cavitation of left lower lobe  . Tuberculosis    exposure as a child, get checked frequently  . Uterine fibroid     Patient Active Problem List   Diagnosis Date Noted  . Malignant neoplasm of upper-outer quadrant of right breast in female, estrogen receptor negative ([facility]) 07/03/2017  . Pelvic pain in female 12/21/2016  . Fibroids, intramural 12/21/2016  . Post-operative state 12/19/2016  . Nocturia 08/09/2015  . Fibromyalgia 06/25/2015  . GERD (gastroesophageal reflux disease) 06/16/2013  . Migraine 06/16/2013  . Atrial tachycardia, paroxysmal ([facility]) 03/31/2013  . Headache 12/20/2012  . Syncope 06/20/2012  . Fibroids 03/26/2012  . Family history of breast  cancer 01/22/2012  . Seasonal and perennial allergic rhinitis 07/18/2010  . HAIR LOSS 07/04/2010  . ANXIETY DISORDER, SITUATIONAL, MILD 11/17/2009  . CHEST PAIN, LEFT 10/12/2009  . DYSURIA 08/26/2009  . MAMMOGRAM, ABNORMAL, RIGHT, HX OF 04/09/2009  . TOBACCO ABUSE 03/17/2009  . DECREASED HEARING, RIGHT EAR 10/27/2008  . BACK PAIN, LUMBAR 06/06/2007  . HOT FLASHES 07/18/2006    Past Surgical History:  Procedure Laterality Date  . ABDOMINAL HYSTERECTOMY    . BREAST LUMPECTOMY WITH RADIOACTIVE SEED AND SENTINEL LYMPH NODE BIOPSY Right 07/24/2017   Procedure: RIGHT BREAST LUMPECTOMY WITH RADIOACTIVE SEED AND SENTINEL LYMPH NODE BIOPSY;  Surgeon: Erroll Luna, MD;  Location: [facility];  Service: General;  Laterality: Right;  . PORTACATH PLACEMENT Right 07/24/2017   Procedure: INSERTION PORT-A-CATH;  Surgeon: Erroll Luna, MD;  Location: [facility];  Service: General;  Laterality: Right;  . TUBAL LIGATION  1993  . VAGINAL HYSTERECTOMY Bilateral 12/19/2016   Procedure: HYSTERECTOMY VAGINAL;  Surgeon: Lavonia Drafts, MD;  Location: [facility];  Service: Gynecology;  Laterality: Bilateral;  . WISDOM TOOTH EXTRACTION      OB History    Gravida Para Term Preterm AB Living   _0 SAB TAB Ectopic Multiple Live Births                   Home Medications    Prior to Admission medications   Medication Sig Start Date End Date Taking? Authorizing Provider  albuterol (  PROVENTIL HFA;VENTOLIN HFA) 108 (90 Base) MCG/ACT inhaler Inhale 2 puffs into the lungs every 6 (six) hours as needed for wheezing or shortness of breath.    [provider]  ibuprofen (ADVIL,MOTRIN) 800 MG tablet Take 1 tablet (800 mg total) by mouth every 8 (eight) hours as needed. 07/24/17   Cornett, Marcello Moores, MD  ipratropium (ATROVENT) 0.03 % nasal spray SPRAY TWO SQUIRTS INTO EACH NOSTRIL TWICE A DAY 07/09/17   [provider]  lidocaine-prilocaine (EMLA) cream  Apply to affected area once 07/31/17   Nicholas Lose, MD  LORazepam (ATIVAN) 0.5 MG tablet Take 1 tablet (0.5 mg total) at bedtime as needed by mouth for sleep. 07/31/17   Nicholas Lose, MD  ondansetron (ZOFRAN) 8 MG tablet Take 1 tablet (8 mg total) 2 (two) times daily as needed by mouth (Nausea or vomiting). 07/31/17   Nicholas Lose, MD  pantoprazole (PROTONIX) 40 MG tablet Take 40 mg by mouth daily.    [provider]  prochlorperazine (COMPAZINE) 10 MG tablet Take 1 tablet (10 mg total) every 6 (six) hours as needed by mouth (Nausea or vomiting). 07/31/17   Nicholas Lose, MD  SUMAtriptan (IMITREX) 50 MG tablet Take 50 mg by mouth daily as needed for migraine. 11/08/16   [provider]  tiZANidine (ZANAFLEX) 4 MG tablet Take 4 mg by mouth every 6 (six) hours as needed for muscle spasms.    [provider]    Family History Family History  Problem Relation Age of Onset  . Migraines Daughter   . Hypertension Mother   . Heart disease Mother   . Hypertension Father   . Heart disease Father   . Breast cancer Sister 39       deceased at age 19  . Migraines Son   . Hypertension Unknown   . Colon cancer Neg Hx   . Colon polyps Neg Hx   . Diabetes Neg Hx   . Kidney disease Neg Hx   . Liver disease Neg Hx   . Heart attack Neg Hx   . Stroke Neg Hx     Social History Social History   Tobacco Use  . Smoking status: Former Smoker    Packs/day: 0.50    Types: Cigarettes    Last attempt to quit: 07/16/2017    Years since quitting: 0.1  . Smokeless tobacco: Never Used  . Tobacco comment: smokes 1cigarette per day and more if she is stressed  Substance Use Topics  . Alcohol use: No    Alcohol/week: 0.0 oz  . Drug use: Yes    Types: Marijuana    Comment: occasional, for pain. states she hasnt used for a mont; 08/02/17 sometimes     Allergies   Morphine; Shellfish allergy; Penicillins; Codeine; Hydrocodone; and Tomato   Review of Systems Review of Systems    Constitutional: Negative for chills and fever.  HENT: Negative for sore throat.   Eyes: Negative for redness.  Respiratory: Negative for shortness of breath.   Cardiovascular: Negative for chest pain.  Gastrointestinal: Positive for abdominal pain. Negative for diarrhea and vomiting.  Endocrine: Negative for polyuria.  Genitourinary: Positive for flank pain.  Musculoskeletal: Negative for back pain and neck pain.  Skin: Negative for rash.  Neurological: Negative for headaches.  Hematological: Does not bruise/bleed easily.  Psychiatric/Behavioral: Negative for confusion.     Physical Exam Updated Vital Signs BP (!) 125/91 (BP Location: Right Arm)   Pulse 63   Temp 98.2 F (36.8 C) (Oral)  Resp 16   LMP 11/13/2016   SpO2 100%   Physical Exam  Constitutional: She appears well-developed and well-nourished. No distress.  HENT:  Mouth/Throat: Oropharynx is clear and moist.  Eyes: Conjunctivae are normal. No scleral icterus.  Neck: Neck supple. No tracheal deviation present.  Cardiovascular: Normal rate, regular rhythm, normal heart sounds and intact distal pulses. Exam reveals no gallop and no friction rub.  No murmur heard. Pulmonary/Chest: Effort normal and breath sounds normal. No respiratory distress.  Abdominal: Soft. Normal appearance and bowel sounds are normal. She exhibits no distension and no mass. There is tenderness. There is no rebound and no guarding. No hernia.  Tenderness mid to lower abd.   Genitourinary:  Genitourinary Comments: No cva tenderness  Musculoskeletal: She exhibits no edema.  Neurological: She is alert.  Skin: Skin is warm and dry. No rash noted. She is not diaphoretic.  Psychiatric: She has a normal mood and affect.  Nursing note and vitals reviewed.    ED Treatments / Results  Labs (all labs ordered are listed, but only abnormal results are displayed) Results for orders placed or performed in visit on 09/17/17  Comprehensive metabolic  panel  Result Value Ref Range   Sodium 139 136 - 145 mEq/L   Potassium 4.2 3.5 - 5.1 mEq/L   Chloride 107 98 - 109 mEq/L   CO2 25 22 - 29 mEq/L   Glucose 102 70 - 140 mg/dl   BUN 7.6 7.0 - 26.0 mg/dL   Creatinine 0.7 0.6 - 1.1 mg/dL   Total Bilirubin <0.22 0.20 - 1.20 mg/dL   Alkaline Phosphatase 80 40 - 150 U/L   AST 12 5 - 34 U/L   ALT 13 0 - 55 U/L   Total Protein 6.8 6.4 - 8.3 g/dL   Albumin 4.0 3.5 - 5.0 g/dL   Calcium 9.0 8.4 - 10.4 mg/dL   Anion Gap 8 3 - 11 mEq/L   EGFR >60 >60 ml/min/1.73 m2  CBC with Differential  Result Value Ref Range   WBC 6.5 3.9 - 10.3 10e3/uL   NEUT# 2.7 1.5 - 6.5 10e3/uL   HGB 11.0 (L) 11.6 - 15.9 g/dL   HCT 34.2 (L) 34.8 - 46.6 %   Platelets 205 145 - 400 10e3/uL   MCV 82.4 79.5 - 101.0 fL   MCH 26.5 25.1 - 34.0 pg   MCHC 32.1 31.5 - 36.0 g/dL   RBC 4.15 3.70 - 5.45 10e6/uL   RDW 14.3 11.2 - 14.5 %   lymph# 3.4 (H) 0.9 - 3.3 10e3/uL   MONO# 0.3 0.1 - 0.9 10e3/uL   Eosinophils Absolute 0.1 0.0 - 0.5 10e3/uL   Basophils Absolute 0.0 0.0 - 0.1 10e3/uL   NEUT% 41.1 38.4 - 76.8 %   LYMPH% 52.0 (H) 14.0 - 49.7 %   MONO% 4.5 0.0 - 14.0 %   EOS% 2.0 0.0 - 7.0 %   BASO% 0.4 0.0 - 2.0 %    EKG  EKG Interpretation None       Radiology No results found.  Procedures Procedures (including critical care time)  Medications Ordered in ED Medications  sodium chloride 0.9 % bolus 1,000 mL (not administered)  ondansetron (ZOFRAN) injection 4 mg (not administered)     Initial Impression / Assessment and Plan / ED Course  I have reviewed the triage vital signs and the nursing notes.  Pertinent labs & imaging results that were available during my care of the patient were reviewed by me and considered  in my medical decision making (see chart for details).  Iv ns bolus. zofran iv.  Labs sent.  Reviewed nursing notes and prior charts for additional history.   Ct neg acute.  Ibuprofen po.   Acetaminophen po.   No vomiting. Afeb. Ct  neg acute.  Pt appears stable for d/c.     Final Clinical Impressions(s) / ED Diagnoses   Final diagnoses:  None    ED Discharge Orders    None       Lajean Saver, MD 09/21/17 1112

## 2017-09-24 ENCOUNTER — Ambulatory Visit (HOSPITAL_BASED_OUTPATIENT_CLINIC_OR_DEPARTMENT_OTHER): Payer: Medicaid Other | Admitting: Hematology and Oncology

## 2017-09-24 ENCOUNTER — Ambulatory Visit (HOSPITAL_BASED_OUTPATIENT_CLINIC_OR_DEPARTMENT_OTHER): Payer: Medicaid Other

## 2017-09-24 ENCOUNTER — Ambulatory Visit: Payer: Medicaid Other

## 2017-09-24 ENCOUNTER — Other Ambulatory Visit: Payer: Self-pay | Admitting: *Deleted

## 2017-09-24 DIAGNOSIS — Z5112 Encounter for antineoplastic immunotherapy: Secondary | ICD-10-CM

## 2017-09-24 DIAGNOSIS — Z95828 Presence of other vascular implants and grafts: Secondary | ICD-10-CM | POA: Insufficient documentation

## 2017-09-24 DIAGNOSIS — Z171 Estrogen receptor negative status [ER-]: Secondary | ICD-10-CM | POA: Diagnosis not present

## 2017-09-24 DIAGNOSIS — C50411 Malignant neoplasm of upper-outer quadrant of right female breast: Secondary | ICD-10-CM

## 2017-09-24 DIAGNOSIS — Z5111 Encounter for antineoplastic chemotherapy: Secondary | ICD-10-CM

## 2017-09-24 DIAGNOSIS — R197 Diarrhea, unspecified: Secondary | ICD-10-CM

## 2017-09-24 LAB — COMPREHENSIVE METABOLIC PANEL
ALT: 39 U/L (ref 0–55)
AST: 25 U/L (ref 5–34)
Albumin: 4.2 g/dL (ref 3.5–5.0)
Alkaline Phosphatase: 81 U/L (ref 40–150)
Anion Gap: 8 mEq/L (ref 3–11)
BUN: 11.7 mg/dL (ref 7.0–26.0)
CHLORIDE: 108 meq/L (ref 98–109)
CO2: 24 meq/L (ref 22–29)
Calcium: 9 mg/dL (ref 8.4–10.4)
Creatinine: 0.7 mg/dL (ref 0.6–1.1)
EGFR: >60 mL/min/{1.73_m2} (ref >60)
GLUCOSE: 104 mg/dL (ref 70–140)
POTASSIUM: 3.8 meq/L (ref 3.5–5.1)
SODIUM: 140 meq/L (ref 136–145)
Total Bilirubin: 0.41 mg/dL (ref 0.20–1.20)
Total Protein: 7.2 g/dL (ref 6.4–8.3)

## 2017-09-24 LAB — CBC WITH DIFFERENTIAL/PLATELET
BASO%: 0.6 % (ref 0.0–2.0)
Basophils Absolute: 0 10*3/uL (ref 0.0–0.1)
EOS ABS: 0.1 10*3/uL (ref 0.0–0.5)
EOS%: 0.9 % (ref 0.0–7.0)
HEMATOCRIT: 34.5 % — AB (ref 34.8–46.6)
HGB: 11.5 g/dL — ABNORMAL LOW (ref 11.6–15.9)
LYMPH%: 44.4 % (ref 14.0–49.7)
MCH: 27.2 pg (ref 25.1–34.0)
MCHC: 33.3 g/dL (ref 31.5–36.0)
MCV: 81.6 fL (ref 79.5–101.0)
MONO#: 0.3 10*3/uL (ref 0.1–0.9)
MONO%: 5.2 % (ref 0.0–14.0)
NEUT#: 2.6 10*3/uL (ref 1.5–6.5)
NEUT%: 48.9 % (ref 38.4–76.8)
PLATELETS: 202 10*3/uL (ref 145–400)
RBC: 4.23 10*6/uL (ref 3.70–5.45)
RDW: 14.6 % — ABNORMAL HIGH (ref 11.2–14.5)
WBC: 5.3 10*3/uL (ref 3.9–10.3)
lymph#: 2.4 10*3/uL (ref 0.9–3.3)
nRBC: 0 % (ref 0–0)

## 2017-09-24 MED ORDER — HEPARIN SOD (PORK) LOCK FLUSH 100 UNIT/ML IV SOLN
500.0000 [IU] | Freq: Once | INTRAVENOUS | Status: AC | PRN
Start: 2017-09-24 — End: 2017-09-24
  Administered 2017-09-24: 500 [IU]
  Filled 2017-09-24: qty 5

## 2017-09-24 MED ORDER — ACETAMINOPHEN 325 MG PO TABS
ORAL_TABLET | ORAL | Status: AC
  Filled 2017-09-24: qty 2

## 2017-09-24 MED ORDER — FAMOTIDINE IN NACL 20-0.9 MG/50ML-% IV SOLN
20.0000 mg | Freq: Once | INTRAVENOUS | Status: AC
  Administered 2017-09-24: 20 mg via INTRAVENOUS

## 2017-09-24 MED ORDER — SODIUM CHLORIDE 0.9 % IV SOLN
80.0000 mg/m2 | Freq: Once | INTRAVENOUS | Status: AC
  Administered 2017-09-24: 144 mg via INTRAVENOUS
  Filled 2017-09-24: qty 24

## 2017-09-24 MED ORDER — FAMOTIDINE IN NACL 20-0.9 MG/50ML-% IV SOLN
INTRAVENOUS | Status: AC
  Filled 2017-09-24: qty 50

## 2017-09-24 MED ORDER — DIPHENHYDRAMINE HCL 50 MG/ML IJ SOLN
INTRAMUSCULAR | Status: AC
  Filled 2017-09-24: qty 1

## 2017-09-24 MED ORDER — LOPERAMIDE HCL 2 MG PO CAPS
ORAL_CAPSULE | ORAL | Status: AC
  Filled 2017-09-24: qty 1

## 2017-09-24 MED ORDER — LOPERAMIDE HCL 2 MG PO CAPS: 2.0000 mg | ORAL_CAPSULE | ORAL | Status: DC | PRN

## 2017-09-24 MED ORDER — SODIUM CHLORIDE 0.9 % IV SOLN
20.0000 mg | Freq: Once | INTRAVENOUS | Status: AC
  Administered 2017-09-24: 20 mg via INTRAVENOUS
  Filled 2017-09-24: qty 2

## 2017-09-24 MED ORDER — TRASTUZUMAB CHEMO 150 MG IV SOLR
150.0000 mg | Freq: Once | INTRAVENOUS | Status: AC
  Administered 2017-09-24: 150 mg via INTRAVENOUS
  Filled 2017-09-24: qty 7.14

## 2017-09-24 MED ORDER — SODIUM CHLORIDE 0.9% FLUSH
10.0000 mL | INTRAVENOUS | Status: DC | PRN
  Administered 2017-09-24: 10 mL
  Filled 2017-09-24: qty 10

## 2017-09-24 MED ORDER — DIPHENHYDRAMINE HCL 50 MG/ML IJ SOLN
50.0000 mg | Freq: Once | INTRAMUSCULAR | Status: AC
  Administered 2017-09-24: 50 mg via INTRAVENOUS

## 2017-09-24 MED ORDER — SODIUM CHLORIDE 0.9 % IV SOLN
Freq: Once | INTRAVENOUS | Status: AC
  Administered 2017-09-24: 12:00:00 via INTRAVENOUS

## 2017-09-24 MED ORDER — DIPHENOXYLATE-ATROPINE 2.5-0.025 MG PO TABS: 1.0000 | ORAL_TABLET | Freq: Four times a day (QID) | ORAL | 0 refills | Status: DC | PRN

## 2017-09-24 MED ORDER — SODIUM CHLORIDE 0.9% FLUSH
10.0000 mL | INTRAVENOUS | Status: DC | PRN
  Administered 2017-09-24: 10 mL via INTRAVENOUS
  Filled 2017-09-24: qty 10

## 2017-09-24 MED ORDER — ACETAMINOPHEN 325 MG PO TABS
650.0000 mg | ORAL_TABLET | Freq: Once | ORAL | Status: AC
  Administered 2017-09-24: 650 mg via ORAL

## 2017-09-24 MED ORDER — LOPERAMIDE HCL 2 MG PO CAPS
2.0000 mg | ORAL_CAPSULE | ORAL | Status: DC | PRN
  Administered 2017-09-24: 2 mg via ORAL

## 2017-09-24 MED FILL — DIPHENOXYLATE/ATROPINE TAB: 2.5-0.025 | 7 days supply | Qty: 30 | Fill #0

## 2017-09-24 NOTE — Progress Notes (Signed)
Patient Care Team: [name], MD as PCP - General (Family Medicine) Erroll Luna, MD as Consulting Physician (General Surgery)  DIAGNOSIS:  Encounter Diagnosis  Name Primary?  . Malignant neoplasm of upper-outer quadrant of right breast in female, estrogen receptor negative ([facility])     SUMMARY OF ONCOLOGIC HISTORY:   Malignant neoplasm of upper-outer quadrant of right breast in female, estrogen receptor negative ([place])   06/26/2017 Initial Diagnosis    Right breast biopsy 8:30 position 8 cm from nipple: IDC with DCIS, second biopsy 6 cm from nipple fibrocystic changes: Grade 3, ER 0%, PR 0%, Ki-67 40%, HER-2 positive ratio 3.59; mammogram and ultrasound revealed 1.9 cm mass at 8:30 position, adjacent 0.7 cm cystic mass, T1c N0 stage IA AJCC 8       07/24/2017 Surgery    Right lumpectomy: IDC grade 3, 1.8 cm, 0/2 lymph nodes negative,ER 0%, PR 0%, Ki-67 40%, HER-2 positive ratio 3.59, T1CN0 stage I a       09/10/2017 -  Chemotherapy    Taxol Herceptin weekly x12 followed by Herceptin maintenance for 1 year       CHIEF COMPLIANT: Cycle 3 Taxol Herceptin, complaining of diarrhea  INTERVAL HISTORY: Katherine [name] is a 46 year old with above-mentioned history of right breast cancer treated with lumpectomy and is currently on chemotherapy.  Today is cycle 4 of Taxol and Herceptin.  She has diarrhea for the last week.  She went to the emergency room and they gave her IV fluids.  However it appears that she did not tell them about her diarrhea.  She has been in the bathroom 10-20 times per day.  She has not taken any Imodium or any other antidiarrheal medication.  She has not had any fevers.  REVIEW OF SYSTEMS:   Constitutional: Denies fevers, chills or abnormal weight loss Eyes: Denies blurriness of vision Ears, nose, mouth, throat, and face: Denies mucositis or sore throat Respiratory: Denies cough, dyspnea or wheezes Cardiovascular: Denies palpitation, chest  discomfort Gastrointestinal: Diarrhea and abdominal pain Skin: Denies abnormal skin rashes Lymphatics: Denies new lymphadenopathy or easy bruising Neurological:Denies numbness, tingling or new weaknesses Behavioral/Psych: Mood is stable, no new changes  Extremities: No lower extremity edema  All other systems were reviewed with the patient and are negative.  I have reviewed the past medical history, past surgical history, social history and family history with the patient and they are unchanged from previous note.  ALLERGIES:  is allergic to morphine; shellfish allergy; penicillins; codeine; hydrocodone; and tomato.  MEDICATIONS:  Current Outpatient Medications  Medication Sig Dispense Refill  . albuterol (PROVENTIL HFA;VENTOLIN HFA) 108 (90 Base) MCG/ACT inhaler Inhale 2 puffs into the lungs every 6 (six) hours as needed for wheezing or shortness of breath.    Marland Kitchen ibuprofen (ADVIL,MOTRIN) 800 MG tablet Take 1 tablet (800 mg total) by mouth every 8 (eight) hours as needed. (Patient taking differently: Take 800 mg by mouth every 8 (eight) hours as needed for moderate pain. ) 30 tablet 0  . ipratropium (ATROVENT) 0.03 % nasal spray SPRAY TWO SQUIRTS INTO EACH NOSTRIL TWICE A DAY  1  . lidocaine-prilocaine (EMLA) cream Apply to affected area once 30 g 3  . LORazepam (ATIVAN) 0.5 MG tablet Take 1 tablet (0.5 mg total) at bedtime as needed by mouth for sleep. (Patient not taking: Reported on 09/21/2017) 30 tablet 0  . ondansetron (ZOFRAN) 8 MG tablet Take 1 tablet (8 mg total) 2 (two) times daily as needed by mouth (Nausea or  vomiting). 30 tablet 1  . pantoprazole (PROTONIX) 40 MG tablet Take 40 mg by mouth daily.    . prochlorperazine (COMPAZINE) 10 MG tablet Take 1 tablet (10 mg total) every 6 (six) hours as needed by mouth (Nausea or vomiting). 30 tablet 1   No current facility-administered medications for this visit.     PHYSICAL EXAMINATION: ECOG PERFORMANCE STATUS: 1 - Symptomatic but  completely ambulatory  Vitals:   09/24/17 1105  BP: 130/84  Pulse: 68  Resp: 20  Temp: 98.5 F (36.9 C)  SpO2: 100%   Filed Weights   09/24/17 1105  Weight: 151 lb 12.8 oz (68.9 kg)    GENERAL:alert, no distress and comfortable SKIN: skin color, texture, turgor are normal, no rashes or significant lesions EYES: normal, Conjunctiva are pink and non-injected, sclera clear OROPHARYNX:no exudate, no erythema and lips, buccal mucosa, and tongue normal  NECK: supple, thyroid normal size, non-tender, without nodularity LYMPH:  no palpable lymphadenopathy in the cervical, axillary or inguinal LUNGS: clear to auscultation and percussion with normal breathing effort HEART: regular rate & rhythm and no murmurs and no lower extremity edema ABDOMEN:abdomen soft, non-tender and normal bowel sounds MUSCULOSKELETAL:no cyanosis of digits and no clubbing  NEURO: alert & oriented x 3 with fluent speech, no focal motor/sensory deficits EXTREMITIES: No lower extremity edema   LABORATORY DATA:  I have reviewed the data as listed   Chemistry      Component Value Date/Time   NA 136 09/21/2017 0847   NA 139 09/17/2017 1054   K 3.8 09/21/2017 0847   K 4.2 09/17/2017 1054   CL 103 09/21/2017 0847   CO2 26 09/21/2017 0847   CO2 25 09/17/2017 1054   BUN 10 09/21/2017 0847   BUN 7.6 09/17/2017 1054   CREATININE 0.55 09/21/2017 0847   CREATININE 0.7 09/17/2017 1054      Component Value Date/Time   CALCIUM 8.8 (L) 09/21/2017 0847   CALCIUM 9.0 09/17/2017 1054   ALKPHOS 67 09/21/2017 0847   ALKPHOS 80 09/17/2017 1054   AST 20 09/21/2017 0847   AST 12 09/17/2017 1054   ALT 21 09/21/2017 0847   ALT 13 09/17/2017 1054   BILITOT 0.6 09/21/2017 0847   BILITOT <0.22 09/17/2017 1054       Lab Results  Component Value Date   WBC 5.3 09/24/2017   HGB 11.5 (L) 09/24/2017   HCT 34.5 (L) 09/24/2017   MCV 81.6 09/24/2017   PLT 202 09/24/2017   NEUTROABS 2.6 09/24/2017    ASSESSMENT & PLAN:   Malignant neoplasm of upper-outer quadrant of right breast in female, estrogen receptor negative ([facility]) 07/24/2017:Right lumpectomy: IDC grade 3, 1.8 cm, 0/2 lymph nodes negative,ER 0%, PR 0%, Ki-67 40%, HER-2 positive ratio 3.59, T1CN0 stage I a  Pathology counseling: I discussed the final pathology report of the patient provided  a copy of this report. I discussed the margins as well as lymph node surgeries. We also discussed the final staging along with previously performed ER/PR and HER-2/neu testing.  Recommendation: 1.  Adjuvant chemotherapy with Taxol Herceptin followed by Herceptin maintenance for 1 year 2. followed by adjuvant radiation therapy ------------------------------------------------------------------------------------------------------------------------------------ Current treatment: Cycle 3 Taxol Herceptin Labs reviewed Echocardiogram 08/03/2017: EF 60-65%  Chemo toxicities: 1.Severe diarrhea:  I instructed her to start taking Lomotil and Imodium.  I gave her instructions on how to take them. 2. fatigue related to chemotherapy   Return to clinic weekly for chemo and every 2 weeks for follow-up with me.  I spent 25 minutes talking to the patient of which more than half was spent in counseling and coordination of care.  No orders of the defined types were placed in this encounter.  The patient has a good understanding of the overall plan. she agrees with it. she will call with any problems that may develop before the next visit here.   Harriette Ohara, MD 09/24/17

## 2017-09-24 NOTE — Patient Instructions (Signed)
[facility] Discharge Instructions for Patients Receiving Chemotherapy  Today you received the following chemotherapy agents:  Herceptin and Taxol.  To help prevent nausea and vomiting after your treatment, we encourage you to take your nausea medication as directed.   If you develop nausea and vomiting that is not controlled by your nausea medication, call the clinic.   BELOW ARE SYMPTOMS THAT SHOULD BE REPORTED IMMEDIATELY:  *FEVER GREATER THAN 100.5 F  *CHILLS WITH OR WITHOUT FEVER  NAUSEA AND VOMITING THAT IS NOT CONTROLLED WITH YOUR NAUSEA MEDICATION  *UNUSUAL SHORTNESS OF BREATH  *UNUSUAL BRUISING OR BLEEDING  TENDERNESS IN MOUTH AND THROAT WITH OR WITHOUT PRESENCE OF ULCERS  *URINARY PROBLEMS  *BOWEL PROBLEMS  UNUSUAL RASH Items with * indicate a potential emergency and should be followed up as soon as possible.  Feel free to call the clinic should you have any questions or concerns. The clinic phone number is [phone].  Please show the CHEMO ALERT CARD at check-in to the Emergency Department and triage nurse.   

## 2017-09-24 NOTE — Assessment & Plan Note (Signed)
07/24/2017:Right lumpectomy: IDC grade 3, 1.8 cm, 0/2 lymph nodes negative,ER 0%, PR 0%, Ki-67 40%, HER-2 positive ratio 3.59, T1CN0 stage I a  Pathology counseling: I discussed the final pathology report of the patient provided  a copy of this report. I discussed the margins as well as lymph node surgeries. We also discussed the final staging along with previously performed ER/PR and HER-2/neu testing.  Recommendation: 1.  Adjuvant chemotherapy with Taxol Herceptin followed by Herceptin maintenance for 1 year 2. followed by adjuvant radiation therapy ------------------------------------------------------------------------------------------------------------------------------------ Current treatment: Cycle 3 Taxol Herceptin Labs reviewed Echocardiogram 08/03/2017: EF 60-65%  Chemo toxicities:  Return to clinic weekly for chemo and every 2 weeks for follow-up with me.

## 2017-09-28 ENCOUNTER — Telehealth: Payer: Self-pay | Admitting: Hematology and Oncology

## 2017-09-28 NOTE — Telephone Encounter (Signed)
Left message for patient regarding upcoming January through March appointments.

## 2017-10-01 ENCOUNTER — Inpatient Hospital Stay: Payer: Medicaid Other

## 2017-10-01 ENCOUNTER — Inpatient Hospital Stay: Payer: Medicaid Other | Attending: Hematology and Oncology

## 2017-10-01 VITALS — BP 115/74 | HR 66 | Temp 98.5°F | Resp 16

## 2017-10-01 DIAGNOSIS — R197 Diarrhea, unspecified: Secondary | ICD-10-CM | POA: Diagnosis not present

## 2017-10-01 DIAGNOSIS — T451X5S Adverse effect of antineoplastic and immunosuppressive drugs, sequela: Secondary | ICD-10-CM | POA: Diagnosis not present

## 2017-10-01 DIAGNOSIS — R5383 Other fatigue: Secondary | ICD-10-CM | POA: Insufficient documentation

## 2017-10-01 DIAGNOSIS — D6481 Anemia due to antineoplastic chemotherapy: Secondary | ICD-10-CM | POA: Diagnosis not present

## 2017-10-01 DIAGNOSIS — Z5111 Encounter for antineoplastic chemotherapy: Secondary | ICD-10-CM | POA: Insufficient documentation

## 2017-10-01 DIAGNOSIS — Z5112 Encounter for antineoplastic immunotherapy: Secondary | ICD-10-CM | POA: Insufficient documentation

## 2017-10-01 DIAGNOSIS — Z885 Allergy status to narcotic agent status: Secondary | ICD-10-CM | POA: Insufficient documentation

## 2017-10-01 DIAGNOSIS — Z171 Estrogen receptor negative status [ER-]: Secondary | ICD-10-CM | POA: Insufficient documentation

## 2017-10-01 DIAGNOSIS — R42 Dizziness and giddiness: Secondary | ICD-10-CM | POA: Insufficient documentation

## 2017-10-01 DIAGNOSIS — L658 Other specified nonscarring hair loss: Secondary | ICD-10-CM | POA: Insufficient documentation

## 2017-10-01 DIAGNOSIS — Z79899 Other long term (current) drug therapy: Secondary | ICD-10-CM | POA: Diagnosis not present

## 2017-10-01 DIAGNOSIS — G629 Polyneuropathy, unspecified: Secondary | ICD-10-CM | POA: Insufficient documentation

## 2017-10-01 DIAGNOSIS — Z880 Allergy status to penicillin: Secondary | ICD-10-CM | POA: Diagnosis not present

## 2017-10-01 DIAGNOSIS — C50411 Malignant neoplasm of upper-outer quadrant of right female breast: Secondary | ICD-10-CM | POA: Diagnosis not present

## 2017-10-01 DIAGNOSIS — Z95828 Presence of other vascular implants and grafts: Secondary | ICD-10-CM

## 2017-10-01 LAB — CBC WITH DIFFERENTIAL/PLATELET
Abs Granulocyte: 2.4 10*3/uL (ref 1.5–6.5)
Basophils Absolute: 0 10*3/uL (ref 0.0–0.1)
Basophils Relative: 1 %
EOS PCT: 2 %
Eosinophils Absolute: 0.1 10*3/uL (ref 0.0–0.5)
HCT: 31 % — ABNORMAL LOW (ref 34.8–46.6)
Hemoglobin: 10.3 g/dL — ABNORMAL LOW (ref 11.6–15.9)
LYMPHS ABS: 2.6 10*3/uL (ref 0.9–3.3)
LYMPHS PCT: 47 %
MCH: 27.6 pg (ref 25.1–34.0)
MCHC: 33.3 g/dL (ref 31.5–36.0)
MCV: 82.8 fL (ref 79.5–101.0)
MONO ABS: 0.3 10*3/uL (ref 0.1–0.9)
MONOS PCT: 6 %
Neutro Abs: 2.4 10*3/uL (ref 1.5–6.5)
Neutrophils Relative %: 44 %
PLATELETS: 229 10*3/uL (ref 145–400)
RBC: 3.74 MIL/uL (ref 3.70–5.45)
RDW: 14.9 % (ref 11.2–16.1)
WBC: 5.3 10*3/uL (ref 3.9–10.3)

## 2017-10-01 LAB — COMPREHENSIVE METABOLIC PANEL
ALT: 36 U/L (ref 0–55)
AST: 27 U/L (ref 5–34)
Albumin: 3.8 g/dL (ref 3.5–5.0)
Alkaline Phosphatase: 87 U/L (ref 40–150)
Anion gap: 5 (ref 3–11)
BUN: 9 mg/dL (ref 7–26)
CHLORIDE: 108 mmol/L (ref 98–109)
CO2: 25 mmol/L (ref 22–29)
CREATININE: 0.68 mg/dL (ref 0.60–1.10)
Calcium: 8.8 mg/dL (ref 8.4–10.4)
GFR calc Af Amer: >60 mL/min (ref >60)
Glucose, Bld: 98 mg/dL (ref 70–140)
Potassium: 4 mmol/L (ref 3.3–4.7)
Sodium: 138 mmol/L (ref 136–145)
Total Bilirubin: <0.2 mg/dL — ABNORMAL LOW (ref 0.2–1.2)
Total Protein: 6.6 g/dL (ref 6.4–8.3)

## 2017-10-01 MED ORDER — SODIUM CHLORIDE 0.9 % IV SOLN
150.0000 mg | Freq: Once | INTRAVENOUS | Status: AC
  Administered 2017-10-01: 150 mg via INTRAVENOUS
  Filled 2017-10-01: qty 7.14

## 2017-10-01 MED ORDER — SODIUM CHLORIDE 0.9 % IV SOLN
Freq: Once | INTRAVENOUS | Status: AC
  Administered 2017-10-01: 13:00:00 via INTRAVENOUS

## 2017-10-01 MED ORDER — FAMOTIDINE IN NACL 20-0.9 MG/50ML-% IV SOLN
20.0000 mg | Freq: Once | INTRAVENOUS | Status: AC
  Administered 2017-10-01: 20 mg via INTRAVENOUS

## 2017-10-01 MED ORDER — PACLITAXEL CHEMO INJECTION 300 MG/50ML
80.0000 mg/m2 | Freq: Once | INTRAVENOUS | Status: AC
  Administered 2017-10-01: 144 mg via INTRAVENOUS
  Filled 2017-10-01: qty 24

## 2017-10-01 MED ORDER — SODIUM CHLORIDE 0.9% FLUSH
10.0000 mL | INTRAVENOUS | Status: DC | PRN
  Administered 2017-10-01: 10 mL
  Filled 2017-10-01: qty 10

## 2017-10-01 MED ORDER — DEXAMETHASONE SODIUM PHOSPHATE 100 MG/10ML IJ SOLN
20.0000 mg | Freq: Once | INTRAMUSCULAR | Status: AC
  Administered 2017-10-01: 20 mg via INTRAVENOUS
  Filled 2017-10-01: qty 2

## 2017-10-01 MED ORDER — HEPARIN SOD (PORK) LOCK FLUSH 100 UNIT/ML IV SOLN
500.0000 [IU] | Freq: Once | INTRAVENOUS | Status: AC | PRN
  Administered 2017-10-01: 500 [IU]
  Filled 2017-10-01: qty 5

## 2017-10-01 MED ORDER — ACETAMINOPHEN 325 MG PO TABS
650.0000 mg | ORAL_TABLET | Freq: Once | ORAL | Status: AC
  Administered 2017-10-01: 650 mg via ORAL

## 2017-10-01 MED ORDER — ACETAMINOPHEN 325 MG PO TABS
ORAL_TABLET | ORAL | Status: AC
  Filled 2017-10-01: qty 2

## 2017-10-01 MED ORDER — FAMOTIDINE IN NACL 20-0.9 MG/50ML-% IV SOLN
INTRAVENOUS | Status: AC
  Filled 2017-10-01: qty 50

## 2017-10-01 MED ORDER — SODIUM CHLORIDE 0.9% FLUSH
10.0000 mL | INTRAVENOUS | Status: DC | PRN
  Administered 2017-10-01: 10 mL via INTRAVENOUS
  Filled 2017-10-01: qty 10

## 2017-10-01 MED ORDER — DIPHENHYDRAMINE HCL 50 MG/ML IJ SOLN
INTRAMUSCULAR | Status: AC
  Filled 2017-10-01: qty 1

## 2017-10-01 MED ORDER — DIPHENHYDRAMINE HCL 50 MG/ML IJ SOLN
50.0000 mg | Freq: Once | INTRAMUSCULAR | Status: AC
  Administered 2017-10-01: 50 mg via INTRAVENOUS

## 2017-10-01 NOTE — Patient Instructions (Signed)
[facility] Discharge Instructions for Patients Receiving Chemotherapy  Today you received the following chemotherapy agents: Trastuzumab (Herceptin) and Paclitaxel (Taxol)  To help prevent nausea and vomiting after your treatment, we encourage you to take your nausea medication  as prescribed.    If you develop nausea and vomiting that is not controlled by your nausea medication, call the clinic.   BELOW ARE SYMPTOMS THAT SHOULD BE REPORTED IMMEDIATELY:  *FEVER GREATER THAN 100.5 F  *CHILLS WITH OR WITHOUT FEVER  NAUSEA AND VOMITING THAT IS NOT CONTROLLED WITH YOUR NAUSEA MEDICATION  *UNUSUAL SHORTNESS OF BREATH  *UNUSUAL BRUISING OR BLEEDING  TENDERNESS IN MOUTH AND THROAT WITH OR WITHOUT PRESENCE OF ULCERS  *URINARY PROBLEMS  *BOWEL PROBLEMS  UNUSUAL RASH Items with * indicate a potential emergency and should be followed up as soon as possible.  Feel free to call the clinic should you have any questions or concerns. The clinic phone number is [phone].  Please show the CHEMO ALERT CARD at check-in to the Emergency Department and triage nurse.   

## 2017-10-03 ENCOUNTER — Telehealth: Payer: Self-pay | Admitting: Hematology and Oncology

## 2017-10-03 NOTE — Telephone Encounter (Signed)
Patient called in and requested appts be r/s to mondays - scheduled 1/15 appt to 1/14 left message for patient to let them know we can add them to Monday just need a call back to confirm .

## 2017-10-08 ENCOUNTER — Other Ambulatory Visit: Payer: Self-pay

## 2017-10-08 ENCOUNTER — Ambulatory Visit: Payer: Self-pay | Admitting: Hematology and Oncology

## 2017-10-08 ENCOUNTER — Ambulatory Visit: Payer: Self-pay

## 2017-10-09 ENCOUNTER — Inpatient Hospital Stay: Payer: Medicaid Other

## 2017-10-09 ENCOUNTER — Inpatient Hospital Stay (HOSPITAL_BASED_OUTPATIENT_CLINIC_OR_DEPARTMENT_OTHER): Payer: Medicaid Other | Admitting: Hematology and Oncology

## 2017-10-09 DIAGNOSIS — R197 Diarrhea, unspecified: Secondary | ICD-10-CM

## 2017-10-09 DIAGNOSIS — C50411 Malignant neoplasm of upper-outer quadrant of right female breast: Secondary | ICD-10-CM | POA: Diagnosis not present

## 2017-10-09 DIAGNOSIS — R42 Dizziness and giddiness: Secondary | ICD-10-CM

## 2017-10-09 DIAGNOSIS — L658 Other specified nonscarring hair loss: Secondary | ICD-10-CM | POA: Diagnosis not present

## 2017-10-09 DIAGNOSIS — Z171 Estrogen receptor negative status [ER-]: Principal | ICD-10-CM

## 2017-10-09 DIAGNOSIS — D6481 Anemia due to antineoplastic chemotherapy: Secondary | ICD-10-CM | POA: Diagnosis not present

## 2017-10-09 DIAGNOSIS — Z5111 Encounter for antineoplastic chemotherapy: Secondary | ICD-10-CM | POA: Diagnosis not present

## 2017-10-09 DIAGNOSIS — Z79899 Other long term (current) drug therapy: Secondary | ICD-10-CM | POA: Diagnosis not present

## 2017-10-09 DIAGNOSIS — R5383 Other fatigue: Secondary | ICD-10-CM | POA: Diagnosis not present

## 2017-10-09 DIAGNOSIS — T451X5S Adverse effect of antineoplastic and immunosuppressive drugs, sequela: Secondary | ICD-10-CM

## 2017-10-09 LAB — CBC WITH DIFFERENTIAL/PLATELET
BASOS ABS: 0 10*3/uL (ref 0.0–0.1)
Basophils Relative: 1 %
Eosinophils Absolute: 0.1 10*3/uL (ref 0.0–0.5)
Eosinophils Relative: 2 %
HEMATOCRIT: 30.6 % — AB (ref 34.8–46.6)
Hemoglobin: 10.1 g/dL — ABNORMAL LOW (ref 11.6–15.9)
LYMPHS PCT: 40 %
Lymphs Abs: 2.6 10*3/uL (ref 0.9–3.3)
MCH: 27.1 pg (ref 25.1–34.0)
MCHC: 32.9 g/dL (ref 31.5–36.0)
MCV: 82.4 fL (ref 79.5–101.0)
MONO ABS: 0.4 10*3/uL (ref 0.1–0.9)
Monocytes Relative: 6 %
NEUTROS ABS: 3.4 10*3/uL (ref 1.5–6.5)
Neutrophils Relative %: 51 %
Platelets: 264 10*3/uL (ref 145–400)
RBC: 3.72 MIL/uL (ref 3.70–5.45)
RDW: 15.1 % (ref 11.2–16.1)
WBC: 6.5 10*3/uL (ref 3.9–10.3)

## 2017-10-09 LAB — COMPREHENSIVE METABOLIC PANEL
ALT: 31 U/L (ref 0–55)
AST: 16 U/L (ref 5–34)
Albumin: 3.8 g/dL (ref 3.5–5.0)
Alkaline Phosphatase: 79 U/L (ref 40–150)
Anion gap: 5 (ref 3–11)
BUN: 7 mg/dL (ref 7–26)
CALCIUM: 8.8 mg/dL (ref 8.4–10.4)
CO2: 26 mmol/L (ref 22–29)
Chloride: 110 mmol/L — ABNORMAL HIGH (ref 98–109)
Creatinine, Ser: 0.67 mg/dL (ref 0.60–1.10)
GFR calc Af Amer: >60 mL/min (ref >60)
Glucose, Bld: 96 mg/dL (ref 70–140)
POTASSIUM: 3.9 mmol/L (ref 3.3–4.7)
Sodium: 141 mmol/L (ref 136–145)
TOTAL PROTEIN: 6.6 g/dL (ref 6.4–8.3)

## 2017-10-09 MED ORDER — FAMOTIDINE IN NACL 20-0.9 MG/50ML-% IV SOLN
INTRAVENOUS | Status: AC
  Filled 2017-10-09: qty 50

## 2017-10-09 MED ORDER — DIPHENHYDRAMINE HCL 50 MG/ML IJ SOLN
INTRAMUSCULAR | Status: AC
  Filled 2017-10-09: qty 1

## 2017-10-09 MED ORDER — TRASTUZUMAB CHEMO 150 MG IV SOLR
150.0000 mg | Freq: Once | INTRAVENOUS | Status: AC
  Administered 2017-10-09: 150 mg via INTRAVENOUS
  Filled 2017-10-09: qty 7.14

## 2017-10-09 MED ORDER — SODIUM CHLORIDE 0.9 % IV SOLN
Freq: Once | INTRAVENOUS | Status: AC
  Administered 2017-10-09: 15:00:00 via INTRAVENOUS

## 2017-10-09 MED ORDER — SODIUM CHLORIDE 0.9% FLUSH
10.0000 mL | INTRAVENOUS | Status: DC | PRN
  Administered 2017-10-09: 10 mL
  Filled 2017-10-09: qty 10

## 2017-10-09 MED ORDER — ACETAMINOPHEN 325 MG PO TABS
650.0000 mg | ORAL_TABLET | Freq: Once | ORAL | Status: AC
  Administered 2017-10-09: 650 mg via ORAL

## 2017-10-09 MED ORDER — SODIUM CHLORIDE 0.9 % IV SOLN
80.0000 mg/m2 | Freq: Once | INTRAVENOUS | Status: AC
  Administered 2017-10-09: 144 mg via INTRAVENOUS
  Filled 2017-10-09: qty 24

## 2017-10-09 MED ORDER — ACETAMINOPHEN 325 MG PO TABS
ORAL_TABLET | ORAL | Status: AC
  Filled 2017-10-09: qty 2

## 2017-10-09 MED ORDER — HEPARIN SOD (PORK) LOCK FLUSH 100 UNIT/ML IV SOLN
500.0000 [IU] | Freq: Once | INTRAVENOUS | Status: AC | PRN
  Administered 2017-10-09: 500 [IU]
  Filled 2017-10-09: qty 5

## 2017-10-09 MED ORDER — FAMOTIDINE IN NACL 20-0.9 MG/50ML-% IV SOLN
20.0000 mg | Freq: Once | INTRAVENOUS | Status: AC
  Administered 2017-10-09: 20 mg via INTRAVENOUS

## 2017-10-09 MED ORDER — DIPHENHYDRAMINE HCL 50 MG/ML IJ SOLN
50.0000 mg | Freq: Once | INTRAMUSCULAR | Status: AC
  Administered 2017-10-09: 50 mg via INTRAVENOUS

## 2017-10-09 MED ORDER — SODIUM CHLORIDE 0.9 % IV SOLN
20.0000 mg | Freq: Once | INTRAVENOUS | Status: AC
  Administered 2017-10-09: 20 mg via INTRAVENOUS
  Filled 2017-10-09: qty 2

## 2017-10-09 NOTE — Assessment & Plan Note (Signed)
07/24/2017:Right lumpectomy: IDC grade 3, 1.8 cm, 0/2 lymph nodes negative,ER 0%, PR 0%, Ki-67 40%, HER-2 positive ratio 3.59, T1CN0 stage I a  Pathology counseling: I discussed the final pathology report of the patient provided  a copy of this report. I discussed the margins as well as lymph node surgeries. We also discussed the final staging along with previously performed ER/PR and HER-2/neu testing.  Recommendation: 1.  Adjuvant chemotherapy with Taxol Herceptin followed by Herceptin maintenance for 1 year 2. followed by adjuvant radiation therapy ------------------------------------------------------------------------------------------------------------------------------------ Current treatment: Cycle 5 Taxol Herceptin Labs reviewed Echocardiogram 08/03/2017: EF 60-65%  Chemo toxicities: 1.Severe diarrhea:  I instructed her to start taking Lomotil and Imodium.  I gave her instructions on how to take them. 2. fatigue related to chemotherapy   Return to clinic weekly for chemo and every 2 weeks for follow-up with me.

## 2017-10-09 NOTE — Progress Notes (Signed)
Patient Care Team: [name], MD as PCP - General (Family Medicine) Erroll Luna, MD as Consulting Physician (General Surgery)  DIAGNOSIS:  Encounter Diagnosis  Name Primary?  . Malignant neoplasm of upper-outer quadrant of right breast in female, estrogen receptor negative ([place])     SUMMARY OF ONCOLOGIC HISTORY:   Malignant neoplasm of upper-outer quadrant of right breast in female, estrogen receptor negative ([place])   06/26/2017 Initial Diagnosis    Right breast biopsy 8:30 position 8 cm from nipple: IDC with DCIS, second biopsy 6 cm from nipple fibrocystic changes: Grade 3, ER 0%, PR 0%, Ki-67 40%, HER-2 positive ratio 3.59; mammogram and ultrasound revealed 1.9 cm mass at 8:30 position, adjacent 0.7 cm cystic mass, T1c N0 stage IA AJCC 8       07/24/2017 Surgery    Right lumpectomy: IDC grade 3, 1.8 cm, 0/2 lymph nodes negative,ER 0%, PR 0%, Ki-67 40%, HER-2 positive ratio 3.59, T1CN0 stage I a       09/10/2017 -  Chemotherapy    Taxol Herceptin weekly x12 followed by Herceptin maintenance for 1 year       CHIEF COMPLIANT: Cycle 5 Taxol Herceptin  INTERVAL HISTORY: Katherine [name] is a 47 year old with above-mentioned history of right breast cancer is currently on adjuvant chemotherapy with Taxol and Herceptin.  Today cycle 5 of treatment.  She reports no neuropathy.  Denies any nausea or vomiting.    Complains of intermittent lightheadedness  REVIEW OF SYSTEMS:   Constitutional: Denies fevers, chills or abnormal weight loss Eyes: Denies blurriness of vision Ears, nose, mouth, throat, and face: Denies mucositis or sore throat Respiratory: Denies cough, dyspnea or wheezes Cardiovascular: Denies palpitation, chest discomfort Gastrointestinal:  Denies nausea, heartburn or change in bowel habits Skin: Denies abnormal skin rashes Lymphatics: Denies new lymphadenopathy or easy bruising Neurological:Denies numbness, tingling or new weaknesses Behavioral/Psych: Mood is  stable, no new changes  Extremities: No lower extremity edema Breast:  denies any pain or lumps or nodules in either breasts All other systems were reviewed with the patient and are negative.  I have reviewed the past medical history, past surgical history, social history and family history with the patient and they are unchanged from previous note.  ALLERGIES:  is allergic to morphine; shellfish allergy; penicillins; codeine; hydrocodone; and tomato.  MEDICATIONS:  Current Outpatient Medications  Medication Sig Dispense Refill  . albuterol (PROVENTIL HFA;VENTOLIN HFA) 108 (90 Base) MCG/ACT inhaler Inhale 2 puffs into the lungs every 6 (six) hours as needed for wheezing or shortness of breath.    . diphenoxylate-atropine (LOMOTIL) 2.5-0.025 MG tablet Take 1 tablet by mouth 4 (four) times daily as needed for diarrhea or loose stools. 30 tablet 0  . ibuprofen (ADVIL,MOTRIN) 800 MG tablet Take 1 tablet (800 mg total) by mouth every 8 (eight) hours as needed. (Patient taking differently: Take 800 mg by mouth every 8 (eight) hours as needed for moderate pain. ) 30 tablet 0  . ipratropium (ATROVENT) 0.03 % nasal spray SPRAY TWO SQUIRTS INTO EACH NOSTRIL TWICE A DAY  1  . lidocaine-prilocaine (EMLA) cream Apply to affected area once 30 g 3  . LORazepam (ATIVAN) 0.5 MG tablet Take 1 tablet (0.5 mg total) at bedtime as needed by mouth for sleep. (Patient not taking: Reported on 09/21/2017) 30 tablet 0  . ondansetron (ZOFRAN) 8 MG tablet Take 1 tablet (8 mg total) 2 (two) times daily as needed by mouth (Nausea or vomiting). 30 tablet 1  . pantoprazole (PROTONIX) 40  MG tablet Take 40 mg by mouth daily.    . prochlorperazine (COMPAZINE) 10 MG tablet Take 1 tablet (10 mg total) every 6 (six) hours as needed by mouth (Nausea or vomiting). 30 tablet 1   No current facility-administered medications for this visit.     PHYSICAL EXAMINATION: ECOG PERFORMANCE STATUS: 1 - Symptomatic but completely  ambulatory  Vitals:   10/09/17 1400  BP: 117/82  Pulse: 63  Resp: 18  Temp: 98.4 F (36.9 C)  SpO2: 100%   Filed Weights   10/09/17 1400  Weight: 160 lb 3.2 oz (72.7 kg)    GENERAL:alert, no distress and comfortable SKIN: skin color, texture, turgor are normal, no rashes or significant lesions EYES: normal, Conjunctiva are pink and non-injected, sclera clear OROPHARYNX:no exudate, no erythema and lips, buccal mucosa, and tongue normal  NECK: supple, thyroid normal size, non-tender, without nodularity LYMPH:  no palpable lymphadenopathy in the cervical, axillary or inguinal LUNGS: clear to auscultation and percussion with normal breathing effort HEART: regular rate & rhythm and no murmurs and no lower extremity edema ABDOMEN:abdomen soft, non-tender and normal bowel sounds MUSCULOSKELETAL:no cyanosis of digits and no clubbing  NEURO: alert & oriented x 3 with fluent speech, no focal motor/sensory deficits EXTREMITIES: No lower extremity edema   LABORATORY DATA:  I have reviewed the data as listed CMP Latest Ref Rng & Units 10/01/2017 09/24/2017 09/21/2017  Glucose 70 - 140 mg/dL 98 104 91  BUN 7 - 26 mg/dL 9 11.7 10  Creatinine 0.60 - 1.10 mg/dL 0.68 0.7 0.55  Sodium 136 - 145 mmol/L 138 140 136  Potassium 3.3 - 4.7 mmol/L 4.0 3.8 3.8  Chloride 98 - 109 mmol/L 108 - 103  CO2 22 - 29 mmol/L '25 24 26  ' Calcium 8.4 - 10.4 mg/dL 8.8 9.0 8.8(L)  Total Protein 6.4 - 8.3 g/dL 6.6 7.2 6.8  Total Bilirubin 0.2 - 1.2 mg/dL <0.2(L) 0.41 0.6  Alkaline Phos 40 - 150 U/L 87 81 67  AST 5 - 34 U/L '27 25 20  ' ALT 0 - 55 U/L 36 39 21    Lab Results  Component Value Date   WBC 6.5 10/09/2017   HGB 10.1 (L) 10/09/2017   HCT 30.6 (L) 10/09/2017   MCV 82.4 10/09/2017   PLT 264 10/09/2017   NEUTROABS 3.4 10/09/2017    ASSESSMENT & PLAN:  Malignant neoplasm of upper-outer quadrant of right breast in female, estrogen receptor negative ([facility]) 07/24/2017:Right lumpectomy: IDC grade 3, 1.8  cm, 0/2 lymph nodes negative,ER 0%, PR 0%, Ki-67 40%, HER-2 positive ratio 3.59, T1CN0 stage I a  Pathology counseling: I discussed the final pathology report of the patient provided  a copy of this report. I discussed the margins as well as lymph node surgeries. We also discussed the final staging along with previously performed ER/PR and HER-2/neu testing.  Recommendation: 1.  Adjuvant chemotherapy with Taxol Herceptin followed by Herceptin maintenance for 1 year 2. followed by adjuvant radiation therapy ------------------------------------------------------------------------------------------------------------------------------------ Current treatment: Cycle  5 Taxol Herceptin Labs reviewed Echocardiogram 08/03/2017: EF 60-65%  Chemo toxicities: 1.Severe diarrhea:  I instructed her to start taking Lomotil and Imodium.  I gave her instructions on how to take them. 2. fatigue related to chemotherapy 3. Alopecia 4. Intermittent lightheadedness 5. Chemotherapy-induced anemia  Return to clinic weekly for chemo and every 2 weeks for follow-up with me.     I spent 25 minutes talking to the patient of which more than half was spent in counseling and  coordination of care.  No orders of the defined types were placed in this encounter.  The patient has a good understanding of the overall plan. she agrees with it. she will call with any problems that may develop before the next visit here.   Harriette Ohara, MD 10/09/17

## 2017-10-09 NOTE — Patient Instructions (Signed)
[facility] Discharge Instructions for Patients Receiving Chemotherapy  Today you received the following chemotherapy agents as Herceptin; Paclitaxel  To help prevent nausea and vomiting after your treatment, we encourage you to take your nausea medication as directed   If you develop nausea and vomiting that is not controlled by your nausea medication, call the clinic.   BELOW ARE SYMPTOMS THAT SHOULD BE REPORTED IMMEDIATELY:  *FEVER GREATER THAN 100.5 F  *CHILLS WITH OR WITHOUT FEVER  NAUSEA AND VOMITING THAT IS NOT CONTROLLED WITH YOUR NAUSEA MEDICATION  *UNUSUAL SHORTNESS OF BREATH  *UNUSUAL BRUISING OR BLEEDING  TENDERNESS IN MOUTH AND THROAT WITH OR WITHOUT PRESENCE OF ULCERS  *URINARY PROBLEMS  *BOWEL PROBLEMS  UNUSUAL RASH Items with * indicate a potential emergency and should be followed up as soon as possible.  Feel free to call the clinic should you have any questions or concerns. The clinic phone number is [phone].  Please show the [facility] at check-in to the Emergency Department and triage nurse.

## 2017-10-10 ENCOUNTER — Telehealth: Payer: Self-pay | Admitting: Hematology and Oncology

## 2017-10-10 NOTE — Telephone Encounter (Signed)
No 11/5 los.  °

## 2017-10-11 ENCOUNTER — Other Ambulatory Visit: Payer: Self-pay | Admitting: Hematology and Oncology

## 2017-10-15 ENCOUNTER — Inpatient Hospital Stay: Payer: Medicaid Other

## 2017-10-15 ENCOUNTER — Other Ambulatory Visit: Payer: Self-pay

## 2017-10-15 VITALS — BP 134/90 | HR 66 | Temp 97.9°F | Resp 17

## 2017-10-15 DIAGNOSIS — C50411 Malignant neoplasm of upper-outer quadrant of right female breast: Secondary | ICD-10-CM

## 2017-10-15 DIAGNOSIS — Z171 Estrogen receptor negative status [ER-]: Principal | ICD-10-CM

## 2017-10-15 DIAGNOSIS — Z5111 Encounter for antineoplastic chemotherapy: Secondary | ICD-10-CM | POA: Diagnosis not present

## 2017-10-15 DIAGNOSIS — Z95828 Presence of other vascular implants and grafts: Secondary | ICD-10-CM

## 2017-10-15 LAB — CBC WITH DIFFERENTIAL/PLATELET
BASOS ABS: 0 10*3/uL (ref 0.0–0.1)
Basophils Relative: 1 %
Eosinophils Absolute: 0.1 10*3/uL (ref 0.0–0.5)
Eosinophils Relative: 1 %
HEMATOCRIT: 33.9 % — AB (ref 34.8–46.6)
Hemoglobin: 11.1 g/dL — ABNORMAL LOW (ref 11.6–15.9)
LYMPHS ABS: 2.2 10*3/uL (ref 0.9–3.3)
LYMPHS PCT: 40 %
MCH: 26.9 pg (ref 25.1–34.0)
MCHC: 32.7 g/dL (ref 31.5–36.0)
MCV: 82.5 fL (ref 79.5–101.0)
Monocytes Absolute: 0.2 10*3/uL (ref 0.1–0.9)
Monocytes Relative: 4 %
NEUTROS ABS: 2.9 10*3/uL (ref 1.5–6.5)
Neutrophils Relative %: 54 %
Platelets: 263 10*3/uL (ref 145–400)
RBC: 4.1 MIL/uL (ref 3.70–5.45)
RDW: 15.4 % (ref 11.2–16.1)
WBC: 5.5 10*3/uL (ref 3.9–10.3)

## 2017-10-15 LAB — COMPREHENSIVE METABOLIC PANEL
ALT: 58 U/L — AB (ref 0–55)
AST: 40 U/L — AB (ref 5–34)
Albumin: 3.9 g/dL (ref 3.5–5.0)
Alkaline Phosphatase: 84 U/L (ref 40–150)
Anion gap: 8 (ref 3–11)
BILIRUBIN TOTAL: 0.4 mg/dL (ref 0.2–1.2)
BUN: 11 mg/dL (ref 7–26)
CO2: 27 mmol/L (ref 22–29)
CREATININE: 0.73 mg/dL (ref 0.60–1.10)
Calcium: 9 mg/dL (ref 8.4–10.4)
Chloride: 106 mmol/L (ref 98–109)
Glucose, Bld: 99 mg/dL (ref 70–140)
Potassium: 3.6 mmol/L (ref 3.3–4.7)
Sodium: 141 mmol/L (ref 136–145)
TOTAL PROTEIN: 6.9 g/dL (ref 6.4–8.3)

## 2017-10-15 MED ORDER — LIDOCAINE-PRILOCAINE 2.5-2.5 % EX CREA: TOPICAL_CREAM | CUTANEOUS | 3 refills | Status: DC

## 2017-10-15 MED ORDER — SODIUM CHLORIDE 0.9% FLUSH
10.0000 mL | INTRAVENOUS | Status: DC | PRN
  Administered 2017-10-15: 10 mL via INTRAVENOUS
  Filled 2017-10-15: qty 10

## 2017-10-15 MED ORDER — PACLITAXEL CHEMO INJECTION 300 MG/50ML
80.0000 mg/m2 | Freq: Once | INTRAVENOUS | Status: AC
  Administered 2017-10-15: 144 mg via INTRAVENOUS
  Filled 2017-10-15: qty 24

## 2017-10-15 MED ORDER — DIPHENHYDRAMINE HCL 50 MG/ML IJ SOLN
INTRAMUSCULAR | Status: AC
  Filled 2017-10-15: qty 1

## 2017-10-15 MED ORDER — SODIUM CHLORIDE 0.9 % IV SOLN
Freq: Once | INTRAVENOUS | Status: AC
  Administered 2017-10-15: 13:00:00 via INTRAVENOUS

## 2017-10-15 MED ORDER — ACETAMINOPHEN 325 MG PO TABS
650.0000 mg | ORAL_TABLET | Freq: Once | ORAL | Status: AC
  Administered 2017-10-15: 650 mg via ORAL

## 2017-10-15 MED ORDER — FAMOTIDINE IN NACL 20-0.9 MG/50ML-% IV SOLN
INTRAVENOUS | Status: AC
Start: 2017-10-15 — End: 2017-10-15
  Filled 2017-10-15: qty 50

## 2017-10-15 MED ORDER — TRASTUZUMAB CHEMO 150 MG IV SOLR
150.0000 mg | Freq: Once | INTRAVENOUS | Status: AC
  Administered 2017-10-15: 150 mg via INTRAVENOUS
  Filled 2017-10-15: qty 7.14

## 2017-10-15 MED ORDER — ACETAMINOPHEN 325 MG PO TABS
ORAL_TABLET | ORAL | Status: AC
  Filled 2017-10-15: qty 2

## 2017-10-15 MED ORDER — FAMOTIDINE IN NACL 20-0.9 MG/50ML-% IV SOLN
20.0000 mg | Freq: Once | INTRAVENOUS | Status: AC
  Administered 2017-10-15: 20 mg via INTRAVENOUS

## 2017-10-15 MED ORDER — HEPARIN SOD (PORK) LOCK FLUSH 100 UNIT/ML IV SOLN
500.0000 [IU] | Freq: Once | INTRAVENOUS | Status: AC | PRN
  Administered 2017-10-15: 500 [IU]
  Filled 2017-10-15: qty 5

## 2017-10-15 MED ORDER — SODIUM CHLORIDE 0.9 % IV SOLN
20.0000 mg | Freq: Once | INTRAVENOUS | Status: AC
  Administered 2017-10-15: 20 mg via INTRAVENOUS
  Filled 2017-10-15: qty 2

## 2017-10-15 MED ORDER — DIPHENHYDRAMINE HCL 50 MG/ML IJ SOLN
50.0000 mg | Freq: Once | INTRAMUSCULAR | Status: AC
  Administered 2017-10-15: 50 mg via INTRAVENOUS

## 2017-10-15 MED ORDER — SODIUM CHLORIDE 0.9% FLUSH
10.0000 mL | INTRAVENOUS | Status: DC | PRN
  Administered 2017-10-15: 10 mL
  Filled 2017-10-15: qty 10

## 2017-10-15 MED FILL — LIDOCAINE-PRILOCAINE CREAM: 2.5-2.5 | 10 days supply | Qty: 30 | Fill #0

## 2017-10-15 NOTE — Patient Instructions (Signed)

## 2017-10-15 NOTE — Patient Instructions (Signed)
[facility] Discharge Instructions for Patients Receiving Chemotherapy  Today you received the following chemotherapy agents :  Herceptin,  Taxol.  To help prevent nausea and vomiting after your treatment, we encourage you to take your nausea medication as prescribed.   If you develop nausea and vomiting that is not controlled by your nausea medication, call the clinic.   BELOW ARE SYMPTOMS THAT SHOULD BE REPORTED IMMEDIATELY:  *FEVER GREATER THAN 100.5 F  *CHILLS WITH OR WITHOUT FEVER  NAUSEA AND VOMITING THAT IS NOT CONTROLLED WITH YOUR NAUSEA MEDICATION  *UNUSUAL SHORTNESS OF BREATH  *UNUSUAL BRUISING OR BLEEDING  TENDERNESS IN MOUTH AND THROAT WITH OR WITHOUT PRESENCE OF ULCERS  *URINARY PROBLEMS  *BOWEL PROBLEMS  UNUSUAL RASH Items with * indicate a potential emergency and should be followed up as soon as possible.  Feel free to call the clinic should you have any questions or concerns. The clinic phone number is [phone].  Please show the CHEMO ALERT CARD at check-in to the Emergency Department and triage nurse.   

## 2017-10-21 NOTE — Assessment & Plan Note (Signed)
07/24/2017:Right lumpectomy: IDC grade 3, 1.8 cm, 0/2 lymph nodes negative,ER 0%, PR 0%, Ki-67 40%, HER-2 positive ratio 3.59, T1CN0 stage I a  Pathology counseling: I discussed the final pathology report of the patient provided a copy of this report. I discussed the margins as well as lymph node surgeries. We also discussed the final staging along with previously performed ER/PR and HER-2/neu testing.  Recommendation: 1. Adjuvant chemotherapy with Taxol Herceptin followed by Herceptin maintenance for 1 year 2. followed by adjuvant radiation therapy ------------------------------------------------------------------------------------------------------------------------------------ Current treatment:Cycle  7Taxol Herceptin Labs reviewed Echocardiogram 08/03/2017: EF 60-65%  Chemo toxicities: 1.Severe diarrhea: I instructed her to start taking Lomotil and Imodium. I gave her instructions on how to take them. 2.fatigue related to chemotherapy 3. Alopecia 4. Intermittent lightheadedness 5. Chemotherapy-induced anemia  Return to clinicweekly for chemo and every 2 weeks for follow-up withme.

## 2017-10-22 ENCOUNTER — Inpatient Hospital Stay: Payer: Medicaid Other

## 2017-10-22 ENCOUNTER — Inpatient Hospital Stay (HOSPITAL_BASED_OUTPATIENT_CLINIC_OR_DEPARTMENT_OTHER): Payer: Medicaid Other | Admitting: Hematology and Oncology

## 2017-10-22 ENCOUNTER — Encounter: Payer: Self-pay | Admitting: *Deleted

## 2017-10-22 DIAGNOSIS — Z171 Estrogen receptor negative status [ER-]: Principal | ICD-10-CM

## 2017-10-22 DIAGNOSIS — Z79899 Other long term (current) drug therapy: Secondary | ICD-10-CM | POA: Diagnosis not present

## 2017-10-22 DIAGNOSIS — G629 Polyneuropathy, unspecified: Secondary | ICD-10-CM

## 2017-10-22 DIAGNOSIS — Z95828 Presence of other vascular implants and grafts: Secondary | ICD-10-CM

## 2017-10-22 DIAGNOSIS — L658 Other specified nonscarring hair loss: Secondary | ICD-10-CM

## 2017-10-22 DIAGNOSIS — C50411 Malignant neoplasm of upper-outer quadrant of right female breast: Secondary | ICD-10-CM

## 2017-10-22 DIAGNOSIS — Z880 Allergy status to penicillin: Secondary | ICD-10-CM | POA: Diagnosis not present

## 2017-10-22 DIAGNOSIS — Z5111 Encounter for antineoplastic chemotherapy: Secondary | ICD-10-CM | POA: Diagnosis not present

## 2017-10-22 DIAGNOSIS — D6481 Anemia due to antineoplastic chemotherapy: Secondary | ICD-10-CM | POA: Diagnosis not present

## 2017-10-22 DIAGNOSIS — R197 Diarrhea, unspecified: Secondary | ICD-10-CM | POA: Diagnosis not present

## 2017-10-22 DIAGNOSIS — Z885 Allergy status to narcotic agent status: Secondary | ICD-10-CM

## 2017-10-22 DIAGNOSIS — T451X5S Adverse effect of antineoplastic and immunosuppressive drugs, sequela: Secondary | ICD-10-CM

## 2017-10-22 DIAGNOSIS — R42 Dizziness and giddiness: Secondary | ICD-10-CM

## 2017-10-22 LAB — CBC WITH DIFFERENTIAL/PLATELET
BASOS ABS: 0 10*3/uL (ref 0.0–0.1)
BASOS PCT: 1 %
Eosinophils Absolute: 0.1 10*3/uL (ref 0.0–0.5)
Eosinophils Relative: 2 %
HEMATOCRIT: 35.2 % (ref 34.8–46.6)
Hemoglobin: 11.6 g/dL (ref 11.6–15.9)
LYMPHS PCT: 36 %
Lymphs Abs: 2.4 10*3/uL (ref 0.9–3.3)
MCH: 27.2 pg (ref 25.1–34.0)
MCHC: 32.9 g/dL (ref 31.5–36.0)
MCV: 82.9 fL (ref 79.5–101.0)
MONO ABS: 0.4 10*3/uL (ref 0.1–0.9)
MONOS PCT: 6 %
NEUTROS ABS: 3.7 10*3/uL (ref 1.5–6.5)
NEUTROS PCT: 55 %
Platelets: 275 10*3/uL (ref 145–400)
RBC: 4.25 MIL/uL (ref 3.70–5.45)
RDW: 16.2 % — AB (ref 11.2–16.1)
WBC: 6.6 10*3/uL (ref 3.9–10.3)

## 2017-10-22 LAB — COMPREHENSIVE METABOLIC PANEL
ALBUMIN: 4.1 g/dL (ref 3.5–5.0)
ALK PHOS: 91 U/L (ref 40–150)
ALT: 25 U/L (ref 0–55)
ANION GAP: 7 (ref 3–11)
AST: 16 U/L (ref 5–34)
BUN: 12 mg/dL (ref 7–26)
CALCIUM: 9.5 mg/dL (ref 8.4–10.4)
CO2: 26 mmol/L (ref 22–29)
Chloride: 109 mmol/L (ref 98–109)
Creatinine, Ser: 0.79 mg/dL (ref 0.60–1.10)
GFR calc non Af Amer: >60 mL/min (ref >60)
GLUCOSE: 106 mg/dL (ref 70–140)
POTASSIUM: 4.1 mmol/L (ref 3.3–4.7)
SODIUM: 142 mmol/L (ref 136–145)
TOTAL PROTEIN: 7.4 g/dL (ref 6.4–8.3)
Total Bilirubin: <0.2 mg/dL — ABNORMAL LOW (ref 0.2–1.2)

## 2017-10-22 MED ORDER — SODIUM CHLORIDE 0.9 % IV SOLN
20.0000 mg | Freq: Once | INTRAVENOUS | Status: AC
  Administered 2017-10-22: 20 mg via INTRAVENOUS
  Filled 2017-10-22: qty 2

## 2017-10-22 MED ORDER — SODIUM CHLORIDE 0.9 % IV SOLN
80.0000 mg/m2 | Freq: Once | INTRAVENOUS | Status: AC
  Administered 2017-10-22: 144 mg via INTRAVENOUS
  Filled 2017-10-22: qty 24

## 2017-10-22 MED ORDER — ACETAMINOPHEN 325 MG PO TABS
650.0000 mg | ORAL_TABLET | Freq: Once | ORAL | Status: AC
  Administered 2017-10-22: 650 mg via ORAL

## 2017-10-22 MED ORDER — SODIUM CHLORIDE 0.9% FLUSH
10.0000 mL | INTRAVENOUS | Status: DC | PRN
  Administered 2017-10-22: 10 mL via INTRAVENOUS
  Filled 2017-10-22: qty 10

## 2017-10-22 MED ORDER — SODIUM CHLORIDE 0.9% FLUSH
10.0000 mL | INTRAVENOUS | Status: DC | PRN
  Administered 2017-10-22: 10 mL
  Filled 2017-10-22: qty 10

## 2017-10-22 MED ORDER — DIPHENHYDRAMINE HCL 50 MG/ML IJ SOLN
50.0000 mg | Freq: Once | INTRAMUSCULAR | Status: AC
Start: 2017-10-22 — End: 2017-10-22
  Administered 2017-10-22: 50 mg via INTRAVENOUS

## 2017-10-22 MED ORDER — DIPHENHYDRAMINE HCL 50 MG/ML IJ SOLN
INTRAMUSCULAR | Status: AC
  Filled 2017-10-22: qty 1

## 2017-10-22 MED ORDER — HEPARIN SOD (PORK) LOCK FLUSH 100 UNIT/ML IV SOLN
500.0000 [IU] | Freq: Once | INTRAVENOUS | Status: AC | PRN
  Administered 2017-10-22: 500 [IU]
  Filled 2017-10-22: qty 5

## 2017-10-22 MED ORDER — ACETAMINOPHEN 325 MG PO TABS
ORAL_TABLET | ORAL | Status: AC
  Filled 2017-10-22: qty 2

## 2017-10-22 MED ORDER — TRASTUZUMAB CHEMO 150 MG IV SOLR
150.0000 mg | Freq: Once | INTRAVENOUS | Status: AC
  Administered 2017-10-22: 150 mg via INTRAVENOUS
  Filled 2017-10-22: qty 7.14

## 2017-10-22 MED ORDER — FAMOTIDINE IN NACL 20-0.9 MG/50ML-% IV SOLN
20.0000 mg | Freq: Once | INTRAVENOUS | Status: AC
  Administered 2017-10-22: 20 mg via INTRAVENOUS

## 2017-10-22 MED ORDER — FAMOTIDINE IN NACL 20-0.9 MG/50ML-% IV SOLN
INTRAVENOUS | Status: AC
  Filled 2017-10-22: qty 50

## 2017-10-22 MED ORDER — SODIUM CHLORIDE 0.9 % IV SOLN
Freq: Once | INTRAVENOUS | Status: AC
  Administered 2017-10-22: 14:00:00 via INTRAVENOUS

## 2017-10-22 NOTE — Progress Notes (Signed)
Patient Care Team: [name], MD as PCP - General (Family Medicine) Erroll Luna, MD as Consulting Physician (General Surgery)  DIAGNOSIS:  Encounter Diagnosis  Name Primary?  . Malignant neoplasm of upper-outer quadrant of right breast in female, estrogen receptor negative ([place])     SUMMARY OF ONCOLOGIC HISTORY:   Malignant neoplasm of upper-outer quadrant of right breast in female, estrogen receptor negative ([place])   06/26/2017 Initial Diagnosis    Right breast biopsy 8:30 position 8 cm from nipple: IDC with DCIS, second biopsy 6 cm from nipple fibrocystic changes: Grade 3, ER 0%, PR 0%, Ki-67 40%, HER-2 positive ratio 3.59; mammogram and ultrasound revealed 1.9 cm mass at 8:30 position, adjacent 0.7 cm cystic mass, T1c N0 stage IA AJCC 8       07/24/2017 Surgery    Right lumpectomy: IDC grade 3, 1.8 cm, 0/2 lymph nodes negative,ER 0%, PR 0%, Ki-67 40%, HER-2 positive ratio 3.59, T1CN0 stage I a       09/10/2017 -  Chemotherapy    Taxol Herceptin weekly x12 followed by Herceptin maintenance for 1 year       CHIEF COMPLIANT: Cycle 7 Taxol Herceptin  INTERVAL HISTORY: Katherine [name] is a 47 year old with above-mentioned his right breast cancer who underwent lumpectomy and is currently on adjuvant chemotherapy with Taxol and Herceptin.  She is tolerated the chemo fairly well.  She does not have any nausea vomiting.  She does have mild occasional tingling and numbness but do not stay.  REVIEW OF SYSTEMS:   Constitutional: Denies fevers, chills or abnormal weight loss Eyes: Denies blurriness of vision Ears, nose, mouth, throat, and face: Denies mucositis or sore throat Respiratory: Denies cough, dyspnea or wheezes Cardiovascular: Denies palpitation, chest discomfort Gastrointestinal:  Denies nausea, heartburn or change in bowel habits Skin: Denies abnormal skin rashes Lymphatics: Denies new lymphadenopathy or easy bruising Neurological: Mild peripheral  neuropathy Behavioral/Psych: Mood is stable, no new changes  Extremities: No lower extremity edema Breast:  denies any pain or lumps or nodules in either breasts All other systems were reviewed with the patient and are negative.  I have reviewed the past medical history, past surgical history, social history and family history with the patient and they are unchanged from previous note.  ALLERGIES:  is allergic to morphine; shellfish allergy; penicillins; codeine; hydrocodone; and tomato.  MEDICATIONS:  Current Outpatient Medications  Medication Sig Dispense Refill  . albuterol (PROVENTIL HFA;VENTOLIN HFA) 108 (90 Base) MCG/ACT inhaler Inhale 2 puffs into the lungs every 6 (six) hours as needed for wheezing or shortness of breath.    . diphenoxylate-atropine (LOMOTIL) 2.5-0.025 MG tablet Take 1 tablet by mouth 4 (four) times daily as needed for diarrhea or loose stools. 30 tablet 0  . ibuprofen (ADVIL,MOTRIN) 800 MG tablet Take 1 tablet (800 mg total) by mouth every 8 (eight) hours as needed. (Patient taking differently: Take 800 mg by mouth every 8 (eight) hours as needed for moderate pain. ) 30 tablet 0  . ipratropium (ATROVENT) 0.03 % nasal spray SPRAY TWO SQUIRTS INTO EACH NOSTRIL TWICE A DAY  1  . lidocaine-prilocaine (EMLA) cream Apply to affected area once 30 g 3  . LORazepam (ATIVAN) 0.5 MG tablet Take 1 tablet (0.5 mg total) at bedtime as needed by mouth for sleep. (Patient not taking: Reported on 09/21/2017) 30 tablet 0  . ondansetron (ZOFRAN) 8 MG tablet Take 1 tablet (8 mg total) 2 (two) times daily as needed by mouth (Nausea or vomiting). 30 tablet  1  . pantoprazole (PROTONIX) 40 MG tablet Take 40 mg by mouth daily.    . prochlorperazine (COMPAZINE) 10 MG tablet Take 1 tablet (10 mg total) every 6 (six) hours as needed by mouth (Nausea or vomiting). 30 tablet 1   No current facility-administered medications for this visit.    Facility-Administered Medications Ordered in Other  Visits  Medication Dose Route Frequency Provider Last Rate Last Dose  . sodium chloride flush (NS) 0.9 % injection 10 mL  10 mL Intracatheter PRN Nicholas Lose, MD   10 mL at 10/22/17 1636    PHYSICAL EXAMINATION: ECOG PERFORMANCE STATUS: 1 - Symptomatic but completely ambulatory  Vitals:   10/22/17 1341  BP: 130/90  Pulse: 71  Resp: 16  Temp: 99.1 F (37.3 C)  SpO2: 100%   Filed Weights   10/22/17 1341  Weight: 157 lb 9.6 oz (71.5 kg)    GENERAL:alert, no distress and comfortable SKIN: skin color, texture, turgor are normal, no rashes or significant lesions EYES: normal, Conjunctiva are pink and non-injected, sclera clear OROPHARYNX:no exudate, no erythema and lips, buccal mucosa, and tongue normal  NECK: supple, thyroid normal size, non-tender, without nodularity LYMPH:  no palpable lymphadenopathy in the cervical, axillary or inguinal LUNGS: clear to auscultation and percussion with normal breathing effort HEART: regular rate & rhythm and no murmurs and no lower extremity edema ABDOMEN:abdomen soft, non-tender and normal bowel sounds MUSCULOSKELETAL:no cyanosis of digits and no clubbing  NEURO: alert & oriented x 3 with fluent speech, no focal motor/sensory deficits EXTREMITIES: No lower extremity edema   LABORATORY DATA:  I have reviewed the data as listed CMP Latest Ref Rng & Units 10/22/2017 10/15/2017 10/09/2017  Glucose 70 - 140 mg/dL 106 99 96  BUN 7 - 26 mg/dL _0 Creatinine 0.60 - 1.10 mg/dL 0.79 0.73 0.67  Sodium 136 - 145 mmol/L 142 141 141  Potassium 3.3 - 4.7 mmol/L 4.1 3.6 3.9  Chloride 98 - 109 mmol/L 109 106 110(H)  CO2 22 - 29 mmol/L _1 Calcium 8.4 - 10.4 mg/dL 9.5 9.0 8.8  Total Protein 6.4 - 8.3 g/dL 7.4 6.9 6.6  Total Bilirubin 0.2 - 1.2 mg/dL <0.2(L) 0.4 <0.2(L)  Alkaline Phos 40 - 150 U/L 91 84 79  AST 5 - 34 U/L 16 40(H) 16  ALT 0 - 55 U/L 25 58(H) 31    Lab Results  Component Value Date   WBC 6.6 10/22/2017   HGB 11.6  10/22/2017   HCT 35.2 10/22/2017   MCV 82.9 10/22/2017   PLT 275 10/22/2017   NEUTROABS 3.7 10/22/2017    ASSESSMENT & PLAN:  Malignant neoplasm of upper-outer quadrant of right breast in female, estrogen receptor negative ([facility]) 07/24/2017:Right lumpectomy: IDC grade 3, 1.8 cm, 0/2 lymph nodes negative,ER 0%, PR 0%, Ki-67 40%, HER-2 positive ratio 3.59, T1CN0 stage I a  Pathology counseling: I discussed the final pathology report of the patient provided a copy of this report. I discussed the margins as well as lymph node surgeries. We also discussed the final staging along with previously performed ER/PR and HER-2/neu testing.  Recommendation: 1. Adjuvant chemotherapy with Taxol Herceptin followed by Herceptin maintenance for 1 year 2. followed by adjuvant radiation therapy ------------------------------------------------------------------------------------------------------------------------------------ Current treatment:Cycle  7Taxol Herceptin Labs reviewed Echocardiogram 08/03/2017: EF 60-65%  Chemo toxicities: 1.Severe diarrhea: I instructed her to start taking Lomotil and Imodium. I gave her instructions on how to take them. 2.fatigue related to chemotherapy 3. Alopecia 4. Intermittent  lightheadedness 5. Chemotherapy-induced anemia  Return to clinicweekly for chemo and every 2 weeks for follow-up withme.    I spent 25 minutes talking to the patient of which more than half was spent in counseling and coordination of care.  No orders of the defined types were placed in this encounter.  The patient has a good understanding of the overall plan. she agrees with it. she will call with any problems that may develop before the next visit here.   Harriette Ohara, MD 10/22/17

## 2017-10-22 NOTE — Patient Instructions (Signed)
[facility] Discharge Instructions for Patients Receiving Chemotherapy  Today you received the following chemotherapy agents:  Herceptin and Taxol.  To help prevent nausea and vomiting after your treatment, we encourage you to take your nausea medication as directed.   If you develop nausea and vomiting that is not controlled by your nausea medication, call the clinic.   BELOW ARE SYMPTOMS THAT SHOULD BE REPORTED IMMEDIATELY:  *FEVER GREATER THAN 100.5 F  *CHILLS WITH OR WITHOUT FEVER  NAUSEA AND VOMITING THAT IS NOT CONTROLLED WITH YOUR NAUSEA MEDICATION  *UNUSUAL SHORTNESS OF BREATH  *UNUSUAL BRUISING OR BLEEDING  TENDERNESS IN MOUTH AND THROAT WITH OR WITHOUT PRESENCE OF ULCERS  *URINARY PROBLEMS  *BOWEL PROBLEMS  UNUSUAL RASH Items with * indicate a potential emergency and should be followed up as soon as possible.  Feel free to call the clinic should you have any questions or concerns. The clinic phone number is [phone].  Please show the CHEMO ALERT CARD at check-in to the Emergency Department and triage nurse.   

## 2017-10-23 ENCOUNTER — Telehealth: Payer: Self-pay

## 2017-10-23 NOTE — Telephone Encounter (Signed)
Per 1/28 no los 

## 2017-10-29 ENCOUNTER — Inpatient Hospital Stay: Payer: Medicaid Other

## 2017-10-29 ENCOUNTER — Inpatient Hospital Stay: Payer: Medicaid Other | Attending: Hematology and Oncology

## 2017-10-29 VITALS — BP 127/83 | HR 73 | Temp 98.2°F | Resp 16

## 2017-10-29 DIAGNOSIS — R42 Dizziness and giddiness: Secondary | ICD-10-CM | POA: Insufficient documentation

## 2017-10-29 DIAGNOSIS — Z5112 Encounter for antineoplastic immunotherapy: Secondary | ICD-10-CM | POA: Diagnosis not present

## 2017-10-29 DIAGNOSIS — Z171 Estrogen receptor negative status [ER-]: Secondary | ICD-10-CM | POA: Diagnosis not present

## 2017-10-29 DIAGNOSIS — T451X5S Adverse effect of antineoplastic and immunosuppressive drugs, sequela: Secondary | ICD-10-CM | POA: Diagnosis not present

## 2017-10-29 DIAGNOSIS — L659 Nonscarring hair loss, unspecified: Secondary | ICD-10-CM | POA: Insufficient documentation

## 2017-10-29 DIAGNOSIS — G620 Drug-induced polyneuropathy: Secondary | ICD-10-CM | POA: Insufficient documentation

## 2017-10-29 DIAGNOSIS — C50411 Malignant neoplasm of upper-outer quadrant of right female breast: Secondary | ICD-10-CM

## 2017-10-29 DIAGNOSIS — R197 Diarrhea, unspecified: Secondary | ICD-10-CM | POA: Insufficient documentation

## 2017-10-29 DIAGNOSIS — R5383 Other fatigue: Secondary | ICD-10-CM | POA: Diagnosis not present

## 2017-10-29 DIAGNOSIS — Z5111 Encounter for antineoplastic chemotherapy: Secondary | ICD-10-CM | POA: Insufficient documentation

## 2017-10-29 DIAGNOSIS — Z95828 Presence of other vascular implants and grafts: Secondary | ICD-10-CM

## 2017-10-29 DIAGNOSIS — D6481 Anemia due to antineoplastic chemotherapy: Secondary | ICD-10-CM | POA: Diagnosis not present

## 2017-10-29 DIAGNOSIS — Z79899 Other long term (current) drug therapy: Secondary | ICD-10-CM | POA: Insufficient documentation

## 2017-10-29 LAB — CBC WITH DIFFERENTIAL/PLATELET
BASOS PCT: 0 %
Basophils Absolute: 0 10*3/uL (ref 0.0–0.1)
EOS ABS: 0.1 10*3/uL (ref 0.0–0.5)
Eosinophils Relative: 1 %
HCT: 33.8 % — ABNORMAL LOW (ref 34.8–46.6)
Hemoglobin: 11 g/dL — ABNORMAL LOW (ref 11.6–15.9)
Lymphocytes Relative: 41 %
Lymphs Abs: 2.8 10*3/uL (ref 0.9–3.3)
MCH: 27.2 pg (ref 25.1–34.0)
MCHC: 32.5 g/dL (ref 31.5–36.0)
MCV: 83.5 fL (ref 79.5–101.0)
MONO ABS: 0.4 10*3/uL (ref 0.1–0.9)
MONOS PCT: 6 %
Neutro Abs: 3.4 10*3/uL (ref 1.5–6.5)
Neutrophils Relative %: 52 %
Platelets: 241 10*3/uL (ref 145–400)
RBC: 4.05 MIL/uL (ref 3.70–5.45)
RDW: 16.2 % — AB (ref 11.2–14.5)
WBC: 6.7 10*3/uL (ref 3.9–10.3)

## 2017-10-29 LAB — COMPREHENSIVE METABOLIC PANEL
ALBUMIN: 4 g/dL (ref 3.5–5.0)
ALT: 52 U/L (ref 0–55)
ANION GAP: 9 (ref 3–11)
AST: 31 U/L (ref 5–34)
Alkaline Phosphatase: 92 U/L (ref 40–150)
BUN: 11 mg/dL (ref 7–26)
CO2: 24 mmol/L (ref 22–29)
Calcium: 9.1 mg/dL (ref 8.4–10.4)
Chloride: 107 mmol/L (ref 98–109)
Creatinine, Ser: 0.66 mg/dL (ref 0.60–1.10)
GFR calc non Af Amer: >60 mL/min (ref >60)
GLUCOSE: 90 mg/dL (ref 70–140)
POTASSIUM: 3.8 mmol/L (ref 3.5–5.1)
SODIUM: 140 mmol/L (ref 136–145)
TOTAL PROTEIN: 7.1 g/dL (ref 6.4–8.3)
Total Bilirubin: 0.3 mg/dL (ref 0.2–1.2)

## 2017-10-29 MED ORDER — FAMOTIDINE IN NACL 20-0.9 MG/50ML-% IV SOLN
20.0000 mg | Freq: Once | INTRAVENOUS | Status: AC
  Administered 2017-10-29: 20 mg via INTRAVENOUS

## 2017-10-29 MED ORDER — SODIUM CHLORIDE 0.9 % IV SOLN
80.0000 mg/m2 | Freq: Once | INTRAVENOUS | Status: AC
  Administered 2017-10-29: 144 mg via INTRAVENOUS
  Filled 2017-10-29: qty 24

## 2017-10-29 MED ORDER — ACETAMINOPHEN 325 MG PO TABS
ORAL_TABLET | ORAL | Status: AC
  Filled 2017-10-29: qty 2

## 2017-10-29 MED ORDER — TRASTUZUMAB CHEMO 150 MG IV SOLR
150.0000 mg | Freq: Once | INTRAVENOUS | Status: AC
  Administered 2017-10-29: 150 mg via INTRAVENOUS
  Filled 2017-10-29: qty 7.14

## 2017-10-29 MED ORDER — FAMOTIDINE IN NACL 20-0.9 MG/50ML-% IV SOLN
INTRAVENOUS | Status: AC
  Filled 2017-10-29: qty 50

## 2017-10-29 MED ORDER — TRASTUZUMAB CHEMO 150 MG IV SOLR: 2.0000 mg/kg | Freq: Once | INTRAVENOUS | Status: DC

## 2017-10-29 MED ORDER — SODIUM CHLORIDE 0.9% FLUSH
10.0000 mL | INTRAVENOUS | Status: DC | PRN
  Administered 2017-10-29: 10 mL
  Filled 2017-10-29: qty 10

## 2017-10-29 MED ORDER — ACETAMINOPHEN 325 MG PO TABS
650.0000 mg | ORAL_TABLET | Freq: Once | ORAL | Status: AC
  Administered 2017-10-29: 650 mg via ORAL

## 2017-10-29 MED ORDER — SODIUM CHLORIDE 0.9% FLUSH
10.0000 mL | INTRAVENOUS | Status: DC | PRN
  Administered 2017-10-29: 10 mL via INTRAVENOUS
  Filled 2017-10-29: qty 10

## 2017-10-29 MED ORDER — HEPARIN SOD (PORK) LOCK FLUSH 100 UNIT/ML IV SOLN
500.0000 [IU] | Freq: Once | INTRAVENOUS | Status: AC | PRN
  Administered 2017-10-29: 500 [IU]
  Filled 2017-10-29: qty 5

## 2017-10-29 MED ORDER — DIPHENHYDRAMINE HCL 50 MG/ML IJ SOLN
50.0000 mg | Freq: Once | INTRAMUSCULAR | Status: AC
  Administered 2017-10-29: 50 mg via INTRAVENOUS

## 2017-10-29 MED ORDER — SODIUM CHLORIDE 0.9 % IV SOLN
Freq: Once | INTRAVENOUS | Status: AC
  Administered 2017-10-29: 12:00:00 via INTRAVENOUS

## 2017-10-29 MED ORDER — SODIUM CHLORIDE 0.9 % IV SOLN
20.0000 mg | Freq: Once | INTRAVENOUS | Status: AC
  Administered 2017-10-29: 20 mg via INTRAVENOUS
  Filled 2017-10-29: qty 2

## 2017-10-29 MED ORDER — DIPHENHYDRAMINE HCL 50 MG/ML IJ SOLN
INTRAMUSCULAR | Status: AC
  Filled 2017-10-29: qty 1

## 2017-10-29 NOTE — Patient Instructions (Signed)
[facility] Discharge Instructions for Patients Receiving Chemotherapy  Today you received the following chemotherapy agents:  Herceptin and Taxol.  To help prevent nausea and vomiting after your treatment, we encourage you to take your nausea medication as directed.   If you develop nausea and vomiting that is not controlled by your nausea medication, call the clinic.   BELOW ARE SYMPTOMS THAT SHOULD BE REPORTED IMMEDIATELY:  *FEVER GREATER THAN 100.5 F  *CHILLS WITH OR WITHOUT FEVER  NAUSEA AND VOMITING THAT IS NOT CONTROLLED WITH YOUR NAUSEA MEDICATION  *UNUSUAL SHORTNESS OF BREATH  *UNUSUAL BRUISING OR BLEEDING  TENDERNESS IN MOUTH AND THROAT WITH OR WITHOUT PRESENCE OF ULCERS  *URINARY PROBLEMS  *BOWEL PROBLEMS  UNUSUAL RASH Items with * indicate a potential emergency and should be followed up as soon as possible.  Feel free to call the clinic should you have any questions or concerns. The clinic phone number is [phone].  Please show the CHEMO ALERT CARD at check-in to the Emergency Department and triage nurse.   

## 2017-11-05 ENCOUNTER — Inpatient Hospital Stay: Payer: Medicaid Other

## 2017-11-05 ENCOUNTER — Inpatient Hospital Stay (HOSPITAL_BASED_OUTPATIENT_CLINIC_OR_DEPARTMENT_OTHER): Payer: Medicaid Other | Admitting: Hematology and Oncology

## 2017-11-05 DIAGNOSIS — Z171 Estrogen receptor negative status [ER-]: Principal | ICD-10-CM

## 2017-11-05 DIAGNOSIS — D6481 Anemia due to antineoplastic chemotherapy: Secondary | ICD-10-CM

## 2017-11-05 DIAGNOSIS — C50411 Malignant neoplasm of upper-outer quadrant of right female breast: Secondary | ICD-10-CM

## 2017-11-05 DIAGNOSIS — Z79899 Other long term (current) drug therapy: Secondary | ICD-10-CM | POA: Diagnosis not present

## 2017-11-05 DIAGNOSIS — Z95828 Presence of other vascular implants and grafts: Secondary | ICD-10-CM

## 2017-11-05 DIAGNOSIS — L659 Nonscarring hair loss, unspecified: Secondary | ICD-10-CM

## 2017-11-05 DIAGNOSIS — R5383 Other fatigue: Secondary | ICD-10-CM | POA: Diagnosis not present

## 2017-11-05 DIAGNOSIS — R42 Dizziness and giddiness: Secondary | ICD-10-CM

## 2017-11-05 DIAGNOSIS — T451X5S Adverse effect of antineoplastic and immunosuppressive drugs, sequela: Secondary | ICD-10-CM | POA: Diagnosis not present

## 2017-11-05 DIAGNOSIS — Z5111 Encounter for antineoplastic chemotherapy: Secondary | ICD-10-CM | POA: Diagnosis not present

## 2017-11-05 LAB — CBC WITH DIFFERENTIAL/PLATELET
Basophils Absolute: 0 10*3/uL (ref 0.0–0.1)
Basophils Relative: 1 %
Eosinophils Absolute: 0.1 10*3/uL (ref 0.0–0.5)
Eosinophils Relative: 1 %
HEMATOCRIT: 32.5 % — AB (ref 34.8–46.6)
Hemoglobin: 10.6 g/dL — ABNORMAL LOW (ref 11.6–15.9)
LYMPHS ABS: 2.1 10*3/uL (ref 0.9–3.3)
LYMPHS PCT: 34 %
MCH: 27 pg (ref 25.1–34.0)
MCHC: 32.5 g/dL (ref 31.5–36.0)
MCV: 83 fL (ref 79.5–101.0)
MONOS PCT: 6 %
Monocytes Absolute: 0.3 10*3/uL (ref 0.1–0.9)
NEUTROS ABS: 3.5 10*3/uL (ref 1.5–6.5)
NEUTROS PCT: 58 %
Platelets: 250 10*3/uL (ref 145–400)
RBC: 3.91 MIL/uL (ref 3.70–5.45)
RDW: 16.2 % — ABNORMAL HIGH (ref 11.2–14.5)
WBC: 6 10*3/uL (ref 3.9–10.3)

## 2017-11-05 LAB — COMPREHENSIVE METABOLIC PANEL
ALBUMIN: 3.9 g/dL (ref 3.5–5.0)
ALT: 23 U/L (ref 0–55)
ANION GAP: 7 (ref 3–11)
AST: 17 U/L (ref 5–34)
Alkaline Phosphatase: 87 U/L (ref 40–150)
BUN: 9 mg/dL (ref 7–26)
CHLORIDE: 107 mmol/L (ref 98–109)
CO2: 26 mmol/L (ref 22–29)
Calcium: 9 mg/dL (ref 8.4–10.4)
Creatinine, Ser: 0.68 mg/dL (ref 0.60–1.10)
GFR calc Af Amer: >60 mL/min (ref >60)
GFR calc non Af Amer: >60 mL/min (ref >60)
GLUCOSE: 101 mg/dL (ref 70–140)
POTASSIUM: 3.5 mmol/L (ref 3.5–5.1)
SODIUM: 140 mmol/L (ref 136–145)
Total Bilirubin: 0.2 mg/dL (ref 0.2–1.2)
Total Protein: 6.8 g/dL (ref 6.4–8.3)

## 2017-11-05 MED ORDER — SODIUM CHLORIDE 0.9% FLUSH
10.0000 mL | INTRAVENOUS | Status: DC | PRN
  Administered 2017-11-05: 10 mL via INTRAVENOUS
  Filled 2017-11-05: qty 10

## 2017-11-05 MED ORDER — HEPARIN SOD (PORK) LOCK FLUSH 100 UNIT/ML IV SOLN
500.0000 [IU] | Freq: Once | INTRAVENOUS | Status: AC | PRN
  Administered 2017-11-05: 500 [IU]
  Filled 2017-11-05: qty 5

## 2017-11-05 MED ORDER — SODIUM CHLORIDE 0.9 % IV SOLN
Freq: Once | INTRAVENOUS | Status: AC
  Administered 2017-11-05: 12:00:00 via INTRAVENOUS

## 2017-11-05 MED ORDER — SODIUM CHLORIDE 0.9 % IV SOLN
80.0000 mg/m2 | Freq: Once | INTRAVENOUS | Status: AC
  Administered 2017-11-05: 144 mg via INTRAVENOUS
  Filled 2017-11-05: qty 24

## 2017-11-05 MED ORDER — SODIUM CHLORIDE 0.9 % IV SOLN
20.0000 mg | Freq: Once | INTRAVENOUS | Status: AC
  Administered 2017-11-05: 20 mg via INTRAVENOUS
  Filled 2017-11-05: qty 2

## 2017-11-05 MED ORDER — TRASTUZUMAB CHEMO 150 MG IV SOLR
150.0000 mg | Freq: Once | INTRAVENOUS | Status: AC
  Administered 2017-11-05: 150 mg via INTRAVENOUS
  Filled 2017-11-05: qty 7.14

## 2017-11-05 MED ORDER — ACETAMINOPHEN 325 MG PO TABS
650.0000 mg | ORAL_TABLET | Freq: Once | ORAL | Status: AC
  Administered 2017-11-05: 650 mg via ORAL

## 2017-11-05 MED ORDER — SODIUM CHLORIDE 0.9% FLUSH
10.0000 mL | INTRAVENOUS | Status: DC | PRN
  Administered 2017-11-05: 10 mL
  Filled 2017-11-05: qty 10

## 2017-11-05 MED ORDER — DIPHENHYDRAMINE HCL 50 MG/ML IJ SOLN
50.0000 mg | Freq: Once | INTRAMUSCULAR | Status: AC
  Administered 2017-11-05: 50 mg via INTRAVENOUS

## 2017-11-05 MED ORDER — ACETAMINOPHEN 325 MG PO TABS
ORAL_TABLET | ORAL | Status: AC
  Filled 2017-11-05: qty 2

## 2017-11-05 MED ORDER — FAMOTIDINE IN NACL 20-0.9 MG/50ML-% IV SOLN: 20.0000 mg | Freq: Once | INTRAVENOUS | Status: DC

## 2017-11-05 MED ORDER — DIPHENHYDRAMINE HCL 50 MG/ML IJ SOLN
INTRAMUSCULAR | Status: AC
  Filled 2017-11-05: qty 1

## 2017-11-05 NOTE — Patient Instructions (Signed)
[facility] Discharge Instructions for Patients Receiving Chemotherapy  Today you received the following chemotherapy agents:  Herceptin and Taxol.  To help prevent nausea and vomiting after your treatment, we encourage you to take your nausea medication as directed.   If you develop nausea and vomiting that is not controlled by your nausea medication, call the clinic.   BELOW ARE SYMPTOMS THAT SHOULD BE REPORTED IMMEDIATELY:  *FEVER GREATER THAN 100.5 F  *CHILLS WITH OR WITHOUT FEVER  NAUSEA AND VOMITING THAT IS NOT CONTROLLED WITH YOUR NAUSEA MEDICATION  *UNUSUAL SHORTNESS OF BREATH  *UNUSUAL BRUISING OR BLEEDING  TENDERNESS IN MOUTH AND THROAT WITH OR WITHOUT PRESENCE OF ULCERS  *URINARY PROBLEMS  *BOWEL PROBLEMS  UNUSUAL RASH Items with * indicate a potential emergency and should be followed up as soon as possible.  Feel free to call the clinic should you have any questions or concerns. The clinic phone number is [phone].  Please show the CHEMO ALERT CARD at check-in to the Emergency Department and triage nurse.   

## 2017-11-05 NOTE — Progress Notes (Signed)
Patient Care Team: [name], MD as PCP - General (Family Medicine) Erroll Luna, MD as Consulting Physician (General Surgery)  DIAGNOSIS:  Encounter Diagnosis  Name Primary?  . Malignant neoplasm of upper-outer quadrant of right breast in female, estrogen receptor negative ([place])     SUMMARY OF ONCOLOGIC HISTORY:   Malignant neoplasm of upper-outer quadrant of right breast in female, estrogen receptor negative ([place])   06/26/2017 Initial Diagnosis    Right breast biopsy 8:30 position 8 cm from nipple: IDC with DCIS, second biopsy 6 cm from nipple fibrocystic changes: Grade 3, ER 0%, PR 0%, Ki-67 40%, HER-2 positive ratio 3.59; mammogram and ultrasound revealed 1.9 cm mass at 8:30 position, adjacent 0.7 cm cystic mass, T1c N0 stage IA AJCC 8       07/24/2017 Surgery    Right lumpectomy: IDC grade 3, 1.8 cm, 0/2 lymph nodes negative,ER 0%, PR 0%, Ki-67 40%, HER-2 positive ratio 3.59, T1CN0 stage I a       09/10/2017 -  Chemotherapy    Taxol Herceptin weekly x12 followed by Herceptin maintenance for 1 year       CHIEF COMPLIANT: Cycle 9 Taxol  INTERVAL HISTORY: Katherine DERDEN is a 47 year old with above-mentioned right breast cancer underwent lumpectomy and is now on adjuvant chemotherapy and today's cycle 9 of Taxol.  She does not report any significant neuropathy.  She does have other problems including decreased taste and appetite.  However she has been eating well.  Maintaining her weight.  REVIEW OF SYSTEMS:   Constitutional: Denies fevers, chills or abnormal weight loss Eyes: Denies blurriness of vision Ears, nose, mouth, throat, and face: Denies mucositis or sore throat Respiratory: Denies cough, dyspnea or wheezes Cardiovascular: Denies palpitation, chest discomfort Gastrointestinal:  Denies nausea, heartburn or change in bowel habits Skin: Denies abnormal skin rashes Lymphatics: Denies new lymphadenopathy or easy bruising Neurological:Denies numbness, tingling  or new weaknesses Behavioral/Psych: Mood is stable, no new changes  Extremities: No lower extremity edema  All other systems were reviewed with the patient and are negative.  I have reviewed the past medical history, past surgical history, social history and family history with the patient and they are unchanged from previous note.  ALLERGIES:  is allergic to morphine; shellfish allergy; penicillins; codeine; hydrocodone; and tomato.  MEDICATIONS:  Current Outpatient Medications  Medication Sig Dispense Refill  . albuterol (PROVENTIL HFA;VENTOLIN HFA) 108 (90 Base) MCG/ACT inhaler Inhale 2 puffs into the lungs every 6 (six) hours as needed for wheezing or shortness of breath.    . diphenoxylate-atropine (LOMOTIL) 2.5-0.025 MG tablet Take 1 tablet by mouth 4 (four) times daily as needed for diarrhea or loose stools. 30 tablet 0  . ibuprofen (ADVIL,MOTRIN) 800 MG tablet Take 1 tablet (800 mg total) by mouth every 8 (eight) hours as needed. (Patient taking differently: Take 800 mg by mouth every 8 (eight) hours as needed for moderate pain. ) 30 tablet 0  . ipratropium (ATROVENT) 0.03 % nasal spray SPRAY TWO SQUIRTS INTO EACH NOSTRIL TWICE A DAY  1  . lidocaine-prilocaine (EMLA) cream Apply to affected area once 30 g 3  . LORazepam (ATIVAN) 0.5 MG tablet Take 1 tablet (0.5 mg total) at bedtime as needed by mouth for sleep. (Patient not taking: Reported on 09/21/2017) 30 tablet 0  . ondansetron (ZOFRAN) 8 MG tablet Take 1 tablet (8 mg total) 2 (two) times daily as needed by mouth (Nausea or vomiting). 30 tablet 1  . pantoprazole (PROTONIX) 40 MG tablet Take  40 mg by mouth daily.    . prochlorperazine (COMPAZINE) 10 MG tablet Take 1 tablet (10 mg total) every 6 (six) hours as needed by mouth (Nausea or vomiting). 30 tablet 1   No current facility-administered medications for this visit.    Facility-Administered Medications Ordered in Other Visits  Medication Dose Route Frequency Provider Last  Rate Last Dose  . heparin lock flush 100 unit/mL  500 Units Intracatheter Once PRN Nicholas Lose, MD      . PACLitaxel (TAXOL) 144 mg in sodium chloride 0.9 % 250 mL chemo infusion (</= 68m/m2)  80 mg/m2 (Treatment Plan Recorded) Intravenous Once GNicholas Lose MD      . sodium chloride flush (NS) 0.9 % injection 10 mL  10 mL Intracatheter PRN GNicholas Lose MD      . trastuzumab (HERCEPTIN) 150 mg in sodium chloride 0.9 % 250 mL chemo infusion  150 mg Intravenous Once GNicholas Lose MD        PHYSICAL EXAMINATION: ECOG PERFORMANCE STATUS: 1 - Symptomatic but completely ambulatory  Vitals:   11/05/17 0957  BP: (!) 139/92  Pulse: 70  Resp: 19  Temp: 98.4 F (36.9 C)  SpO2: 100%   Filed Weights   11/05/17 0957  Weight: 159 lb 1.6 oz (72.2 kg)    GENERAL:alert, no distress and comfortable SKIN: skin color, texture, turgor are normal, no rashes or significant lesions EYES: normal, Conjunctiva are pink and non-injected, sclera clear OROPHARYNX:no exudate, no erythema and lips, buccal mucosa, and tongue normal  NECK: supple, thyroid normal size, non-tender, without nodularity LYMPH:  no palpable lymphadenopathy in the cervical, axillary or inguinal LUNGS: clear to auscultation and percussion with normal breathing effort HEART: regular rate & rhythm and no murmurs and no lower extremity edema ABDOMEN:abdomen soft, non-tender and normal bowel sounds MUSCULOSKELETAL:no cyanosis of digits and no clubbing  NEURO: alert & oriented x 3 with fluent speech, no focal motor/sensory deficits EXTREMITIES: No lower extremity edema  LABORATORY DATA:  I have reviewed the data as listed CMP Latest Ref Rng & Units 11/05/2017 10/29/2017 10/22/2017  Glucose 70 - 140 mg/dL 101 90 106  BUN 7 - 26 mg/dL _0 Creatinine 0.60 - 1.10 mg/dL 0.68 0.66 0.79  Sodium 136 - 145 mmol/L 140 140 142  Potassium 3.5 - 5.1 mmol/L 3.5 3.8 4.1  Chloride 98 - 109 mmol/L 107 107 109  CO2 22 - 29 mmol/L _1 Calcium 8.4 - 10.4 mg/dL 9.0 9.1 9.5  Total Protein 6.4 - 8.3 g/dL 6.8 7.1 7.4  Total Bilirubin 0.2 - 1.2 mg/dL 0.2 0.3 <0.2(L)  Alkaline Phos 40 - 150 U/L 87 92 91  AST 5 - 34 U/L _2 ALT 0 - 55 U/L 23 52 25    Lab Results  Component Value Date   WBC 6.0 11/05/2017   HGB 10.6 (L) 11/05/2017   HCT 32.5 (L) 11/05/2017   MCV 83.0 11/05/2017   PLT 250 11/05/2017   NEUTROABS 3.5 11/05/2017    ASSESSMENT & PLAN:  Malignant neoplasm of upper-outer quadrant of right breast in [place], [place] 07/[place]2017:Right lumpectomy: IDC grade 3, 1.8 cm, 0/2 lymph nodes negative,ER 0%, PR 0%, Ki-67 40%, HER-2 positive ratio 3.59, T1CN0 stage I a  Pathology counseling: I discussed the final pathology report of the patient provided a copy of this report. I discussed the margins as well as lymph node surgeries. We also discussed the final staging along with  previously performed ER/PR and HER-2/neu testing.  Recommendation: 1. Adjuvant chemotherapy with Taxol Herceptin followed by Herceptin maintenance for 1 year 2. followed by adjuvant radiation therapy ------------------------------------------------------------------------------------------------------------------------------------ Current treatment:Cycle 9Taxol Herceptin Labs reviewed Echocardiogram 08/03/2017: EF 60-65%  Chemo toxicities: 1.Severe diarrhea: I instructed her to start taking Lomotil and Imodium. I gave her instructions on how to take them. 2.fatigue related to chemotherapy 3. Alopecia 4. Intermittent lightheadedness 5. Chemotherapy-induced anemia  Return to clinicweekly for chemo and every 2 weeks for follow-up withme.      I spent 25 minutes talking to the patient of which more than half was spent in counseling and coordination of care.  No orders of the defined types were placed in this encounter.  The patient has a good understanding of the overall plan. she agrees with it.  she will call with any problems that may develop before the next visit here.   Harriette Ohara, MD 11/05/17

## 2017-11-05 NOTE — Assessment & Plan Note (Addendum)
07/24/2017:Right lumpectomy: IDC grade 3, 1.8 cm, 0/2 lymph nodes negative,ER 0%, PR 0%, Ki-67 40%, HER-2 positive ratio 3.59, T1CN0 stage I a  Pathology counseling: I discussed the final pathology report of the patient provided a copy of this report. I discussed the margins as well as lymph node surgeries. We also discussed the final staging along with previously performed ER/PR and HER-2/neu testing.  Recommendation: 1. Adjuvant chemotherapy with Taxol Herceptin followed by Herceptin maintenance for 1 year 2. followed by adjuvant radiation therapy ------------------------------------------------------------------------------------------------------------------------------------ Current treatment:Cycle 9Taxol Herceptin Labs reviewed Echocardiogram 08/03/2017: EF 60-65%  Chemo toxicities: 1.Severe diarrhea: I instructed her to start taking Lomotil and Imodium. I gave her instructions on how to take them. 2.fatigue related to chemotherapy 3. Alopecia 4. Intermittent lightheadedness 5. Chemotherapy-induced anemia  Return to clinicweekly for chemo and every 2 weeks for follow-up withme.

## 2017-11-06 ENCOUNTER — Encounter (HOSPITAL_COMMUNITY): Payer: Self-pay | Admitting: Cardiology

## 2017-11-06 ENCOUNTER — Ambulatory Visit (HOSPITAL_COMMUNITY)
Admission: RE | Admit: 2017-11-06 | Discharge: 2017-11-06 | Disposition: A | Discharge status: Home / Self Care | Payer: Medicaid Other | Source: Ambulatory Visit | Attending: Family Medicine | Admitting: Family Medicine

## 2017-11-06 ENCOUNTER — Ambulatory Visit (HOSPITAL_BASED_OUTPATIENT_CLINIC_OR_DEPARTMENT_OTHER)
Admission: RE | Admit: 2017-11-06 | Discharge: 2017-11-06 | Disposition: A | Discharge status: Home / Self Care | Payer: Medicaid Other | Source: Ambulatory Visit | Attending: Cardiology | Admitting: Cardiology

## 2017-11-06 VITALS — BP 140/90 | HR 67 | Wt 158.1 lb

## 2017-11-06 DIAGNOSIS — C50411 Malignant neoplasm of upper-outer quadrant of right female breast: Secondary | ICD-10-CM

## 2017-11-06 DIAGNOSIS — I471 Supraventricular tachycardia: Secondary | ICD-10-CM | POA: Insufficient documentation

## 2017-11-06 DIAGNOSIS — Z79899 Other long term (current) drug therapy: Secondary | ICD-10-CM | POA: Diagnosis not present

## 2017-11-06 DIAGNOSIS — Z803 Family history of malignant neoplasm of breast: Secondary | ICD-10-CM | POA: Diagnosis not present

## 2017-11-06 DIAGNOSIS — Z8249 Family history of ischemic heart disease and other diseases of the circulatory system: Secondary | ICD-10-CM | POA: Diagnosis not present

## 2017-11-06 DIAGNOSIS — Z171 Estrogen receptor negative status [ER-]: Secondary | ICD-10-CM

## 2017-11-06 DIAGNOSIS — Z87891 Personal history of nicotine dependence: Secondary | ICD-10-CM | POA: Diagnosis not present

## 2017-11-06 DIAGNOSIS — Z791 Long term (current) use of non-steroidal anti-inflammatories (NSAID): Secondary | ICD-10-CM | POA: Insufficient documentation

## 2017-11-06 NOTE — Progress Notes (Signed)
  Echocardiogram 2D Echocardiogram has been performed.  [name] 11/06/2017, 8:55 AM

## 2017-11-06 NOTE — Progress Notes (Signed)
Oncology: Dr. Lindi Adie  47 yo was referred by Dr. Lindi Adie for cardio-oncology evaluation. She has breast cancer that was diagnosed on right in 10/18.  ER-/PR-/HER2+.  Right lumpectomy 10/18. She will have Taxol/Herceptin followed by Herceptin alone x 1 year.   She has a prior history of SVT but this seems to have been quiescent for years.  No palpitations or lightheadedness.    She returns for cardio-oncology followup.  No dyspnea or chest pain.  Feels good overall.    PMH: 1. H/o SVT 2. Breast cancer: Diagnosed on right in 10/18.  ER-/PR-/HER2+.  Right lumpectomy 10/18. She will have Taxol/Herceptin followed by Herceptin alone x 1 year.  - Echo (11/18): EF 57-84%, normal diastolic function, GLS -69.6%.  - Echo (2/19): EF 29-52%, normal diastolic function, GLS -84.1%  Family History  Problem Relation Age of Onset  . Migraines Daughter   . Hypertension Mother   . Heart disease Mother   . Hypertension Father   . Heart disease Father   . Breast cancer Sister 8       deceased at age 35  . Migraines Son   . Hypertension Unknown   . Colon cancer Neg Hx   . Colon polyps Neg Hx   . Diabetes Neg Hx   . Kidney disease Neg Hx   . Liver disease Neg Hx   . Heart attack Neg Hx   . Stroke Neg Hx    Social History   Socioeconomic History  . Marital status: Married    Spouse name: None  . Number of children: 2  . Years of education: 68  . Highest education level: None  Social Needs  . Financial resource strain: None  . Food insecurity - worry: None  . Food insecurity - inability: None  . Transportation needs - medical: None  . Transportation needs - non-medical: None  Occupational History  . Occupation: unemployed  Tobacco Use  . Smoking status: Former Smoker    Packs/day: 0.50    Types: Cigarettes    Last attempt to quit: 07/16/2017    Years since quitting: 0.3  . Smokeless tobacco: Never Used  . Tobacco comment: smokes 1cigarette per day and more if she is stressed  Substance  and Sexual Activity  . Alcohol use: No    Alcohol/week: 0.0 oz  . Drug use: Yes    Types: Marijuana    Comment: occasional, for pain. states she hasnt used for a mont; 08/02/17 sometimes  . Sexual activity: Yes    Birth control/protection: Surgical  Other Topics Concern  . None  Social History Narrative   On disability for heart condition.  Pt. Was non-specific.       Patient does not drink caffeine.   Patient is ambidextrous, but mostly right handed.    12th grade, some college courses   ROS: All systems reviewed and negative except as per HPI  Current Outpatient Medications  Medication Sig Dispense Refill  . albuterol (PROVENTIL HFA;VENTOLIN HFA) 108 (90 Base) MCG/ACT inhaler Inhale 2 puffs into the lungs every 6 (six) hours as needed for wheezing or shortness of breath.    . diphenoxylate-atropine (LOMOTIL) 2.5-0.025 MG tablet Take 1 tablet by mouth 4 (four) times daily as needed for diarrhea or loose stools. 30 tablet 0  . ibuprofen (ADVIL,MOTRIN) 800 MG tablet Take 1 tablet (800 mg total) by mouth every 8 (eight) hours as needed. (Patient taking differently: Take 800 mg by mouth every 8 (eight) hours as needed for moderate  pain. ) 30 tablet 0  . ipratropium (ATROVENT) 0.03 % nasal spray SPRAY TWO SQUIRTS INTO EACH NOSTRIL TWICE A DAY  1  . lidocaine-prilocaine (EMLA) cream Apply to affected area once 30 g 3  . LORazepam (ATIVAN) 0.5 MG tablet Take 1 tablet (0.5 mg total) at bedtime as needed by mouth for sleep. 30 tablet 0  . ondansetron (ZOFRAN) 8 MG tablet Take 1 tablet (8 mg total) 2 (two) times daily as needed by mouth (Nausea or vomiting). 30 tablet 1  . pantoprazole (PROTONIX) 40 MG tablet Take 40 mg by mouth daily.    . prochlorperazine (COMPAZINE) 10 MG tablet Take 1 tablet (10 mg total) every 6 (six) hours as needed by mouth (Nausea or vomiting). 30 tablet 1   No current facility-administered medications for this encounter.    BP 140/90   Pulse 67   Wt 158 lb 1.9 oz  (71.7 kg)   LMP 11/13/2016   SpO2 98%   BMI 26.31 kg/m  General: NAD Neck: No JVD, no thyromegaly or thyroid nodule.  Lungs: Clear to auscultation bilaterally with normal respiratory effort. CV: Nondisplaced PMI.  Heart regular S1/S2, no S3/S4, no murmur.  No peripheral edema.  No carotid bruit.  Normal pedal pulses.  Abdomen: Soft, nontender, no hepatosplenomegaly, no distention.  Skin: Intact without lesions or rashes.  Neurologic: Alert and oriented x 3.  Psych: Normal affect. Extremities: No clubbing or cyanosis.  HEENT: Normal.   Assessment/Plan: 1. Breast cancer: Patient will be getting Herceptin-based treatment for a year.  I reviewed today's echo: normal LV EF and strain pattern.  She will continue Herceptin and return for echo in 3 months.  2. SVT: Quiescent.  No meds.   Loralie Champagne 11/06/2017

## 2017-11-06 NOTE — Patient Instructions (Signed)
Your physician recommends that you schedule a follow-up appointment in: PRN follow-up with Dr. Aundra Dubin

## 2017-11-12 ENCOUNTER — Ambulatory Visit: Payer: Self-pay

## 2017-11-12 ENCOUNTER — Other Ambulatory Visit: Payer: Self-pay

## 2017-11-12 ENCOUNTER — Inpatient Hospital Stay: Payer: Medicaid Other

## 2017-11-12 VITALS — BP 125/98 | HR 84 | Temp 99.3°F | Resp 19

## 2017-11-12 DIAGNOSIS — C50411 Malignant neoplasm of upper-outer quadrant of right female breast: Secondary | ICD-10-CM

## 2017-11-12 DIAGNOSIS — Z171 Estrogen receptor negative status [ER-]: Principal | ICD-10-CM

## 2017-11-12 DIAGNOSIS — Z5111 Encounter for antineoplastic chemotherapy: Secondary | ICD-10-CM | POA: Diagnosis not present

## 2017-11-12 LAB — CBC WITH DIFFERENTIAL/PLATELET
BASOS ABS: 0 10*3/uL (ref 0.0–0.1)
Basophils Relative: 1 %
EOS PCT: 2 %
Eosinophils Absolute: 0.1 10*3/uL (ref 0.0–0.5)
HEMATOCRIT: 33.3 % — AB (ref 34.8–46.6)
HEMOGLOBIN: 11 g/dL — AB (ref 11.6–15.9)
LYMPHS ABS: 1.3 10*3/uL (ref 0.9–3.3)
LYMPHS PCT: 31 %
MCH: 27.4 pg (ref 25.1–34.0)
MCHC: 33 g/dL (ref 31.5–36.0)
MCV: 83.2 fL (ref 79.5–101.0)
Monocytes Absolute: 0.3 10*3/uL (ref 0.1–0.9)
Monocytes Relative: 8 %
NEUTROS ABS: 2.4 10*3/uL (ref 1.5–6.5)
Neutrophils Relative %: 58 %
PLATELETS: 240 10*3/uL (ref 145–400)
RBC: 4 MIL/uL (ref 3.70–5.45)
RDW: 17.1 % — ABNORMAL HIGH (ref 11.2–14.5)
WBC: 4.1 10*3/uL (ref 3.9–10.3)

## 2017-11-12 LAB — COMPREHENSIVE METABOLIC PANEL
ALT: 55 U/L (ref 0–55)
AST: 30 U/L (ref 5–34)
Albumin: 3.7 g/dL (ref 3.5–5.0)
Alkaline Phosphatase: 83 U/L (ref 40–150)
Anion gap: 9 (ref 3–11)
BUN: 8 mg/dL (ref 7–26)
CHLORIDE: 105 mmol/L (ref 98–109)
CO2: 27 mmol/L (ref 22–29)
CREATININE: 0.66 mg/dL (ref 0.60–1.10)
Calcium: 9.4 mg/dL (ref 8.4–10.4)
GFR calc non Af Amer: >60 mL/min (ref >60)
Glucose, Bld: 109 mg/dL (ref 70–140)
POTASSIUM: 3.5 mmol/L (ref 3.5–5.1)
Sodium: 141 mmol/L (ref 136–145)
Total Bilirubin: <0.2 mg/dL — ABNORMAL LOW (ref 0.2–1.2)
Total Protein: 6.7 g/dL (ref 6.4–8.3)

## 2017-11-12 MED ORDER — SODIUM CHLORIDE 0.9% FLUSH
10.0000 mL | INTRAVENOUS | Status: DC | PRN
  Administered 2017-11-12: 10 mL
  Filled 2017-11-12: qty 10

## 2017-11-12 MED ORDER — SODIUM CHLORIDE 0.9 % IV SOLN
Freq: Once | INTRAVENOUS | Status: AC
  Administered 2017-11-12: 09:00:00 via INTRAVENOUS

## 2017-11-12 MED ORDER — DIPHENHYDRAMINE HCL 50 MG/ML IJ SOLN
50.0000 mg | Freq: Once | INTRAMUSCULAR | Status: AC
  Administered 2017-11-12: 50 mg via INTRAVENOUS

## 2017-11-12 MED ORDER — SODIUM CHLORIDE 0.9 % IV SOLN
20.0000 mg | Freq: Once | INTRAVENOUS | Status: AC
  Administered 2017-11-12: 20 mg via INTRAVENOUS
  Filled 2017-11-12: qty 2

## 2017-11-12 MED ORDER — DIPHENHYDRAMINE HCL 50 MG/ML IJ SOLN
INTRAMUSCULAR | Status: AC
  Filled 2017-11-12: qty 1

## 2017-11-12 MED ORDER — TRASTUZUMAB CHEMO 150 MG IV SOLR
150.0000 mg | Freq: Once | INTRAVENOUS | Status: AC
  Administered 2017-11-12: 150 mg via INTRAVENOUS
  Filled 2017-11-12: qty 7.14

## 2017-11-12 MED ORDER — HEPARIN SOD (PORK) LOCK FLUSH 100 UNIT/ML IV SOLN
500.0000 [IU] | Freq: Once | INTRAVENOUS | Status: AC | PRN
  Administered 2017-11-12: 500 [IU]
  Filled 2017-11-12: qty 5

## 2017-11-12 MED ORDER — SODIUM CHLORIDE 0.9 % IV SOLN
80.0000 mg/m2 | Freq: Once | INTRAVENOUS | Status: AC
  Administered 2017-11-12: 144 mg via INTRAVENOUS
  Filled 2017-11-12: qty 24

## 2017-11-12 MED ORDER — ACETAMINOPHEN 325 MG PO TABS
ORAL_TABLET | ORAL | Status: AC
Start: 2017-11-12 — End: 2017-11-12
  Filled 2017-11-12: qty 2

## 2017-11-12 MED ORDER — ACETAMINOPHEN 325 MG PO TABS
650.0000 mg | ORAL_TABLET | Freq: Once | ORAL | Status: AC
  Administered 2017-11-12: 650 mg via ORAL

## 2017-11-12 MED ORDER — FAMOTIDINE IN NACL 20-0.9 MG/50ML-% IV SOLN
20.0000 mg | Freq: Once | INTRAVENOUS | Status: DC
  Filled 2017-11-12: qty 50

## 2017-11-12 NOTE — Patient Instructions (Signed)
[facility] Discharge Instructions for Patients Receiving Chemotherapy  Today you received the following chemotherapy agents:  Herceptin and Taxol.  To help prevent nausea and vomiting after your treatment, we encourage you to take your nausea medication as directed.   If you develop nausea and vomiting that is not controlled by your nausea medication, call the clinic.   BELOW ARE SYMPTOMS THAT SHOULD BE REPORTED IMMEDIATELY:  *FEVER GREATER THAN 100.5 F  *CHILLS WITH OR WITHOUT FEVER  NAUSEA AND VOMITING THAT IS NOT CONTROLLED WITH YOUR NAUSEA MEDICATION  *UNUSUAL SHORTNESS OF BREATH  *UNUSUAL BRUISING OR BLEEDING  TENDERNESS IN MOUTH AND THROAT WITH OR WITHOUT PRESENCE OF ULCERS  *URINARY PROBLEMS  *BOWEL PROBLEMS  UNUSUAL RASH Items with * indicate a potential emergency and should be followed up as soon as possible.  Feel free to call the clinic should you have any questions or concerns. The clinic phone number is [phone].  Please show the CHEMO ALERT CARD at check-in to the Emergency Department and triage nurse.   

## 2017-11-13 ENCOUNTER — Other Ambulatory Visit: Payer: Self-pay | Admitting: Hematology and Oncology

## 2017-11-13 MED FILL — ONDANSETRON HCL 8 MG TABLET: 8 | 15 days supply | Qty: 30 | Fill #1

## 2017-11-13 MED FILL — LIDOCAINE-PRILOCAINE CREAM: 2.5-2.5 | 10 days supply | Qty: 30 | Fill #1

## 2017-11-13 MED FILL — PROCHLORPERAZINE 10 MG TAB: 10 | 8 days supply | Qty: 30 | Fill #1

## 2017-11-14 MED FILL — DIPHENOXYLATE-ATROP 2.5-0.0: 2.5-0.025 | 8 days supply | Qty: 30 | Fill #0

## 2017-11-16 ENCOUNTER — Telehealth: Payer: Self-pay | Admitting: Hematology and Oncology

## 2017-11-16 NOTE — Telephone Encounter (Signed)
Mailed patient calendar of upcoming March through May appointments.

## 2017-11-17 ENCOUNTER — Emergency Department (HOSPITAL_COMMUNITY)
Admission: EM | Admit: 2017-11-17 | Discharge: 2017-11-17 | Disposition: A | Discharge status: Home / Self Care | Payer: Medicaid Other | Attending: Emergency Medicine | Admitting: Emergency Medicine

## 2017-11-17 ENCOUNTER — Encounter (HOSPITAL_COMMUNITY): Payer: Self-pay | Admitting: Emergency Medicine

## 2017-11-17 ENCOUNTER — Emergency Department (HOSPITAL_COMMUNITY): Payer: Medicaid Other

## 2017-11-17 ENCOUNTER — Other Ambulatory Visit: Payer: Self-pay

## 2017-11-17 DIAGNOSIS — Z87891 Personal history of nicotine dependence: Secondary | ICD-10-CM | POA: Insufficient documentation

## 2017-11-17 DIAGNOSIS — Z79899 Other long term (current) drug therapy: Secondary | ICD-10-CM | POA: Diagnosis not present

## 2017-11-17 DIAGNOSIS — R1032 Left lower quadrant pain: Secondary | ICD-10-CM | POA: Insufficient documentation

## 2017-11-17 DIAGNOSIS — R300 Dysuria: Secondary | ICD-10-CM | POA: Insufficient documentation

## 2017-11-17 DIAGNOSIS — R509 Fever, unspecified: Secondary | ICD-10-CM | POA: Diagnosis not present

## 2017-11-17 DIAGNOSIS — J45909 Unspecified asthma, uncomplicated: Secondary | ICD-10-CM | POA: Diagnosis not present

## 2017-11-17 DIAGNOSIS — R350 Frequency of micturition: Secondary | ICD-10-CM | POA: Diagnosis not present

## 2017-11-17 DIAGNOSIS — R1031 Right lower quadrant pain: Secondary | ICD-10-CM | POA: Diagnosis not present

## 2017-11-17 DIAGNOSIS — R109 Unspecified abdominal pain: Secondary | ICD-10-CM

## 2017-11-17 DIAGNOSIS — R3915 Urgency of urination: Secondary | ICD-10-CM | POA: Insufficient documentation

## 2017-11-17 DIAGNOSIS — Z853 Personal history of malignant neoplasm of breast: Secondary | ICD-10-CM | POA: Insufficient documentation

## 2017-11-17 LAB — URINALYSIS, ROUTINE W REFLEX MICROSCOPIC
Bilirubin Urine: NEGATIVE
GLUCOSE, UA: NEGATIVE mg/dL
Hgb urine dipstick: NEGATIVE
KETONES UR: NEGATIVE mg/dL
LEUKOCYTES UA: NEGATIVE
NITRITE: NEGATIVE
PROTEIN: NEGATIVE mg/dL
Specific Gravity, Urine: 1.019 (ref 1.005–1.030)
pH: 8 (ref 5.0–8.0)

## 2017-11-17 LAB — COMPREHENSIVE METABOLIC PANEL
ALBUMIN: 3.8 g/dL (ref 3.5–5.0)
ALK PHOS: 77 U/L (ref 38–126)
ALT: 25 U/L (ref 14–54)
AST: 19 U/L (ref 15–41)
Anion gap: 12 (ref 5–15)
BILIRUBIN TOTAL: 0.5 mg/dL (ref 0.3–1.2)
BUN: 9 mg/dL (ref 6–20)
CO2: 22 mmol/L (ref 22–32)
CREATININE: 0.6 mg/dL (ref 0.44–1.00)
Calcium: 9 mg/dL (ref 8.9–10.3)
Chloride: 100 mmol/L — ABNORMAL LOW (ref 101–111)
GFR calc Af Amer: >60 mL/min (ref >60)
Glucose, Bld: 134 mg/dL — ABNORMAL HIGH (ref 65–99)
POTASSIUM: 3.9 mmol/L (ref 3.5–5.1)
Sodium: 134 mmol/L — ABNORMAL LOW (ref 135–145)
TOTAL PROTEIN: 6.8 g/dL (ref 6.5–8.1)

## 2017-11-17 LAB — CBC WITH DIFFERENTIAL/PLATELET
BASOS ABS: 0 10*3/uL (ref 0.0–0.1)
BASOS PCT: 0 %
Eosinophils Absolute: 0.1 10*3/uL (ref 0.0–0.7)
Eosinophils Relative: 1 %
HEMATOCRIT: 35.1 % — AB (ref 36.0–46.0)
Hemoglobin: 11.7 g/dL — ABNORMAL LOW (ref 12.0–15.0)
Lymphocytes Relative: 32 %
Lymphs Abs: 2.2 10*3/uL (ref 0.7–4.0)
MCH: 27.9 pg (ref 26.0–34.0)
MCHC: 33.3 g/dL (ref 30.0–36.0)
MCV: 83.8 fL (ref 78.0–100.0)
MONO ABS: 0.3 10*3/uL (ref 0.1–1.0)
Monocytes Relative: 5 %
NEUTROS ABS: 4.3 10*3/uL (ref 1.7–7.7)
Neutrophils Relative %: 62 %
PLATELETS: 351 10*3/uL (ref 150–400)
RBC: 4.19 MIL/uL (ref 3.87–5.11)
RDW: 16.3 % — AB (ref 11.5–15.5)
WBC: 6.9 10*3/uL (ref 4.0–10.5)

## 2017-11-17 LAB — LIPASE, BLOOD: Lipase: 22 U/L (ref 11–51)

## 2017-11-17 MED ORDER — IBUPROFEN 600 MG PO TABS: 600.0000 mg | ORAL_TABLET | Freq: Four times a day (QID) | ORAL | 0 refills | Status: DC | PRN

## 2017-11-17 MED ORDER — FENTANYL CITRATE (PF) 100 MCG/2ML IJ SOLN
50.0000 ug | Freq: Once | INTRAMUSCULAR | Status: AC
  Administered 2017-11-17: 50 ug via INTRAVENOUS
  Filled 2017-11-17: qty 2

## 2017-11-17 MED ORDER — IOPAMIDOL (ISOVUE-300) INJECTION 61%
INTRAVENOUS | Status: AC
  Administered 2017-11-17: 100 mL
  Filled 2017-11-17: qty 100

## 2017-11-17 NOTE — ED Notes (Signed)
Patient transported to CT 

## 2017-11-17 NOTE — ED Triage Notes (Signed)
Pt. Stated I have a UTI told by my doctor and given medicine but my Medicaid would not pay for it My pain was bad so I had to come back.

## 2017-11-17 NOTE — ED Notes (Signed)
Patient verbalizes understanding of discharge instructions. Opportunity for questioning and answers were provided. Armband removed by staff, pt discharged from ED ambulatory.   

## 2017-11-17 NOTE — Discharge Instructions (Signed)
Your testing was normal including your CT scan We do not have an answer as to what is causing her pain however it does not appear to be an acute surgical or pathological condition.  Emergency department for severe or worsening pain vomiting or fever  Please use ibuprofen as needed for severe pain.  Follow-up with your family doctor as needed as well.

## 2017-11-17 NOTE — ED Provider Notes (Signed)
[facility] EMERGENCY DEPARTMENT Provider Note   CSN: [id number] Arrival date & time: 11/17/17  1036     History   Chief Complaint Chief Complaint  Patient presents with  . Recurrent UTI    HPI Katherine [name] is a 47 y.o. female.  HPI  Flank pain ongoing for 1 week - comes and goes - sharp and bilateral lower abd pain with this - some itching and burning with urination, increased frequency and urgency.  She has seen her PCP - couple of days ago - was given Tramadol for pain at the clinic - she was Rx an antibiotic - didn't get it filled - pain is getting worse - 9/10, currently  Thursday had 102 fever.  Currently traeted for BRCA Hx of Hysterectomy,  The patient reports she was at the office on Thursday, she was given a preliminary diagnosis of a urinary infection though she states that no testing was done, no urine sample was collected and she was unable to pick up her antibiotic from the pharmacy because of prescription problems.  She reports that this pain is coming on several times an hour, lasting for 20 minutes, gradually goes away.  Mostly located on the left side, left upper quadrant, left flank but sometimes on the right.  Past Medical History:  Diagnosis Date  . Anxiety   . Asthma   . Dysrhythmia    History of SVT-No medications  . Fibromyalgia   . GERD (gastroesophageal reflux disease)   . Hot flashes   . Malignant neoplasm of upper-outer quadrant of right female breast ([place]) 06/2017   right breast  . Migraine   . Palpitations   . Pneumonia    with cavitation of left lower lobe  . Tuberculosis    exposure as a child, get checked frequently  . Uterine fibroid     Patient Active Problem List   Diagnosis Date Noted  . Port-A-Cath in place 09/24/2017  . Malignant neoplasm of upper-outer quadrant of right breast in female, estrogen receptor negative ([place]) 07/03/2017  . Pelvic pain in female 12/21/2016  . Fibroids, intramural 12/21/2016  .  Post-operative state 12/19/2016  . Nocturia 08/09/2015  . Fibromyalgia 06/25/2015  . GERD (gastroesophageal reflux disease) 06/16/2013  . Migraine 06/16/2013  . Atrial tachycardia, paroxysmal ([place]) 03/31/2013  . Headache 12/20/2012  . Syncope 06/20/2012  . Fibroids 03/26/2012  . Family history of breast cancer 01/22/2012  . Seasonal and perennial allergic rhinitis 07/18/2010  . HAIR LOSS 07/04/2010  . ANXIETY DISORDER, SITUATIONAL, MILD 11/17/2009  . CHEST PAIN, LEFT 10/12/2009  . DYSURIA 08/26/2009  . MAMMOGRAM, ABNORMAL, RIGHT, HX OF 04/09/2009  . TOBACCO ABUSE 03/17/2009  . DECREASED HEARING, RIGHT EAR 10/27/2008  . BACK PAIN, LUMBAR 06/06/2007  . HOT FLASHES 07/18/2006    Past Surgical History:  Procedure Laterality Date  . ABDOMINAL HYSTERECTOMY    . BREAST LUMPECTOMY WITH RADIOACTIVE SEED AND SENTINEL LYMPH NODE BIOPSY Right 07/24/2017   Procedure: RIGHT BREAST LUMPECTOMY WITH RADIOACTIVE SEED AND SENTINEL LYMPH NODE BIOPSY;  Surgeon: Erroll Luna, MD;  Location: [facility];  Service: General;  Laterality: Right;  . PORTACATH PLACEMENT Right 07/24/2017   Procedure: INSERTION PORT-A-CATH;  Surgeon: Erroll Luna, MD;  Location: [facility];  Service: General;  Laterality: Right;  . TUBAL LIGATION  1993  . VAGINAL HYSTERECTOMY Bilateral 12/19/2016   Procedure: HYSTERECTOMY VAGINAL;  Surgeon: Lavonia Drafts, MD;  Location: [facility];  Service: Gynecology;  Laterality: Bilateral;  .  WISDOM TOOTH EXTRACTION      OB History    Gravida Para Term Preterm AB Living   '2 2 1 1   2   ' SAB TAB Ectopic Multiple Live Births                   Home Medications    Prior to Admission medications   Medication Sig Start Date End Date Taking? Authorizing Provider  albuterol (PROVENTIL HFA;VENTOLIN HFA) 108 (90 Base) MCG/ACT inhaler Inhale 2 puffs into the lungs every 6 (six) hours as needed for wheezing or shortness of breath.   Yes [provider]  famotidine (PEPCID) 20 MG tablet Take 20 mg by mouth 2 (two) times daily. 11/13/17  Yes [provider]  lidocaine-prilocaine (EMLA) cream Apply to affected area once Patient taking differently: Apply 1 application topically as needed (rash). Apply to affected area once 10/15/17  Yes Nicholas Lose, MD  pantoprazole (PROTONIX) 40 MG tablet Take 40 mg by mouth daily.   Yes [provider]  ranitidine (ZANTAC) 150 MG tablet Take 150 mg by mouth daily. 10/10/17  Yes [provider]  diphenoxylate-atropine (LOMOTIL) 2.5-0.025 MG tablet TAKE 1 TABLET BY MOUTH 4 TIMES DAILY AS NEEDED FOR DIARRHEA OR LOOSE STOOLS 11/13/17   Nicholas Lose, MD  ibuprofen (ADVIL,MOTRIN) 600 MG tablet Take 1 tablet (600 mg total) by mouth every 6 (six) hours as needed. 11/17/17   Noemi Chapel, MD  ipratropium (ATROVENT) 0.03 % nasal spray SPRAY TWO SQUIRTS INTO EACH NOSTRIL TWICE A DAY 07/09/17   [provider]  ondansetron (ZOFRAN) 8 MG tablet Take 1 tablet (8 mg total) 2 (two) times daily as needed by mouth (Nausea or vomiting). 07/31/17   Nicholas Lose, MD  prochlorperazine (COMPAZINE) 10 MG tablet Take 1 tablet (10 mg total) every 6 (six) hours as needed by mouth (Nausea or vomiting). 07/31/17   Nicholas Lose, MD  diltiazem (CARDIZEM SR) 120 MG 12 hr capsule Take 1 capsule (120 mg total) by mouth daily. 05/15/11 12/17/11  Fay Records, MD    Family History Family History  Problem Relation Age of Onset  . Migraines Daughter   . Hypertension Mother   . Heart disease Mother   . Hypertension Father   . Heart disease Father   . Breast cancer Sister 33       deceased at age 2  . Migraines Son   . Hypertension Unknown   . Colon cancer Neg Hx   . Colon polyps Neg Hx   . Diabetes Neg Hx   . Kidney disease Neg Hx   . Liver disease Neg Hx   . Heart attack Neg Hx   . Stroke Neg Hx     Social History Social History   Tobacco Use  . Smoking status: Former Smoker     Packs/day: 0.50    Types: Cigarettes    Last attempt to quit: 07/16/2017    Years since quitting: 0.3  . Smokeless tobacco: Never Used  . Tobacco comment: smokes 1cigarette per day and more if she is stressed  Substance Use Topics  . Alcohol use: No    Alcohol/week: 0.0 oz  . Drug use: Yes    Types: Marijuana    Comment: occasional, for pain. states she hasnt used for a mont; 08/02/17 sometimes     Allergies   Morphine; Shellfish allergy; Penicillins; Codeine; Hydrocodone; and Tomato   Review of Systems Review of Systems  All other systems reviewed and  are negative.    Physical Exam Updated Vital Signs BP 124/81   Pulse 70   Temp 98.6 F (37 C) (Oral)   Resp 16   Ht '5\' 5"'  (1.651 m)   Wt 72.6 kg (160 lb)   LMP 11/13/2016   SpO2 96%   BMI 26.63 kg/m   Physical Exam  Constitutional: She appears well-developed and well-nourished. No distress.  HENT:  Head: Normocephalic and atraumatic.  Mouth/Throat: Oropharynx is clear and moist. No oropharyngeal exudate.  Eyes: Conjunctivae and EOM are normal. Pupils are equal, round, and reactive to light. Right eye exhibits no discharge. Left eye exhibits no discharge. No scleral icterus.  Neck: Normal range of motion. Neck supple. No JVD present. No thyromegaly present.  Cardiovascular: Normal rate, regular rhythm, normal heart sounds and intact distal pulses. Exam reveals no gallop and no friction rub.  No murmur heard. Pulmonary/Chest: Effort normal and breath sounds normal. No respiratory distress. She has no wheezes. She has no rales.  Abdominal: Soft. Bowel sounds are normal. She exhibits no distension and no mass. There is tenderness ( ttp in the bilateral lower quadrants with CVA ttp on the L).  Some guarding - no peritoneal signs.  Musculoskeletal: Normal range of motion. She exhibits no edema or tenderness.  Lymphadenopathy:    She has no cervical adenopathy.  Neurological: She is alert. Coordination normal.  Skin: Skin  is warm and dry. No rash noted. No erythema.  Psychiatric: She has a normal mood and affect. Her behavior is normal.  Nursing note and vitals reviewed.   ED Treatments / Results  Labs (all labs ordered are listed, but only abnormal results are displayed) Labs Reviewed  URINALYSIS, ROUTINE W REFLEX MICROSCOPIC - Abnormal; Notable for the following components:      Result Value   APPearance HAZY (*)    All other components within normal limits  CBC WITH DIFFERENTIAL/PLATELET - Abnormal; Notable for the following components:   Hemoglobin 11.7 (*)    HCT 35.1 (*)    RDW 16.3 (*)    All other components within normal limits  COMPREHENSIVE METABOLIC PANEL - Abnormal; Notable for the following components:   Sodium 134 (*)    Chloride 100 (*)    Glucose, Bld 134 (*)    All other components within normal limits  LIPASE, BLOOD     Radiology Ct Abdomen Pelvis W Contrast  Result Date: 11/17/2017 CLINICAL DATA:  Right flank pain the lower abdomen since Monday. EXAM: CT ABDOMEN AND PELVIS WITH CONTRAST TECHNIQUE: Multidetector CT imaging of the abdomen and pelvis was performed using the standard protocol following bolus administration of intravenous contrast. CONTRAST:  190m ISOVUE-300 IOPAMIDOL (ISOVUE-300) INJECTION 61% COMPARISON:  CT abdomen and pelvis 09/21/2017 FINDINGS: Lower chest: Minimal atelectasis present. Lung bases are otherwise clear. Heart size is normal. Hepatobiliary: No focal liver abnormality is seen. No gallstones, gallbladder wall thickening, or biliary dilatation. Pancreas: Unremarkable. No pancreatic ductal dilatation or surrounding inflammatory changes. Spleen: Normal in size without focal abnormality. Adrenals/Urinary Tract: Adrenal glands are normal. A simple cyst in the right kidney measures 10 mm. Kidneys and ureters are otherwise within normal limits. Stomach/Bowel: Stomach and duodenum are within normal limits. Small bowel is unremarkable. Terminal ileum is within  normal limits. Appendix is visualized and normal. The ascending and transverse colon are normal. Descending and sigmoid colon are normal. Vascular/Lymphatic: No significant vascular findings are present. No enlarged abdominal or pelvic lymph nodes. Reproductive: Status post hysterectomy. No adnexal masses. Other: No  abdominal wall hernia or abnormality. No abdominopelvic ascites. Musculoskeletal: Vertebral body heights alignment are normal. No focal lytic or blastic lesions are present. The pelvis is intact. Hips are located and normal bilaterally. IMPRESSION: 1. No acute or focal lesion to explain the patient's right flank and lower abdominal pain. 2. Negative CT of the abdomen and pelvis. 3. Hysterectomy. Electronically Signed   By: San Morelle M.D.   On: 11/17/2017 16:40    Procedures Procedures (including critical care time)  Medications Ordered in ED Medications  fentaNYL (SUBLIMAZE) injection 50 mcg (50 mcg Intravenous Given 11/17/17 1405)  iopamidol (ISOVUE-300) 61 % injection (100 mLs  Contrast Given 11/17/17 1522)     Initial Impression / Assessment and Plan / ED Course  I have reviewed the triage vital signs and the nursing notes.  Pertinent labs & imaging results that were available during my care of the patient were reviewed by me and considered in my medical decision making (see chart for details).     The patient is relatively immunocompromised secondary to her chemotherapy and cancer, at this point she will need to have further testing including a CT scan to further evaluate her abdomen, look for mid metastatic disease, infectious causes including abscess or retroperitoneal findings.  She does not of a surgical abdomen.  Urinalysis is totally normal without signs of infection.  CT scan negative for acute findings, labs reviewed showing no acute findings either.  Patient informed, no urinary infection, CT scan shows no signs of pathological or surgical findings.  Stable for  discharge  Final Clinical Impressions(s) / ED Diagnoses   Final diagnoses:  Abdominal pain, left lateral    ED Discharge Orders        Ordered    ibuprofen (ADVIL,MOTRIN) 600 MG tablet  Every 6 hours PRN     11/17/17 1708       Noemi Chapel, MD 11/17/17 1709

## 2017-11-19 ENCOUNTER — Ambulatory Visit: Payer: Self-pay | Admitting: Hematology and Oncology

## 2017-11-19 ENCOUNTER — Other Ambulatory Visit: Payer: Self-pay

## 2017-11-20 ENCOUNTER — Inpatient Hospital Stay: Payer: Medicaid Other

## 2017-11-20 ENCOUNTER — Inpatient Hospital Stay (HOSPITAL_BASED_OUTPATIENT_CLINIC_OR_DEPARTMENT_OTHER): Payer: Medicaid Other | Admitting: Hematology and Oncology

## 2017-11-20 DIAGNOSIS — Z171 Estrogen receptor negative status [ER-]: Secondary | ICD-10-CM | POA: Diagnosis not present

## 2017-11-20 DIAGNOSIS — C50411 Malignant neoplasm of upper-outer quadrant of right female breast: Secondary | ICD-10-CM | POA: Diagnosis not present

## 2017-11-20 DIAGNOSIS — Z79899 Other long term (current) drug therapy: Secondary | ICD-10-CM | POA: Diagnosis not present

## 2017-11-20 DIAGNOSIS — R42 Dizziness and giddiness: Secondary | ICD-10-CM | POA: Diagnosis not present

## 2017-11-20 DIAGNOSIS — R197 Diarrhea, unspecified: Secondary | ICD-10-CM | POA: Diagnosis not present

## 2017-11-20 DIAGNOSIS — G620 Drug-induced polyneuropathy: Secondary | ICD-10-CM | POA: Diagnosis not present

## 2017-11-20 DIAGNOSIS — D6481 Anemia due to antineoplastic chemotherapy: Secondary | ICD-10-CM

## 2017-11-20 DIAGNOSIS — Z95828 Presence of other vascular implants and grafts: Secondary | ICD-10-CM

## 2017-11-20 DIAGNOSIS — T451X5S Adverse effect of antineoplastic and immunosuppressive drugs, sequela: Secondary | ICD-10-CM | POA: Diagnosis not present

## 2017-11-20 DIAGNOSIS — R5383 Other fatigue: Secondary | ICD-10-CM

## 2017-11-20 DIAGNOSIS — L659 Nonscarring hair loss, unspecified: Secondary | ICD-10-CM

## 2017-11-20 DIAGNOSIS — Z5111 Encounter for antineoplastic chemotherapy: Secondary | ICD-10-CM | POA: Diagnosis not present

## 2017-11-20 LAB — CBC WITH DIFFERENTIAL/PLATELET
Basophils Absolute: 0 10*3/uL (ref 0.0–0.1)
Basophils Relative: 1 %
EOS ABS: 0.1 10*3/uL (ref 0.0–0.5)
Eosinophils Relative: 1 %
HCT: 32.7 % — ABNORMAL LOW (ref 34.8–46.6)
HEMOGLOBIN: 10.5 g/dL — AB (ref 11.6–15.9)
LYMPHS ABS: 2.4 10*3/uL (ref 0.9–3.3)
Lymphocytes Relative: 38 %
MCH: 27.3 pg (ref 25.1–34.0)
MCHC: 32.1 g/dL (ref 31.5–36.0)
MCV: 85.2 fL (ref 79.5–101.0)
MONO ABS: 0.6 10*3/uL (ref 0.1–0.9)
MONOS PCT: 9 %
Neutro Abs: 3.2 10*3/uL (ref 1.5–6.5)
Neutrophils Relative %: 51 %
PLATELETS: 304 10*3/uL (ref 145–400)
RBC: 3.84 MIL/uL (ref 3.70–5.45)
RDW: 16.4 % — ABNORMAL HIGH (ref 11.2–14.5)
Smear Review: 2
WBC: 6.3 10*3/uL (ref 3.9–10.3)

## 2017-11-20 LAB — COMPREHENSIVE METABOLIC PANEL
ALBUMIN: 3.6 g/dL (ref 3.5–5.0)
ALK PHOS: 81 U/L (ref 40–150)
ALT: 18 U/L (ref 0–55)
AST: 12 U/L (ref 5–34)
Anion gap: 10 (ref 3–11)
BUN: 7 mg/dL (ref 7–26)
CALCIUM: 9.4 mg/dL (ref 8.4–10.4)
CO2: 24 mmol/L (ref 22–29)
CREATININE: 0.65 mg/dL (ref 0.60–1.10)
Chloride: 107 mmol/L (ref 98–109)
GFR calc Af Amer: >60 mL/min (ref >60)
GFR calc non Af Amer: >60 mL/min (ref >60)
Glucose, Bld: 99 mg/dL (ref 70–140)
Potassium: 3.9 mmol/L (ref 3.5–5.1)
SODIUM: 141 mmol/L (ref 136–145)
Total Bilirubin: <0.2 mg/dL — ABNORMAL LOW (ref 0.2–1.2)
Total Protein: 7 g/dL (ref 6.4–8.3)

## 2017-11-20 MED ORDER — DIPHENHYDRAMINE HCL 50 MG/ML IJ SOLN
INTRAMUSCULAR | Status: AC
  Filled 2017-11-20: qty 1

## 2017-11-20 MED ORDER — DIPHENHYDRAMINE HCL 50 MG/ML IJ SOLN
50.0000 mg | Freq: Once | INTRAMUSCULAR | Status: AC
  Administered 2017-11-20: 50 mg via INTRAVENOUS

## 2017-11-20 MED ORDER — SODIUM CHLORIDE 0.9 % IV SOLN
20.0000 mg | Freq: Once | INTRAVENOUS | Status: AC
  Administered 2017-11-20: 20 mg via INTRAVENOUS
  Filled 2017-11-20: qty 2

## 2017-11-20 MED ORDER — TRASTUZUMAB CHEMO 150 MG IV SOLR
150.0000 mg | Freq: Once | INTRAVENOUS | Status: AC
  Administered 2017-11-20: 150 mg via INTRAVENOUS
  Filled 2017-11-20: qty 7.14

## 2017-11-20 MED ORDER — HEPARIN SOD (PORK) LOCK FLUSH 100 UNIT/ML IV SOLN
500.0000 [IU] | Freq: Once | INTRAVENOUS | Status: AC | PRN
  Administered 2017-11-20: 500 [IU]
  Filled 2017-11-20: qty 5

## 2017-11-20 MED ORDER — SODIUM CHLORIDE 0.9 % IV SOLN
10.0000 mg | Freq: Once | INTRAVENOUS | Status: AC
  Administered 2017-11-20: 10 mg via INTRAVENOUS
  Filled 2017-11-20: qty 1

## 2017-11-20 MED ORDER — SODIUM CHLORIDE 0.9 % IV SOLN
Freq: Once | INTRAVENOUS | Status: AC
  Administered 2017-11-20: 10:00:00 via INTRAVENOUS

## 2017-11-20 MED ORDER — PACLITAXEL CHEMO INJECTION 300 MG/50ML
80.0000 mg/m2 | Freq: Once | INTRAVENOUS | Status: AC
  Administered 2017-11-20: 144 mg via INTRAVENOUS
  Filled 2017-11-20: qty 24

## 2017-11-20 MED ORDER — FAMOTIDINE IN NACL 20-0.9 MG/50ML-% IV SOLN: 20.0000 mg | Freq: Once | INTRAVENOUS | Status: DC

## 2017-11-20 MED ORDER — ACETAMINOPHEN 325 MG PO TABS
650.0000 mg | ORAL_TABLET | Freq: Once | ORAL | Status: AC
  Administered 2017-11-20: 650 mg via ORAL

## 2017-11-20 MED ORDER — ACETAMINOPHEN 325 MG PO TABS
ORAL_TABLET | ORAL | Status: AC
  Filled 2017-11-20: qty 2

## 2017-11-20 MED ORDER — SODIUM CHLORIDE 0.9% FLUSH
10.0000 mL | INTRAVENOUS | Status: DC | PRN
  Administered 2017-11-20: 10 mL via INTRAVENOUS
  Filled 2017-11-20: qty 10

## 2017-11-20 MED ORDER — SODIUM CHLORIDE 0.9% FLUSH
10.0000 mL | INTRAVENOUS | Status: DC | PRN
  Administered 2017-11-20: 10 mL
  Filled 2017-11-20: qty 10

## 2017-11-20 NOTE — Assessment & Plan Note (Signed)
07/24/2017:Right lumpectomy: IDC grade 3, 1.8 cm, 0/2 lymph nodes negative,ER 0%, PR 0%, Ki-67 40%, HER-2 positive ratio 3.59, T1CN0 stage I a  Pathology counseling: I discussed the final pathology report of the patient provided a copy of this report. I discussed the margins as well as lymph node surgeries. We also discussed the final staging along with previously performed ER/PR and HER-2/neu testing.  Recommendation: 1. Adjuvant chemotherapy with Taxol Herceptin followed by Herceptin maintenance for 1 year 2. followed by adjuvant radiation therapy ------------------------------------------------------------------------------------------------------------------------------------ Current treatment:Cycle11Taxol Herceptin Labs reviewed Echocardiogram 08/03/2017: EF 60-65%  Chemo toxicities: 1.Severe diarrhea: I instructed her to start taking Lomotil and Imodium. I gave her instructions on how to take them. 2.fatigue related to chemotherapy 3.Alopecia 4.Intermittent lightheadedness 5.Chemotherapy-induced anemia 6.  Emergency department visit for recurrent UTI  Patient will complete chemotherapy next week. I will make sure she has an appointment with radiation oncology. I will see her at the end of radiation.

## 2017-11-20 NOTE — Patient Instructions (Signed)
[facility] Discharge Instructions for Patients Receiving Chemotherapy  Today you received the following chemotherapy agents herceptin/taxol   To help prevent nausea and vomiting after your treatment, we encourage you to take your nausea medication as directed  If you develop nausea and vomiting that is not controlled by your nausea medication, call the clinic.   BELOW ARE SYMPTOMS THAT SHOULD BE REPORTED IMMEDIATELY:  *FEVER GREATER THAN 100.5 F  *CHILLS WITH OR WITHOUT FEVER  NAUSEA AND VOMITING THAT IS NOT CONTROLLED WITH YOUR NAUSEA MEDICATION  *UNUSUAL SHORTNESS OF BREATH  *UNUSUAL BRUISING OR BLEEDING  TENDERNESS IN MOUTH AND THROAT WITH OR WITHOUT PRESENCE OF ULCERS  *URINARY PROBLEMS  *BOWEL PROBLEMS  UNUSUAL RASH Items with * indicate a potential emergency and should be followed up as soon as possible.  Feel free to call the clinic you have any questions or concerns. The clinic phone number is [phone].  

## 2017-11-20 NOTE — Progress Notes (Signed)
 Patient Care Team: Funches, Josalyn, MD as PCP - General (Family Medicine) Cornett, Thomas, MD as Consulting Physician (General Surgery)  DIAGNOSIS:  Encounter Diagnosis  Name Primary?  . Malignant neoplasm of upper-outer quadrant of right breast in female, estrogen receptor negative (HCC)     SUMMARY OF ONCOLOGIC HISTORY:   Malignant neoplasm of upper-outer quadrant of right breast in female, estrogen receptor negative ([facility]CC)   06/26/2017 Initial Diagnosis    Right breast biopsy 8:30 position 8 cm from nipple: IDC with DCIS, second biopsy 6 cm from nipple fibrocystic changes: Grade 3, ER 0%, PR 0%, Ki-67 40%, HER-2 positive ratio 3.59; mammogram and ultrasound revealed 1.9 cm mass at 8:30 position, adjacent 0.7 cm cystic mass, T1c N0 stage IA AJCC 8       07/24/2017 Surgery    Right lumpectomy: IDC grade 3, 1.8 cm, 0/2 lymph nodes negative,ER 0%, PR 0%, Ki-67 40%, HER-2 positive ratio 3.59, T1CN0 stage I a       09/10/2017 -  Chemotherapy    Taxol Herceptin weekly x12 followed by Herceptin maintenance for 1 year       CHIEF COMPLIANT: Cycle 11 Taxol Herceptin  INTERVAL HISTORY: Katherine [name] is a 46-year-old with above-mentioned history of right breast cancer treated with lumpectomy and is currently in adjuvant chemotherapy with Taxol and Herceptin.  She was in the emergency room with suspected UTI symptoms but she did not receive any antibiotics.  She is afebrile today.  She still has mild itching when she passes urine.  She is otherwise tolerating chemotherapy fairly well.  She denies any significant neuropathy.  She did complain of the tips of the fingers she was not able to feel quite normally.  REVIEW OF SYSTEMS:   Constitutional: Denies fevers, chills or abnormal weight loss Eyes: Denies blurriness of vision Ears, nose, mouth, throat, and face: Denies mucositis or sore throat Respiratory: Denies cough, dyspnea or wheezes Cardiovascular: Denies palpitation, chest  discomfort Gastrointestinal:  Denies nausea, heartburn or change in bowel habits Skin: Denies abnormal skin rashes Lymphatics: Denies new lymphadenopathy or easy bruising Neurological:Denies numbness, tingling or new weaknesses Behavioral/Psych: Mood is stable, no new changes  Extremities: No lower extremity edema  All other systems were reviewed with the patient and are negative.  I have reviewed the past medical history, past surgical history, social history and family history with the patient and they are unchanged from previous note.  ALLERGIES:  is allergic to morphine; shellfish allergy; penicillins; codeine; hydrocodone; and tomato.  MEDICATIONS:  Current Outpatient Medications  Medication Sig Dispense Refill  . albuterol (PROVENTIL HFA;VENTOLIN HFA) 108 (90 Base) MCG/ACT inhaler Inhale 2 puffs into the lungs every 6 (six) hours as needed for wheezing or shortness of breath.    . diphenoxylate-atropine (LOMOTIL) 2.5-0.025 MG tablet TAKE 1 TABLET BY MOUTH 4 TIMES DAILY AS NEEDED FOR DIARRHEA OR LOOSE STOOLS 30 tablet 0  . famotidine (PEPCID) 20 MG tablet Take 20 mg by mouth 2 (two) times daily.  1  . ibuprofen (ADVIL,MOTRIN) 600 MG tablet Take 1 tablet (600 mg total) by mouth every 6 (six) hours as needed. 30 tablet 0  . ipratropium (ATROVENT) 0.03 % nasal spray SPRAY TWO SQUIRTS INTO EACH NOSTRIL TWICE A DAY  1  . lidocaine-prilocaine (EMLA) cream Apply to affected area once (Patient taking differently: Apply 1 application topically as needed (rash). Apply to affected area once) 30 g 3  . ondansetron (ZOFRAN) 8 MG tablet Take 1 tablet (8 mg total) 2 (  two) times daily as needed by mouth (Nausea or vomiting). 30 tablet 1  . pantoprazole (PROTONIX) 40 MG tablet Take 40 mg by mouth daily.    . prochlorperazine (COMPAZINE) 10 MG tablet Take 1 tablet (10 mg total) every 6 (six) hours as needed by mouth (Nausea or vomiting). 30 tablet 1  . ranitidine (ZANTAC) 150 MG tablet Take 150 mg by  mouth daily.  4   No current facility-administered medications for this visit.     PHYSICAL EXAMINATION: ECOG PERFORMANCE STATUS: 1 - Symptomatic but completely ambulatory  Vitals:   11/20/17 0813  BP: 134/88  Pulse: 66  Resp: 18  Temp: 98.7 F (37.1 C)  SpO2: 100%   Filed Weights   11/20/17 0813  Weight: 159 lb (72.1 kg)    GENERAL:alert, no distress and comfortable SKIN: skin color, texture, turgor are normal, no rashes or significant lesions EYES: normal, Conjunctiva are pink and non-injected, sclera clear OROPHARYNX:no exudate, no erythema and lips, buccal mucosa, and tongue normal  NECK: supple, thyroid normal size, non-tender, without nodularity LYMPH:  no palpable lymphadenopathy in the cervical, axillary or inguinal LUNGS: clear to auscultation and percussion with normal breathing effort HEART: regular rate & rhythm and no murmurs and no lower extremity edema ABDOMEN:abdomen soft, non-tender and normal bowel sounds MUSCULOSKELETAL:no cyanosis of digits and no clubbing  NEURO: alert & oriented x 3 with fluent speech, no focal motor/sensory deficits EXTREMITIES: No lower extremity edema  LABORATORY DATA:  I have reviewed the data as listed CMP Latest Ref Rng & Units 11/17/2017 11/12/2017 11/05/2017  Glucose 65 - 99 mg/dL 134(H) 109 101  BUN 6 - 20 mg/dL _0 Creatinine 0.44 - 1.00 mg/dL 0.60 0.66 0.68  Sodium 135 - 145 mmol/L 134(L) 141 140  Potassium 3.5 - 5.1 mmol/L 3.9 3.5 3.5  Chloride 101 - 111 mmol/L 100(L) 105 107  CO2 22 - 32 mmol/L _1 Calcium 8.9 - 10.3 mg/dL 9.0 9.4 9.0  Total Protein 6.5 - 8.1 g/dL 6.8 6.7 6.8  Total Bilirubin 0.3 - 1.2 mg/dL 0.5 <0.2(L) 0.2  Alkaline Phos 38 - 126 U/L 77 83 87  AST 15 - 41 U/L _2 ALT 14 - 54 U/L 25 55 23    Lab Results  Component Value Date   WBC 6.3 11/20/2017   HGB 10.5 (L) 11/20/2017   HCT 32.7 (L) 11/20/2017   MCV 85.2 11/20/2017   PLT 304 11/20/2017   NEUTROABS 3.2 11/20/2017     ASSESSMENT & PLAN:  Malignant neoplasm of upper-outer quadrant of right breast in female, estrogen receptor negative ([facility]CC) 07/24/2017:Right lumpectomy: IDC grade 3, 1.8 cm, 0/2 lymph nodes negative,ER 0%, PR 0%, Ki-67 40%, HER-2 positive ratio 3.59, T1CN0 stage I a  Pathology counseling: I discussed the final pathology report of the patient provided a copy of this report. I discussed the margins as well as lymph node surgeries. We also discussed the final staging along with previously performed ER/PR and HER-2/neu testing.  Recommendation: 1. Adjuvant chemotherapy with Taxol Herceptin followed by Herceptin maintenance for 1 year 2. followed by adjuvant radiation therapy ------------------------------------------------------------------------------------------------------------------------------------ Current treatment:Cycle11Taxol Herceptin Labs reviewed Echocardiogram 08/03/2017: EF 60-65%  Chemo toxicities: 1.Severe diarrhea: I instructed her to start taking Lomotil and Imodium. I gave her instructions on how to take them. 2.fatigue related to chemotherapy 3.Alopecia 4.Intermittent lightheadedness 5.Chemotherapy-induced anemia 6.  Emergency department visit for recurrent UTI 7.  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 I will make sure she has an appointment with radiation oncology. I will see her with every other Herceptin treatment with labs.  I spent 25 minutes talking to the patient of which more than half was spent in counseling and coordination of care.  No orders of the defined types were placed in this encounter.  The patient has a good understanding of the overall plan. she agrees with it. she will call with any problems that may develop before the next visit here.   Viinay K Gudena, MD 11/20/17    

## 2017-11-22 ENCOUNTER — Encounter: Payer: Self-pay | Admitting: Radiation Oncology

## 2017-11-26 ENCOUNTER — Other Ambulatory Visit: Payer: Self-pay | Admitting: Hematology and Oncology

## 2017-11-26 ENCOUNTER — Inpatient Hospital Stay: Payer: Medicaid Other | Attending: Hematology and Oncology

## 2017-11-26 ENCOUNTER — Other Ambulatory Visit: Payer: Self-pay

## 2017-11-26 ENCOUNTER — Ambulatory Visit: Payer: Self-pay

## 2017-11-26 VITALS — BP 123/84 | HR 80 | Temp 99.0°F | Resp 18

## 2017-11-26 DIAGNOSIS — G620 Drug-induced polyneuropathy: Secondary | ICD-10-CM | POA: Insufficient documentation

## 2017-11-26 DIAGNOSIS — C50411 Malignant neoplasm of upper-outer quadrant of right female breast: Secondary | ICD-10-CM | POA: Insufficient documentation

## 2017-11-26 DIAGNOSIS — Z5112 Encounter for antineoplastic immunotherapy: Secondary | ICD-10-CM | POA: Insufficient documentation

## 2017-11-26 DIAGNOSIS — T451X5S Adverse effect of antineoplastic and immunosuppressive drugs, sequela: Secondary | ICD-10-CM | POA: Insufficient documentation

## 2017-11-26 DIAGNOSIS — Z171 Estrogen receptor negative status [ER-]: Secondary | ICD-10-CM

## 2017-11-26 DIAGNOSIS — R232 Flushing: Secondary | ICD-10-CM | POA: Insufficient documentation

## 2017-11-26 DIAGNOSIS — Z79899 Other long term (current) drug therapy: Secondary | ICD-10-CM | POA: Diagnosis not present

## 2017-11-26 DIAGNOSIS — Z5111 Encounter for antineoplastic chemotherapy: Secondary | ICD-10-CM | POA: Diagnosis not present

## 2017-11-26 LAB — COMPREHENSIVE METABOLIC PANEL
ALT: 53 U/L (ref 0–55)
ANION GAP: 10 (ref 3–11)
AST: 31 U/L (ref 5–34)
Albumin: 3.6 g/dL (ref 3.5–5.0)
Alkaline Phosphatase: 74 U/L (ref 40–150)
BILIRUBIN TOTAL: 0.3 mg/dL (ref 0.2–1.2)
BUN: 9 mg/dL (ref 7–26)
CO2: 24 mmol/L (ref 22–29)
Calcium: 9.5 mg/dL (ref 8.4–10.4)
Chloride: 107 mmol/L (ref 98–109)
Creatinine, Ser: 0.71 mg/dL (ref 0.60–1.10)
GFR calc Af Amer: >60 mL/min (ref >60)
Glucose, Bld: 119 mg/dL (ref 70–140)
POTASSIUM: 3.7 mmol/L (ref 3.5–5.1)
Sodium: 141 mmol/L (ref 136–145)
TOTAL PROTEIN: 6.7 g/dL (ref 6.4–8.3)

## 2017-11-26 LAB — CBC WITH DIFFERENTIAL/PLATELET
BASOS ABS: 0 10*3/uL (ref 0.0–0.1)
Basophils Relative: 0 %
EOS PCT: 1 %
Eosinophils Absolute: 0.1 10*3/uL (ref 0.0–0.5)
HCT: 33.3 % — ABNORMAL LOW (ref 34.8–46.6)
Hemoglobin: 10.5 g/dL — ABNORMAL LOW (ref 11.6–15.9)
LYMPHS PCT: 36 %
Lymphs Abs: 2.2 10*3/uL (ref 0.9–3.3)
MCH: 27 pg (ref 25.1–34.0)
MCHC: 31.5 g/dL (ref 31.5–36.0)
MCV: 85.6 fL (ref 79.5–101.0)
Monocytes Absolute: 0.3 10*3/uL (ref 0.1–0.9)
Monocytes Relative: 4 %
Neutro Abs: 3.5 10*3/uL (ref 1.5–6.5)
Neutrophils Relative %: 59 %
PLATELETS: 304 10*3/uL (ref 145–400)
RBC: 3.89 MIL/uL (ref 3.70–5.45)
RDW: 16.9 % — ABNORMAL HIGH (ref 11.2–14.5)
WBC: 6 10*3/uL (ref 3.9–10.3)

## 2017-11-26 MED ORDER — DIPHENHYDRAMINE HCL 50 MG/ML IJ SOLN
50.0000 mg | Freq: Once | INTRAMUSCULAR | Status: AC
  Administered 2017-11-26: 50 mg via INTRAVENOUS

## 2017-11-26 MED ORDER — HEPARIN SOD (PORK) LOCK FLUSH 100 UNIT/ML IV SOLN
500.0000 [IU] | Freq: Once | INTRAVENOUS | Status: AC | PRN
  Administered 2017-11-26: 500 [IU]
  Filled 2017-11-26: qty 5

## 2017-11-26 MED ORDER — COLD PACK MISC ONCOLOGY
1.0000 | Freq: Once | Status: DC | PRN
  Filled 2017-11-26: qty 1

## 2017-11-26 MED ORDER — SODIUM CHLORIDE 0.9 % IV SOLN
450.0000 mg | Freq: Once | INTRAVENOUS | Status: AC
  Administered 2017-11-26: 450 mg via INTRAVENOUS
  Filled 2017-11-26: qty 21.43

## 2017-11-26 MED ORDER — DEXAMETHASONE SODIUM PHOSPHATE 100 MG/10ML IJ SOLN
20.0000 mg | Freq: Once | INTRAMUSCULAR | Status: AC
  Administered 2017-11-26: 20 mg via INTRAVENOUS
  Filled 2017-11-26: qty 2

## 2017-11-26 MED ORDER — DIPHENHYDRAMINE HCL 50 MG/ML IJ SOLN
INTRAMUSCULAR | Status: AC
  Filled 2017-11-26: qty 1

## 2017-11-26 MED ORDER — SODIUM CHLORIDE 0.9 % IV SOLN
Freq: Once | INTRAVENOUS | Status: AC
  Administered 2017-11-26: 09:00:00 via INTRAVENOUS

## 2017-11-26 MED ORDER — SODIUM CHLORIDE 0.9% FLUSH
10.0000 mL | INTRAVENOUS | Status: DC | PRN
  Administered 2017-11-26: 10 mL
  Filled 2017-11-26: qty 10

## 2017-11-26 MED ORDER — SODIUM CHLORIDE 0.9 % IV SOLN
80.0000 mg/m2 | Freq: Once | INTRAVENOUS | Status: AC
  Administered 2017-11-26: 144 mg via INTRAVENOUS
  Filled 2017-11-26: qty 24

## 2017-11-26 MED ORDER — LIDOCAINE-PRILOCAINE 2.5-2.5 % EX CREA: TOPICAL_CREAM | CUTANEOUS | 3 refills | Status: DC

## 2017-11-26 MED ORDER — ACETAMINOPHEN 325 MG PO TABS
650.0000 mg | ORAL_TABLET | Freq: Once | ORAL | Status: AC
  Administered 2017-11-26: 650 mg via ORAL

## 2017-11-26 MED ORDER — ACETAMINOPHEN 325 MG PO TABS
ORAL_TABLET | ORAL | Status: AC
  Filled 2017-11-26: qty 2

## 2017-11-26 MED ORDER — SODIUM CHLORIDE 0.9 % IV SOLN
20.0000 mg | Freq: Once | INTRAVENOUS | Status: AC
  Administered 2017-11-26: 20 mg via INTRAVENOUS
  Filled 2017-11-26: qty 2

## 2017-11-26 MED ORDER — FAMOTIDINE IN NACL 20-0.9 MG/50ML-% IV SOLN: 20.0000 mg | Freq: Once | INTRAVENOUS | Status: DC

## 2017-11-26 NOTE — Patient Instructions (Signed)
[facility] Discharge Instructions for Patients Receiving Chemotherapy  Today you received the following chemotherapy agents Taxol and Herceptin  To help prevent nausea and vomiting after your treatment, we encourage you to take your nausea medication as directed   If you develop nausea and vomiting that is not controlled by your nausea medication, call the clinic.   BELOW ARE SYMPTOMS THAT SHOULD BE REPORTED IMMEDIATELY:  *FEVER GREATER THAN 100.5 F  *CHILLS WITH OR WITHOUT FEVER  NAUSEA AND VOMITING THAT IS NOT CONTROLLED WITH YOUR NAUSEA MEDICATION  *UNUSUAL SHORTNESS OF BREATH  *UNUSUAL BRUISING OR BLEEDING  TENDERNESS IN MOUTH AND THROAT WITH OR WITHOUT PRESENCE OF ULCERS  *URINARY PROBLEMS  *BOWEL PROBLEMS  UNUSUAL RASH Items with * indicate a potential emergency and should be followed up as soon as possible.  Feel free to call the clinic should you have any questions or concerns. The clinic phone number is [phone].  Please show the CHEMO ALERT CARD at check-in to the Emergency Department and triage nurse.   

## 2017-12-04 ENCOUNTER — Encounter (HOSPITAL_COMMUNITY): Payer: Self-pay | Admitting: Emergency Medicine

## 2017-12-04 ENCOUNTER — Ambulatory Visit (HOSPITAL_COMMUNITY)
Admission: EM | Admit: 2017-12-04 | Discharge: 2017-12-04 | Disposition: A | Discharge status: Home / Self Care | Payer: Medicaid Other | Attending: Family Medicine | Admitting: Family Medicine

## 2017-12-04 DIAGNOSIS — Z9221 Personal history of antineoplastic chemotherapy: Secondary | ICD-10-CM | POA: Insufficient documentation

## 2017-12-04 DIAGNOSIS — Z885 Allergy status to narcotic agent status: Secondary | ICD-10-CM | POA: Diagnosis not present

## 2017-12-04 DIAGNOSIS — N390 Urinary tract infection, site not specified: Secondary | ICD-10-CM | POA: Diagnosis present

## 2017-12-04 DIAGNOSIS — Z87891 Personal history of nicotine dependence: Secondary | ICD-10-CM | POA: Insufficient documentation

## 2017-12-04 DIAGNOSIS — Z853 Personal history of malignant neoplasm of breast: Secondary | ICD-10-CM | POA: Diagnosis not present

## 2017-12-04 DIAGNOSIS — Z79899 Other long term (current) drug therapy: Secondary | ICD-10-CM | POA: Diagnosis not present

## 2017-12-04 DIAGNOSIS — Z91013 Allergy to seafood: Secondary | ICD-10-CM | POA: Insufficient documentation

## 2017-12-04 DIAGNOSIS — N3090 Cystitis, unspecified without hematuria: Secondary | ICD-10-CM | POA: Diagnosis not present

## 2017-12-04 DIAGNOSIS — Z880 Allergy status to penicillin: Secondary | ICD-10-CM | POA: Diagnosis not present

## 2017-12-04 LAB — POCT URINALYSIS DIP (DEVICE)
Bilirubin Urine: NEGATIVE
GLUCOSE, UA: NEGATIVE mg/dL
Ketones, ur: 15 mg/dL — AB
NITRITE: NEGATIVE
Protein, ur: 100 mg/dL — AB
UROBILINOGEN UA: 0.2 mg/dL (ref 0.0–1.0)
pH: 6.5 (ref 5.0–8.0)

## 2017-12-04 MED ORDER — SULFAMETHOXAZOLE-TRIMETHOPRIM 800-160 MG PO TABS: 1.0000 | ORAL_TABLET | Freq: Two times a day (BID) | ORAL | 0 refills | Status: AC

## 2017-12-04 NOTE — Discharge Instructions (Addendum)
Your urine was positive for an urinary tract infection. Start bactrim as directed. Keep hydrated, your urine should be clear to pale yellow in color. Cytology sent, you will be contacted with any positive results that requires further treatment. Refrain from sexual activity for the next 7 days. Monitor for any worsening of symptoms, fever, abdominal pain, nausea, vomiting, flank pain, dizziness, weakness, go to the emergency department for further evaluation.

## 2017-12-04 NOTE — ED Provider Notes (Signed)
[place]    CSN: [id number] Arrival date & time: 12/04/17  [place]     History   Chief Complaint Chief Complaint  Patient presents with  . Urinary Tract Infection    HPI Katherine [name] is a 47 y.o. female.   47 year old female with history of breast cancer finished chemotherapy on 11/26/2017, hysterectomy comes in for 1 day history of urinary frequency, dysuria, hesitancy, urgency.  States feels low abdominal pain that has pressure.  Denies fever, chills, night sweats.  Denies nausea, vomiting, diarrhea.  Denies vaginal discharge, itching/pain, spotting.  However, patient would like STD testing.       Past Medical History:  Diagnosis Date  . Anxiety   . Asthma   . Dysrhythmia    History of SVT-No medications  . Fibromyalgia   . GERD (gastroesophageal reflux disease)   . Hot flashes   . Malignant neoplasm of upper-outer quadrant of right female breast ([place]) 06/2017   right breast  . Migraine   . Palpitations   . Pneumonia    with cavitation of left lower lobe  . Tuberculosis    exposure as a child, get checked frequently  . Uterine fibroid     Patient Active Problem List   Diagnosis Date Noted  . Port-A-Cath in place 09/24/2017  . Malignant neoplasm of upper-outer quadrant of right breast in female, estrogen receptor negative ([place]) 07/03/2017  . Pelvic pain in female 12/21/2016  . Fibroids, intramural 12/21/2016  . Post-operative state 12/19/2016  . Nocturia 08/09/2015  . Fibromyalgia 06/25/2015  . GERD (gastroesophageal reflux disease) 06/16/2013  . Migraine 06/16/2013  . Atrial tachycardia, paroxysmal ([place]) 03/31/2013  . Headache 12/20/2012  . Syncope 06/20/2012  . Fibroids 03/26/2012  . Family history of breast cancer 01/22/2012  . Seasonal and perennial allergic rhinitis 07/18/2010  . HAIR LOSS 07/04/2010  . ANXIETY DISORDER, SITUATIONAL, MILD 11/17/2009  . CHEST PAIN, LEFT 10/12/2009  . DYSURIA 08/26/2009  . MAMMOGRAM, ABNORMAL, RIGHT, HX OF  04/09/2009  . TOBACCO ABUSE 03/17/2009  . DECREASED HEARING, RIGHT EAR 10/27/2008  . BACK PAIN, LUMBAR 06/06/2007  . HOT FLASHES 07/18/2006    Past Surgical History:  Procedure Laterality Date  . ABDOMINAL HYSTERECTOMY    . BREAST LUMPECTOMY WITH RADIOACTIVE SEED AND SENTINEL LYMPH NODE BIOPSY Right 07/24/2017   Procedure: RIGHT BREAST LUMPECTOMY WITH RADIOACTIVE SEED AND SENTINEL LYMPH NODE BIOPSY;  Surgeon: Erroll Luna, MD;  Location: [facility];  Service: General;  Laterality: Right;  . PORTACATH PLACEMENT Right 07/24/2017   Procedure: INSERTION PORT-A-CATH;  Surgeon: Erroll Luna, MD;  Location: [facility];  Service: General;  Laterality: Right;  . TUBAL LIGATION  1993  . VAGINAL HYSTERECTOMY Bilateral 12/19/2016   Procedure: HYSTERECTOMY VAGINAL;  Surgeon: Lavonia Drafts, MD;  Location: [facility];  Service: Gynecology;  Laterality: Bilateral;  . WISDOM TOOTH EXTRACTION      OB History    Gravida Para Term Preterm AB Living   2 2 1 1   2    SAB TAB Ectopic Multiple Live Births                   Home Medications    Prior to Admission medications   Medication Sig Start Date End Date Taking? Authorizing Provider  albuterol (PROVENTIL HFA;VENTOLIN HFA) 108 (90 Base) MCG/ACT inhaler Inhale 2 puffs into the lungs every 6 (six) hours as needed for wheezing or shortness of breath.    [provider]  diphenoxylate-atropine (  LOMOTIL) 2.5-0.025 MG tablet TAKE 1 TABLET BY MOUTH 4 TIMES DAILY AS NEEDED FOR DIARRHEA OR LOOSE STOOLS 11/13/17   Nicholas Lose, MD  famotidine (PEPCID) 20 MG tablet Take 20 mg by mouth 2 (two) times daily. 11/13/17   [provider]  ibuprofen (ADVIL,MOTRIN) 600 MG tablet Take 1 tablet (600 mg total) by mouth every 6 (six) hours as needed. 11/17/17   Noemi Chapel, MD  ipratropium (ATROVENT) 0.03 % nasal spray SPRAY TWO SQUIRTS INTO EACH NOSTRIL TWICE A DAY 07/09/17   [provider]    lidocaine-prilocaine (EMLA) cream Apply to affected area once 11/26/17   Nicholas Lose, MD  pantoprazole (PROTONIX) 40 MG tablet Take 40 mg by mouth daily.    [provider]  ranitidine (ZANTAC) 150 MG tablet Take 150 mg by mouth daily. 10/10/17   [provider]  sulfamethoxazole-trimethoprim (BACTRIM DS,SEPTRA DS) 800-160 MG tablet Take 1 tablet by mouth 2 (two) times daily for 3 days. 12/04/17 12/07/17  Ok Edwards, PA-C  diltiazem (CARDIZEM SR) 120 MG 12 hr capsule Take 1 capsule (120 mg total) by mouth daily. 05/15/11 12/17/11  Fay Records, MD  prochlorperazine (COMPAZINE) 10 MG tablet Take 1 tablet (10 mg total) every 6 (six) hours as needed by mouth (Nausea or vomiting). 07/31/17 11/26/17  Nicholas Lose, MD    Family History Family History  Problem Relation Age of Onset  . Migraines Daughter   . Hypertension Mother   . Heart disease Mother   . Hypertension Father   . Heart disease Father   . Breast cancer Sister 20       deceased at age 63  . Migraines Son   . Hypertension Unknown   . Colon cancer Neg Hx   . Colon polyps Neg Hx   . Diabetes Neg Hx   . Kidney disease Neg Hx   . Liver disease Neg Hx   . Heart attack Neg Hx   . Stroke Neg Hx     Social History Social History   Tobacco Use  . Smoking status: Former Smoker    Packs/day: 0.50    Types: Cigarettes    Last attempt to quit: 07/16/2017    Years since quitting: 0.3  . Smokeless tobacco: Never Used  . Tobacco comment: smokes 1cigarette per day and more if she is stressed  Substance Use Topics  . Alcohol use: No    Alcohol/week: 0.0 oz  . Drug use: Yes    Types: Marijuana    Comment: occasional, for pain. states she hasnt used for a mont; 08/02/17 sometimes     Allergies   Morphine; Shellfish allergy; Penicillins; Codeine; Hydrocodone; and Tomato   Review of Systems Review of Systems  Reason unable to perform ROS: See HPI as above.     Physical Exam Triage Vital Signs ED Triage Vitals   Enc Vitals Group     BP 12/04/17 1804 127/79     Pulse Rate 12/04/17 1804 85     Resp 12/04/17 1804 16     Temp 12/04/17 1804 98.3 F (36.8 C)     Temp Source 12/04/17 1804 Oral     SpO2 12/04/17 1804 100 %     Weight 12/04/17 1803 160 lb (72.6 kg)     Height --      Head Circumference --      Peak Flow --      Pain Score 12/04/17 1803 9     Pain Loc --  Pain Edu? --      Excl. in [place]? --    No data found.  Updated Vital Signs BP 127/79   Pulse 85   Temp 98.3 F (36.8 C) (Oral)   Resp 16   Wt 160 lb (72.6 kg)   LMP 11/13/2016   SpO2 100%   BMI 26.63 kg/m   Physical Exam  Constitutional: She is oriented to person, place, and time. She appears well-developed and well-nourished. No distress.  HENT:  Head: Normocephalic and atraumatic.  Eyes: Conjunctivae are normal. Pupils are equal, round, and reactive to light.  Cardiovascular: Normal rate, regular rhythm and normal heart sounds. Exam reveals no gallop and no friction rub.  No murmur heard. Pulmonary/Chest: Effort normal and breath sounds normal. She has no wheezes. She has no rales.  Abdominal: Soft. Bowel sounds are normal. She exhibits no mass. There is no tenderness. There is no rebound, no guarding and no CVA tenderness.  Genitourinary: Vaginal discharge found.  Genitourinary Comments: History of hysterectomy, no cervix visualized.  Neurological: She is alert and oriented to person, place, and time.  Skin: Skin is warm and dry.  Psychiatric: She has a normal mood and affect. Her behavior is normal. Judgment normal.     UC Treatments / Results  Labs (all labs ordered are listed, but only abnormal results are displayed) Labs Reviewed  POCT URINALYSIS DIP (DEVICE) - Abnormal; Notable for the following components:      Result Value   Ketones, ur 15 (*)    Hgb urine dipstick MODERATE (*)    Protein, ur 100 (*)    Leukocytes, UA MODERATE (*)    All other components within normal limits  URINE CULTURE   CERVICOVAGINAL ANCILLARY ONLY    EKG  EKG Interpretation None       Radiology No results found.  Procedures Procedures (including critical care time)  Medications Ordered in UC Medications - No data to display   Initial Impression / Assessment and Plan / UC Course  I have reviewed the triage vital signs and the nursing notes.  Pertinent labs & imaging results that were available during my care of the patient were reviewed by me and considered in my medical decision making (see chart for details).    Urine dipstick positive for UTI. Start antibiotics as directed. Patient with large hgb in dipstick, had recent CT without signs of kidney stones, without flank pain/CVA tenderness. Cytology sent, patient will be contacted with any positive results that require additional treatment. Patient to refrain from sexual activity for the next 7 days. Return precautions given.   Final Clinical Impressions(s) / UC Diagnoses   Final diagnoses:  Cystitis    ED Discharge Orders        Ordered    sulfamethoxazole-trimethoprim (BACTRIM DS,SEPTRA DS) 800-160 MG tablet  2 times daily     12/04/17 1849        Ok Edwards, PA-C 12/04/17 1907

## 2017-12-04 NOTE — ED Triage Notes (Signed)
PT reports urinary frequency, burning, and one episode of incontinence today. PT last got chemo 3/4 for breast cancer.

## 2017-12-05 LAB — CERVICOVAGINAL ANCILLARY ONLY
Bacterial vaginitis: NEGATIVE
CHLAMYDIA, DNA PROBE: NEGATIVE
Candida vaginitis: NEGATIVE
NEISSERIA GONORRHEA: NEGATIVE
Trichomonas: NEGATIVE

## 2017-12-05 NOTE — Progress Notes (Signed)
Location of Breast Cancer: Right Breast  Histology per Pathology Report:  06/26/17 Diagnosis 1. Breast, right, needle core biopsy, 8:30 o'clock, 8cmfn - INVASIVE DUCTAL CARCINOMA. - DUCTAL CARCINOMA IN SITU. - SEE COMMENT. 2. Breast, right, needle core biopsy, 8:30 o'clock, 6cmfn - FIBROCYSTIC CHANGES. - THERE IS NO EVIDENCE OF MALIGNANCY  Receptor Status: ER(NEG), PR (NEG), Her2-neu (POS), Ki-(40%)  07/20/17 Diagnosis Breast, left, needle core biopsy, 4:00 o'clock - FIBROADENOMA - NO MALIGNANCY IDENTIFIED  07/24/17 Diagnosis 1. 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 - INVASIVE CARCINOMA IS BROADLY LESS THAN 0.1 CM TO THE POSTERIOR MARGIN OF SPECIMEN 1. - SEE ONCOLOGY TABLE BELOW. 2. Breast, excision, Right posterior margin - BENIGN BREAST PARENCHYMA. - THERE IS NO EVIDENCE OF MALIGNANCY - SEE COMMENT. 3. Breast, excision, Right anterior margin - FIBROCYSTIC CHANGES. - THERE IS NO EVIDENCE OF MALIGNANCY. - SEE COMMENT. 4. Lymph node, sentinel, biopsy, Right axillary - THERE IS NO EVIDENCE OF CARCINOMA IN 1 OF 1 LYMPH NODE (0/1). 5. Lymph node, sentinel, biopsy, Right axillary - THERE IS NO EVIDENCE OF CARCINOMA IN 1 OF 1 LYMPH NODE (0/1).  Did patient present with symptoms or was this found on screening mammography?: She self palpated the mass and brought to the attention of her physician.   Past/Anticipated interventions by surgeon, if any: 07/24/17 Procedure: Right breast seed localized partial mastectomy with right axillary deep sentinel lymph node mapping and placement of right internal jugular 8 French port with ultrasound and C-arm guidance Surgeon: Erroll Luna MD   Past/Anticipated interventions by medical oncology, if any:  11/20/17 Dr. Lindi Adie Patient will complete chemotherapy next week. I will make sure she has an appointment with radiation oncology. I will see her with every other Herceptin treatment  with labs   Lymphedema issues, if any:  She denies. She has good arm mobility  Pain issues, if any: She denies  SAFETY ISSUES:  Prior radiation? No  Pacemaker/ICD? No  Possible current pregnancy? No, hysterectomy 12/19/16  Is the patient on methotrexate? No  Current Complaints / other details:   BP (!) 129/95   Pulse 64   Temp 98.2 F (36.8 C)   Ht '5\' 5"'  (1.651 m)   Wt 162 lb 6.4 oz (73.7 kg)   LMP 11/13/2016   SpO2 100% Comment: room air  BMI 27.02 kg/m    Wt Readings from Last 3 Encounters:  12/11/17 162 lb 6.4 oz (73.7 kg)  12/04/17 160 lb (72.6 kg)  11/20/17 159 lb (72.1 kg)

## 2017-12-07 LAB — URINE CULTURE: Culture: >=100000 — AB

## 2017-12-10 ENCOUNTER — Telehealth (HOSPITAL_COMMUNITY): Payer: Self-pay | Admitting: *Deleted

## 2017-12-10 NOTE — Telephone Encounter (Signed)
Pt called to inform of test results from recent visit. Pt verbalized understanding to complete antibiotic and to return for worsening of symptoms.

## 2017-12-11 ENCOUNTER — Other Ambulatory Visit: Payer: Self-pay

## 2017-12-11 ENCOUNTER — Ambulatory Visit
Admission: RE | Admit: 2017-12-11 | Discharge: 2017-12-11 | Disposition: A | Discharge status: Home / Self Care | Payer: Medicaid Other | Source: Ambulatory Visit | Attending: Radiation Oncology | Admitting: Radiation Oncology

## 2017-12-11 ENCOUNTER — Encounter: Payer: Self-pay | Admitting: Radiation Oncology

## 2017-12-11 VITALS — BP 129/95 | HR 64 | Temp 98.2°F | Ht 65.0 in | Wt 162.4 lb

## 2017-12-11 DIAGNOSIS — Z9221 Personal history of antineoplastic chemotherapy: Secondary | ICD-10-CM | POA: Insufficient documentation

## 2017-12-11 DIAGNOSIS — R109 Unspecified abdominal pain: Secondary | ICD-10-CM | POA: Diagnosis not present

## 2017-12-11 DIAGNOSIS — Z90710 Acquired absence of both cervix and uterus: Secondary | ICD-10-CM | POA: Insufficient documentation

## 2017-12-11 DIAGNOSIS — Z91013 Allergy to seafood: Secondary | ICD-10-CM | POA: Insufficient documentation

## 2017-12-11 DIAGNOSIS — C50411 Malignant neoplasm of upper-outer quadrant of right female breast: Secondary | ICD-10-CM | POA: Insufficient documentation

## 2017-12-11 DIAGNOSIS — Z171 Estrogen receptor negative status [ER-]: Secondary | ICD-10-CM | POA: Insufficient documentation

## 2017-12-11 DIAGNOSIS — Z79899 Other long term (current) drug therapy: Secondary | ICD-10-CM | POA: Insufficient documentation

## 2017-12-11 DIAGNOSIS — Z880 Allergy status to penicillin: Secondary | ICD-10-CM | POA: Diagnosis not present

## 2017-12-11 DIAGNOSIS — Z510 Encounter for antineoplastic radiation therapy: Secondary | ICD-10-CM | POA: Diagnosis present

## 2017-12-11 DIAGNOSIS — Z885 Allergy status to narcotic agent status: Secondary | ICD-10-CM | POA: Diagnosis not present

## 2017-12-11 NOTE — Progress Notes (Signed)
  Radiation Oncology         [phone] ________________________________  Name: Katherine [name] MRN: [id number]  Date: 12/11/2017  DOB: July 02, 1971  SIMULATION AND TREATMENT PLANNING NOTE    Outpatient  DIAGNOSIS:     ICD-10-CM   1. Malignant neoplasm of upper-outer quadrant of right breast in female, estrogen receptor negative ([place]) C50.411    Z17.1     NARRATIVE:  The patient was brought to the [facility].  Identity was confirmed.  All relevant records and images related to the planned course of therapy were reviewed.  The patient freely provided informed written consent to proceed with treatment after reviewing the details related to the planned course of therapy. The consent form was witnessed and verified by the simulation staff.    Then, the patient was set-up in a stable reproducible supine position for radiation therapy with her ipsilateral arm over her head, and her upper body secured in a custom-made Vac-lok device.  CT images were obtained.  Surface markings were placed.  The CT images were loaded into the planning software.    TREATMENT PLANNING NOTE: Treatment planning then occurred.  The radiation prescription was entered and confirmed.     A total of 3 medically necessary complex treatment devices were fabricated and supervised by me: 2 fields with MLCs for custom blocks to protect heart, and lungs;  and, a Vac-lok. MORE COMPLEX DEVICES MAY BE MADE IN DOSIMETRY FOR FIELD IN FIELD BEAMS FOR DOSE HOMOGENEITY.  I have requested : 3D Simulation which is medically necessary to give adequate dose to at risk tissues while sparing lungs and heart.  I have requested a DVH of the following structures: lungs, heart, lumpectomy.    The patient will receive 40.05 Gy in 15 fractions to the right breast with 2 tangential fields.   This will be followed by a boost.  Optical Surface Tracking Plan:  Since intensity modulated radiotherapy (IMRT) and 3D conformal radiation  treatment methods are predicated on accurate and precise positioning for treatment, intrafraction motion monitoring is medically necessary to ensure accurate and safe treatment delivery. The ability to quantify intrafraction motion without excessive ionizing radiation dose can only be performed with optical surface tracking. Accordingly, surface imaging offers the opportunity to obtain 3D measurements of patient position throughout IMRT and 3D treatments without excessive radiation exposure. I am ordering optical surface tracking for this patient's upcoming course of radiotherapy.  ________________________________   Reference:  Ursula Alert, J, et al. Surface imaging-based analysis of intrafraction motion for breast radiotherapy patients.Journal of [place], n. 6, nov. 2014. ISSN [id number].  Available at: <http://www.jacmp.org/index.php/jacmp/article/view/4957>.    -----------------------------------  Eppie Gibson, MD

## 2017-12-11 NOTE — Progress Notes (Signed)
Radiation Oncology         [phone] ________________________________  Name: Katherine [name] MRN: [id number]  Date: 12/11/2017  DOB: 1971/01/21  Follow-Up Visit Note  Outpatient  CC: Pavelock, Ralene Bathe, MD  Nicholas Lose, MD  Diagnosis:      ICD-10-CM   1. Malignant neoplasm of upper-outer quadrant of right breast in female, estrogen receptor negative ([place]) C50.411    Z17.1     Malignant neoplasm of upper-outer quadrant of right breast in female, estrogen receptor negative ([place]) Staging form: Breast, AJCC 8th Edition - Clinical: Stage IA (cT1c, cN0, cM0, G3, ER: Negative, PR: Negative, HER2: Positive) - Signed by Gardenia Phlegm, NP on 07/04/2017  Stage IA pT1cN0, T1cN0M0 Right Breast UOQ Invasive Ductal Carcinoma, ER(-) / PR(-) / Ki-67 40%, HER-2 positive ratio 3.59, Grade 3  CHIEF COMPLAINT: Here to discuss management of her right breast cancer  Narrative:  The patient returns today for follow-up.     Since consultation, she underwent right breast lumpectomy on 07/24/17 by Dr. Brantley Stage. Pathology returned invasive ductal carcinoma, Grade III, spanning 1.8 cm. Carcinoma came within 0.1 cm of closest margin (posterior). 0/2 lymph nodes were positive for malignancy.  The patient has since completed chemotherapy under the supervision of Dr. Lindi Adie as of two weeks ago. She has been kindly referred back to radiation oncology.   On review of systems, the patient denies lymphedema and endorses good arm mobility. She denies any pain. Endorses hot flashes since beginning chemotherapy. S/p hysterectomy.  Of note, patient underwent abdominal CT on 09/21/17 to investigate reported flank pain but no acute or focal lesion was found. Scan repeated on 11/17/17 with same results.           ALLERGIES:  is allergic to morphine; shellfish allergy; penicillins; codeine; hydrocodone; and tomato.  Meds: Current Outpatient Medications  Medication Sig Dispense Refill  . albuterol (PROVENTIL  HFA;VENTOLIN HFA) 108 (90 Base) MCG/ACT inhaler Inhale 2 puffs into the lungs every 6 (six) hours as needed for wheezing or shortness of breath.    . diphenoxylate-atropine (LOMOTIL) 2.5-0.025 MG tablet TAKE 1 TABLET BY MOUTH 4 TIMES DAILY AS NEEDED FOR DIARRHEA OR LOOSE STOOLS 30 tablet 0  . famotidine (PEPCID) 20 MG tablet Take 20 mg by mouth 2 (two) times daily.  1  . ibuprofen (ADVIL,MOTRIN) 600 MG tablet Take 1 tablet (600 mg total) by mouth every 6 (six) hours as needed. 30 tablet 0  . ipratropium (ATROVENT) 0.03 % nasal spray SPRAY TWO SQUIRTS INTO EACH NOSTRIL TWICE A DAY  1  . lidocaine-prilocaine (EMLA) cream Apply to affected area once 30 g 3  . pantoprazole (PROTONIX) 40 MG tablet Take 40 mg by mouth daily.    . traMADol (ULTRAM) 50 MG tablet Take 50 mg by mouth every 6 (six) hours as needed. for pain  0   No current facility-administered medications for this encounter.     Physical Findings:  height is '5\' 5"'$  (1.651 m) and weight is 162 lb 6.4 oz (73.7 kg). Her temperature is 98.2 F (36.8 C). Her blood pressure is 129/95 (abnormal) and her pulse is 64. Her oxygen saturation is 100%. .     General: Alert and oriented, in no acute distress Psychiatric: Judgment and insight are intact. Affect is appropriate. Breast exam reveals her surgical scars have healed well over the right breast.  Lab Findings: Lab Results  Component Value Date   WBC 6.0 11/26/2017   HGB 10.5 (L) 11/26/2017  HCT 33.3 (L) 11/26/2017   MCV 85.6 11/26/2017   PLT 304 11/26/2017      Radiographic Findings: Ct Abdomen Pelvis W Contrast  Result Date: 11/17/2017 CLINICAL DATA:  Right flank pain the lower abdomen since Monday. EXAM: CT ABDOMEN AND PELVIS WITH CONTRAST TECHNIQUE: Multidetector CT imaging of the abdomen and pelvis was performed using the standard protocol following bolus administration of intravenous contrast. CONTRAST:  164m ISOVUE-300 IOPAMIDOL (ISOVUE-300) INJECTION 61% COMPARISON:  CT  abdomen and pelvis 09/21/2017 FINDINGS: Lower chest: Minimal atelectasis present. Lung bases are otherwise clear. Heart size is normal. Hepatobiliary: No focal liver abnormality is seen. No gallstones, gallbladder wall thickening, or biliary dilatation. Pancreas: Unremarkable. No pancreatic ductal dilatation or surrounding inflammatory changes. Spleen: Normal in size without focal abnormality. Adrenals/Urinary Tract: Adrenal glands are normal. A simple cyst in the right kidney measures 10 mm. Kidneys and ureters are otherwise within normal limits. Stomach/Bowel: Stomach and duodenum are within normal limits. Small bowel is unremarkable. Terminal ileum is within normal limits. Appendix is visualized and normal. The ascending and transverse colon are normal. Descending and sigmoid colon are normal. Vascular/Lymphatic: No significant vascular findings are present. No enlarged abdominal or pelvic lymph nodes. Reproductive: Status post hysterectomy. No adnexal masses. Other: No abdominal wall hernia or abnormality. No abdominopelvic ascites. Musculoskeletal: Vertebral body heights alignment are normal. No focal lytic or blastic lesions are present. The pelvis is intact. Hips are located and normal bilaterally. IMPRESSION: 1. No acute or focal lesion to explain the patient's right flank and lower abdominal pain. 2. Negative CT of the abdomen and pelvis. 3. Hysterectomy. Electronically Signed   By: CSan MorelleM.D.   On: 11/17/2017 16:40    Impression/Plan: right breast cancer  We discussed adjuvant radiotherapy today.  I recommend radiation to her right breast in order to reduce her risk of locoregional recurrence by 2/3s. I anticipate 4 weeks of treatment. The risks, benefits and side effects of this treatment were discussed in detail.  She understands that radiotherapy is associated with skin irritation and fatigue in the acute setting. Late effects can include cosmetic changes and rare injury to internal  organs.   She is enthusiastic about proceeding with treatment. A consent form has been signed and placed in her chart. The patient will proceed to CT sim after appointment today and will begin treatment next week.  A total of 3 medically necessary complex treatment devices will be fabricated and supervised by me: 2 fields with MLCs for custom blocks to protect heart, and lungs;  and, a Vac-lok. MORE COMPLEX DEVICES MAY BE MADE IN DOSIMETRY FOR FIELD IN FIELD BEAMS FOR DOSE HOMOGENEITY.  I have requested : 3D Simulation which is medically necessary to give adequate dose to at risk tissues while sparing lungs and heart.  I have requested a DVH of the following structures: lungs, heart, lumpectomy cavity.    The patient will receive 40.05 Gy in 15 fractions to the right breast with 2 fields.  This will be followed by a boost.  I spent at least 15 minutes face to face with the patient and more than 50% of that time was spent in counseling and/or coordination of care.    _____________________________________   SEppie Gibson MD  This document serves as a record of services personally performed by SEppie Gibson MD. It was created on his behalf by JLinward Natal a trained medical scribe. The creation of this record is based on the scribe's personal observations and the provider's statements  to them. This document has been checked and approved by the attending provider.

## 2017-12-14 ENCOUNTER — Encounter: Payer: Self-pay | Admitting: *Deleted

## 2017-12-14 DIAGNOSIS — Z510 Encounter for antineoplastic radiation therapy: Secondary | ICD-10-CM | POA: Diagnosis not present

## 2017-12-17 ENCOUNTER — Inpatient Hospital Stay: Payer: Medicaid Other

## 2017-12-17 ENCOUNTER — Other Ambulatory Visit: Payer: Self-pay

## 2017-12-17 ENCOUNTER — Inpatient Hospital Stay (HOSPITAL_BASED_OUTPATIENT_CLINIC_OR_DEPARTMENT_OTHER): Payer: Medicaid Other | Admitting: Hematology and Oncology

## 2017-12-17 ENCOUNTER — Other Ambulatory Visit: Payer: Self-pay | Admitting: *Deleted

## 2017-12-17 DIAGNOSIS — Z79899 Other long term (current) drug therapy: Secondary | ICD-10-CM

## 2017-12-17 DIAGNOSIS — G620 Drug-induced polyneuropathy: Secondary | ICD-10-CM | POA: Diagnosis not present

## 2017-12-17 DIAGNOSIS — Z171 Estrogen receptor negative status [ER-]: Principal | ICD-10-CM

## 2017-12-17 DIAGNOSIS — T451X5S Adverse effect of antineoplastic and immunosuppressive drugs, sequela: Secondary | ICD-10-CM | POA: Diagnosis not present

## 2017-12-17 DIAGNOSIS — C50411 Malignant neoplasm of upper-outer quadrant of right female breast: Secondary | ICD-10-CM

## 2017-12-17 DIAGNOSIS — Z5111 Encounter for antineoplastic chemotherapy: Secondary | ICD-10-CM | POA: Diagnosis not present

## 2017-12-17 DIAGNOSIS — Z95828 Presence of other vascular implants and grafts: Secondary | ICD-10-CM

## 2017-12-17 DIAGNOSIS — R232 Flushing: Secondary | ICD-10-CM | POA: Diagnosis not present

## 2017-12-17 DIAGNOSIS — T451X5A Adverse effect of antineoplastic and immunosuppressive drugs, initial encounter: Secondary | ICD-10-CM

## 2017-12-17 LAB — CMP (CANCER CENTER ONLY)
ALK PHOS: 89 U/L (ref 40–150)
ALT: 18 U/L (ref 0–55)
AST: 14 U/L (ref 5–34)
Albumin: 3.7 g/dL (ref 3.5–5.0)
Anion gap: 10 (ref 3–11)
BUN: 9 mg/dL (ref 7–26)
CALCIUM: 9.4 mg/dL (ref 8.4–10.4)
CHLORIDE: 107 mmol/L (ref 98–109)
CO2: 24 mmol/L (ref 22–29)
CREATININE: 0.62 mg/dL (ref 0.60–1.10)
GFR, Estimated: >60 mL/min (ref >60)
Glucose, Bld: 109 mg/dL (ref 70–140)
Potassium: 3.7 mmol/L (ref 3.5–5.1)
SODIUM: 141 mmol/L (ref 136–145)
Total Bilirubin: <0.2 mg/dL — ABNORMAL LOW (ref 0.2–1.2)
Total Protein: 6.5 g/dL (ref 6.4–8.3)

## 2017-12-17 LAB — CBC WITH DIFFERENTIAL (CANCER CENTER ONLY)
BASOS PCT: 0 %
Basophils Absolute: 0 10*3/uL (ref 0.0–0.1)
EOS ABS: 0.2 10*3/uL (ref 0.0–0.5)
EOS PCT: 2 %
HCT: 32.7 % — ABNORMAL LOW (ref 34.8–46.6)
HEMOGLOBIN: 10.4 g/dL — AB (ref 11.6–15.9)
LYMPHS ABS: 2.4 10*3/uL (ref 0.9–3.3)
Lymphocytes Relative: 35 %
MCH: 27 pg (ref 25.1–34.0)
MCHC: 31.8 g/dL (ref 31.5–36.0)
MCV: 84.9 fL (ref 79.5–101.0)
MONO ABS: 0.5 10*3/uL (ref 0.1–0.9)
MONOS PCT: 8 %
NEUTROS PCT: 55 %
Neutro Abs: 3.9 10*3/uL (ref 1.5–6.5)
PLATELETS: 215 10*3/uL (ref 145–400)
RBC: 3.85 MIL/uL (ref 3.70–5.45)
RDW: 15.9 % — ABNORMAL HIGH (ref 11.2–14.5)
WBC Count: 7 10*3/uL (ref 3.9–10.3)

## 2017-12-17 MED ORDER — HEPARIN SOD (PORK) LOCK FLUSH 100 UNIT/ML IV SOLN
500.0000 [IU] | Freq: Once | INTRAVENOUS | Status: AC | PRN
  Administered 2017-12-17: 500 [IU]
  Filled 2017-12-17: qty 5

## 2017-12-17 MED ORDER — SODIUM CHLORIDE 0.9 % IV SOLN
Freq: Once | INTRAVENOUS | Status: AC
  Administered 2017-12-17: 09:00:00 via INTRAVENOUS

## 2017-12-17 MED ORDER — ACETAMINOPHEN 325 MG PO TABS
650.0000 mg | ORAL_TABLET | Freq: Once | ORAL | Status: AC
  Administered 2017-12-17: 650 mg via ORAL

## 2017-12-17 MED ORDER — DIPHENHYDRAMINE HCL 25 MG PO CAPS
ORAL_CAPSULE | ORAL | Status: AC
  Filled 2017-12-17: qty 2

## 2017-12-17 MED ORDER — SODIUM CHLORIDE 0.9% FLUSH
10.0000 mL | INTRAVENOUS | Status: DC | PRN
Start: 2017-12-17 — End: 2017-12-17
  Administered 2017-12-17: 10 mL
  Filled 2017-12-17: qty 10

## 2017-12-17 MED ORDER — ACETAMINOPHEN 325 MG PO TABS
ORAL_TABLET | ORAL | Status: AC
  Filled 2017-12-17: qty 2

## 2017-12-17 MED ORDER — DIPHENHYDRAMINE HCL 25 MG PO CAPS
50.0000 mg | ORAL_CAPSULE | Freq: Once | ORAL | Status: AC
  Administered 2017-12-17: 50 mg via ORAL

## 2017-12-17 MED ORDER — SODIUM CHLORIDE 0.9% FLUSH
10.0000 mL | INTRAVENOUS | Status: DC | PRN
  Administered 2017-12-17: 10 mL via INTRAVENOUS
  Filled 2017-12-17: qty 10

## 2017-12-17 MED ORDER — TRASTUZUMAB CHEMO 150 MG IV SOLR
450.0000 mg | Freq: Once | INTRAVENOUS | Status: AC
  Administered 2017-12-17: 450 mg via INTRAVENOUS
  Filled 2017-12-17: qty 21.43

## 2017-12-17 MED ORDER — VENLAFAXINE HCL ER 37.5 MG PO CP24: 37.5000 mg | ORAL_CAPSULE | Freq: Every day | ORAL | 3 refills | Status: DC

## 2017-12-17 MED FILL — VENLAFAXINE HCL ER 37.5 MG: 37.5 | 30 days supply | Qty: 30 | Fill #0

## 2017-12-17 NOTE — Patient Instructions (Signed)
[facility] Discharge Instructions for Patients Receiving Chemotherapy  Today you received the following chemotherapy agent: Herceptin (Trastuzumab)  To help prevent nausea and vomiting after your treatment, we encourage you to take your nausea medication as directed  If you develop nausea and vomiting that is not controlled by your nausea medication, call the clinic.   BELOW ARE SYMPTOMS THAT SHOULD BE REPORTED IMMEDIATELY:  *FEVER GREATER THAN 100.5 F  *CHILLS WITH OR WITHOUT FEVER  NAUSEA AND VOMITING THAT IS NOT CONTROLLED WITH YOUR NAUSEA MEDICATION  *UNUSUAL SHORTNESS OF BREATH  *UNUSUAL BRUISING OR BLEEDING  TENDERNESS IN MOUTH AND THROAT WITH OR WITHOUT PRESENCE OF ULCERS  *URINARY PROBLEMS  *BOWEL PROBLEMS  UNUSUAL RASH Items with * indicate a potential emergency and should be followed up as soon as possible.  Feel free to call the clinic should you have any questions or concerns. The clinic phone number is [phone].  Please show the [place] at check-in to the Emergency Department and triage nurse.     Trastuzumab injection for infusion What is this medicine? TRASTUZUMAB (tras TOO zoo mab) is a monoclonal antibody. It is used to treat breast cancer and stomach cancer. This medicine may be used for other purposes; ask your health care provider or pharmacist if you have questions. COMMON BRAND NAME(S): Herceptin What should I tell my health care provider before I take this medicine? They need to know if you have any of these conditions: -heart disease -heart failure -lung or breathing disease, like asthma -an unusual or allergic reaction to trastuzumab, benzyl alcohol, or other medications, foods, dyes, or preservatives -pregnant or trying to get pregnant -breast-feeding How should I use this medicine? This drug is given as an infusion into a vein. It is administered in a hospital or clinic by a specially trained health care  professional. Talk to your pediatrician regarding the use of this medicine in children. This medicine is not approved for use in children. Overdosage: If you think you have taken too much of this medicine contact a poison control center or emergency room at once. NOTE: This medicine is only for you. Do not share this medicine with others. What if I miss a dose? It is important not to miss a dose. Call your doctor or health care professional if you are unable to keep an appointment. What may interact with this medicine? This medicine may interact with the following medications: -certain types of chemotherapy, such as daunorubicin, doxorubicin, epirubicin, and idarubicin This list may not describe all possible interactions. Give your health care provider a list of all the medicines, herbs, non-prescription drugs, or dietary supplements you use. Also tell them if you smoke, drink alcohol, or use illegal drugs. Some items may interact with your medicine. What should I watch for while using this medicine? Visit your doctor for checks on your progress. Report any side effects. Continue your course of treatment even though you feel ill unless your doctor tells you to stop. Call your doctor or health care professional for advice if you get a fever, chills or sore throat, or other symptoms of a cold or flu. Do not treat yourself. Try to avoid being around people who are sick. You may experience fever, chills and shaking during your first infusion. These effects are usually mild and can be treated with other medicines. Report any side effects during the infusion to your health care professional. Fever and chills usually do not happen with later infusions. Do not  become pregnant while taking this medicine or for 7 months after stopping it. Women should inform their doctor if they wish to become pregnant or think they might be pregnant. Women of child-bearing potential will need to have a negative pregnancy test  before starting this medicine. There is a potential for serious side effects to an unborn child. Talk to your health care professional or pharmacist for more information. Do not breast-feed an infant while taking this medicine or for 7 months after stopping it. Women must use effective birth control with this medicine. What side effects may I notice from receiving this medicine? Side effects that you should report to your doctor or health care professional as soon as possible: -allergic reactions like skin rash, itching or hives, swelling of the face, lips, or tongue -chest pain or palpitations -cough -dizziness -feeling faint or lightheaded, falls -fever -general ill feeling or flu-like symptoms -signs of worsening heart failure like breathing problems; swelling in your legs and feet -unusually weak or tired Side effects that usually do not require medical attention (report to your doctor or health care professional if they continue or are bothersome): -bone pain -changes in taste -diarrhea -joint pain -nausea/vomiting -weight loss This list may not describe all possible side effects. Call your doctor for medical advice about side effects. You may report side effects to FDA at 1-800-FDA-1088. Where should I keep my medicine? This drug is given in a hospital or clinic and will not be stored at home. NOTE: This sheet is a summary. It may not cover all possible information. If you have questions about this medicine, talk to your doctor, pharmacist, or health care provider.  2018 Elsevier/Gold Standard (2016-09-05 14:37:52)

## 2017-12-17 NOTE — Assessment & Plan Note (Signed)
07/24/2017:Right lumpectomy: IDC grade 3, 1.8 cm, 0/2 lymph nodes negative,ER 0%, PR 0%, Ki-67 40%, HER-2 positive ratio 3.59, T1CN0 stage I a  Recommendation: 1. Adjuvant chemotherapy with Taxol Herceptin completed 11/26/2017 followed by Herceptin maintenance for 1 year 2. followed by adjuvant radiation therapy ------------------------------------------------------------------------------------------------------------------------------------ Current treatment: Herceptin maintenance therapy. Patient is getting ready to start adjuvant radiation therapy.  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

## 2017-12-17 NOTE — Progress Notes (Signed)
 Patient Care Team: Pavelock, Richard M, MD as PCP - General (Internal Medicine) Cornett, Thomas, MD as Consulting Physician (General Surgery)  DIAGNOSIS:  Encounter Diagnoses  Name Primary?  . Malignant neoplasm of upper-outer quadrant of right breast in female, estrogen receptor negative (HCC)   . Chemotherapy-induced peripheral neuropathy (HCC)     SUMMARY OF ONCOLOGIC HISTORY:   Malignant neoplasm of upper-outer quadrant of right breast in female, estrogen receptor negative (HCC)   06/26/2017 Initial Diagnosis    Right breast biopsy 8:30 position 8 cm from nipple: IDC with DCIS, second biopsy 6 cm from nipple fibrocystic changes: Grade 3, ER 0%, PR 0%, Ki-67 40%, HER-2 positive ratio 3.59; mammogram and ultrasound revealed 1.9 cm mass at 8:30 position, adjacent 0.7 cm cystic mass, T1c N0 stage IA AJCC 8       07/24/2017 Surgery    Right lumpectomy: IDC grade 3, 1.8 cm, 0/2 lymph nodes negative,ER 0%, PR 0%, Ki-67 40%, HER-2 positive ratio 3.59, T1CN0 stage I a       09/10/2017 - 11/26/2017 Chemotherapy    Taxol Herceptin weekly x12 followed by Herceptin maintenance for 1 year       CHIEF COMPLIANT: Follow-up on Herceptin  INTERVAL HISTORY: Katherine [name] is a 46-year-old with above-mentioned history of right breast cancer treated with lumpectomy followed by adjuvant chemotherapy with Taxol Herceptin.  She completed the chemo apart and she is currently on Herceptin maintenance every 3 weeks.  She appears to be doing quite well.  Her biggest complaints are related to severe hot flashes which keep her up at night.  She also has a neuropathy in her hands and feet.  REVIEW OF SYSTEMS:   Constitutional: Denies fevers, chills or abnormal weight loss Eyes: Denies blurriness of vision Ears, nose, mouth, throat, and face: Denies mucositis or sore throat Respiratory: Denies cough, dyspnea or wheezes Cardiovascular: Denies palpitation, chest discomfort Gastrointestinal:  Denies nausea,  heartburn or change in bowel habits Skin: Denies abnormal skin rashes Lymphatics: Denies new lymphadenopathy or easy bruising Neurological: Neuropathy of the tips of the fingers and toes as well as the heel Behavioral/Psych: Mood is stable, no new changes  Extremities: No lower extremity edema All other systems were reviewed with the patient and are negative.  I have reviewed the past medical history, past surgical history, social history and family history with the patient and they are unchanged from previous note.  ALLERGIES:  is allergic to morphine; shellfish allergy; penicillins; codeine; hydrocodone; and tomato.  MEDICATIONS:  Current Outpatient Medications  Medication Sig Dispense Refill  . albuterol (PROVENTIL HFA;VENTOLIN HFA) 108 (90 Base) MCG/ACT inhaler Inhale 2 puffs into the lungs every 6 (six) hours as needed for wheezing or shortness of breath.    . diphenoxylate-atropine (LOMOTIL) 2.5-0.025 MG tablet TAKE 1 TABLET BY MOUTH 4 TIMES DAILY AS NEEDED FOR DIARRHEA OR LOOSE STOOLS 30 tablet 0  . famotidine (PEPCID) 20 MG tablet Take 20 mg by mouth 2 (two) times daily.  1  . ibuprofen (ADVIL,MOTRIN) 600 MG tablet Take 1 tablet (600 mg total) by mouth every 6 (six) hours as needed. 30 tablet 0  . ipratropium (ATROVENT) 0.03 % nasal spray SPRAY TWO SQUIRTS INTO EACH NOSTRIL TWICE A DAY  1  . lidocaine-prilocaine (EMLA) cream Apply to affected area once 30 g 3  . pantoprazole (PROTONIX) 40 MG tablet Take 40 mg by mouth daily.    . traMADol (ULTRAM) 50 MG tablet Take 50 mg by mouth every 6 (six) hours   as needed. for pain  0   No current facility-administered medications for this visit.     PHYSICAL EXAMINATION: ECOG PERFORMANCE STATUS: 1 - Symptomatic but completely ambulatory  Vitals:   12/17/17 0836  BP: 130/88  Pulse: 70  Resp: 17  Temp: 97.7 F (36.5 C)  SpO2: 100%   Filed Weights   12/17/17 0836  Weight: 165 lb 1.6 oz (74.9 kg)    GENERAL:alert, no distress and  comfortable SKIN: skin color, texture, turgor are normal, no rashes or significant lesions EYES: normal, Conjunctiva are pink and non-injected, sclera clear OROPHARYNX:no exudate, no erythema and lips, buccal mucosa, and tongue normal  NECK: supple, thyroid normal size, non-tender, without nodularity LYMPH:  no palpable lymphadenopathy in the cervical, axillary or inguinal LUNGS: clear to auscultation and percussion with normal breathing effort HEART: regular rate & rhythm and no murmurs and no lower extremity edema ABDOMEN:abdomen soft, non-tender and normal bowel sounds MUSCULOSKELETAL:no cyanosis of digits and no clubbing  NEURO: alert & oriented x 3 with fluent speech, no focal motor/sensory deficits EXTREMITIES: No lower extremity edema  LABORATORY DATA:  I have reviewed the data as listed CMP Latest Ref Rng & Units 11/26/2017 11/20/2017 11/17/2017  Glucose 70 - 140 mg/dL 119 99 134(H)  BUN 7 - 26 mg/dL 9 7 9  Creatinine 0.60 - 1.10 mg/dL 0.71 0.65 0.60  Sodium 136 - 145 mmol/L 141 141 134(L)  Potassium 3.5 - 5.1 mmol/L 3.7 3.9 3.9  Chloride 98 - 109 mmol/L 107 107 100(L)  CO2 22 - 29 mmol/L 24 24 22  Calcium 8.4 - 10.4 mg/dL 9.5 9.4 9.0  Total Protein 6.4 - 8.3 g/dL 6.7 7.0 6.8  Total Bilirubin 0.2 - 1.2 mg/dL 0.3 <0.2(L) 0.5  Alkaline Phos 40 - 150 U/L 74 81 77  AST 5 - 34 U/L 31 12 19  ALT 0 - 55 U/L 53 18 25    Lab Results  Component Value Date   WBC 7.0 12/17/2017   HGB 10.5 (L) 11/26/2017   HCT 32.7 (L) 12/17/2017   MCV 84.9 12/17/2017   PLT 215 12/17/2017   NEUTROABS 3.9 12/17/2017    ASSESSMENT & PLAN:  Malignant neoplasm of upper-outer quadrant of right breast in female, estrogen receptor negative (HCC) 07/24/2017:Right lumpectomy: IDC grade 3, 1.8 cm, 0/2 lymph nodes negative,ER 0%, PR 0%, Ki-67 40%, HER-2 positive ratio 3.59, T1CN0 stage I a  Recommendation: 1. Adjuvant chemotherapy with Taxol Herceptin completed 11/26/2017 followed by Herceptin maintenance  for 1 year 2. followed by adjuvant radiation therapy ------------------------------------------------------------------------------------------------------------------------------------ Current treatment: Herceptin maintenance therapy. Patient is getting ready to start adjuvant radiation therapy.  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  Return to clinic every 3 weeks for Herceptin every 6 weeks for follow-up with me  I spent 25 minutes talking to the patient of which more than half was spent in counseling and coordination of care.  No orders of the defined types were placed in this encounter.  The patient has a good understanding of the overall plan. she agrees with it. she will call with any problems that may develop before the next visit here.   Viinay K Gudena, MD 12/17/17    

## 2017-12-18 ENCOUNTER — Ambulatory Visit
Admission: RE | Admit: 2017-12-18 | Discharge: 2017-12-18 | Disposition: A | Discharge status: Home / Self Care | Payer: Medicaid Other | Source: Ambulatory Visit | Attending: Radiation Oncology | Admitting: Radiation Oncology

## 2017-12-18 DIAGNOSIS — Z510 Encounter for antineoplastic radiation therapy: Secondary | ICD-10-CM | POA: Diagnosis not present

## 2017-12-19 ENCOUNTER — Ambulatory Visit
Admission: RE | Admit: 2017-12-19 | Discharge: 2017-12-19 | Disposition: A | Discharge status: Home / Self Care | Payer: Medicaid Other | Source: Ambulatory Visit | Attending: Radiation Oncology | Admitting: Radiation Oncology

## 2017-12-19 DIAGNOSIS — Z510 Encounter for antineoplastic radiation therapy: Secondary | ICD-10-CM | POA: Diagnosis not present

## 2017-12-20 ENCOUNTER — Ambulatory Visit
Admission: RE | Admit: 2017-12-20 | Discharge: 2017-12-20 | Disposition: A | Discharge status: Home / Self Care | Payer: Medicaid Other | Source: Ambulatory Visit | Attending: Radiation Oncology | Admitting: Radiation Oncology

## 2017-12-20 DIAGNOSIS — Z510 Encounter for antineoplastic radiation therapy: Secondary | ICD-10-CM | POA: Diagnosis not present

## 2017-12-21 ENCOUNTER — Ambulatory Visit
Admission: RE | Admit: 2017-12-21 | Discharge: 2017-12-21 | Disposition: A | Discharge status: Home / Self Care | Payer: Medicaid Other | Source: Ambulatory Visit | Attending: Radiation Oncology | Admitting: Radiation Oncology

## 2017-12-21 DIAGNOSIS — Z510 Encounter for antineoplastic radiation therapy: Secondary | ICD-10-CM | POA: Diagnosis not present

## 2017-12-24 ENCOUNTER — Ambulatory Visit
Admission: RE | Admit: 2017-12-24 | Discharge: 2017-12-24 | Disposition: A | Discharge status: Home / Self Care | Payer: Medicaid Other | Source: Ambulatory Visit | Attending: Radiation Oncology | Admitting: Radiation Oncology

## 2017-12-24 DIAGNOSIS — C50411 Malignant neoplasm of upper-outer quadrant of right female breast: Secondary | ICD-10-CM | POA: Insufficient documentation

## 2017-12-24 DIAGNOSIS — Z171 Estrogen receptor negative status [ER-]: Secondary | ICD-10-CM | POA: Diagnosis not present

## 2017-12-24 DIAGNOSIS — Z510 Encounter for antineoplastic radiation therapy: Secondary | ICD-10-CM | POA: Diagnosis not present

## 2017-12-24 MED ORDER — ALRA NON-METALLIC DEODORANT (RAD-ONC): 1.0000 "application " | Freq: Once | TOPICAL | Status: DC

## 2017-12-24 MED ORDER — RADIAPLEXRX EX GEL: Freq: Two times a day (BID) | CUTANEOUS | Status: DC

## 2017-12-25 ENCOUNTER — Ambulatory Visit
Admission: RE | Admit: 2017-12-25 | Discharge: 2017-12-25 | Disposition: A | Discharge status: Home / Self Care | Payer: Medicaid Other | Source: Ambulatory Visit | Attending: Radiation Oncology | Admitting: Radiation Oncology

## 2017-12-25 DIAGNOSIS — Z510 Encounter for antineoplastic radiation therapy: Secondary | ICD-10-CM | POA: Diagnosis not present

## 2017-12-25 NOTE — Progress Notes (Signed)

## 2017-12-26 ENCOUNTER — Ambulatory Visit
Admission: RE | Admit: 2017-12-26 | Discharge: 2017-12-26 | Disposition: A | Discharge status: Home / Self Care | Payer: Medicaid Other | Source: Ambulatory Visit | Attending: Radiation Oncology | Admitting: Radiation Oncology

## 2017-12-26 DIAGNOSIS — Z510 Encounter for antineoplastic radiation therapy: Secondary | ICD-10-CM | POA: Diagnosis not present

## 2017-12-27 ENCOUNTER — Other Ambulatory Visit: Payer: Self-pay | Admitting: Hematology and Oncology

## 2017-12-27 ENCOUNTER — Ambulatory Visit
Admission: RE | Admit: 2017-12-27 | Discharge: 2017-12-27 | Disposition: A | Discharge status: Home / Self Care | Payer: Medicaid Other | Source: Ambulatory Visit | Attending: Radiation Oncology | Admitting: Radiation Oncology

## 2017-12-27 DIAGNOSIS — Z510 Encounter for antineoplastic radiation therapy: Secondary | ICD-10-CM | POA: Diagnosis not present

## 2017-12-27 DIAGNOSIS — Z171 Estrogen receptor negative status [ER-]: Principal | ICD-10-CM

## 2017-12-27 DIAGNOSIS — C50411 Malignant neoplasm of upper-outer quadrant of right female breast: Secondary | ICD-10-CM

## 2017-12-28 ENCOUNTER — Ambulatory Visit
Admission: RE | Admit: 2017-12-28 | Discharge: 2017-12-28 | Disposition: A | Discharge status: Home / Self Care | Payer: Medicaid Other | Source: Ambulatory Visit | Attending: Radiation Oncology | Admitting: Radiation Oncology

## 2017-12-28 DIAGNOSIS — Z510 Encounter for antineoplastic radiation therapy: Secondary | ICD-10-CM | POA: Diagnosis not present

## 2017-12-31 ENCOUNTER — Ambulatory Visit
Admission: RE | Admit: 2017-12-31 | Discharge: 2017-12-31 | Disposition: A | Discharge status: Home / Self Care | Payer: Medicaid Other | Source: Ambulatory Visit | Attending: Radiation Oncology | Admitting: Radiation Oncology

## 2017-12-31 DIAGNOSIS — Z510 Encounter for antineoplastic radiation therapy: Secondary | ICD-10-CM | POA: Diagnosis not present

## 2018-01-01 ENCOUNTER — Ambulatory Visit
Admission: RE | Admit: 2018-01-01 | Discharge: 2018-01-01 | Disposition: A | Discharge status: Home / Self Care | Payer: Medicaid Other | Source: Ambulatory Visit | Attending: Radiation Oncology | Admitting: Radiation Oncology

## 2018-01-01 DIAGNOSIS — Z510 Encounter for antineoplastic radiation therapy: Secondary | ICD-10-CM | POA: Diagnosis not present

## 2018-01-02 ENCOUNTER — Ambulatory Visit
Admission: RE | Admit: 2018-01-02 | Discharge: 2018-01-02 | Disposition: A | Discharge status: Home / Self Care | Payer: Medicaid Other | Source: Ambulatory Visit | Attending: Radiation Oncology | Admitting: Radiation Oncology

## 2018-01-02 DIAGNOSIS — Z510 Encounter for antineoplastic radiation therapy: Secondary | ICD-10-CM | POA: Diagnosis not present

## 2018-01-03 ENCOUNTER — Ambulatory Visit
Admission: RE | Admit: 2018-01-03 | Discharge: 2018-01-03 | Disposition: A | Discharge status: Home / Self Care | Payer: Medicaid Other | Source: Ambulatory Visit | Attending: Radiation Oncology | Admitting: Radiation Oncology

## 2018-01-03 DIAGNOSIS — Z510 Encounter for antineoplastic radiation therapy: Secondary | ICD-10-CM | POA: Diagnosis not present

## 2018-01-04 ENCOUNTER — Other Ambulatory Visit: Payer: Self-pay | Admitting: *Deleted

## 2018-01-04 ENCOUNTER — Ambulatory Visit
Admission: RE | Admit: 2018-01-04 | Discharge: 2018-01-04 | Disposition: A | Discharge status: Home / Self Care | Payer: Medicaid Other | Source: Ambulatory Visit | Attending: Radiation Oncology | Admitting: Radiation Oncology

## 2018-01-04 DIAGNOSIS — Z510 Encounter for antineoplastic radiation therapy: Secondary | ICD-10-CM | POA: Diagnosis not present

## 2018-01-04 DIAGNOSIS — C50411 Malignant neoplasm of upper-outer quadrant of right female breast: Secondary | ICD-10-CM

## 2018-01-04 DIAGNOSIS — Z171 Estrogen receptor negative status [ER-]: Principal | ICD-10-CM

## 2018-01-07 ENCOUNTER — Inpatient Hospital Stay: Payer: Medicaid Other | Attending: Hematology and Oncology

## 2018-01-07 ENCOUNTER — Ambulatory Visit
Admission: RE | Admit: 2018-01-07 | Discharge: 2018-01-07 | Disposition: A | Discharge status: Home / Self Care | Payer: Medicaid Other | Source: Ambulatory Visit | Attending: Radiation Oncology | Admitting: Radiation Oncology

## 2018-01-07 ENCOUNTER — Other Ambulatory Visit: Payer: Self-pay

## 2018-01-07 ENCOUNTER — Ambulatory Visit: Payer: Medicaid Other | Admitting: Radiation Oncology

## 2018-01-07 ENCOUNTER — Inpatient Hospital Stay: Payer: Medicaid Other

## 2018-01-07 VITALS — BP 126/86 | HR 63 | Temp 98.3°F | Resp 17

## 2018-01-07 DIAGNOSIS — Z171 Estrogen receptor negative status [ER-]: Principal | ICD-10-CM

## 2018-01-07 DIAGNOSIS — Z5112 Encounter for antineoplastic immunotherapy: Secondary | ICD-10-CM | POA: Diagnosis not present

## 2018-01-07 DIAGNOSIS — C50411 Malignant neoplasm of upper-outer quadrant of right female breast: Secondary | ICD-10-CM

## 2018-01-07 DIAGNOSIS — Z510 Encounter for antineoplastic radiation therapy: Secondary | ICD-10-CM | POA: Diagnosis not present

## 2018-01-07 LAB — CMP (CANCER CENTER ONLY)
ALK PHOS: 85 U/L (ref 40–150)
ALT: 14 U/L (ref 0–55)
ANION GAP: 8 (ref 3–11)
AST: 13 U/L (ref 5–34)
Albumin: 3.9 g/dL (ref 3.5–5.0)
BUN: 6 mg/dL — ABNORMAL LOW (ref 7–26)
CALCIUM: 9.6 mg/dL (ref 8.4–10.4)
CO2: 27 mmol/L (ref 22–29)
Chloride: 107 mmol/L (ref 98–109)
Creatinine: 0.68 mg/dL (ref 0.60–1.10)
GFR, Estimated: >60 mL/min (ref >60)
Glucose, Bld: 88 mg/dL (ref 70–140)
Potassium: 3.8 mmol/L (ref 3.5–5.1)
SODIUM: 142 mmol/L (ref 136–145)
Total Bilirubin: 0.3 mg/dL (ref 0.2–1.2)
Total Protein: 7.1 g/dL (ref 6.4–8.3)

## 2018-01-07 LAB — CBC WITH DIFFERENTIAL (CANCER CENTER ONLY)
Basophils Absolute: 0 10*3/uL (ref 0.0–0.1)
Basophils Relative: 1 %
EOS ABS: 0.2 10*3/uL (ref 0.0–0.5)
EOS PCT: 6 %
HCT: 36.3 % (ref 34.8–46.6)
HEMOGLOBIN: 11.5 g/dL — AB (ref 11.6–15.9)
LYMPHS ABS: 1.7 10*3/uL (ref 0.9–3.3)
Lymphocytes Relative: 43 %
MCH: 27 pg (ref 25.1–34.0)
MCHC: 31.7 g/dL (ref 31.5–36.0)
MCV: 85.2 fL (ref 79.5–101.0)
MONOS PCT: 7 %
Monocytes Absolute: 0.3 10*3/uL (ref 0.1–0.9)
Neutro Abs: 1.7 10*3/uL (ref 1.5–6.5)
Neutrophils Relative %: 43 %
PLATELETS: 204 10*3/uL (ref 145–400)
RBC: 4.26 MIL/uL (ref 3.70–5.45)
RDW: 15.2 % — AB (ref 11.2–14.5)
WBC Count: 3.9 10*3/uL (ref 3.9–10.3)

## 2018-01-07 MED ORDER — ACETAMINOPHEN 325 MG PO TABS
ORAL_TABLET | ORAL | Status: AC
  Filled 2018-01-07: qty 2

## 2018-01-07 MED ORDER — DIPHENHYDRAMINE HCL 25 MG PO CAPS
50.0000 mg | ORAL_CAPSULE | Freq: Once | ORAL | Status: AC
  Administered 2018-01-07: 50 mg via ORAL

## 2018-01-07 MED ORDER — TRASTUZUMAB CHEMO 150 MG IV SOLR
450.0000 mg | Freq: Once | INTRAVENOUS | Status: AC
  Administered 2018-01-07: 450 mg via INTRAVENOUS
  Filled 2018-01-07: qty 21.43

## 2018-01-07 MED ORDER — ACETAMINOPHEN 325 MG PO TABS
650.0000 mg | ORAL_TABLET | Freq: Once | ORAL | Status: AC
  Administered 2018-01-07: 650 mg via ORAL

## 2018-01-07 MED ORDER — SODIUM CHLORIDE 0.9% FLUSH
10.0000 mL | INTRAVENOUS | Status: DC | PRN
  Administered 2018-01-07: 10 mL
  Filled 2018-01-07: qty 10

## 2018-01-07 MED ORDER — HEPARIN SOD (PORK) LOCK FLUSH 100 UNIT/ML IV SOLN
500.0000 [IU] | Freq: Once | INTRAVENOUS | Status: AC | PRN
Start: 2018-01-07 — End: 2018-01-07
  Administered 2018-01-07: 500 [IU]
  Filled 2018-01-07: qty 5

## 2018-01-07 MED ORDER — SODIUM CHLORIDE 0.9 % IV SOLN
Freq: Once | INTRAVENOUS | Status: AC
  Administered 2018-01-07: 09:00:00 via INTRAVENOUS

## 2018-01-07 MED ORDER — DIPHENHYDRAMINE HCL 25 MG PO CAPS
ORAL_CAPSULE | ORAL | Status: AC
  Filled 2018-01-07: qty 2

## 2018-01-07 NOTE — Patient Instructions (Signed)
[facility] Discharge Instructions for Patients Receiving Chemotherapy  Today you received the following chemotherapy agents Herceptin  To help prevent nausea and vomiting after your treatment, we encourage you to take your nausea medication as directed   If you develop nausea and vomiting that is not controlled by your nausea medication, call the clinic.   BELOW ARE SYMPTOMS THAT SHOULD BE REPORTED IMMEDIATELY:  *FEVER GREATER THAN 100.5 F  *CHILLS WITH OR WITHOUT FEVER  NAUSEA AND VOMITING THAT IS NOT CONTROLLED WITH YOUR NAUSEA MEDICATION  *UNUSUAL SHORTNESS OF BREATH  *UNUSUAL BRUISING OR BLEEDING  TENDERNESS IN MOUTH AND THROAT WITH OR WITHOUT PRESENCE OF ULCERS  *URINARY PROBLEMS  *BOWEL PROBLEMS  UNUSUAL RASH Items with * indicate a potential emergency and should be followed up as soon as possible.  Feel free to call the clinic should you have any questions or concerns. The clinic phone number is [phone].  Please show the CHEMO ALERT CARD at check-in to the Emergency Department and triage nurse.   

## 2018-01-08 ENCOUNTER — Ambulatory Visit
Admission: RE | Admit: 2018-01-08 | Discharge: 2018-01-08 | Disposition: A | Discharge status: Home / Self Care | Payer: Medicaid Other | Source: Ambulatory Visit | Attending: Radiation Oncology | Admitting: Radiation Oncology

## 2018-01-08 DIAGNOSIS — Z171 Estrogen receptor negative status [ER-]: Principal | ICD-10-CM

## 2018-01-08 DIAGNOSIS — C50411 Malignant neoplasm of upper-outer quadrant of right female breast: Secondary | ICD-10-CM

## 2018-01-08 DIAGNOSIS — Z510 Encounter for antineoplastic radiation therapy: Secondary | ICD-10-CM | POA: Diagnosis not present

## 2018-01-08 MED ORDER — RADIAPLEXRX EX GEL
Freq: Once | CUTANEOUS | Status: AC
  Administered 2018-01-08: 11:00:00 via TOPICAL

## 2018-01-09 ENCOUNTER — Ambulatory Visit
Admission: RE | Admit: 2018-01-09 | Discharge: 2018-01-09 | Disposition: A | Discharge status: Home / Self Care | Payer: Medicaid Other | Source: Ambulatory Visit | Attending: Radiation Oncology | Admitting: Radiation Oncology

## 2018-01-09 DIAGNOSIS — Z510 Encounter for antineoplastic radiation therapy: Secondary | ICD-10-CM | POA: Diagnosis not present

## 2018-01-10 ENCOUNTER — Ambulatory Visit
Admission: RE | Admit: 2018-01-10 | Discharge: 2018-01-10 | Disposition: A | Discharge status: Home / Self Care | Payer: Medicaid Other | Source: Ambulatory Visit | Attending: Radiation Oncology | Admitting: Radiation Oncology

## 2018-01-10 DIAGNOSIS — Z510 Encounter for antineoplastic radiation therapy: Secondary | ICD-10-CM | POA: Diagnosis not present

## 2018-01-11 ENCOUNTER — Ambulatory Visit
Admission: RE | Admit: 2018-01-11 | Discharge: 2018-01-11 | Disposition: A | Discharge status: Home / Self Care | Payer: Medicaid Other | Source: Ambulatory Visit | Attending: Radiation Oncology | Admitting: Radiation Oncology

## 2018-01-11 DIAGNOSIS — Z510 Encounter for antineoplastic radiation therapy: Secondary | ICD-10-CM | POA: Diagnosis not present

## 2018-01-14 ENCOUNTER — Ambulatory Visit
Admission: RE | Admit: 2018-01-14 | Discharge: 2018-01-14 | Disposition: A | Discharge status: Home / Self Care | Payer: Medicaid Other | Source: Ambulatory Visit | Attending: Radiation Oncology | Admitting: Radiation Oncology

## 2018-01-14 DIAGNOSIS — C50411 Malignant neoplasm of upper-outer quadrant of right female breast: Secondary | ICD-10-CM

## 2018-01-14 DIAGNOSIS — Z510 Encounter for antineoplastic radiation therapy: Secondary | ICD-10-CM | POA: Diagnosis not present

## 2018-01-14 DIAGNOSIS — Z171 Estrogen receptor negative status [ER-]: Principal | ICD-10-CM

## 2018-01-14 MED ORDER — RADIAPLEXRX EX GEL
Freq: Once | CUTANEOUS | Status: AC
  Administered 2018-01-14: 09:00:00 via TOPICAL

## 2018-01-15 ENCOUNTER — Ambulatory Visit
Admission: RE | Admit: 2018-01-15 | Discharge: 2018-01-15 | Disposition: A | Discharge status: Home / Self Care | Payer: Medicaid Other | Source: Ambulatory Visit | Attending: Radiation Oncology | Admitting: Radiation Oncology

## 2018-01-15 DIAGNOSIS — Z510 Encounter for antineoplastic radiation therapy: Secondary | ICD-10-CM | POA: Diagnosis not present

## 2018-01-19 MED FILL — LIDOCAINE-PRILOCAINE CREAM: 2.5-2.5 | 10 days supply | Qty: 30 | Fill #2

## 2018-01-21 ENCOUNTER — Other Ambulatory Visit: Payer: Self-pay | Admitting: Hematology and Oncology

## 2018-01-21 DIAGNOSIS — Z171 Estrogen receptor negative status [ER-]: Principal | ICD-10-CM

## 2018-01-21 DIAGNOSIS — C50411 Malignant neoplasm of upper-outer quadrant of right female breast: Secondary | ICD-10-CM

## 2018-01-23 ENCOUNTER — Encounter: Payer: Self-pay | Admitting: Radiation Oncology

## 2018-01-23 NOTE — Progress Notes (Signed)
  Radiation Oncology         [phone] ________________________________  Name: Katherine [name] MRN: [id number]  Date: 01/23/2018  DOB: 10-12-1970  End of Treatment Note  Diagnosis:   Malignant neoplasm of upper-outer quadrant of right breast in female, estrogen receptor negative ([place]) Staging form: Breast, AJCC 8th Edition - Clinical: Stage IA (cT1c, cN0, cM0, G3, ER: Negative, PR: Negative, HER2: Positive) - Signed by Gardenia Phlegm, NP on 07/04/2017  StageIA pT1cN0, T1cN0M0 RightBreast UOQ Invasive Ductal Carcinoma, ER(-)/ PR(-)/ Ki-67 40%, HER-2 positive ratio 3.59, Grade3     Indication for treatment:  Curative       Radiation treatment dates:   12/19/2017-01/15/2018  Site/dose:   1. Right breast, 2.67 Gy in 15 fractions for a total dose of 40.05 Gy           2. Boost, 2 Gy in 5 fractions for a total dose of 10 Gy  Beams/energy:   1. 3D, 10X        2. Isodose plan, 15X//10X  Narrative: The patient tolerated radiation treatment relatively well. At the beginning of treatments, she reported hot/cold flashes and denied any pain or fatigue. She was given Radiaplex and Alra. Towards the end of her treatments, she reported soreness and and itching to her right breast. She was noted to be using Radiaplex BID. She denied pain or fatigue. On PE, it was noted, hyperpigmentation over right breast with skin intact.   Plan: The patient has completed radiation treatment. The patient will return to radiation oncology clinic for routine followup in one month. I advised them to call or return sooner if they have any questions or concerns related to their recovery or treatment.  -----------------------------------  Eppie Gibson, MD  This document serves as a record of services personally performed by Eppie Gibson, MD. It was created on her behalf by Steva Colder, a trained medical scribe. The creation of this record is based on the scribe's personal observations and the provider's  statements to them. This document has been checked and approved by the attending provider.

## 2018-01-25 ENCOUNTER — Other Ambulatory Visit: Payer: Self-pay | Admitting: *Deleted

## 2018-01-25 DIAGNOSIS — Z171 Estrogen receptor negative status [ER-]: Principal | ICD-10-CM

## 2018-01-25 DIAGNOSIS — C50411 Malignant neoplasm of upper-outer quadrant of right female breast: Secondary | ICD-10-CM

## 2018-01-28 ENCOUNTER — Inpatient Hospital Stay: Payer: Medicaid Other

## 2018-01-28 ENCOUNTER — Inpatient Hospital Stay (HOSPITAL_BASED_OUTPATIENT_CLINIC_OR_DEPARTMENT_OTHER): Payer: Medicaid Other | Admitting: Hematology and Oncology

## 2018-01-28 ENCOUNTER — Inpatient Hospital Stay: Payer: Medicaid Other | Attending: Hematology and Oncology

## 2018-01-28 ENCOUNTER — Telehealth: Payer: Self-pay

## 2018-01-28 DIAGNOSIS — C50411 Malignant neoplasm of upper-outer quadrant of right female breast: Secondary | ICD-10-CM

## 2018-01-28 DIAGNOSIS — Z293 Encounter for prophylactic fluoride administration: Secondary | ICD-10-CM | POA: Diagnosis not present

## 2018-01-28 DIAGNOSIS — Z171 Estrogen receptor negative status [ER-]: Secondary | ICD-10-CM | POA: Diagnosis not present

## 2018-01-28 DIAGNOSIS — Z5112 Encounter for antineoplastic immunotherapy: Secondary | ICD-10-CM | POA: Diagnosis not present

## 2018-01-28 DIAGNOSIS — Z95828 Presence of other vascular implants and grafts: Secondary | ICD-10-CM

## 2018-01-28 LAB — CBC WITH DIFFERENTIAL (CANCER CENTER ONLY)
BASOS ABS: 0 10*3/uL (ref 0.0–0.1)
Basophils Relative: 1 %
EOS ABS: 0.1 10*3/uL (ref 0.0–0.5)
EOS PCT: 4 %
HCT: 34.4 % — ABNORMAL LOW (ref 34.8–46.6)
Hemoglobin: 11.2 g/dL — ABNORMAL LOW (ref 11.6–15.9)
Lymphocytes Relative: 36 %
Lymphs Abs: 1.3 10*3/uL (ref 0.9–3.3)
MCH: 27 pg (ref 25.1–34.0)
MCHC: 32.5 g/dL (ref 31.5–36.0)
MCV: 83.1 fL (ref 79.5–101.0)
Monocytes Absolute: 0.3 10*3/uL (ref 0.1–0.9)
Monocytes Relative: 8 %
Neutro Abs: 2 10*3/uL (ref 1.5–6.5)
Neutrophils Relative %: 51 %
PLATELETS: 182 10*3/uL (ref 145–400)
RBC: 4.14 MIL/uL (ref 3.70–5.45)
RDW: 14.7 % — ABNORMAL HIGH (ref 11.2–14.5)
WBC: 3.8 10*3/uL — AB (ref 3.9–10.3)

## 2018-01-28 LAB — CMP (CANCER CENTER ONLY)
ALT: 9 U/L (ref 0–55)
AST: 13 U/L (ref 5–34)
Albumin: 3.9 g/dL (ref 3.5–5.0)
Alkaline Phosphatase: 83 U/L (ref 40–150)
Anion gap: 4 (ref 3–11)
BILIRUBIN TOTAL: 0.2 mg/dL (ref 0.2–1.2)
BUN: 9 mg/dL (ref 7–26)
CALCIUM: 8.9 mg/dL (ref 8.4–10.4)
CO2: 27 mmol/L (ref 22–29)
CREATININE: 0.7 mg/dL (ref 0.60–1.10)
Chloride: 109 mmol/L (ref 98–109)
GFR, Est AFR Am: >60 mL/min (ref >60)
Glucose, Bld: 90 mg/dL (ref 70–140)
Potassium: 3.6 mmol/L (ref 3.5–5.1)
Sodium: 140 mmol/L (ref 136–145)
TOTAL PROTEIN: 6.6 g/dL (ref 6.4–8.3)

## 2018-01-28 MED ORDER — DIPHENHYDRAMINE HCL 25 MG PO CAPS
50.0000 mg | ORAL_CAPSULE | Freq: Once | ORAL | Status: AC
  Administered 2018-01-28: 50 mg via ORAL

## 2018-01-28 MED ORDER — LIDOCAINE-PRILOCAINE 2.5-2.5 % EX CREA: TOPICAL_CREAM | CUTANEOUS | 3 refills | Status: DC

## 2018-01-28 MED ORDER — VENLAFAXINE HCL ER 37.5 MG PO CP24: 37.5000 mg | ORAL_CAPSULE | Freq: Every day | ORAL | 3 refills | Status: DC

## 2018-01-28 MED ORDER — CIPROFLOXACIN HCL 500 MG PO TABS: 500.0000 mg | ORAL_TABLET | Freq: Two times a day (BID) | ORAL | 0 refills | Status: DC

## 2018-01-28 MED ORDER — SODIUM CHLORIDE 0.9% FLUSH
10.0000 mL | INTRAVENOUS | Status: DC | PRN
  Administered 2018-01-28: 10 mL
  Filled 2018-01-28: qty 10

## 2018-01-28 MED ORDER — ACETAMINOPHEN 325 MG PO TABS
650.0000 mg | ORAL_TABLET | Freq: Once | ORAL | Status: AC
  Administered 2018-01-28: 650 mg via ORAL

## 2018-01-28 MED ORDER — FAMOTIDINE 20 MG PO TABS: 20.0000 mg | ORAL_TABLET | Freq: Two times a day (BID) | ORAL | 3 refills | Status: DC

## 2018-01-28 MED ORDER — SODIUM CHLORIDE 0.9 % IV SOLN
Freq: Once | INTRAVENOUS | Status: AC
  Administered 2018-01-28: 11:00:00 via INTRAVENOUS

## 2018-01-28 MED ORDER — DIPHENHYDRAMINE HCL 25 MG PO CAPS
ORAL_CAPSULE | ORAL | Status: AC
  Filled 2018-01-28: qty 2

## 2018-01-28 MED ORDER — ACETAMINOPHEN 325 MG PO TABS
ORAL_TABLET | ORAL | Status: AC
Start: 2018-01-28 — End: 2018-01-28
  Filled 2018-01-28: qty 2

## 2018-01-28 MED ORDER — DIPHENOXYLATE-ATROPINE 2.5-0.025 MG PO TABS: ORAL_TABLET | ORAL | 0 refills | Status: DC

## 2018-01-28 MED ORDER — TRASTUZUMAB CHEMO 150 MG IV SOLR
450.0000 mg | Freq: Once | INTRAVENOUS | Status: AC
  Administered 2018-01-28: 450 mg via INTRAVENOUS
  Filled 2018-01-28: qty 21.43

## 2018-01-28 MED ORDER — HEPARIN SOD (PORK) LOCK FLUSH 100 UNIT/ML IV SOLN
500.0000 [IU] | Freq: Once | INTRAVENOUS | Status: AC | PRN
  Administered 2018-01-28: 500 [IU]
  Filled 2018-01-28: qty 5

## 2018-01-28 MED ORDER — SODIUM CHLORIDE 0.9% FLUSH
10.0000 mL | INTRAVENOUS | Status: DC | PRN
  Administered 2018-01-28: 10 mL via INTRAVENOUS
  Filled 2018-01-28: qty 10

## 2018-01-28 MED FILL — VENLAFAXINE HCL ER 37.5 MG: 37.5 | 30 days supply | Qty: 30 | Fill #0

## 2018-01-28 MED FILL — CIPROFLOXACIN HCL 500 MG TA: 500 | 7 days supply | Qty: 14 | Fill #0

## 2018-01-28 NOTE — Progress Notes (Signed)
Patient Care Team: Javier Docker, MD as PCP - General (Internal Medicine) Erroll Luna, MD as Consulting Physician (General Surgery)  DIAGNOSIS:  Encounter Diagnosis  Name Primary?  . Malignant neoplasm of upper-outer quadrant of right breast in female, estrogen receptor negative ([place])     SUMMARY OF ONCOLOGIC HISTORY:   Malignant neoplasm of upper-outer quadrant of right breast in female, estrogen receptor negative ([place])   06/26/2017 Initial Diagnosis    Right breast biopsy 8:30 position 8 cm from nipple: IDC with DCIS, second biopsy 6 cm from nipple fibrocystic changes: Grade 3, ER 0%, PR 0%, Ki-67 40%, HER-2 positive ratio 3.59; mammogram and ultrasound revealed 1.9 cm mass at 8:30 position, adjacent 0.7 cm cystic mass, T1c N0 stage IA AJCC 8       07/24/2017 Surgery    Right lumpectomy: IDC grade 3, 1.8 cm, 0/2 lymph nodes negative,ER 0%, PR 0%, Ki-67 40%, HER-2 positive ratio 3.59, T1CN0 stage I a       09/10/2017 - 11/26/2017 Chemotherapy    Taxol Herceptin weekly x12 followed by Herceptin maintenance for 1 year      12/19/2017 - 01/15/2018 Radiation Therapy    Adjuvant radiation therapy       CHIEF COMPLIANT: Follow-up on Herceptin therapy  INTERVAL HISTORY: Katherine [name] is a 47 year old with above-mentioned history of right breast cancer treated with lumpectomy adjuvant chemotherapy with Taxol Herceptin.  She is currently on Herceptin maintenance.  She completed adjuvant radiation therapy on 01/15/2018.  She had mild peripheral neuropathy related to Taxol which is currently being observed.  She had hot flashes which got better with the Effexor.  We are monitoring her closely for Herceptin related toxicities. Intermittent diarrhea REVIEW OF SYSTEMS:   Constitutional: Denies fevers, chills or abnormal weight loss Eyes: Denies blurriness of vision Ears, nose, mouth, throat, and face: Denies mucositis or sore throat Respiratory: Denies cough, dyspnea or  wheezes Cardiovascular: Denies palpitation, chest discomfort Gastrointestinal: Intermittent diarrhea Skin: Denies abnormal skin rashes Lymphatics: Denies new lymphadenopathy or easy bruising Neurological mild numbness of extremities Behavioral/Psych: Mood is stable, no new changes  Extremities: No lower extremity edema  All other systems were reviewed with the patient and are negative.  I have reviewed the past medical history, past surgical history, social history and family history with the patient and they are unchanged from previous note.  ALLERGIES:  is allergic to morphine; shellfish allergy; penicillins; codeine; hydrocodone; and tomato.  MEDICATIONS:  Current Outpatient Medications  Medication Sig Dispense Refill  . albuterol (PROVENTIL HFA;VENTOLIN HFA) 108 (90 Base) MCG/ACT inhaler Inhale 2 puffs into the lungs every 6 (six) hours as needed for wheezing or shortness of breath.    . ciprofloxacin (CIPRO) 500 MG tablet Take 1 tablet (500 mg total) by mouth 2 (two) times daily. 14 tablet 0  . diphenoxylate-atropine (LOMOTIL) 2.5-0.025 MG tablet TAKE 1 TABLET BY MOUTH 4 TIMES DAILY AS NEEDED FOR DIARRHEA OR LOOSE STOOLS 30 tablet 0  . famotidine (PEPCID) 20 MG tablet Take 1 tablet (20 mg total) by mouth 2 (two) times daily. 90 tablet 3  . ibuprofen (ADVIL,MOTRIN) 600 MG tablet Take 1 tablet (600 mg total) by mouth every 6 (six) hours as needed. 30 tablet 0  . ipratropium (ATROVENT) 0.03 % nasal spray SPRAY TWO SQUIRTS INTO EACH NOSTRIL TWICE A DAY  1  . lidocaine-prilocaine (EMLA) cream Apply to affected area once 30 g 3  . pantoprazole (PROTONIX) 40 MG tablet Take 40 mg by mouth daily.    Marland Kitchen  venlafaxine XR (EFFEXOR-XR) 37.5 MG 24 hr capsule Take 1 capsule (37.5 mg total) by mouth at bedtime. 30 capsule 3   No current facility-administered medications for this visit.     PHYSICAL EXAMINATION: ECOG PERFORMANCE STATUS: 1 - Symptomatic but completely ambulatory  Vitals:    01/28/18 1020  BP: 126/83  Pulse: 60  Resp: 17  Temp: 98.6 F (37 C)  SpO2: 100%   Filed Weights   01/28/18 1020  Weight: 162 lb 1.6 oz (73.5 kg)    GENERAL:alert, no distress and comfortable SKIN: skin color, texture, turgor are normal, no rashes or significant lesions EYES: normal, Conjunctiva are pink and non-injected, sclera clear OROPHARYNX:no exudate, no erythema and lips, buccal mucosa, and tongue normal  NECK: supple, thyroid normal size, non-tender, without nodularity LYMPH:  no palpable lymphadenopathy in the cervical, axillary or inguinal LUNGS: clear to auscultation and percussion with normal breathing effort HEART: regular rate & rhythm and no murmurs and no lower extremity edema ABDOMEN:abdomen soft, non-tender and normal bowel sounds MUSCULOSKELETAL:no cyanosis of digits and no clubbing  NEURO: alert & oriented x 3 with fluent speech, mild peripheral neuropathy improving slowly EXTREMITIES: No lower extremity edema  LABORATORY DATA:  I have reviewed the data as listed CMP Latest Ref Rng & Units 01/28/2018 01/07/2018 12/17/2017  Glucose 70 - 140 mg/dL 90 88 109  BUN 7 - 26 mg/dL 9 6(L) 9  Creatinine 0.60 - 1.10 mg/dL 0.70 0.68 0.62  Sodium 136 - 145 mmol/L 140 142 141  Potassium 3.5 - 5.1 mmol/L 3.6 3.8 3.7  Chloride 98 - 109 mmol/L 109 107 107  CO2 22 - 29 mmol/L 27 27 24  Calcium 8.4 - 10.4 mg/dL 8.9 9.6 9.4  Total Protein 6.4 - 8.3 g/dL 6.6 7.1 6.5  Total Bilirubin 0.2 - 1.2 mg/dL 0.2 0.3 <0.2(L)  Alkaline Phos 40 - 150 U/L 83 85 89  AST 5 - 34 U/L 13 13 14  ALT 0 - 55 U/L 9 14 18    Lab Results  Component Value Date   WBC 3.8 (L) 01/28/2018   HGB 11.2 (L) 01/28/2018   HCT 34.4 (L) 01/28/2018   MCV 83.1 01/28/2018   PLT 182 01/28/2018   NEUTROABS 2.0 01/28/2018    ASSESSMENT & PLAN:  Malignant neoplasm of upper-outer quadrant of right breast in female, estrogen receptor negative (HCC) 07/24/2017:Right lumpectomy: IDC grade 3, 1.8 cm, 0/2 lymph  nodes negative,ER 0%, PR 0%, Ki-67 40%, HER-2 positive ratio 3.59, T1CN0 stage I a  Recommendation: 1. Adjuvant chemotherapy with Taxol Herceptin completed 11/26/2017 followed by Herceptin maintenance for 1 year 2. followed by adjuvant radiation therapy ------------------------------------------------------------------------------------------------------------------------------------ Current treatment: Herceptin maintenance therapy. Adjuvant radiation therapy.  12/19/2017 to 01/15/2018 Because she is ER PR negative there is no role of adjuvant antiestrogen therapy.  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 S intermittent diarrhea:ent the prescription for the most for Lomotil  Return to clinic every 3 weeks for Herceptin every 6 weeks for follow-up with me  No orders of the defined types were placed in this encounter.  The patient has a good understanding of the overall plan. she agrees with it. she will call with any problems that may develop before the next visit here.   Viinay K , MD 01/28/18    

## 2018-01-28 NOTE — Telephone Encounter (Signed)
Called pt to let her know of echo appt next week. Pt confirmed time/date for echo at [facility].

## 2018-01-28 NOTE — Addendum Note (Signed)
Addended by: Thelma Barge MAY J on: 01/28/2018 12:34 PM   Modules accepted: Orders

## 2018-01-28 NOTE — Assessment & Plan Note (Signed)
07/24/2017:Right lumpectomy: IDC grade 3, 1.8 cm, 0/2 lymph nodes negative,ER 0%, PR 0%, Ki-67 40%, HER-2 positive ratio 3.59, T1CN0 stage I a  Recommendation: 1. Adjuvant chemotherapy with Taxol Herceptin completed 11/26/2017 followed by Herceptin maintenance for 1 year 2. followed by adjuvant radiation therapy ------------------------------------------------------------------------------------------------------------------------------------ Current treatment: Herceptin maintenance therapy. Adjuvant radiation therapy.  12/19/2017 to 01/15/2018 Because she is ER PR negative there is no role of adjuvant antiestrogen therapy.  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  Return to clinic every 3 weeks for Herceptin every 6 weeks for follow-up with me

## 2018-01-28 NOTE — Patient Instructions (Signed)
[facility] Discharge Instructions for Patients Receiving Chemotherapy  Today you received the following chemotherapy agents Herceptin  To help prevent nausea and vomiting after your treatment, we encourage you to take your nausea medication as directed   If you develop nausea and vomiting that is not controlled by your nausea medication, call the clinic.   BELOW ARE SYMPTOMS THAT SHOULD BE REPORTED IMMEDIATELY:  *FEVER GREATER THAN 100.5 F  *CHILLS WITH OR WITHOUT FEVER  NAUSEA AND VOMITING THAT IS NOT CONTROLLED WITH YOUR NAUSEA MEDICATION  *UNUSUAL SHORTNESS OF BREATH  *UNUSUAL BRUISING OR BLEEDING  TENDERNESS IN MOUTH AND THROAT WITH OR WITHOUT PRESENCE OF ULCERS  *URINARY PROBLEMS  *BOWEL PROBLEMS  UNUSUAL RASH Items with * indicate a potential emergency and should be followed up as soon as possible.  Feel free to call the clinic should you have any questions or concerns. The clinic phone number is [phone].  Please show the CHEMO ALERT CARD at check-in to the Emergency Department and triage nurse.   

## 2018-01-29 ENCOUNTER — Telehealth: Payer: Self-pay | Admitting: Hematology and Oncology

## 2018-01-29 NOTE — Telephone Encounter (Signed)
Left message for patient regarding upcoming may through July appointments.

## 2018-02-07 ENCOUNTER — Ambulatory Visit (HOSPITAL_COMMUNITY)
Admission: RE | Admit: 2018-02-07 | Discharge: 2018-02-07 | Disposition: A | Discharge status: Home / Self Care | Payer: Medicaid Other | Source: Ambulatory Visit | Attending: Hematology and Oncology | Admitting: Hematology and Oncology

## 2018-02-07 DIAGNOSIS — C50411 Malignant neoplasm of upper-outer quadrant of right female breast: Secondary | ICD-10-CM | POA: Insufficient documentation

## 2018-02-07 DIAGNOSIS — I517 Cardiomegaly: Secondary | ICD-10-CM | POA: Insufficient documentation

## 2018-02-07 DIAGNOSIS — Z87891 Personal history of nicotine dependence: Secondary | ICD-10-CM | POA: Diagnosis not present

## 2018-02-07 DIAGNOSIS — I340 Nonrheumatic mitral (valve) insufficiency: Secondary | ICD-10-CM | POA: Insufficient documentation

## 2018-02-07 DIAGNOSIS — Z171 Estrogen receptor negative status [ER-]: Secondary | ICD-10-CM | POA: Insufficient documentation

## 2018-02-07 NOTE — Progress Notes (Signed)
  Echocardiogram 2D Echocardiogram has been performed.  [name] 02/07/2018, 11:49 AM

## 2018-02-14 ENCOUNTER — Encounter: Payer: Self-pay | Admitting: Radiation Oncology

## 2018-02-14 NOTE — Progress Notes (Signed)
Katherine [name] presents for follow up of radiation completed 01/15/18 to her Right Breast. Her skin has healed well. She has mild hyperpigmentation to her Right breast. She is using coca butter with vitamin e lotion to her radiation site. She saw Dr. Lindi Adie on 01/28/18. She continues herceptin every 3 weeks. She reports bone pain to her bilateral legs today.   BP 132/88 (BP Location: Left Arm, Patient Position: Sitting, Cuff Size: Normal)   Pulse 60   Temp 98.7 F (37.1 C) (Oral)   Resp 20   Ht 5\' 5"  (1.651 m)   Wt 155 lb 12.8 oz (70.7 kg)   LMP 11/13/2016   SpO2 99%   BMI 25.93 kg/m    Wt Readings from Last 3 Encounters:  02/22/18 155 lb 12.8 oz (70.7 kg)  01/28/18 162 lb 1.6 oz (73.5 kg)  12/17/17 165 lb 1.6 oz (74.9 kg)

## 2018-02-15 ENCOUNTER — Other Ambulatory Visit: Payer: Self-pay

## 2018-02-15 DIAGNOSIS — C50411 Malignant neoplasm of upper-outer quadrant of right female breast: Secondary | ICD-10-CM

## 2018-02-15 DIAGNOSIS — Z803 Family history of malignant neoplasm of breast: Secondary | ICD-10-CM

## 2018-02-15 DIAGNOSIS — Z171 Estrogen receptor negative status [ER-]: Secondary | ICD-10-CM

## 2018-02-19 ENCOUNTER — Inpatient Hospital Stay: Payer: Medicaid Other

## 2018-02-19 VITALS — BP 143/83 | HR 55 | Temp 97.9°F | Resp 16

## 2018-02-19 DIAGNOSIS — C50411 Malignant neoplasm of upper-outer quadrant of right female breast: Secondary | ICD-10-CM

## 2018-02-19 DIAGNOSIS — Z803 Family history of malignant neoplasm of breast: Secondary | ICD-10-CM

## 2018-02-19 DIAGNOSIS — Z171 Estrogen receptor negative status [ER-]: Principal | ICD-10-CM

## 2018-02-19 DIAGNOSIS — Z923 Personal history of irradiation: Secondary | ICD-10-CM | POA: Diagnosis not present

## 2018-02-19 DIAGNOSIS — Z79899 Other long term (current) drug therapy: Secondary | ICD-10-CM | POA: Diagnosis not present

## 2018-02-19 DIAGNOSIS — Z95828 Presence of other vascular implants and grafts: Secondary | ICD-10-CM

## 2018-02-19 DIAGNOSIS — Z5112 Encounter for antineoplastic immunotherapy: Secondary | ICD-10-CM | POA: Diagnosis not present

## 2018-02-19 LAB — COMPREHENSIVE METABOLIC PANEL
ALT: 12 U/L — ABNORMAL LOW (ref 14–54)
ANION GAP: 11 (ref 5–15)
AST: 19 U/L (ref 15–41)
Albumin: 4.2 g/dL (ref 3.5–5.0)
Alkaline Phosphatase: 77 U/L (ref 38–126)
BUN: 9 mg/dL (ref 6–20)
CHLORIDE: 106 mmol/L (ref 101–111)
CO2: 24 mmol/L (ref 22–32)
Calcium: 8.8 mg/dL — ABNORMAL LOW (ref 8.9–10.3)
Creatinine, Ser: 0.63 mg/dL (ref 0.44–1.00)
Glucose, Bld: 94 mg/dL (ref 65–99)
POTASSIUM: 3.5 mmol/L (ref 3.5–5.1)
SODIUM: 141 mmol/L (ref 135–145)
Total Bilirubin: 0.1 mg/dL — ABNORMAL LOW (ref 0.3–1.2)
Total Protein: 7.3 g/dL (ref 6.5–8.1)

## 2018-02-19 LAB — RETICULOCYTES
RBC.: 4.47 MIL/uL (ref 3.70–5.45)
Retic Count, Absolute: 35.8 10*3/uL (ref 33.7–90.7)
Retic Ct Pct: 0.8 % (ref 0.7–2.1)

## 2018-02-19 LAB — CBC WITH DIFFERENTIAL (CANCER CENTER ONLY)
BASOS PCT: 1 %
Basophils Absolute: 0 10*3/uL (ref 0.0–0.1)
EOS PCT: 5 %
Eosinophils Absolute: 0.2 10*3/uL (ref 0.0–0.5)
HCT: 37.5 % (ref 34.8–46.6)
Hemoglobin: 11.9 g/dL (ref 11.6–15.9)
Lymphocytes Relative: 42 %
Lymphs Abs: 1.8 10*3/uL (ref 0.9–3.3)
MCH: 26.6 pg (ref 25.1–34.0)
MCHC: 31.7 g/dL (ref 31.5–36.0)
MCV: 83.9 fL (ref 79.5–101.0)
MONO ABS: 0.4 10*3/uL (ref 0.1–0.9)
MONOS PCT: 8 %
NEUTROS ABS: 2 10*3/uL (ref 1.5–6.5)
Neutrophils Relative %: 44 %
PLATELETS: 217 10*3/uL (ref 145–400)
RBC: 4.47 MIL/uL (ref 3.70–5.45)
RDW: 14.3 % (ref 11.2–14.5)
WBC Count: 4.4 10*3/uL (ref 3.9–10.3)

## 2018-02-19 MED ORDER — ACETAMINOPHEN 325 MG PO TABS
650.0000 mg | ORAL_TABLET | Freq: Once | ORAL | Status: AC
  Administered 2018-02-19: 650 mg via ORAL

## 2018-02-19 MED ORDER — SODIUM CHLORIDE 0.9% FLUSH
10.0000 mL | INTRAVENOUS | Status: DC | PRN
  Administered 2018-02-19: 10 mL via INTRAVENOUS
  Filled 2018-02-19: qty 10

## 2018-02-19 MED ORDER — TRASTUZUMAB CHEMO 150 MG IV SOLR
450.0000 mg | Freq: Once | INTRAVENOUS | Status: AC
  Administered 2018-02-19: 450 mg via INTRAVENOUS
  Filled 2018-02-19: qty 21.43

## 2018-02-19 MED ORDER — HEPARIN SOD (PORK) LOCK FLUSH 100 UNIT/ML IV SOLN
500.0000 [IU] | Freq: Once | INTRAVENOUS | Status: AC | PRN
  Administered 2018-02-19: 500 [IU]
  Filled 2018-02-19: qty 5

## 2018-02-19 MED ORDER — SODIUM CHLORIDE 0.9 % IV SOLN
Freq: Once | INTRAVENOUS | Status: AC
  Administered 2018-02-19: 09:00:00 via INTRAVENOUS

## 2018-02-19 MED ORDER — ACETAMINOPHEN 325 MG PO TABS
ORAL_TABLET | ORAL | Status: AC
  Filled 2018-02-19: qty 2

## 2018-02-19 MED ORDER — SODIUM CHLORIDE 0.9% FLUSH
10.0000 mL | INTRAVENOUS | Status: DC | PRN
  Administered 2018-02-19: 10 mL
  Filled 2018-02-19: qty 10

## 2018-02-19 MED ORDER — DIPHENHYDRAMINE HCL 25 MG PO CAPS
50.0000 mg | ORAL_CAPSULE | Freq: Once | ORAL | Status: AC
  Administered 2018-02-19: 50 mg via ORAL

## 2018-02-19 MED ORDER — DIPHENHYDRAMINE HCL 25 MG PO CAPS
ORAL_CAPSULE | ORAL | Status: AC
  Filled 2018-02-19: qty 2

## 2018-02-19 NOTE — Patient Instructions (Signed)
[facility] Discharge Instructions for Patients Receiving Chemotherapy  Today you received the following chemotherapy agents herceptin   To help prevent nausea and vomiting after your treatment, we encourage you to take your nausea medication as directed   If you develop nausea and vomiting that is not controlled by your nausea medication, call the clinic.   BELOW ARE SYMPTOMS THAT SHOULD BE REPORTED IMMEDIATELY:  *FEVER GREATER THAN 100.5 F  *CHILLS WITH OR WITHOUT FEVER  NAUSEA AND VOMITING THAT IS NOT CONTROLLED WITH YOUR NAUSEA MEDICATION  *UNUSUAL SHORTNESS OF BREATH  *UNUSUAL BRUISING OR BLEEDING  TENDERNESS IN MOUTH AND THROAT WITH OR WITHOUT PRESENCE OF ULCERS  *URINARY PROBLEMS  *BOWEL PROBLEMS  UNUSUAL RASH Items with * indicate a potential emergency and should be followed up as soon as possible.  Feel free to call the clinic you have any questions or concerns. The clinic phone number is [phone].  

## 2018-02-22 ENCOUNTER — Encounter: Payer: Self-pay | Admitting: Radiation Oncology

## 2018-02-22 ENCOUNTER — Other Ambulatory Visit: Payer: Self-pay

## 2018-02-22 ENCOUNTER — Ambulatory Visit
Admission: RE | Admit: 2018-02-22 | Discharge: 2018-02-22 | Disposition: A | Discharge status: Home / Self Care | Payer: Medicaid Other | Source: Ambulatory Visit | Attending: Radiation Oncology | Admitting: Radiation Oncology

## 2018-02-22 VITALS — BP 132/88 | HR 60 | Temp 98.7°F | Resp 20 | Ht 65.0 in | Wt 155.8 lb

## 2018-02-22 DIAGNOSIS — Z171 Estrogen receptor negative status [ER-]: Secondary | ICD-10-CM | POA: Insufficient documentation

## 2018-02-22 DIAGNOSIS — C50411 Malignant neoplasm of upper-outer quadrant of right female breast: Secondary | ICD-10-CM | POA: Diagnosis not present

## 2018-02-22 DIAGNOSIS — Z923 Personal history of irradiation: Secondary | ICD-10-CM | POA: Insufficient documentation

## 2018-02-22 DIAGNOSIS — Z79899 Other long term (current) drug therapy: Secondary | ICD-10-CM | POA: Insufficient documentation

## 2018-02-22 HISTORY — DX: Personal history of irradiation: Z92.3

## 2018-02-22 NOTE — Progress Notes (Signed)
Radiation Oncology         [phone] ________________________________  Name: Katherine [name] MRN: [id number]  Date: 02/22/2018  DOB: 1971/09/11  Follow-Up Visit Note  Outpatient  CC: Pavelock, Ralene Bathe, MD  Nicholas Lose, MD  Diagnosis:     ICD-10-CM   1. Malignant neoplasm of upper-outer quadrant of right breast in female, estrogen receptor negative ([place]) C50.411    Z17.1     Malignant neoplasm of upper-outer quadrant of right breast in female, estrogen receptor negative ([place]). 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  She is doing well. She denies any new palpable lumps or bumps in her breasts. She continues to be followed by medical oncology and is taking She is using cocoa butter with vitamin e lotion to her radiation site. Her last mammogram was on 08/07/2017 it was BI-RADS. She saw Dr. Lindi Adie on 01/28/18. She continues herceptin every 3 weeks. She reports bone pain to her bilateral legs today.                            ALLERGIES:  is allergic to morphine; shellfish allergy; penicillins; codeine; hydrocodone; and tomato.  Meds: Current Outpatient Medications  Medication Sig Dispense Refill  . albuterol (PROVENTIL HFA;VENTOLIN HFA) 108 (90 Base) MCG/ACT inhaler Inhale 2 puffs into the lungs every 6 (six) hours as needed for wheezing or shortness of breath.    . diphenoxylate-atropine (LOMOTIL) 2.5-0.025 MG tablet TAKE 1 TABLET BY MOUTH 4 TIMES DAILY AS NEEDED FOR DIARRHEA OR LOOSE STOOLS 30 tablet 0  . famotidine (PEPCID) 20 MG tablet Take 1 tablet (20 mg total) by mouth 2 (two) times daily. 90 tablet 3  . ibuprofen (ADVIL,MOTRIN) 600 MG tablet Take 1 tablet (600 mg total) by mouth every 6  (six) hours as needed. 30 tablet 0  . ipratropium (ATROVENT) 0.03 % nasal spray SPRAY TWO SQUIRTS INTO EACH NOSTRIL TWICE A DAY  1  . lidocaine-prilocaine (EMLA) cream Apply to affected area once 30 g 3  . pantoprazole (PROTONIX) 40 MG tablet Take 40 mg by mouth daily.    Marland Kitchen venlafaxine XR (EFFEXOR-XR) 37.5 MG 24 hr capsule Take 1 capsule (37.5 mg total) by mouth at bedtime. 30 capsule 3   No current facility-administered medications for this encounter.     Physical Findings: The patient is in no acute distress. Patient is alert and oriented.  height is _0  (1.651 m) and weight is 155 lb 12.8 oz (70.7 kg). Her oral temperature is 98.7 F (37.1 C). Her blood pressure is 132/88 and her pulse is 60. Her respiration is 20 and oxygen saturation is 99%. .   Very subtle residual pigmentation change over the radiation field right chest- skin is intact and is healing well.  Lab Findings: Lab Results  Component Value Date   WBC 4.4 02/19/2018   HGB 11.9 02/19/2018   HCT 37.5 02/19/2018   MCV 83.9 02/19/2018   PLT 217 02/19/2018    _1 @  Radiographic Findings: No results found.  Impression/Plan: This is a very pleasant woman with a history of right breast cancer.  She knows to continue yearly mammography. I  will see her back as needed, advised her to continue with vitamin E lotion to help with healing, pigmentation of right breast. She will continue to follow up with Medical Oncology.  I encouraged her to call if she has any issues in the interim.  _____________________________________   Eppie Gibson, MD  This document serves as a record of services personally performed by Tyler Pita MD. It was created on his behalf by Delton Coombes, a trained medical scribe. The creation of this record is based on the scribe's personal observations and the provider's statements to them.

## 2018-02-26 ENCOUNTER — Telehealth: Payer: Self-pay

## 2018-02-26 MED FILL — FAMOTIDINE 20 MG TABLET: 20 | 30 days supply | Qty: 60 | Fill #0

## 2018-02-26 MED FILL — VENLAFAXINE HCL ER 37.5 MG: 37.5 | 30 days supply | Qty: 30 | Fill #1

## 2018-02-26 MED FILL — LIDOCAINE-PRILOCAINE CREAM: 2.5-2.5 | 30 days supply | Qty: 30 | Fill #0

## 2018-02-26 NOTE — Telephone Encounter (Signed)
Left VM for patient informing her she can have her teeth cleaned and also to see how she is doing with her bone pain. Advised her if it has not improved we would like her to be seen in symptom management or by Dr. Lindi Adie. Call back number provided.  Cyndia Bent RN

## 2018-03-11 ENCOUNTER — Telehealth: Payer: Self-pay | Admitting: Adult Health

## 2018-03-11 ENCOUNTER — Encounter: Payer: Self-pay | Admitting: Adult Health

## 2018-03-11 ENCOUNTER — Inpatient Hospital Stay: Payer: Medicaid Other

## 2018-03-11 ENCOUNTER — Inpatient Hospital Stay: Payer: Medicaid Other | Attending: Hematology and Oncology

## 2018-03-11 ENCOUNTER — Inpatient Hospital Stay (HOSPITAL_BASED_OUTPATIENT_CLINIC_OR_DEPARTMENT_OTHER): Payer: Medicaid Other | Admitting: Adult Health

## 2018-03-11 ENCOUNTER — Other Ambulatory Visit: Payer: Self-pay | Admitting: Adult Health

## 2018-03-11 VITALS — BP 121/86 | HR 61 | Temp 97.7°F | Resp 18 | Ht 65.0 in | Wt 155.4 lb

## 2018-03-11 DIAGNOSIS — K219 Gastro-esophageal reflux disease without esophagitis: Secondary | ICD-10-CM | POA: Insufficient documentation

## 2018-03-11 DIAGNOSIS — R634 Abnormal weight loss: Secondary | ICD-10-CM

## 2018-03-11 DIAGNOSIS — G620 Drug-induced polyneuropathy: Secondary | ICD-10-CM | POA: Diagnosis not present

## 2018-03-11 DIAGNOSIS — T451X5S Adverse effect of antineoplastic and immunosuppressive drugs, sequela: Secondary | ICD-10-CM

## 2018-03-11 DIAGNOSIS — C50411 Malignant neoplasm of upper-outer quadrant of right female breast: Secondary | ICD-10-CM | POA: Insufficient documentation

## 2018-03-11 DIAGNOSIS — Z171 Estrogen receptor negative status [ER-]: Secondary | ICD-10-CM

## 2018-03-11 DIAGNOSIS — M898X9 Other specified disorders of bone, unspecified site: Secondary | ICD-10-CM

## 2018-03-11 DIAGNOSIS — M797 Fibromyalgia: Secondary | ICD-10-CM | POA: Insufficient documentation

## 2018-03-11 DIAGNOSIS — Z87891 Personal history of nicotine dependence: Secondary | ICD-10-CM | POA: Insufficient documentation

## 2018-03-11 DIAGNOSIS — Z923 Personal history of irradiation: Secondary | ICD-10-CM | POA: Insufficient documentation

## 2018-03-11 DIAGNOSIS — Z79899 Other long term (current) drug therapy: Secondary | ICD-10-CM | POA: Diagnosis not present

## 2018-03-11 DIAGNOSIS — R630 Anorexia: Secondary | ICD-10-CM

## 2018-03-11 DIAGNOSIS — Z803 Family history of malignant neoplasm of breast: Secondary | ICD-10-CM

## 2018-03-11 DIAGNOSIS — Z9221 Personal history of antineoplastic chemotherapy: Secondary | ICD-10-CM | POA: Diagnosis not present

## 2018-03-11 DIAGNOSIS — Z5112 Encounter for antineoplastic immunotherapy: Secondary | ICD-10-CM | POA: Diagnosis present

## 2018-03-11 DIAGNOSIS — F1210 Cannabis abuse, uncomplicated: Secondary | ICD-10-CM

## 2018-03-11 DIAGNOSIS — Z95828 Presence of other vascular implants and grafts: Secondary | ICD-10-CM

## 2018-03-11 LAB — CMP (CANCER CENTER ONLY)
ALBUMIN: 4.3 g/dL (ref 3.5–5.0)
ALK PHOS: 91 U/L (ref 40–150)
ALT: 14 U/L (ref 0–55)
ANION GAP: 8 (ref 3–11)
AST: 19 U/L (ref 5–34)
BILIRUBIN TOTAL: 0.3 mg/dL (ref 0.2–1.2)
BUN: 12 mg/dL (ref 7–26)
CO2: 27 mmol/L (ref 22–29)
Calcium: 9.8 mg/dL (ref 8.4–10.4)
Chloride: 106 mmol/L (ref 98–109)
Creatinine: 0.75 mg/dL (ref 0.60–1.10)
GFR, Est AFR Am: >60 mL/min (ref >60)
GFR, Estimated: >60 mL/min (ref >60)
GLUCOSE: 91 mg/dL (ref 70–140)
Potassium: 3.9 mmol/L (ref 3.5–5.1)
SODIUM: 141 mmol/L (ref 136–145)
TOTAL PROTEIN: 7.4 g/dL (ref 6.4–8.3)

## 2018-03-11 LAB — CBC WITH DIFFERENTIAL (CANCER CENTER ONLY)
BASOS ABS: 0 10*3/uL (ref 0.0–0.1)
Basophils Relative: 0 %
EOS PCT: 5 %
Eosinophils Absolute: 0.2 10*3/uL (ref 0.0–0.5)
HEMATOCRIT: 40.2 % (ref 34.8–46.6)
Hemoglobin: 12.7 g/dL (ref 11.6–15.9)
Lymphocytes Relative: 46 %
Lymphs Abs: 2.3 10*3/uL (ref 0.9–3.3)
MCH: 26.2 pg (ref 25.1–34.0)
MCHC: 31.6 g/dL (ref 31.5–36.0)
MCV: 83.1 fL (ref 79.5–101.0)
MONO ABS: 0.4 10*3/uL (ref 0.1–0.9)
MONOS PCT: 7 %
NEUTROS ABS: 2.1 10*3/uL (ref 1.5–6.5)
Neutrophils Relative %: 42 %
PLATELETS: 200 10*3/uL (ref 145–400)
RBC: 4.84 MIL/uL (ref 3.70–5.45)
RDW: 14.3 % (ref 11.2–14.5)
WBC Count: 5 10*3/uL (ref 3.9–10.3)

## 2018-03-11 MED ORDER — ACETAMINOPHEN 325 MG PO TABS
ORAL_TABLET | ORAL | Status: AC
Start: 2018-03-11 — End: ?
  Filled 2018-03-11: qty 2

## 2018-03-11 MED ORDER — SODIUM CHLORIDE 0.9% FLUSH
10.0000 mL | INTRAVENOUS | Status: DC | PRN
  Administered 2018-03-11: 10 mL
  Filled 2018-03-11: qty 10

## 2018-03-11 MED ORDER — SODIUM CHLORIDE 0.9% FLUSH
10.0000 mL | INTRAVENOUS | Status: DC | PRN
  Administered 2018-03-11: 10 mL via INTRAVENOUS
  Filled 2018-03-11: qty 10

## 2018-03-11 MED ORDER — TRAMADOL HCL 50 MG PO TABS
50.0000 mg | ORAL_TABLET | Freq: Once | ORAL | Status: AC
  Administered 2018-03-11: 50 mg via ORAL

## 2018-03-11 MED ORDER — DIPHENHYDRAMINE HCL 25 MG PO CAPS
50.0000 mg | ORAL_CAPSULE | Freq: Once | ORAL | Status: AC
  Administered 2018-03-11: 50 mg via ORAL

## 2018-03-11 MED ORDER — DIPHENHYDRAMINE HCL 25 MG PO CAPS
ORAL_CAPSULE | ORAL | Status: AC
  Filled 2018-03-11: qty 2

## 2018-03-11 MED ORDER — TRAMADOL HCL 50 MG PO TABS
ORAL_TABLET | ORAL | Status: AC
  Filled 2018-03-11: qty 1

## 2018-03-11 MED ORDER — HEPARIN SOD (PORK) LOCK FLUSH 100 UNIT/ML IV SOLN
500.0000 [IU] | Freq: Once | INTRAVENOUS | Status: AC | PRN
  Administered 2018-03-11: 500 [IU]
  Filled 2018-03-11: qty 5

## 2018-03-11 MED ORDER — TRASTUZUMAB CHEMO 150 MG IV SOLR
450.0000 mg | Freq: Once | INTRAVENOUS | Status: AC
  Administered 2018-03-11: 450 mg via INTRAVENOUS
  Filled 2018-03-11: qty 21.43

## 2018-03-11 MED ORDER — SODIUM CHLORIDE 0.9 % IV SOLN
Freq: Once | INTRAVENOUS | Status: AC
  Administered 2018-03-11: 10:00:00 via INTRAVENOUS

## 2018-03-11 MED ORDER — ACETAMINOPHEN 325 MG PO TABS
650.0000 mg | ORAL_TABLET | Freq: Once | ORAL | Status: AC
  Administered 2018-03-11: 650 mg via ORAL

## 2018-03-11 NOTE — Assessment & Plan Note (Signed)
07/24/2017:Right lumpectomy: IDC grade 3, 1.8 cm, 0/2 lymph nodes negative,ER 0%, PR 0%, Ki-67 40%, HER-2 positive ratio 3.59, T1CN0 stage I a  Recommendation: 1. Adjuvant chemotherapy with Taxol Herceptin completed 11/26/2017 followed by Herceptin maintenance for 1 year 2. followed by adjuvant radiation therapy ------------------------------------------------------------------------------------------------------------------------------------ Current treatment: Herceptin maintenance therapy. Adjuvant radiation therapy.  12/19/2017 to 01/15/2018 Because she is ER PR negative there is no role of adjuvant antiestrogen therapy.  Katherine [name] is doing moderately well today.  I reviewed her last echo from 02/07/2018 with her today, and that showed a well preserved ejection fraction.  I also reviewed the fact that she has lost weight.  Due to her unintentional weight loss and generalized bone pain, I have ordered a CT chest, and a bone scan.  She will proceed with Herceptin today.    I did go ahead and renew her parking placard, since she needs a handicapped spot due to the peripheral neuropathy in her feet.  I did this as temporary.  We will call her dentists office and figure out what dental work exactly we need to clear her for.    Katherine [name] will continue to return every three weeks for Herceptin, and will undergo labs and a visit with myself or Dr. Lindi Adie with every other Herceptin treatment.

## 2018-03-11 NOTE — Patient Instructions (Signed)
[facility] Discharge Instructions for Patients Receiving Chemotherapy  Today you received the following chemotherapy agents Herceptin  To help prevent nausea and vomiting after your treatment, we encourage you to take your nausea medication as directed   If you develop nausea and vomiting that is not controlled by your nausea medication, call the clinic.   BELOW ARE SYMPTOMS THAT SHOULD BE REPORTED IMMEDIATELY:  *FEVER GREATER THAN 100.5 F  *CHILLS WITH OR WITHOUT FEVER  NAUSEA AND VOMITING THAT IS NOT CONTROLLED WITH YOUR NAUSEA MEDICATION  *UNUSUAL SHORTNESS OF BREATH  *UNUSUAL BRUISING OR BLEEDING  TENDERNESS IN MOUTH AND THROAT WITH OR WITHOUT PRESENCE OF ULCERS  *URINARY PROBLEMS  *BOWEL PROBLEMS  UNUSUAL RASH Items with * indicate a potential emergency and should be followed up as soon as possible.  Feel free to call the clinic should you have any questions or concerns. The clinic phone number is [phone].  Please show the CHEMO ALERT CARD at check-in to the Emergency Department and triage nurse.   

## 2018-03-11 NOTE — Patient Instructions (Signed)
Implanted Port Home Guide An implanted port is a type of central line that is placed under the skin. Central lines are used to provide IV access when treatment or nutrition needs to be given through a person's veins. Implanted ports are used for long-term IV access. An implanted port may be placed because:  You need IV medicine that would be irritating to the small veins in your hands or arms.  You need long-term IV medicines, such as antibiotics.  You need IV nutrition for a long period.  You need frequent blood draws for lab tests.  You need dialysis.  Implanted ports are usually placed in the chest area, but they can also be placed in the upper arm, the abdomen, or the leg. An implanted port has two main parts:  Reservoir. The reservoir is round and will appear as a small, raised area under your skin. The reservoir is the part where a needle is inserted to give medicines or draw blood.  Catheter. The catheter is a thin, flexible tube that extends from the reservoir. The catheter is placed into a large vein. Medicine that is inserted into the reservoir goes into the catheter and then into the vein.  How will I care for my incision site? Do not get the incision site wet. Bathe or shower as directed by your health care provider. How is my port accessed? Special steps must be taken to access the port:  Before the port is accessed, a numbing cream can be placed on the skin. This helps numb the skin over the port site.  Your health care provider uses a sterile technique to access the port. ? Your health care provider must put on a mask and sterile gloves. ? The skin over your port is cleaned carefully with an antiseptic and allowed to dry. ? The port is gently pinched between sterile gloves, and a needle is inserted into the port.  Only "non-coring" port needles should be used to access the port. Once the port is accessed, a blood return should be checked. This helps ensure that the port  is in the vein and is not clogged.  If your port needs to remain accessed for a constant infusion, a clear (transparent) bandage will be placed over the needle site. The bandage and needle will need to be changed every week, or as directed by your health care provider.  Keep the bandage covering the needle clean and dry. Do not get it wet. Follow your health care provider's instructions on how to take a shower or bath while the port is accessed.  If your port does not need to stay accessed, no bandage is needed over the port.  What is flushing? Flushing helps keep the port from getting clogged. Follow your health care provider's instructions on how and when to flush the port. Ports are usually flushed with saline solution or a medicine called heparin. The need for flushing will depend on how the port is used.  If the port is used for intermittent medicines or blood draws, the port will need to be flushed: ? After medicines have been given. ? After blood has been drawn. ? As part of routine maintenance.  If a constant infusion is running, the port may not need to be flushed.  How long will my port stay implanted? The port can stay in for as long as your health care provider thinks it is needed. When it is time for the port to come out, surgery will be   done to remove it. The procedure is similar to the one performed when the port was put in. When should I seek immediate medical care? When you have an implanted port, you should seek immediate medical care if:  You notice a bad smell coming from the incision site.  You have swelling, redness, or drainage at the incision site.  You have more swelling or pain at the port site or the surrounding area.  You have a fever that is not controlled with medicine.  This information is not intended to replace advice given to you by your health care provider. Make sure you discuss any questions you have with your health care provider. Document  Released: 09/11/2005 Document Revised: 02/17/2016 Document Reviewed: 05/19/2013 Elsevier Interactive Patient Education  2017 [employer]  

## 2018-03-11 NOTE — Progress Notes (Signed)
[facility] Follow up:    Katherine Docker, MD [address] [place] [address]   DIAGNOSIS: Cancer Staging Malignant neoplasm of upper-outer quadrant of right breast in female, estrogen receptor negative ([place]) Staging form: Breast, AJCC 8th Edition - Clinical: Stage IA (cT1c, cN0, cM0, G3, ER: Negative, PR: Negative, HER2: Positive) - Signed by Gardenia Phlegm, NP on 07/04/2017 - Pathologic: Stage IA (pT1c, pN0, cM0, G3, ER: Negative, PR: Negative, HER2: Positive) - Unsigned   SUMMARY OF ONCOLOGIC HISTORY:   Malignant neoplasm of upper-outer quadrant of right breast in female, estrogen receptor negative ([place])   06/26/2017 Initial Diagnosis    Right breast biopsy 8:30 position 8 cm from nipple: IDC with DCIS, second biopsy 6 cm from nipple fibrocystic changes: Grade 3, ER 0%, PR 0%, Ki-67 40%, HER-2 positive ratio 3.59; mammogram and ultrasound revealed 1.9 cm mass at 8:30 position, adjacent 0.7 cm cystic mass, T1c N0 stage IA AJCC 8       07/24/2017 Surgery    Right lumpectomy: IDC grade 3, 1.8 cm, 0/2 lymph nodes negative,ER 0%, PR 0%, Ki-67 40%, HER-2 positive ratio 3.59, T1CN0 stage I a       09/10/2017 - 11/26/2017 Chemotherapy    Taxol Herceptin weekly x12 followed by Herceptin maintenance for 1 year      12/19/2017 - 01/15/2018 Radiation Therapy    Adjuvant radiation therapy       CURRENT THERAPY: Herceptin every 3 weeks  INTERVAL HISTORY: [name] 47 y.o. female returns for evaluation of her breast cancer.  She is experiencing bone pain for about one month.  She has also lost her appetite.  She is down 10 pounds since March.  She is having some pain in her feet.  She says this pain started when she started chemotherapy.  She needs a handicapped placard due to her difficulty with walking due to it.  She is also requesting that we send a note to her dentist clearing her for dental work.  She cannot tell me what kind of  dental work that she is having done.     Patient Active Problem List   Diagnosis Date Noted  . Chemotherapy-induced peripheral neuropathy ([place]) 12/17/2017  . Port-A-Cath in place 09/24/2017  . Malignant neoplasm of upper-outer quadrant of right breast in female, estrogen receptor negative ([place]) 07/03/2017  . Pelvic pain in female 12/21/2016  . Fibroids, intramural 12/21/2016  . Post-operative state 12/19/2016  . Nocturia 08/09/2015  . Fibromyalgia 06/25/2015  . GERD (gastroesophageal reflux disease) 06/16/2013  . Migraine 06/16/2013  . Atrial tachycardia, paroxysmal ([place]) 03/31/2013  . Headache 12/20/2012  . Syncope 06/20/2012  . Fibroids 03/26/2012  . Family history of breast cancer 01/22/2012  . Seasonal and perennial allergic rhinitis 07/18/2010  . HAIR LOSS 07/04/2010  . ANXIETY DISORDER, SITUATIONAL, MILD 11/17/2009  . CHEST PAIN, LEFT 10/12/2009  . DYSURIA 08/26/2009  . MAMMOGRAM, ABNORMAL, RIGHT, HX OF 04/09/2009  . TOBACCO ABUSE 03/17/2009  . DECREASED HEARING, RIGHT EAR 10/27/2008  . BACK PAIN, LUMBAR 06/06/2007  . HOT FLASHES 07/18/2006    is allergic to morphine; shellfish allergy; penicillins; codeine; hydrocodone; and tomato.  MEDICAL HISTORY: Past Medical History:  Diagnosis Date  . Anxiety   . Asthma   . Dysrhythmia    History of SVT-No medications  . Fibromyalgia   . GERD (gastroesophageal reflux disease)   . History of radiation therapy 12/19/17- 01/15/18   Right Breast, 2.67 Gy in  15 fractions for a total dose of 40.05 Gy. Boost, 2 Gy in 5 fractions for a total dose of 10 Gy  . Hot flashes   . Malignant neoplasm of upper-outer quadrant of right female breast ([place]) 06/2017   right breast  . Migraine   . Palpitations   . Pneumonia    with cavitation of left lower lobe  . Tuberculosis    exposure as a child, get checked frequently  . Uterine fibroid     SURGICAL HISTORY: Past Surgical History:  Procedure Laterality Date  . ABDOMINAL  HYSTERECTOMY    . BREAST LUMPECTOMY WITH RADIOACTIVE SEED AND SENTINEL LYMPH NODE BIOPSY Right 07/24/2017   Procedure: RIGHT BREAST LUMPECTOMY WITH RADIOACTIVE SEED AND SENTINEL LYMPH NODE BIOPSY;  Surgeon: Erroll Luna, MD;  Location: [facility];  Service: General;  Laterality: Right;  . PORTACATH PLACEMENT Right 07/24/2017   Procedure: INSERTION PORT-A-CATH;  Surgeon: Erroll Luna, MD;  Location: [facility];  Service: General;  Laterality: Right;  . TUBAL LIGATION  1993  . VAGINAL HYSTERECTOMY Bilateral 12/19/2016   Procedure: HYSTERECTOMY VAGINAL;  Surgeon: Lavonia Drafts, MD;  Location: [facility];  Service: Gynecology;  Laterality: Bilateral;  . WISDOM TOOTH EXTRACTION      SOCIAL HISTORY: Social History   Socioeconomic History  . Marital status: Married    Spouse name: Not on file  . Number of children: 2  . Years of education: 78  . Highest education level: Not on file  Occupational History  . Occupation: unemployed  Social Needs  . Financial resource strain: Not on file  . Food insecurity:    Worry: Not on file    Inability: Not on file  . Transportation needs:    Medical: Not on file    Non-medical: Not on file  Tobacco Use  . Smoking status: Former Smoker    Packs/day: 0.50    Types: Cigarettes    Last attempt to quit: 07/16/2017    Years since quitting: 0.6  . Smokeless tobacco: Never Used  . Tobacco comment: smokes 1cigarette per day and more if she is stressed  Substance and Sexual Activity  . Alcohol use: No    Alcohol/week: 0.0 oz  . Drug use: Yes    Types: Marijuana    Comment: occasional, for pain. states she hasnt used for a mont; 08/02/17 sometimes  . Sexual activity: Yes    Birth control/protection: Surgical  Lifestyle  . Physical activity:    Days per week: Not on file    Minutes per session: Not on file  . Stress: Not on file  Relationships  . Social connections:    Talks on phone: Not on file    Gets  together: Not on file    Attends religious service: Not on file    Active member of club or organization: Not on file    Attends meetings of clubs or organizations: Not on file    Relationship status: Not on file  . Intimate partner violence:    Fear of current or ex partner: Not on file    Emotionally abused: Not on file    Physically abused: Not on file    Forced sexual activity: Not on file  Other Topics Concern  . Not on file  Social History Narrative   On disability for heart condition.  Pt. Was non-specific.       Patient does not drink caffeine.   Patient is ambidextrous, but mostly right handed.  12th grade, some college courses    FAMILY HISTORY: Family History  Problem Relation Age of Onset  . Migraines Daughter   . Hypertension Mother   . Heart disease Mother   . Hypertension Father   . Heart disease Father   . Breast cancer Sister 64       deceased at age 48  . Migraines Son   . Hypertension Unknown   . Colon cancer Neg Hx   . Colon polyps Neg Hx   . Diabetes Neg Hx   . Kidney disease Neg Hx   . Liver disease Neg Hx   . Heart attack Neg Hx   . Stroke Neg Hx     Review of Systems  Constitutional: Negative for appetite change, chills, fatigue and unexpected weight change.  HENT:   Negative for hearing loss, lump/mass and trouble swallowing.   Eyes: Negative for eye problems and icterus.  Respiratory: Negative for chest tightness, cough and shortness of breath.   Cardiovascular: Negative for chest pain, leg swelling and palpitations.  Gastrointestinal: Negative for abdominal distention, abdominal pain, constipation, diarrhea, nausea and vomiting.  Endocrine: Negative for hot flashes.  Skin: Negative for itching and rash.  Neurological: Negative for dizziness, extremity weakness, headaches and numbness.  Hematological: Negative for adenopathy. Does not bruise/bleed easily.  Psychiatric/Behavioral: Negative for depression. The patient is not  nervous/anxious.       PHYSICAL EXAMINATION  ECOG PERFORMANCE STATUS: 1 - Symptomatic but completely ambulatory  Vitals:   03/11/18 0904  BP: 121/86  Pulse: 61  Resp: 18  Temp: 97.7 F (36.5 C)  SpO2: 99%    Physical Exam  Constitutional: She is oriented to person, place, and time. She appears well-developed and well-nourished.  HENT:  Head: Normocephalic and atraumatic.  Mouth/Throat: Oropharynx is clear and moist. No oropharyngeal exudate.  Eyes: Pupils are equal, round, and reactive to light. No scleral icterus.  Neck: Neck supple.  Cardiovascular: Normal rate, regular rhythm and normal heart sounds.  Pulmonary/Chest: Effort normal and breath sounds normal.  Abdominal: Soft. Bowel sounds are normal.  Lymphadenopathy:    She has no cervical adenopathy.  Neurological: She is alert and oriented to person, place, and time.  Skin: Skin is warm and dry. Capillary refill takes less than 2 seconds. No rash noted.  Psychiatric: She has a normal mood and affect.    LABORATORY DATA:  CBC    Component Value Date/Time   WBC 5.0 03/11/2018 0802   WBC 6.0 11/26/2017 0812   RBC 4.84 03/11/2018 0802   HGB 12.7 03/11/2018 0802   HGB 11.5 (L) 09/24/2017 1024   HCT 40.2 03/11/2018 0802   HCT 34.5 (L) 09/24/2017 1024   PLT 200 03/11/2018 0802   PLT 202 09/24/2017 1024   MCV 83.1 03/11/2018 0802   MCV 81.6 09/24/2017 1024   MCH 26.2 03/11/2018 0802   MCHC 31.6 03/11/2018 0802   RDW 14.3 03/11/2018 0802   RDW 14.6 (H) 09/24/2017 1024   LYMPHSABS 2.3 03/11/2018 0802   LYMPHSABS 2.4 09/24/2017 1024   MONOABS 0.4 03/11/2018 0802   MONOABS 0.3 09/24/2017 1024   EOSABS 0.2 03/11/2018 0802   EOSABS 0.1 09/24/2017 1024   BASOSABS 0.0 03/11/2018 0802   BASOSABS 0.0 09/24/2017 1024    CMP     Component Value Date/Time   NA 141 03/11/2018 0802   NA 140 09/24/2017 1023   K 3.9 03/11/2018 0802   K 3.8 09/24/2017 1023   CL 106 03/11/2018 0802  CO2 27 03/11/2018 0802   CO2 24  09/24/2017 1023   GLUCOSE 91 03/11/2018 0802   GLUCOSE 104 09/24/2017 1023   BUN 12 03/11/2018 0802   BUN 11.7 09/24/2017 1023   CREATININE 0.75 03/11/2018 0802   CREATININE 0.7 09/24/2017 1023   CALCIUM 9.8 03/11/2018 0802   CALCIUM 9.0 09/24/2017 1023   PROT 7.4 03/11/2018 0802   PROT 7.2 09/24/2017 1023   ALBUMIN 4.3 03/11/2018 0802   ALBUMIN 4.2 09/24/2017 1023   AST 19 03/11/2018 0802   AST 25 09/24/2017 1023   ALT 14 03/11/2018 0802   ALT 39 09/24/2017 1023   ALKPHOS 91 03/11/2018 0802   ALKPHOS 81 09/24/2017 1023   BILITOT 0.3 03/11/2018 0802   BILITOT 0.41 09/24/2017 1023   GFRNONAA >60 03/11/2018 0802   GFRNONAA >89 09/05/2013 1459   GFRAA >60 03/11/2018 0802   GFRAA >89 09/05/2013 1459           ASSESSMENT and PLAN:   Malignant neoplasm of upper-outer quadrant of right breast in female, estrogen receptor negative ([place]) 07/24/2017:Right lumpectomy: IDC grade 3, 1.8 cm, 0/2 lymph nodes negative,ER 0%, PR 0%, Ki-67 40%, HER-2 positive ratio 3.59, T1CN0 stage I a  Recommendation: 1. Adjuvant chemotherapy with Taxol Herceptin completed 11/26/2017 followed by Herceptin maintenance for 1 year 2. followed by adjuvant radiation therapy ------------------------------------------------------------------------------------------------------------------------------------ Current treatment: Herceptin maintenance therapy. Adjuvant radiation therapy.  12/19/2017 to 01/15/2018 Because she is ER PR negative there is no role of adjuvant antiestrogen therapy.  Kersten is doing moderately well today.  I reviewed her last echo from 02/07/2018 with her today, and that showed a well preserved ejection fraction.  I also reviewed the fact that she has lost weight.  Due to her unintentional weight loss and generalized bone pain, I have ordered a CT chest, and a bone scan.  She will proceed with Herceptin today.    I did go ahead and renew her parking placard, since she needs a handicapped  spot due to the peripheral neuropathy in her feet.  I did this as temporary.  We will call her dentists office and figure out what dental work exactly we need to clear her for.    Nazli will continue to return every three weeks for Herceptin, and will undergo labs and a visit with myself or Dr. Lindi Adie with every other Herceptin treatment.       Orders Placed This Encounter  Procedures  . NM Bone Scan Whole Body    Standing Status:   Future    Standing Expiration Date:   03/11/2019    Order Specific Question:   If indicated for the ordered procedure, I authorize the administration of a radiopharmaceutical per Radiology protocol    Answer:   Yes    Order Specific Question:   Is the patient pregnant?    Answer:   No    Order Specific Question:   Preferred imaging location?    Answer:   [facility]    Order Specific Question:   Radiology Contrast Protocol - do NOT remove file path    Answer:   [email or web address]  . CT Chest W Contrast    Standing Status:   Future    Standing Expiration Date:   03/11/2019    Order Specific Question:   If indicated for the ordered procedure, I authorize the administration of contrast media per Radiology protocol    Answer:   Yes    Order Specific Question:   Is patient  pregnant?    Answer:   No    Order Specific Question:   Preferred imaging location?    Answer:   [facility]    Order Specific Question:   Radiology Contrast Protocol - do NOT remove file path    Answer:   \\[email or web address]    All questions were answered. The patient knows to call the clinic with any problems, questions or concerns. We can certainly see the patient much sooner if necessary.  A total of (30) minutes of face-to-face time was spent with this patient with greater than 50% of that time in counseling and care-coordination.  This note was electronically signed. Scot Dock, NP 03/11/2018

## 2018-03-11 NOTE — Telephone Encounter (Signed)
No LOS 6/17

## 2018-03-25 ENCOUNTER — Encounter (HOSPITAL_COMMUNITY): Payer: Self-pay

## 2018-03-25 ENCOUNTER — Ambulatory Visit (HOSPITAL_COMMUNITY)
Admission: RE | Admit: 2018-03-25 | Discharge: 2018-03-25 | Disposition: A | Discharge status: Home / Self Care | Payer: Medicaid Other | Source: Ambulatory Visit | Attending: Adult Health | Admitting: Adult Health

## 2018-03-25 DIAGNOSIS — C50411 Malignant neoplasm of upper-outer quadrant of right female breast: Secondary | ICD-10-CM

## 2018-03-25 DIAGNOSIS — Y842 Radiological procedure and radiotherapy as the cause of abnormal reaction of the patient, or of later complication, without mention of misadventure at the time of the procedure: Secondary | ICD-10-CM | POA: Insufficient documentation

## 2018-03-25 DIAGNOSIS — Z171 Estrogen receptor negative status [ER-]: Secondary | ICD-10-CM | POA: Insufficient documentation

## 2018-03-25 MED ORDER — TECHNETIUM TC 99M MEDRONATE IV KIT
20.0000 | PACK | Freq: Once | INTRAVENOUS | Status: AC | PRN
  Administered 2018-03-25: 20 via INTRAVENOUS

## 2018-03-25 MED ORDER — HEPARIN SOD (PORK) LOCK FLUSH 100 UNIT/ML IV SOLN
INTRAVENOUS | Status: AC
  Filled 2018-03-25: qty 5

## 2018-03-25 MED ORDER — HEPARIN SOD (PORK) LOCK FLUSH 100 UNIT/ML IV SOLN
500.0000 [IU] | Freq: Once | INTRAVENOUS | Status: AC
  Administered 2018-03-25: 500 [IU] via INTRAVENOUS

## 2018-03-25 MED ORDER — IOHEXOL 300 MG/ML  SOLN
75.0000 mL | Freq: Once | INTRAMUSCULAR | Status: AC | PRN
  Administered 2018-03-25: 75 mL via INTRAVENOUS

## 2018-03-26 ENCOUNTER — Telehealth: Payer: Self-pay

## 2018-03-26 ENCOUNTER — Telehealth: Payer: Self-pay | Admitting: Hematology and Oncology

## 2018-03-26 NOTE — Telephone Encounter (Signed)
Informed the patient that the CT scans and bone scans were normal

## 2018-03-26 NOTE — Telephone Encounter (Signed)
-----   Message from Gardenia Phlegm, NP sent at 03/26/2018 11:38 AM EDT ----- Please review scans with patient.  No cancer related cause for pain.  Please let her know that Dr. Lindi Adie will review in further detail when she sees him later this month.  ----- Message ----- From: Interface, Rad Results In Sent: 03/25/2018  10:22 AM To: Gardenia Phlegm, NP

## 2018-03-26 NOTE — Telephone Encounter (Signed)
LVM for patient to call back so we can give her chest CT results.

## 2018-04-01 ENCOUNTER — Inpatient Hospital Stay: Payer: Medicaid Other | Attending: Hematology and Oncology

## 2018-04-01 VITALS — BP 125/93 | HR 67 | Temp 98.1°F | Resp 17 | Wt 152.0 lb

## 2018-04-01 DIAGNOSIS — C50411 Malignant neoplasm of upper-outer quadrant of right female breast: Secondary | ICD-10-CM | POA: Diagnosis not present

## 2018-04-01 DIAGNOSIS — M898X9 Other specified disorders of bone, unspecified site: Secondary | ICD-10-CM | POA: Diagnosis not present

## 2018-04-01 DIAGNOSIS — R634 Abnormal weight loss: Secondary | ICD-10-CM | POA: Insufficient documentation

## 2018-04-01 DIAGNOSIS — Z5112 Encounter for antineoplastic immunotherapy: Secondary | ICD-10-CM | POA: Diagnosis not present

## 2018-04-01 DIAGNOSIS — Z923 Personal history of irradiation: Secondary | ICD-10-CM | POA: Insufficient documentation

## 2018-04-01 DIAGNOSIS — Z9221 Personal history of antineoplastic chemotherapy: Secondary | ICD-10-CM | POA: Insufficient documentation

## 2018-04-01 DIAGNOSIS — Z79899 Other long term (current) drug therapy: Secondary | ICD-10-CM | POA: Insufficient documentation

## 2018-04-01 DIAGNOSIS — Z171 Estrogen receptor negative status [ER-]: Secondary | ICD-10-CM | POA: Diagnosis not present

## 2018-04-01 MED ORDER — DIPHENHYDRAMINE HCL 25 MG PO CAPS
50.0000 mg | ORAL_CAPSULE | Freq: Once | ORAL | Status: AC
  Administered 2018-04-01: 50 mg via ORAL

## 2018-04-01 MED ORDER — SODIUM CHLORIDE 0.9 % IV SOLN
450.0000 mg | Freq: Once | INTRAVENOUS | Status: AC
  Administered 2018-04-01: 450 mg via INTRAVENOUS
  Filled 2018-04-01: qty 21.43

## 2018-04-01 MED ORDER — SODIUM CHLORIDE 0.9% FLUSH
10.0000 mL | INTRAVENOUS | Status: DC | PRN
  Administered 2018-04-01: 10 mL
  Filled 2018-04-01: qty 10

## 2018-04-01 MED ORDER — DIPHENHYDRAMINE HCL 25 MG PO CAPS
ORAL_CAPSULE | ORAL | Status: AC
  Filled 2018-04-01: qty 2

## 2018-04-01 MED ORDER — ACETAMINOPHEN 325 MG PO TABS
ORAL_TABLET | ORAL | Status: AC
  Filled 2018-04-01: qty 2

## 2018-04-01 MED ORDER — ACETAMINOPHEN 325 MG PO TABS
650.0000 mg | ORAL_TABLET | Freq: Once | ORAL | Status: AC
  Administered 2018-04-01: 650 mg via ORAL

## 2018-04-01 MED ORDER — SODIUM CHLORIDE 0.9 % IV SOLN
Freq: Once | INTRAVENOUS | Status: AC
  Administered 2018-04-01: 09:00:00 via INTRAVENOUS

## 2018-04-01 MED ORDER — HEPARIN SOD (PORK) LOCK FLUSH 100 UNIT/ML IV SOLN
500.0000 [IU] | Freq: Once | INTRAVENOUS | Status: AC | PRN
  Administered 2018-04-01: 500 [IU]
  Filled 2018-04-01: qty 5

## 2018-04-01 NOTE — Patient Instructions (Signed)
[facility] Discharge Instructions for Patients Receiving Chemotherapy  Today you received the following chemotherapy agents Heparin  To help prevent nausea and vomiting after your treatment, we encourage you to take your nausea medication as directed   If you develop nausea and vomiting that is not controlled by your nausea medication, call the clinic.   BELOW ARE SYMPTOMS THAT SHOULD BE REPORTED IMMEDIATELY:  *FEVER GREATER THAN 100.5 F  *CHILLS WITH OR WITHOUT FEVER  NAUSEA AND VOMITING THAT IS NOT CONTROLLED WITH YOUR NAUSEA MEDICATION  *UNUSUAL SHORTNESS OF BREATH  *UNUSUAL BRUISING OR BLEEDING  TENDERNESS IN MOUTH AND THROAT WITH OR WITHOUT PRESENCE OF ULCERS  *URINARY PROBLEMS  *BOWEL PROBLEMS  UNUSUAL RASH Items with * indicate a potential emergency and should be followed up as soon as possible.  Feel free to call the clinic should you have any questions or concerns. The clinic phone number is [phone].  Please show the [place] at check-in to the Emergency Department and triage nurse.

## 2018-04-22 ENCOUNTER — Inpatient Hospital Stay: Payer: Medicaid Other

## 2018-04-22 ENCOUNTER — Inpatient Hospital Stay (HOSPITAL_BASED_OUTPATIENT_CLINIC_OR_DEPARTMENT_OTHER): Payer: Medicaid Other | Admitting: Hematology and Oncology

## 2018-04-22 ENCOUNTER — Telehealth: Payer: Self-pay | Admitting: Hematology and Oncology

## 2018-04-22 ENCOUNTER — Encounter: Payer: Self-pay | Admitting: *Deleted

## 2018-04-22 DIAGNOSIS — C50411 Malignant neoplasm of upper-outer quadrant of right female breast: Secondary | ICD-10-CM

## 2018-04-22 DIAGNOSIS — Z171 Estrogen receptor negative status [ER-]: Principal | ICD-10-CM

## 2018-04-22 DIAGNOSIS — Z9221 Personal history of antineoplastic chemotherapy: Secondary | ICD-10-CM

## 2018-04-22 DIAGNOSIS — R634 Abnormal weight loss: Secondary | ICD-10-CM

## 2018-04-22 DIAGNOSIS — Z803 Family history of malignant neoplasm of breast: Secondary | ICD-10-CM

## 2018-04-22 DIAGNOSIS — Z95828 Presence of other vascular implants and grafts: Secondary | ICD-10-CM

## 2018-04-22 DIAGNOSIS — Z5112 Encounter for antineoplastic immunotherapy: Secondary | ICD-10-CM | POA: Diagnosis not present

## 2018-04-22 DIAGNOSIS — Z923 Personal history of irradiation: Secondary | ICD-10-CM | POA: Diagnosis not present

## 2018-04-22 DIAGNOSIS — Z79899 Other long term (current) drug therapy: Secondary | ICD-10-CM

## 2018-04-22 DIAGNOSIS — M898X9 Other specified disorders of bone, unspecified site: Secondary | ICD-10-CM

## 2018-04-22 LAB — CBC WITH DIFFERENTIAL (CANCER CENTER ONLY)
BASOS ABS: 0 10*3/uL (ref 0.0–0.1)
Basophils Relative: 1 %
EOS ABS: 0.2 10*3/uL (ref 0.0–0.5)
EOS PCT: 6 %
HCT: 35.8 % (ref 34.8–46.6)
Hemoglobin: 11.7 g/dL (ref 11.6–15.9)
Lymphocytes Relative: 38 %
Lymphs Abs: 1.5 10*3/uL (ref 0.9–3.3)
MCH: 26.2 pg (ref 25.1–34.0)
MCHC: 32.7 g/dL (ref 31.5–36.0)
MCV: 80 fL (ref 79.5–101.0)
Monocytes Absolute: 0.3 10*3/uL (ref 0.1–0.9)
Monocytes Relative: 8 %
Neutro Abs: 1.9 10*3/uL (ref 1.5–6.5)
Neutrophils Relative %: 47 %
Platelet Count: 206 10*3/uL (ref 145–400)
RBC: 4.48 MIL/uL (ref 3.70–5.45)
RDW: 15.1 % — ABNORMAL HIGH (ref 11.2–14.5)
WBC: 3.9 10*3/uL (ref 3.9–10.3)

## 2018-04-22 LAB — CMP (CANCER CENTER ONLY)
ALBUMIN: 4.1 g/dL (ref 3.5–5.0)
ALT: 12 U/L (ref 0–44)
AST: 15 U/L (ref 15–41)
Alkaline Phosphatase: 76 U/L (ref 38–126)
Anion gap: 8 (ref 5–15)
BILIRUBIN TOTAL: 0.4 mg/dL (ref 0.3–1.2)
BUN: 15 mg/dL (ref 6–20)
CO2: 28 mmol/L (ref 22–32)
CREATININE: 0.81 mg/dL (ref 0.44–1.00)
Calcium: 9.5 mg/dL (ref 8.9–10.3)
Chloride: 108 mmol/L (ref 98–111)
GFR, Est AFR Am: >60 mL/min (ref >60)
Glucose, Bld: 97 mg/dL (ref 70–99)
Potassium: 3.7 mmol/L (ref 3.5–5.1)
Sodium: 144 mmol/L (ref 135–145)
TOTAL PROTEIN: 7 g/dL (ref 6.5–8.1)

## 2018-04-22 MED ORDER — HEPARIN SOD (PORK) LOCK FLUSH 100 UNIT/ML IV SOLN
500.0000 [IU] | Freq: Once | INTRAVENOUS | Status: AC | PRN
  Administered 2018-04-22: 500 [IU]
  Filled 2018-04-22: qty 5

## 2018-04-22 MED ORDER — ACETAMINOPHEN 325 MG PO TABS
ORAL_TABLET | ORAL | Status: AC
  Filled 2018-04-22: qty 2

## 2018-04-22 MED ORDER — DIPHENHYDRAMINE HCL 25 MG PO CAPS
50.0000 mg | ORAL_CAPSULE | Freq: Once | ORAL | Status: AC
  Administered 2018-04-22: 50 mg via ORAL

## 2018-04-22 MED ORDER — SODIUM CHLORIDE 0.9% FLUSH
10.0000 mL | INTRAVENOUS | Status: DC | PRN
  Administered 2018-04-22: 10 mL
  Filled 2018-04-22: qty 10

## 2018-04-22 MED ORDER — SODIUM CHLORIDE 0.9% FLUSH
10.0000 mL | INTRAVENOUS | Status: DC | PRN
  Administered 2018-04-22: 10 mL via INTRAVENOUS
  Filled 2018-04-22: qty 10

## 2018-04-22 MED ORDER — DIPHENHYDRAMINE HCL 25 MG PO CAPS
ORAL_CAPSULE | ORAL | Status: AC
  Filled 2018-04-22: qty 2

## 2018-04-22 MED ORDER — TRASTUZUMAB CHEMO 150 MG IV SOLR
450.0000 mg | Freq: Once | INTRAVENOUS | Status: AC
  Administered 2018-04-22: 450 mg via INTRAVENOUS
  Filled 2018-04-22: qty 21.43

## 2018-04-22 MED ORDER — ACETAMINOPHEN 325 MG PO TABS
650.0000 mg | ORAL_TABLET | Freq: Once | ORAL | Status: AC
  Administered 2018-04-22: 650 mg via ORAL

## 2018-04-22 MED ORDER — SODIUM CHLORIDE 0.9 % IV SOLN
Freq: Once | INTRAVENOUS | Status: AC
  Administered 2018-04-22: 10:00:00 via INTRAVENOUS
  Filled 2018-04-22: qty 250

## 2018-04-22 NOTE — Patient Instructions (Signed)
[facility] Discharge Instructions for Patients Receiving Chemotherapy  Today you received the following chemotherapy agents Herceptin  To help prevent nausea and vomiting after your treatment, we encourage you to take your nausea medication as directed   If you develop nausea and vomiting that is not controlled by your nausea medication, call the clinic.   BELOW ARE SYMPTOMS THAT SHOULD BE REPORTED IMMEDIATELY:  *FEVER GREATER THAN 100.5 F  *CHILLS WITH OR WITHOUT FEVER  NAUSEA AND VOMITING THAT IS NOT CONTROLLED WITH YOUR NAUSEA MEDICATION  *UNUSUAL SHORTNESS OF BREATH  *UNUSUAL BRUISING OR BLEEDING  TENDERNESS IN MOUTH AND THROAT WITH OR WITHOUT PRESENCE OF ULCERS  *URINARY PROBLEMS  *BOWEL PROBLEMS  UNUSUAL RASH Items with * indicate a potential emergency and should be followed up as soon as possible.  Feel free to call the clinic should you have any questions or concerns. The clinic phone number is [phone].  Please show the CHEMO ALERT CARD at check-in to the Emergency Department and triage nurse.   

## 2018-04-22 NOTE — Telephone Encounter (Signed)
Gave patient avs and calendar of upcoming aug appts.  °

## 2018-04-22 NOTE — Progress Notes (Signed)
Patient Care Team: Javier Docker, MD as PCP - General (Internal Medicine) Erroll Luna, MD as Consulting Physician (General Surgery)  DIAGNOSIS:  Encounter Diagnosis  Name Primary?  . Malignant neoplasm of upper-outer quadrant of right breast in female, estrogen receptor negative ([place])     SUMMARY OF ONCOLOGIC HISTORY:   Malignant neoplasm of upper-outer quadrant of right breast in female, estrogen receptor negative ([place])   06/26/2017 Initial Diagnosis    Right breast biopsy 8:30 position 8 cm from nipple: IDC with DCIS, second biopsy 6 cm from nipple fibrocystic changes: Grade 3, ER 0%, PR 0%, Ki-67 40%, HER-2 positive ratio 3.59; mammogram and ultrasound revealed 1.9 cm mass at 8:30 position, adjacent 0.7 cm cystic mass, T1c N0 stage IA AJCC 8       07/24/2017 Surgery    Right lumpectomy: IDC grade 3, 1.8 cm, 0/2 lymph nodes negative,ER 0%, PR 0%, Ki-67 40%, HER-2 positive ratio 3.59, T1CN0 stage I a       09/10/2017 - 11/26/2017 Chemotherapy    Taxol Herceptin weekly x12 followed by Herceptin maintenance for 1 year      12/19/2017 - 01/15/2018 Radiation Therapy    Adjuvant radiation therapy       CHIEF COMPLIANT: Follow-up on Herceptin therapy  INTERVAL HISTORY: Katherine [name] is a 47 year old with above-mentioned history of right breast cancer treated with adjuvant chemotherapy with Taxol Herceptin and radiation.  She is currently on Herceptin maintenance.  She appears to be tolerating it extremely well.  She does not have any side effects with the treatment.  She occasionally has palpitations and is planning to see cardiology for that.  Her most recent echocardiogram was done in [place] which was normal.  REVIEW OF SYSTEMS:   Constitutional: Denies fevers, chills or abnormal weight loss Eyes: Denies blurriness of vision Ears, nose, mouth, throat, and face: Denies mucositis or sore throat Respiratory: Denies cough, dyspnea or wheezes Cardiovascular: Intermittent  palpitations Gastrointestinal:  Denies nausea, heartburn or change in bowel habits Skin: Denies abnormal skin rashes Lymphatics: Denies new lymphadenopathy or easy bruising Neurological:Denies numbness, tingling or new weaknesses Behavioral/Psych: Mood is stable, no new changes  Extremities: No lower extremity edema All other systems were reviewed with the patient and are negative.  I have reviewed the past medical history, past surgical history, social history and family history with the patient and they are unchanged from previous note.  ALLERGIES:  is allergic to morphine; shellfish allergy; penicillins; codeine; hydrocodone; and tomato.  MEDICATIONS:  Current Outpatient Medications  Medication Sig Dispense Refill  . albuterol (PROVENTIL HFA;VENTOLIN HFA) 108 (90 Base) MCG/ACT inhaler Inhale 2 puffs into the lungs every 6 (six) hours as needed for wheezing or shortness of breath.    . diphenoxylate-atropine (LOMOTIL) 2.5-0.025 MG tablet TAKE 1 TABLET BY MOUTH 4 TIMES DAILY AS NEEDED FOR DIARRHEA OR LOOSE STOOLS 30 tablet 0  . famotidine (PEPCID) 20 MG tablet Take 1 tablet (20 mg total) by mouth 2 (two) times daily. 90 tablet 3  . ibuprofen (ADVIL,MOTRIN) 600 MG tablet Take 1 tablet (600 mg total) by mouth every 6 (six) hours as needed. 30 tablet 0  . ipratropium (ATROVENT) 0.03 % nasal spray SPRAY TWO SQUIRTS INTO EACH NOSTRIL TWICE A DAY  1  . lidocaine-prilocaine (EMLA) cream Apply to affected area once 30 g 3  . pantoprazole (PROTONIX) 40 MG tablet Take 40 mg by mouth daily.    Marland Kitchen venlafaxine XR (EFFEXOR-XR) 37.5 MG 24 hr capsule Take 1 capsule (  37.5 mg total) by mouth at bedtime. 30 capsule 3   No current facility-administered medications for this visit.     PHYSICAL EXAMINATION: ECOG PERFORMANCE STATUS: 1 - Symptomatic but completely ambulatory  Vitals:   04/22/18 0901  BP: (!) 133/91  Pulse: 65  Resp: 19  Temp: 98.6 F (37 C)  SpO2: 100%   Filed Weights   04/22/18  0901  Weight: 150 lb 1.6 oz (68.1 kg)    GENERAL:alert, no distress and comfortable SKIN: skin color, texture, turgor are normal, no rashes or significant lesions EYES: normal, Conjunctiva are pink and non-injected, sclera clear OROPHARYNX:no exudate, no erythema and lips, buccal mucosa, and tongue normal  NECK: supple, thyroid normal size, non-tender, without nodularity LYMPH:  no palpable lymphadenopathy in the cervical, axillary or inguinal LUNGS: clear to auscultation and percussion with normal breathing effort HEART: regular rate & rhythm and no murmurs and no lower extremity edema ABDOMEN:abdomen soft, non-tender and normal bowel sounds MUSCULOSKELETAL:no cyanosis of digits and no clubbing  NEURO: alert & oriented x 3 with fluent speech, no focal motor/sensory deficits EXTREMITIES: No lower extremity edema   LABORATORY DATA:  I have reviewed the data as listed CMP Latest Ref Rng & Units 03/11/2018 02/19/2018 01/28/2018  Glucose 70 - 140 mg/dL 91 94 90  BUN 7 - 26 mg/dL _0 Creatinine 0.60 - 1.10 mg/dL 0.75 0.63 0.70  Sodium 136 - 145 mmol/L 141 141 140  Potassium 3.5 - 5.1 mmol/L 3.9 3.5 3.6  Chloride 98 - 109 mmol/L 106 106 109  CO2 22 - 29 mmol/L _1 Calcium 8.4 - 10.4 mg/dL 9.8 8.8(L) 8.9  Total Protein 6.4 - 8.3 g/dL 7.4 7.3 6.6  Total Bilirubin 0.2 - 1.2 mg/dL 0.3 0.1(L) 0.2  Alkaline Phos 40 - 150 U/L 91 77 83  AST 5 - 34 U/L _2 ALT 0 - 55 U/L 14 12(L) 9    Lab Results  Component Value Date   WBC 3.9 04/22/2018   HGB 11.7 04/22/2018   HCT 35.8 04/22/2018   MCV 80.0 04/22/2018   PLT 206 04/22/2018   NEUTROABS 1.9 04/22/2018    ASSESSMENT & PLAN:  Malignant neoplasm of upper-outer quadrant of right breast in female, estrogen receptor negative ([facility]) 07/24/2017:Right lumpectomy: IDC grade 3, 1.8 cm, 0/2 lymph nodes negative,ER 0%, PR 0%, Ki-67 40%, HER-2 positive ratio 3.59, T1CN0 stage I a  Recommendation: 1. Adjuvant chemotherapy with Taxol  Herceptin completed 3/4/2019followed by Herceptin maintenance for 1 year 2. followed by adjuvant radiation therapy ------------------------------------------------------------------------------------------------------------------------------------ Current treatment:Herceptin maintenance therapy. (Will complete December 2019) Adjuvant radiation therapy.  12/19/2017 to 01/15/2018 Because she is ER PR negative there is no role of adjuvant antiestrogen therapy.  Unintentional weight loss and generalized bone pain: CT scans and bone scans did not show any evidence of metastatic disease.  Return to clinic every 3 weeks for Herceptin every 6 weeks for follow-up with me    No orders of the defined types were placed in this encounter.  The patient has a good understanding of the overall plan. she agrees with it. she will call with any problems that may develop before the next visit here.   Harriette Ohara, MD 04/22/18

## 2018-04-22 NOTE — Progress Notes (Unsigned)
[facility] Clinical Social Work  [employer] representative received referral from Medical Oncology for financial  assistance and resources.  CSW met with patient in the infusion room to offer support and assess for needs.  Patient expressed concern for her utility bill and not having enough income to cover her mortgage and utility bill.  Patient has used all of her fund from the [employer].  Patient in not currently working but has been receiving disability for over 5 years.  CSW provided patient with information on Cancer Care and instructions for initiating the application process.  CSW informed patient that because of eligibility requirements, financial resources were limited.  Patient is familiar with DSS emergency assistance and plans to apply for assistance.  CSW will continue to research additional community resources and share with patient.  Johnnye Lana, MSW, LCSW, OSW-C Clinical Social Worker [facility] [phone]

## 2018-04-22 NOTE — Assessment & Plan Note (Signed)
07/24/2017:Right lumpectomy: IDC grade 3, 1.8 cm, 0/2 lymph nodes negative,ER 0%, PR 0%, Ki-67 40%, HER-2 positive ratio 3.59, T1CN0 stage I a  Recommendation: 1. Adjuvant chemotherapy with Taxol Herceptin completed 3/4/2019followed by Herceptin maintenance for 1 year 2. followed by adjuvant radiation therapy ------------------------------------------------------------------------------------------------------------------------------------ Current treatment:Herceptin maintenance therapy. (Will complete December 2019) Adjuvant radiation therapy.  12/19/2017 to 01/15/2018 Because she is ER PR negative there is no role of adjuvant antiestrogen therapy.  Unintentional weight loss and generalized bone pain: CT scans and bone scans did not show any evidence of metastatic disease.  Return to clinic every 3 weeks for Herceptin every 6 weeks for follow-up with me 

## 2018-04-30 ENCOUNTER — Encounter: Payer: Self-pay | Admitting: *Deleted

## 2018-04-30 NOTE — Progress Notes (Signed)
Completed paperwork for Cancer Care application and faxed on patient's behalf.  Maryjean Morn, MSW, LCSW, OSW-C Clinical Social Worker [facility] [phone]

## 2018-05-13 ENCOUNTER — Inpatient Hospital Stay: Payer: Medicaid Other | Attending: Hematology and Oncology

## 2018-05-13 ENCOUNTER — Telehealth: Payer: Self-pay

## 2018-05-13 ENCOUNTER — Other Ambulatory Visit: Payer: Self-pay

## 2018-05-13 VITALS — BP 125/91 | HR 78 | Temp 99.0°F | Resp 17 | Wt 152.5 lb

## 2018-05-13 DIAGNOSIS — Z171 Estrogen receptor negative status [ER-]: Secondary | ICD-10-CM | POA: Diagnosis not present

## 2018-05-13 DIAGNOSIS — Z9221 Personal history of antineoplastic chemotherapy: Secondary | ICD-10-CM | POA: Insufficient documentation

## 2018-05-13 DIAGNOSIS — C50411 Malignant neoplasm of upper-outer quadrant of right female breast: Secondary | ICD-10-CM | POA: Insufficient documentation

## 2018-05-13 DIAGNOSIS — Z5181 Encounter for therapeutic drug level monitoring: Secondary | ICD-10-CM

## 2018-05-13 DIAGNOSIS — Z5112 Encounter for antineoplastic immunotherapy: Secondary | ICD-10-CM | POA: Diagnosis present

## 2018-05-13 DIAGNOSIS — Z923 Personal history of irradiation: Secondary | ICD-10-CM | POA: Insufficient documentation

## 2018-05-13 DIAGNOSIS — Z79899 Other long term (current) drug therapy: Principal | ICD-10-CM

## 2018-05-13 MED ORDER — HEPARIN SOD (PORK) LOCK FLUSH 100 UNIT/ML IV SOLN
500.0000 [IU] | Freq: Once | INTRAVENOUS | Status: AC | PRN
  Administered 2018-05-13: 500 [IU]
  Filled 2018-05-13: qty 5

## 2018-05-13 MED ORDER — SODIUM CHLORIDE 0.9 % IV SOLN
Freq: Once | INTRAVENOUS | Status: AC
  Administered 2018-05-13: 08:00:00 via INTRAVENOUS
  Filled 2018-05-13: qty 250

## 2018-05-13 MED ORDER — TRASTUZUMAB CHEMO 150 MG IV SOLR
450.0000 mg | Freq: Once | INTRAVENOUS | Status: AC
  Administered 2018-05-13: 450 mg via INTRAVENOUS
  Filled 2018-05-13: qty 21.43

## 2018-05-13 MED ORDER — DIPHENHYDRAMINE HCL 25 MG PO CAPS
ORAL_CAPSULE | ORAL | Status: AC
  Filled 2018-05-13: qty 2

## 2018-05-13 MED ORDER — ACETAMINOPHEN 325 MG PO TABS
650.0000 mg | ORAL_TABLET | Freq: Once | ORAL | Status: AC
  Administered 2018-05-13: 650 mg via ORAL

## 2018-05-13 MED ORDER — ACETAMINOPHEN 325 MG PO TABS
ORAL_TABLET | ORAL | Status: AC
  Filled 2018-05-13: qty 2

## 2018-05-13 MED ORDER — DIPHENHYDRAMINE HCL 25 MG PO CAPS
50.0000 mg | ORAL_CAPSULE | Freq: Once | ORAL | Status: AC
  Administered 2018-05-13: 50 mg via ORAL

## 2018-05-13 MED ORDER — SODIUM CHLORIDE 0.9% FLUSH
10.0000 mL | INTRAVENOUS | Status: DC | PRN
  Administered 2018-05-13: 10 mL
  Filled 2018-05-13: qty 10

## 2018-05-13 NOTE — Patient Instructions (Signed)
[facility] Discharge Instructions for Patients Receiving Chemotherapy  Today you received the following chemotherapy agents Herceptin  To help prevent nausea and vomiting after your treatment, we encourage you to take your nausea medication as directed   If you develop nausea and vomiting that is not controlled by your nausea medication, call the clinic.   BELOW ARE SYMPTOMS THAT SHOULD BE REPORTED IMMEDIATELY:  *FEVER GREATER THAN 100.5 F  *CHILLS WITH OR WITHOUT FEVER  NAUSEA AND VOMITING THAT IS NOT CONTROLLED WITH YOUR NAUSEA MEDICATION  *UNUSUAL SHORTNESS OF BREATH  *UNUSUAL BRUISING OR BLEEDING  TENDERNESS IN MOUTH AND THROAT WITH OR WITHOUT PRESENCE OF ULCERS  *URINARY PROBLEMS  *BOWEL PROBLEMS  UNUSUAL RASH Items with * indicate a potential emergency and should be followed up as soon as possible.  Feel free to call the clinic should you have any questions or concerns. The clinic phone number is [phone].  Please show the CHEMO ALERT CARD at check-in to the Emergency Department and triage nurse.   

## 2018-05-13 NOTE — Telephone Encounter (Signed)
Called pt to let her know that she will need to contact her cardiologist to notify them of her palpitation symptoms. LVM and information regarding Dr.Dalton (cardiologist) contact information. Pt also aware of echocardiogram.

## 2018-05-13 NOTE — Progress Notes (Signed)
Provided pt with copy of echocardiogram schedule this week 8/22.

## 2018-05-16 ENCOUNTER — Ambulatory Visit (HOSPITAL_COMMUNITY)
Admission: RE | Admit: 2018-05-16 | Discharge: 2018-05-16 | Disposition: A | Discharge status: Home / Self Care | Payer: Medicaid Other | Source: Ambulatory Visit | Attending: Hematology and Oncology | Admitting: Hematology and Oncology

## 2018-05-16 DIAGNOSIS — Z5181 Encounter for therapeutic drug level monitoring: Secondary | ICD-10-CM | POA: Insufficient documentation

## 2018-05-16 DIAGNOSIS — I313 Pericardial effusion (noninflammatory): Secondary | ICD-10-CM | POA: Insufficient documentation

## 2018-05-16 DIAGNOSIS — I5030 Unspecified diastolic (congestive) heart failure: Secondary | ICD-10-CM | POA: Insufficient documentation

## 2018-05-16 DIAGNOSIS — Z79899 Other long term (current) drug therapy: Secondary | ICD-10-CM | POA: Insufficient documentation

## 2018-05-16 DIAGNOSIS — I080 Rheumatic disorders of both mitral and aortic valves: Secondary | ICD-10-CM | POA: Insufficient documentation

## 2018-05-16 NOTE — Progress Notes (Signed)
  Echocardiogram 2D Echocardiogram has been performed.  Madelaine Etienne 05/16/2018, 11:53 AM

## 2018-05-20 ENCOUNTER — Telehealth: Payer: Self-pay

## 2018-05-20 NOTE — Telephone Encounter (Signed)
appt has been made for pt with Dr Aundra Dubin, cardiologist, regarding intermittent palpitations.  Pt is aware of appt date/time.

## 2018-05-28 ENCOUNTER — Telehealth: Payer: Self-pay

## 2018-05-28 NOTE — Telephone Encounter (Signed)
Pt called to notify Dr.Gudena that her bp was elevated in the 160's sys/90's while she was a the dentist office today. She also reports experiencing palpitations on/off throughout the day. Pt noticed swelling in the hands, feet, and face over the past few days. Denies headaches, double vision, dizziness and chest pain/sob at this time. Advised that pt contact Dr.Mclean's office (cardiovascular dr) today and make them aware of all her symptoms. She will need to be seen sooner. Pt verbalized understanding and will contact the heart and vascular clinic as directed.

## 2018-06-03 ENCOUNTER — Inpatient Hospital Stay (HOSPITAL_BASED_OUTPATIENT_CLINIC_OR_DEPARTMENT_OTHER): Payer: Medicaid Other | Admitting: Hematology and Oncology

## 2018-06-03 ENCOUNTER — Inpatient Hospital Stay: Payer: Medicaid Other

## 2018-06-03 ENCOUNTER — Inpatient Hospital Stay: Payer: Medicaid Other | Attending: Hematology and Oncology

## 2018-06-03 DIAGNOSIS — M549 Dorsalgia, unspecified: Secondary | ICD-10-CM

## 2018-06-03 DIAGNOSIS — Z923 Personal history of irradiation: Secondary | ICD-10-CM | POA: Diagnosis not present

## 2018-06-03 DIAGNOSIS — Z803 Family history of malignant neoplasm of breast: Secondary | ICD-10-CM

## 2018-06-03 DIAGNOSIS — Z9221 Personal history of antineoplastic chemotherapy: Secondary | ICD-10-CM

## 2018-06-03 DIAGNOSIS — C50411 Malignant neoplasm of upper-outer quadrant of right female breast: Secondary | ICD-10-CM

## 2018-06-03 DIAGNOSIS — Z5112 Encounter for antineoplastic immunotherapy: Secondary | ICD-10-CM | POA: Insufficient documentation

## 2018-06-03 DIAGNOSIS — Z171 Estrogen receptor negative status [ER-]: Secondary | ICD-10-CM | POA: Insufficient documentation

## 2018-06-03 DIAGNOSIS — M25519 Pain in unspecified shoulder: Secondary | ICD-10-CM

## 2018-06-03 DIAGNOSIS — Z95828 Presence of other vascular implants and grafts: Secondary | ICD-10-CM

## 2018-06-03 LAB — CBC WITH DIFFERENTIAL (CANCER CENTER ONLY)
BASOS ABS: 0 10*3/uL (ref 0.0–0.1)
BASOS PCT: 1 %
Eosinophils Absolute: 0.1 10*3/uL (ref 0.0–0.5)
Eosinophils Relative: 1 %
HEMATOCRIT: 33.8 % — AB (ref 34.8–46.6)
Hemoglobin: 10.9 g/dL — ABNORMAL LOW (ref 11.6–15.9)
LYMPHS ABS: 1.9 10*3/uL (ref 0.9–3.3)
Lymphocytes Relative: 33 %
MCH: 26.3 pg (ref 25.1–34.0)
MCHC: 32.4 g/dL (ref 31.5–36.0)
MCV: 81.1 fL (ref 79.5–101.0)
MONO ABS: 0.5 10*3/uL (ref 0.1–0.9)
MONOS PCT: 9 %
NEUTROS PCT: 56 %
Neutro Abs: 3.2 10*3/uL (ref 1.5–6.5)
PLATELETS: 217 10*3/uL (ref 145–400)
RBC: 4.16 MIL/uL (ref 3.70–5.45)
RDW: 16.4 % — ABNORMAL HIGH (ref 11.2–14.5)
WBC: 5.6 10*3/uL (ref 3.9–10.3)

## 2018-06-03 LAB — CMP (CANCER CENTER ONLY)
ALBUMIN: 3.7 g/dL (ref 3.5–5.0)
ALT: 10 U/L (ref 0–44)
ANION GAP: 6 (ref 5–15)
AST: 14 U/L — AB (ref 15–41)
Alkaline Phosphatase: 77 U/L (ref 38–126)
BILIRUBIN TOTAL: 0.3 mg/dL (ref 0.3–1.2)
BUN: 10 mg/dL (ref 6–20)
CHLORIDE: 106 mmol/L (ref 98–111)
CO2: 29 mmol/L (ref 22–32)
Calcium: 9.3 mg/dL (ref 8.9–10.3)
Creatinine: 0.73 mg/dL (ref 0.44–1.00)
GFR, Est AFR Am: >60 mL/min (ref >60)
Glucose, Bld: 76 mg/dL (ref 70–99)
POTASSIUM: 3.9 mmol/L (ref 3.5–5.1)
Sodium: 141 mmol/L (ref 135–145)
TOTAL PROTEIN: 6.5 g/dL (ref 6.5–8.1)

## 2018-06-03 MED ORDER — TRASTUZUMAB CHEMO 150 MG IV SOLR
450.0000 mg | Freq: Once | INTRAVENOUS | Status: AC
  Administered 2018-06-03: 450 mg via INTRAVENOUS
  Filled 2018-06-03: qty 21.43

## 2018-06-03 MED ORDER — SODIUM CHLORIDE 0.9 % IV SOLN
Freq: Once | INTRAVENOUS | Status: AC
  Administered 2018-06-03: 12:00:00 via INTRAVENOUS
  Filled 2018-06-03: qty 250

## 2018-06-03 MED ORDER — PROCHLORPERAZINE MALEATE 10 MG PO TABS: 10.0000 mg | ORAL_TABLET | Freq: Four times a day (QID) | ORAL | 1 refills | Status: DC | PRN

## 2018-06-03 MED ORDER — ACETAMINOPHEN 325 MG PO TABS
ORAL_TABLET | ORAL | Status: AC
  Filled 2018-06-03: qty 2

## 2018-06-03 MED ORDER — ACETAMINOPHEN 325 MG PO TABS
650.0000 mg | ORAL_TABLET | Freq: Once | ORAL | Status: AC
  Administered 2018-06-03: 650 mg via ORAL

## 2018-06-03 MED ORDER — DIPHENHYDRAMINE HCL 25 MG PO CAPS
50.0000 mg | ORAL_CAPSULE | Freq: Once | ORAL | Status: AC
  Administered 2018-06-03: 50 mg via ORAL

## 2018-06-03 MED ORDER — HEPARIN SOD (PORK) LOCK FLUSH 100 UNIT/ML IV SOLN
500.0000 [IU] | Freq: Once | INTRAVENOUS | Status: AC | PRN
  Administered 2018-06-03: 500 [IU]
  Filled 2018-06-03: qty 5

## 2018-06-03 MED ORDER — SODIUM CHLORIDE 0.9% FLUSH
10.0000 mL | INTRAVENOUS | Status: DC | PRN
  Administered 2018-06-03: 10 mL via INTRAVENOUS
  Filled 2018-06-03: qty 10

## 2018-06-03 MED ORDER — DIPHENHYDRAMINE HCL 25 MG PO CAPS
ORAL_CAPSULE | ORAL | Status: AC
  Filled 2018-06-03: qty 2

## 2018-06-03 MED ORDER — SODIUM CHLORIDE 0.9% FLUSH
10.0000 mL | INTRAVENOUS | Status: DC | PRN
  Administered 2018-06-03: 10 mL
  Filled 2018-06-03: qty 10

## 2018-06-03 NOTE — Progress Notes (Signed)
Patient Care Team: Javier Docker, MD as PCP - General (Internal Medicine) Erroll Luna, MD as Consulting Physician (General Surgery)  DIAGNOSIS:  Encounter Diagnosis  Name Primary?  . Malignant neoplasm of upper-outer quadrant of right breast in female, estrogen receptor negative ([place])     SUMMARY OF ONCOLOGIC HISTORY:   Malignant neoplasm of upper-outer quadrant of right breast in female, estrogen receptor negative ([place])   06/26/2017 Initial Diagnosis    Right breast biopsy 8:30 position 8 cm from nipple: IDC with DCIS, second biopsy 6 cm from nipple fibrocystic changes: Grade 3, ER 0%, PR 0%, Ki-67 40%, HER-2 positive ratio 3.59; mammogram and ultrasound revealed 1.9 cm mass at 8:30 position, adjacent 0.7 cm cystic mass, T1c N0 stage IA AJCC 8     07/24/2017 Surgery    Right lumpectomy: IDC grade 3, 1.8 cm, 0/2 lymph nodes negative,ER 0%, PR 0%, Ki-67 40%, HER-2 positive ratio 3.59, T1CN0 stage I a     09/10/2017 - 11/26/2017 Chemotherapy    Taxol Herceptin weekly x12 followed by Herceptin maintenance for 1 year    12/19/2017 - 01/15/2018 Radiation Therapy    Adjuvant radiation therapy     CHIEF COMPLIANT: Follow-up on Herceptin therapy, recent complaints of palpitations  INTERVAL HISTORY: Katherine [name] is a 47 year old with above-mentioned history of right breast cancer currently on Herceptin maintenance therapy.  She appears to be tolerating Herceptin extremely well.  She had episodes of palpitations for which she was asked to see Dr. Aundra Dubin.  It appears that the palpitations have improved.  Patient does experience a lot of pain and discomfort in her right shoulder as well as her lower back.  She had osteoarthritis and had received injections but it appears that it is not helping her.  She is following with her primary care physician and orthopedics for this issue.  REVIEW OF SYSTEMS:   Constitutional: Denies fevers, chills or abnormal weight loss Eyes: Denies blurriness  of vision Ears, nose, mouth, throat, and face: Denies mucositis or sore throat Respiratory: Denies cough, dyspnea or wheezes Cardiovascular: Denies palpitation, chest discomfort Gastrointestinal:  Denies nausea, heartburn or change in bowel habits Skin: Denies abnormal skin rashes Lymphatics: Denies new lymphadenopathy or easy bruising Neurological:Denies numbness, tingling or new weaknesses Behavioral/Psych: Mood is stable, no new changes  Extremities: No lower extremity edema   All other systems were reviewed with the patient and are negative.  I have reviewed the past medical history, past surgical history, social history and family history with the patient and they are unchanged from previous note.  ALLERGIES:  is allergic to morphine; shellfish allergy; penicillins; codeine; hydrocodone; and tomato.  MEDICATIONS:  Current Outpatient Medications  Medication Sig Dispense Refill  . albuterol (PROVENTIL HFA;VENTOLIN HFA) 108 (90 Base) MCG/ACT inhaler Inhale 2 puffs into the lungs every 6 (six) hours as needed for wheezing or shortness of breath.    . famotidine (PEPCID) 20 MG tablet Take 1 tablet (20 mg total) by mouth 2 (two) times daily. 90 tablet 3  . ibuprofen (ADVIL,MOTRIN) 600 MG tablet Take 1 tablet (600 mg total) by mouth every 6 (six) hours as needed. 30 tablet 0  . ipratropium (ATROVENT) 0.03 % nasal spray SPRAY TWO SQUIRTS INTO EACH NOSTRIL TWICE A DAY  1  . lidocaine-prilocaine (EMLA) cream Apply to affected area once 30 g 3  . pantoprazole (PROTONIX) 40 MG tablet Take 40 mg by mouth daily.    . prochlorperazine (COMPAZINE) 10 MG tablet Take 1 tablet (10  mg total) by mouth every 6 (six) hours as needed (Nausea or vomiting). 30 tablet 1   No current facility-administered medications for this visit.    Facility-Administered Medications Ordered in Other Visits  Medication Dose Route Frequency Provider Last Rate Last Dose  . heparin lock flush 100 unit/mL  500 Units  Intracatheter Once PRN Nicholas Lose, MD      . sodium chloride flush (NS) 0.9 % injection 10 mL  10 mL Intracatheter PRN Nicholas Lose, MD      . trastuzumab (HERCEPTIN) 450 mg in sodium chloride 0.9 % 250 mL chemo infusion  450 mg Intravenous Once Nicholas Lose, MD        PHYSICAL EXAMINATION: ECOG PERFORMANCE STATUS: 1 - Symptomatic but completely ambulatory  Vitals:   06/03/18 1035  BP: 124/88  Pulse: (!) 58  Resp: 18  Temp: 98.5 F (36.9 C)  SpO2: 100%   Filed Weights   06/03/18 1035  Weight: 158 lb 14.4 oz (72.1 kg)    GENERAL:alert, no distress and comfortable SKIN: skin color, texture, turgor are normal, no rashes or significant lesions EYES: normal, Conjunctiva are pink and non-injected, sclera clear OROPHARYNX:no exudate, no erythema and lips, buccal mucosa, and tongue normal  NECK: supple, thyroid normal size, non-tender, without nodularity LYMPH:  no palpable lymphadenopathy in the cervical, axillary or inguinal LUNGS: clear to auscultation and percussion with normal breathing effort HEART: regular rate & rhythm and no murmurs and no lower extremity edema ABDOMEN:abdomen soft, non-tender and normal bowel sounds MUSCULOSKELETAL:no cyanosis of digits and no clubbing  NEURO: alert & oriented x 3 with fluent speech, no focal motor/sensory deficits EXTREMITIES: No lower extremity edema  LABORATORY DATA:  I have reviewed the data as listed CMP Latest Ref Rng & Units 06/03/2018 04/22/2018 03/11/2018  Glucose 70 - 99 mg/dL 76 97 91  BUN 6 - 20 mg/dL '10 15 12  ' Creatinine 0.44 - 1.00 mg/dL 0.73 0.81 0.75  Sodium 135 - 145 mmol/L 141 144 141  Potassium 3.5 - 5.1 mmol/L 3.9 3.7 3.9  Chloride 98 - 111 mmol/L 106 108 106  CO2 22 - 32 mmol/L '29 28 27  ' Calcium 8.9 - 10.3 mg/dL 9.3 9.5 9.8  Total Protein 6.5 - 8.1 g/dL 6.5 7.0 7.4  Total Bilirubin 0.3 - 1.2 mg/dL 0.3 0.4 0.3  Alkaline Phos 38 - 126 U/L 77 76 91  AST 15 - 41 U/L 14(L) 15 19  ALT 0 - 44 U/L '10 12 14    ' Lab  Results  Component Value Date   WBC 5.6 06/03/2018   HGB 10.9 (L) 06/03/2018   HCT 33.8 (L) 06/03/2018   MCV 81.1 06/03/2018   PLT 217 06/03/2018   NEUTROABS 3.2 06/03/2018    ASSESSMENT & PLAN:  Malignant neoplasm of upper-outer quadrant of right breast in female, estrogen receptor negative ([facility]CC) 07/24/2017:Right lumpectomy: IDC grade 3, 1.8 cm, 0/2 lymph nodes negative,ER 0%, PR 0%, Ki-67 40%, HER-2 positive ratio 3.59, T1CN0 stage I a  Recommendation: 1. Adjuvant chemotherapy with Taxol Herceptin completed 3/4/2019followed by Herceptin maintenance for 1 year 2. followed by adjuvant radiation therapy ------------------------------------------------------------------------------------------------------------------------------------ Current treatment:Herceptin maintenance therapy. (Will complete December 2019) Adjuvant radiation therapy.  12/19/2017 to 01/15/2018 Because she is ER PR negative there is no role of adjuvant antiestrogen therapy.  Unintentional weight loss and generalized bone pain: CT scans and bone scans did not show any evidence of metastatic disease.  She is experiencing shoulder pain as well as back pain.  She is  seeing a primary care physician orthopedic.  I discussed with her that we will not be prescribing her pain medications for noncancer related pain.  Return to clinic every 3 weeks for Herceptin every 6 weeks for follow-up with me.  Her Herceptin will complete by first week of December.    No orders of the defined types were placed in this encounter.  The patient has a good understanding of the overall plan. she agrees with it. she will call with any problems that may develop before the next visit here.   Harriette Ohara, MD 06/03/18

## 2018-06-03 NOTE — Patient Instructions (Signed)
[facility] Discharge Instructions for Patients Receiving Chemotherapy  Today you received the following chemotherapy agents Herceptin  To help prevent nausea and vomiting after your treatment, we encourage you to take your nausea medication as directed   If you develop nausea and vomiting that is not controlled by your nausea medication, call the clinic.   BELOW ARE SYMPTOMS THAT SHOULD BE REPORTED IMMEDIATELY:  *FEVER GREATER THAN 100.5 F  *CHILLS WITH OR WITHOUT FEVER  NAUSEA AND VOMITING THAT IS NOT CONTROLLED WITH YOUR NAUSEA MEDICATION  *UNUSUAL SHORTNESS OF BREATH  *UNUSUAL BRUISING OR BLEEDING  TENDERNESS IN MOUTH AND THROAT WITH OR WITHOUT PRESENCE OF ULCERS  *URINARY PROBLEMS  *BOWEL PROBLEMS  UNUSUAL RASH Items with * indicate a potential emergency and should be followed up as soon as possible.  Feel free to call the clinic should you have any questions or concerns. The clinic phone number is [phone].  Please show the CHEMO ALERT CARD at check-in to the Emergency Department and triage nurse.   

## 2018-06-03 NOTE — Assessment & Plan Note (Signed)
07/24/2017:Right lumpectomy: IDC grade 3, 1.8 cm, 0/2 lymph nodes negative,ER 0%, PR 0%, Ki-67 40%, HER-2 positive ratio 3.59, T1CN0 stage I a  Recommendation: 1. Adjuvant chemotherapy with Taxol Herceptin completed 3/4/2019followed by Herceptin maintenance for 1 year 2. followed by adjuvant radiation therapy ------------------------------------------------------------------------------------------------------------------------------------ Current treatment:Herceptin maintenance therapy. (Will complete December 2019) Adjuvant radiation therapy.  12/19/2017 to 01/15/2018 Because she is ER PR negative there is no role of adjuvant antiestrogen therapy.  Unintentional weight loss and generalized bone pain: CT scans and bone scans did not show any evidence of metastatic disease.  She is experiencing shoulder pain as well as back pain.  She is seeing a primary care physician orthopedic.  I discussed with her that we will not be prescribing her pain medications for noncancer related pain.  Return to clinic every 3 weeks for Herceptin every 6 weeks for follow-up with me.  Her Herceptin will complete by first week of December.

## 2018-06-05 ENCOUNTER — Other Ambulatory Visit: Payer: Self-pay

## 2018-06-05 ENCOUNTER — Telehealth: Payer: Self-pay | Admitting: Licensed Clinical Social Worker

## 2018-06-05 ENCOUNTER — Ambulatory Visit (HOSPITAL_COMMUNITY)
Admission: RE | Admit: 2018-06-05 | Discharge: 2018-06-05 | Disposition: A | Discharge status: Home / Self Care | Payer: Medicaid Other | Source: Ambulatory Visit | Attending: Cardiology | Admitting: Cardiology

## 2018-06-05 VITALS — BP 124/82 | HR 59 | Wt 157.2 lb

## 2018-06-05 DIAGNOSIS — C50911 Malignant neoplasm of unspecified site of right female breast: Secondary | ICD-10-CM | POA: Diagnosis present

## 2018-06-05 DIAGNOSIS — I471 Supraventricular tachycardia: Secondary | ICD-10-CM

## 2018-06-05 DIAGNOSIS — Z820 Family history of epilepsy and other diseases of the nervous system: Secondary | ICD-10-CM | POA: Insufficient documentation

## 2018-06-05 DIAGNOSIS — Z79899 Other long term (current) drug therapy: Secondary | ICD-10-CM | POA: Insufficient documentation

## 2018-06-05 DIAGNOSIS — Z171 Estrogen receptor negative status [ER-]: Secondary | ICD-10-CM | POA: Insufficient documentation

## 2018-06-05 DIAGNOSIS — Z803 Family history of malignant neoplasm of breast: Secondary | ICD-10-CM | POA: Diagnosis not present

## 2018-06-05 DIAGNOSIS — C50411 Malignant neoplasm of upper-outer quadrant of right female breast: Secondary | ICD-10-CM

## 2018-06-05 DIAGNOSIS — F17210 Nicotine dependence, cigarettes, uncomplicated: Secondary | ICD-10-CM | POA: Diagnosis not present

## 2018-06-05 DIAGNOSIS — R002 Palpitations: Secondary | ICD-10-CM

## 2018-06-05 DIAGNOSIS — Z8249 Family history of ischemic heart disease and other diseases of the circulatory system: Secondary | ICD-10-CM | POA: Diagnosis not present

## 2018-06-05 MED ORDER — METOPROLOL SUCCINATE ER 25 MG PO TB24: 25.0000 mg | ORAL_TABLET | Freq: Every day | ORAL | 11 refills | Status: DC

## 2018-06-05 NOTE — Patient Instructions (Signed)
Zio patch for 14 days. Call company # on package for any issues/questions. Return monitor in package included in your mail box on 26th.  START Toprol XL 25 mg tablet once daily.  Return in 3 months for follow up appointment with Dr. Aundra Dubin and echocardiogram.  __________________________________________________________ [name] Code: 1900  Take all medication as prescribed the day of your appointment. Bring all medications with you to your appointment.  Do the following things EVERYDAY: 1) Weigh yourself in the morning before breakfast. Write it down and keep it in a log. 2) Take your medicines as prescribed 3) Eat low salt foods-Limit salt (sodium) to 2000 mg per day.  4) Stay as active as you can everyday 5) Limit all fluids for the day to less than 2 liters

## 2018-06-05 NOTE — Telephone Encounter (Signed)
Patient requested to see CSW although unable to stay to meet with CSW in the clinic. CSW contacted via phone and patient reports "I needed a gas card" she then reported that she found someone to help and denied any further needs. CSW available if needed. Raquel Sarna, [place], [place]

## 2018-06-06 NOTE — Progress Notes (Signed)
Oncology: Dr. Lindi Adie  47 yo was referred by Dr. Lindi Adie for cardio-oncology evaluation. She has breast cancer that was diagnosed on right in 10/18.  ER-/PR-/HER2+.  Right lumpectomy 10/18. She will have Taxol/Herceptin followed by Herceptin alone x 1 year, finishing 12/19.   She has a history of SVT.  Recently she has been having more frequent palpitations.  Heart will race for 20-30 minutes at a time.  She is lightheaded but has not passed out.  Chest will hurt when her heart races.  Often triggered by emotional stress.  No significant exertional dyspnea or chest pain.   PMH: 1. H/o SVT 2. Breast cancer: Diagnosed on right in 10/18.  ER-/PR-/HER2+.  Right lumpectomy 10/18. She will have Taxol/Herceptin followed by Herceptin alone x 1 year.  - Echo (11/18): EF 95-62%, normal diastolic function, GLS -13.0%.  - Echo (2/19): EF 86-57%, normal diastolic function, GLS -84.6% - Echo (9/19): EF 96-29%, grade 2 diastolic dysfunction, GLS -20.1%  Family History  Problem Relation Age of Onset  . Migraines Daughter   . Hypertension Mother   . Heart disease Mother   . Hypertension Father   . Heart disease Father   . Breast cancer Sister 56       deceased at age 55  . Migraines Son   . Hypertension Unknown   . Colon cancer Neg Hx   . Colon polyps Neg Hx   . Diabetes Neg Hx   . Kidney disease Neg Hx   . Liver disease Neg Hx   . Heart attack Neg Hx   . Stroke Neg Hx    Social History   Socioeconomic History  . Marital status: Married    Spouse name: Not on file  . Number of children: 2  . Years of education: 14  . Highest education level: Not on file  Occupational History  . Occupation: unemployed  Social Needs  . Financial resource strain: Not on file  . Food insecurity:    Worry: Not on file    Inability: Not on file  . Transportation needs:    Medical: Not on file    Non-medical: Not on file  Tobacco Use  . Smoking status: Former Smoker    Packs/day: 0.50    Types: Cigarettes     Last attempt to quit: 07/16/2017    Years since quitting: 0.8  . Smokeless tobacco: Never Used  . Tobacco comment: smokes 1cigarette per day and more if she is stressed  Substance and Sexual Activity  . Alcohol use: No    Alcohol/week: 0.0 standard drinks  . Drug use: Yes    Types: Marijuana    Comment: occasional, for pain. states she hasnt used for a mont; 08/02/17 sometimes  . Sexual activity: Yes    Birth control/protection: Surgical  Lifestyle  . Physical activity:    Days per week: Not on file    Minutes per session: Not on file  . Stress: Not on file  Relationships  . Social connections:    Talks on phone: Not on file    Gets together: Not on file    Attends religious service: Not on file    Active member of club or organization: Not on file    Attends meetings of clubs or organizations: Not on file    Relationship status: Not on file  Other Topics Concern  . Not on file  Social History Narrative   On disability for heart condition.  Pt. Was non-specific.  Patient does not drink caffeine.   Patient is ambidextrous, but mostly right handed.    12th grade, some college courses   ROS: All systems reviewed and negative except as per HPI  Current Outpatient Medications  Medication Sig Dispense Refill  . albuterol (PROVENTIL HFA;VENTOLIN HFA) 108 (90 Base) MCG/ACT inhaler Inhale 2 puffs into the lungs every 6 (six) hours as needed for wheezing or shortness of breath.    . famotidine (PEPCID) 20 MG tablet Take 1 tablet (20 mg total) by mouth 2 (two) times daily. 90 tablet 3  . ibuprofen (ADVIL,MOTRIN) 600 MG tablet Take 1 tablet (600 mg total) by mouth every 6 (six) hours as needed. 30 tablet 0  . ipratropium (ATROVENT) 0.03 % nasal spray SPRAY TWO SQUIRTS INTO EACH NOSTRIL TWICE A DAY  1  . lidocaine-prilocaine (EMLA) cream Apply to affected area once 30 g 3  . pantoprazole (PROTONIX) 40 MG tablet Take 40 mg by mouth daily.    . prochlorperazine (COMPAZINE) 10 MG  tablet Take 1 tablet (10 mg total) by mouth every 6 (six) hours as needed (Nausea or vomiting). 30 tablet 1  . metoprolol succinate (TOPROL XL) 25 MG 24 hr tablet Take 1 tablet (25 mg total) by mouth daily. 30 tablet 11   No current facility-administered medications for this encounter.    BP 124/82   Pulse (!) 59   Wt 71.3 kg (157 lb 4 oz)   LMP 11/13/2016   SpO2 100%   BMI 26.17 kg/m  General: NAD Neck: No JVD, no thyromegaly or thyroid nodule.  Lungs: Clear to auscultation bilaterally with normal respiratory effort. CV: Nondisplaced PMI.  Heart regular S1/S2, no S3/S4, no murmur.  No peripheral edema.  No carotid bruit.  Normal pedal pulses.  Abdomen: Soft, nontender, no hepatosplenomegaly, no distention.  Skin: Intact without lesions or rashes.  Neurologic: Alert and oriented x 3.  Psych: Normal affect. Extremities: No clubbing or cyanosis.  HEENT: Normal.   Assessment/Plan: 1. Breast cancer: Patient will be getting Herceptin-based treatment for a year.  I reviewed today's echo: normal LV EF and strain pattern.  She will continue Herceptin and return for echo in 3 months, this will likely be her last echo as she finishes Herceptin in 12/19.   2. SVT: Increase runs of tachypalpitations with some lightheadedness.  Possible SVT runs.  - Start Toprol XL 25 mg daily.  - I will have her wear a Zio patch x 2 wks to document any arrhythmias.  If she has SVT that is not suppressed by Toprol XL, will refer to EP for ablation consideration.   Followup in 3 months with echo.   Loralie Champagne 06/06/2018

## 2018-06-24 ENCOUNTER — Inpatient Hospital Stay: Payer: Medicaid Other

## 2018-06-24 ENCOUNTER — Other Ambulatory Visit: Payer: Self-pay

## 2018-06-24 ENCOUNTER — Telehealth (HOSPITAL_COMMUNITY): Payer: Self-pay

## 2018-06-24 VITALS — BP 128/85 | HR 66 | Temp 98.9°F | Resp 18

## 2018-06-24 DIAGNOSIS — Z5112 Encounter for antineoplastic immunotherapy: Secondary | ICD-10-CM | POA: Diagnosis not present

## 2018-06-24 DIAGNOSIS — Z171 Estrogen receptor negative status [ER-]: Principal | ICD-10-CM

## 2018-06-24 DIAGNOSIS — C50411 Malignant neoplasm of upper-outer quadrant of right female breast: Secondary | ICD-10-CM

## 2018-06-24 MED ORDER — DIPHENHYDRAMINE HCL 25 MG PO CAPS
50.0000 mg | ORAL_CAPSULE | Freq: Once | ORAL | Status: AC
  Administered 2018-06-24: 50 mg via ORAL

## 2018-06-24 MED ORDER — TRASTUZUMAB CHEMO 150 MG IV SOLR
450.0000 mg | Freq: Once | INTRAVENOUS | Status: AC
  Administered 2018-06-24: 450 mg via INTRAVENOUS
  Filled 2018-06-24: qty 21.43

## 2018-06-24 MED ORDER — SODIUM CHLORIDE 0.9 % IV SOLN
INTRAVENOUS | Status: DC
  Administered 2018-06-24: 09:00:00 via INTRAVENOUS
  Filled 2018-06-24: qty 250

## 2018-06-24 MED ORDER — SODIUM CHLORIDE 0.9% FLUSH
10.0000 mL | INTRAVENOUS | Status: DC | PRN
  Administered 2018-06-24: 10 mL
  Filled 2018-06-24: qty 10

## 2018-06-24 MED ORDER — HEPARIN SOD (PORK) LOCK FLUSH 100 UNIT/ML IV SOLN
500.0000 [IU] | Freq: Once | INTRAVENOUS | Status: AC | PRN
  Administered 2018-06-24: 500 [IU]
  Filled 2018-06-24: qty 5

## 2018-06-24 MED ORDER — DIPHENHYDRAMINE HCL 25 MG PO CAPS
ORAL_CAPSULE | ORAL | Status: AC
  Filled 2018-06-24: qty 2

## 2018-06-24 MED ORDER — ACETAMINOPHEN 325 MG PO TABS
ORAL_TABLET | ORAL | Status: AC
  Filled 2018-06-24: qty 2

## 2018-06-24 MED ORDER — ACETAMINOPHEN 325 MG PO TABS
650.0000 mg | ORAL_TABLET | Freq: Once | ORAL | Status: AC
  Administered 2018-06-24: 650 mg via ORAL

## 2018-06-24 MED ORDER — GABAPENTIN 300 MG PO CAPS: 300.0000 mg | ORAL_CAPSULE | Freq: Every day | ORAL | 3 refills | Status: DC

## 2018-06-24 NOTE — Patient Instructions (Signed)
[facility] Discharge Instructions for Patients Receiving Chemotherapy  Today you received the following chemotherapy agents Herceptin  To help prevent nausea and vomiting after your treatment, we encourage you to take your nausea medication as directed   If you develop nausea and vomiting that is not controlled by your nausea medication, call the clinic.   BELOW ARE SYMPTOMS THAT SHOULD BE REPORTED IMMEDIATELY:  *FEVER GREATER THAN 100.5 F  *CHILLS WITH OR WITHOUT FEVER  NAUSEA AND VOMITING THAT IS NOT CONTROLLED WITH YOUR NAUSEA MEDICATION  *UNUSUAL SHORTNESS OF BREATH  *UNUSUAL BRUISING OR BLEEDING  TENDERNESS IN MOUTH AND THROAT WITH OR WITHOUT PRESENCE OF ULCERS  *URINARY PROBLEMS  *BOWEL PROBLEMS  UNUSUAL RASH Items with * indicate a potential emergency and should be followed up as soon as possible.  Feel free to call the clinic should you have any questions or concerns. The clinic phone number is [phone].  Please show the CHEMO ALERT CARD at check-in to the Emergency Department and triage nurse.   

## 2018-06-24 NOTE — Telephone Encounter (Signed)
Pt called to give results from heart monitor no answer voice mail was left for pt to call back ,

## 2018-07-15 ENCOUNTER — Inpatient Hospital Stay: Payer: Medicaid Other

## 2018-07-15 ENCOUNTER — Telehealth: Payer: Self-pay | Admitting: Hematology and Oncology

## 2018-07-15 ENCOUNTER — Inpatient Hospital Stay (HOSPITAL_BASED_OUTPATIENT_CLINIC_OR_DEPARTMENT_OTHER): Payer: Medicaid Other | Admitting: Hematology and Oncology

## 2018-07-15 ENCOUNTER — Inpatient Hospital Stay: Payer: Medicaid Other | Attending: Hematology and Oncology

## 2018-07-15 VITALS — BP 140/95 | HR 67 | Temp 98.0°F | Resp 18 | Ht 65.0 in | Wt 158.7 lb

## 2018-07-15 DIAGNOSIS — Z9221 Personal history of antineoplastic chemotherapy: Secondary | ICD-10-CM | POA: Diagnosis not present

## 2018-07-15 DIAGNOSIS — R634 Abnormal weight loss: Secondary | ICD-10-CM | POA: Diagnosis not present

## 2018-07-15 DIAGNOSIS — Z79899 Other long term (current) drug therapy: Secondary | ICD-10-CM | POA: Diagnosis not present

## 2018-07-15 DIAGNOSIS — M898X9 Other specified disorders of bone, unspecified site: Secondary | ICD-10-CM | POA: Insufficient documentation

## 2018-07-15 DIAGNOSIS — Z171 Estrogen receptor negative status [ER-]: Secondary | ICD-10-CM | POA: Diagnosis not present

## 2018-07-15 DIAGNOSIS — Z5112 Encounter for antineoplastic immunotherapy: Secondary | ICD-10-CM | POA: Insufficient documentation

## 2018-07-15 DIAGNOSIS — C50411 Malignant neoplasm of upper-outer quadrant of right female breast: Secondary | ICD-10-CM

## 2018-07-15 DIAGNOSIS — H543 Unqualified visual loss, both eyes: Secondary | ICD-10-CM

## 2018-07-15 DIAGNOSIS — Z923 Personal history of irradiation: Secondary | ICD-10-CM

## 2018-07-15 DIAGNOSIS — Z803 Family history of malignant neoplasm of breast: Secondary | ICD-10-CM

## 2018-07-15 DIAGNOSIS — Z95828 Presence of other vascular implants and grafts: Secondary | ICD-10-CM

## 2018-07-15 LAB — CMP (CANCER CENTER ONLY)
ALT: 12 U/L (ref 0–44)
AST: 15 U/L (ref 15–41)
Albumin: 3.8 g/dL (ref 3.5–5.0)
Alkaline Phosphatase: 83 U/L (ref 38–126)
Anion gap: 9 (ref 5–15)
BUN: 11 mg/dL (ref 6–20)
CALCIUM: 8.7 mg/dL — AB (ref 8.9–10.3)
CO2: 25 mmol/L (ref 22–32)
CREATININE: 0.68 mg/dL (ref 0.44–1.00)
Chloride: 108 mmol/L (ref 98–111)
GFR, Estimated: >60 mL/min (ref >60)
GLUCOSE: 91 mg/dL (ref 70–99)
Potassium: 3.6 mmol/L (ref 3.5–5.1)
SODIUM: 142 mmol/L (ref 135–145)
Total Bilirubin: 0.3 mg/dL (ref 0.3–1.2)
Total Protein: 6.9 g/dL (ref 6.5–8.1)

## 2018-07-15 LAB — CBC WITH DIFFERENTIAL (CANCER CENTER ONLY)
Abs Immature Granulocytes: 0.01 10*3/uL (ref 0.00–0.07)
Basophils Absolute: 0 10*3/uL (ref 0.0–0.1)
Basophils Relative: 1 %
EOS ABS: 0.2 10*3/uL (ref 0.0–0.5)
EOS PCT: 3 %
HEMATOCRIT: 36.6 % (ref 36.0–46.0)
Hemoglobin: 11.6 g/dL — ABNORMAL LOW (ref 12.0–15.0)
Immature Granulocytes: 0 %
LYMPHS ABS: 2 10*3/uL (ref 0.7–4.0)
Lymphocytes Relative: 40 %
MCH: 26.7 pg (ref 26.0–34.0)
MCHC: 31.7 g/dL (ref 30.0–36.0)
MCV: 84.1 fL (ref 80.0–100.0)
MONO ABS: 0.4 10*3/uL (ref 0.1–1.0)
MONOS PCT: 8 %
NRBC: 0 % (ref 0.0–0.2)
Neutro Abs: 2.4 10*3/uL (ref 1.7–7.7)
Neutrophils Relative %: 48 %
Platelet Count: 237 10*3/uL (ref 150–400)
RBC: 4.35 MIL/uL (ref 3.87–5.11)
RDW: 15.7 % — AB (ref 11.5–15.5)
WBC Count: 5 10*3/uL (ref 4.0–10.5)

## 2018-07-15 MED ORDER — TRASTUZUMAB CHEMO 150 MG IV SOLR
450.0000 mg | Freq: Once | INTRAVENOUS | Status: AC
  Administered 2018-07-15: 450 mg via INTRAVENOUS
  Filled 2018-07-15: qty 21.43

## 2018-07-15 MED ORDER — ACETAMINOPHEN 325 MG PO TABS
ORAL_TABLET | ORAL | Status: AC
  Filled 2018-07-15: qty 2

## 2018-07-15 MED ORDER — SODIUM CHLORIDE 0.9 % IV SOLN
Freq: Once | INTRAVENOUS | Status: AC
  Administered 2018-07-15: 10:00:00 via INTRAVENOUS
  Filled 2018-07-15: qty 250

## 2018-07-15 MED ORDER — SODIUM CHLORIDE 0.9% FLUSH
10.0000 mL | INTRAVENOUS | Status: DC | PRN
  Administered 2018-07-15: 10 mL via INTRAVENOUS
  Filled 2018-07-15: qty 10

## 2018-07-15 MED ORDER — DIPHENHYDRAMINE HCL 25 MG PO CAPS
50.0000 mg | ORAL_CAPSULE | Freq: Once | ORAL | Status: AC
  Administered 2018-07-15: 50 mg via ORAL

## 2018-07-15 MED ORDER — SODIUM CHLORIDE 0.9% FLUSH
10.0000 mL | INTRAVENOUS | Status: DC | PRN
  Administered 2018-07-15: 10 mL
  Filled 2018-07-15: qty 10

## 2018-07-15 MED ORDER — HEPARIN SOD (PORK) LOCK FLUSH 100 UNIT/ML IV SOLN
500.0000 [IU] | Freq: Once | INTRAVENOUS | Status: AC | PRN
  Administered 2018-07-15: 500 [IU]
  Filled 2018-07-15: qty 5

## 2018-07-15 MED ORDER — DIPHENHYDRAMINE HCL 25 MG PO CAPS
ORAL_CAPSULE | ORAL | Status: AC
  Filled 2018-07-15: qty 2

## 2018-07-15 MED ORDER — ACETAMINOPHEN 325 MG PO TABS
650.0000 mg | ORAL_TABLET | Freq: Once | ORAL | Status: AC
  Administered 2018-07-15: 650 mg via ORAL

## 2018-07-15 NOTE — Telephone Encounter (Signed)
Gave avs and calendar ° °

## 2018-07-15 NOTE — Progress Notes (Signed)
Patient Care Team: Javier Docker, MD as PCP - General (Internal Medicine) Erroll Luna, MD as Consulting Physician (General Surgery)  DIAGNOSIS:  Encounter Diagnoses  Name Primary?  . Malignant neoplasm of upper-outer quadrant of right breast in female, estrogen receptor negative ([place])   . Decreased vision in both eyes Yes    SUMMARY OF ONCOLOGIC HISTORY:   Malignant neoplasm of upper-outer quadrant of right breast in female, estrogen receptor negative ([place])   06/26/2017 Initial Diagnosis    Right breast biopsy 8:30 position 8 cm from nipple: IDC with DCIS, second biopsy 6 cm from nipple fibrocystic changes: Grade 3, ER 0%, PR 0%, Ki-67 40%, HER-2 positive ratio 3.59; mammogram and ultrasound revealed 1.9 cm mass at 8:30 position, adjacent 0.7 cm cystic mass, T1c N0 stage IA AJCC 8     07/24/2017 Surgery    Right lumpectomy: IDC grade 3, 1.8 cm, 0/2 lymph nodes negative,ER 0%, PR 0%, Ki-67 40%, HER-2 positive ratio 3.59, T1CN0 stage I a     09/10/2017 - 11/26/2017 Chemotherapy    Taxol Herceptin weekly x12 followed by Herceptin maintenance for 1 year    12/19/2017 - 01/15/2018 Radiation Therapy    Adjuvant radiation therapy     CHIEF COMPLIANT: Follow-up on Herceptin treatments  INTERVAL HISTORY: Katherine [name] is a 47-year with above-mentioned history of right breast cancer underwent lumpectomy followed by adjuvant chemotherapy.  She is currently on Herceptin maintenance.  She is tolerating it extremely well.  She has no side effects from Herceptin.  She is requesting an ophthalmology appointment because of decrease in vision.  She is got some allergies making her have runny nose.  REVIEW OF SYSTEMS:   Constitutional: Denies fevers, chills or abnormal weight loss Eyes: Denies blurriness of vision Ears, nose, mouth, throat, and face: Allergies Respiratory: Denies cough, dyspnea or wheezes Cardiovascular: Denies palpitation, chest discomfort Gastrointestinal:  Denies  nausea, heartburn or change in bowel habits Skin: Denies abnormal skin rashes Lymphatics: Denies new lymphadenopathy or easy bruising Neurological:Denies numbness, tingling or new weaknesses Behavioral/Psych: Mood is stable, no new changes  Extremities: No lower extremity edema   All other systems were reviewed with the patient and are negative.  I have reviewed the past medical history, past surgical history, social history and family history with the patient and they are unchanged from previous note.  ALLERGIES:  is allergic to morphine; shellfish allergy; penicillins; codeine; hydrocodone; and tomato.  MEDICATIONS:  Current Outpatient Medications  Medication Sig Dispense Refill  . albuterol (PROVENTIL HFA;VENTOLIN HFA) 108 (90 Base) MCG/ACT inhaler Inhale 2 puffs into the lungs every 6 (six) hours as needed for wheezing or shortness of breath.    . famotidine (PEPCID) 20 MG tablet Take 1 tablet (20 mg total) by mouth 2 (two) times daily. 90 tablet 3  . gabapentin (NEURONTIN) 300 MG capsule Take 1 capsule (300 mg total) by mouth at bedtime. 30 capsule 3  . ibuprofen (ADVIL,MOTRIN) 600 MG tablet Take 1 tablet (600 mg total) by mouth every 6 (six) hours as needed. 30 tablet 0  . ipratropium (ATROVENT) 0.03 % nasal spray SPRAY TWO SQUIRTS INTO EACH NOSTRIL TWICE A DAY  1  . lidocaine-prilocaine (EMLA) cream Apply to affected area once 30 g 3  . metoprolol succinate (TOPROL XL) 25 MG 24 hr tablet Take 1 tablet (25 mg total) by mouth daily. 30 tablet 11  . pantoprazole (PROTONIX) 40 MG tablet Take 40 mg by mouth daily.    . prochlorperazine (COMPAZINE) 10  MG tablet Take 1 tablet (10 mg total) by mouth every 6 (six) hours as needed (Nausea or vomiting). 30 tablet 1   No current facility-administered medications for this visit.     PHYSICAL EXAMINATION: ECOG PERFORMANCE STATUS: 1 - Symptomatic but completely ambulatory  Vitals:   07/15/18 0846  BP: (!) 140/95  Pulse: 67  Resp: 18    Temp: 98 F (36.7 C)  SpO2: 100%   Filed Weights   07/15/18 0846  Weight: 158 lb 11.2 oz (72 kg)    GENERAL:alert, no distress and comfortable SKIN: skin color, texture, turgor are normal, no rashes or significant lesions EYES: normal, Conjunctiva are pink and non-injected, sclera clear OROPHARYNX:no exudate, no erythema and lips, buccal mucosa, and tongue normal  NECK: supple, thyroid normal size, non-tender, without nodularity LYMPH:  no palpable lymphadenopathy in the cervical, axillary or inguinal LUNGS: clear to auscultation and percussion with normal breathing effort HEART: regular rate & rhythm and no murmurs and no lower extremity edema ABDOMEN:abdomen soft, non-tender and normal bowel sounds MUSCULOSKELETAL:no cyanosis of digits and no clubbing  NEURO: alert & oriented x 3 with fluent speech, no focal motor/sensory deficits EXTREMITIES: No lower extremity edema  LABORATORY DATA:  I have reviewed the data as listed CMP Latest Ref Rng & Units 06/03/2018 04/22/2018 03/11/2018  Glucose 70 - 99 mg/dL 76 97 91  BUN 6 - 20 mg/dL '10 15 12  ' Creatinine 0.44 - 1.00 mg/dL 0.73 0.81 0.75  Sodium 135 - 145 mmol/L 141 144 141  Potassium 3.5 - 5.1 mmol/L 3.9 3.7 3.9  Chloride 98 - 111 mmol/L 106 108 106  CO2 22 - 32 mmol/L '29 28 27  ' Calcium 8.9 - 10.3 mg/dL 9.3 9.5 9.8  Total Protein 6.5 - 8.1 g/dL 6.5 7.0 7.4  Total Bilirubin 0.3 - 1.2 mg/dL 0.3 0.4 0.3  Alkaline Phos 38 - 126 U/L 77 76 91  AST 15 - 41 U/L 14(L) 15 19  ALT 0 - 44 U/L '10 12 14    ' Lab Results  Component Value Date   WBC 5.0 07/15/2018   HGB 11.6 (L) 07/15/2018   HCT 36.6 07/15/2018   MCV 84.1 07/15/2018   PLT 237 07/15/2018   NEUTROABS 2.4 07/15/2018    ASSESSMENT & PLAN:  Malignant neoplasm of upper-outer quadrant of right breast in female, estrogen receptor negative ([facility]) 07/24/2017:Right lumpectomy: IDC grade 3, 1.8 cm, 0/2 lymph nodes negative,ER 0%, PR 0%, Ki-67 40%, HER-2 positive ratio 3.59, T1CN0  stage I a  Recommendation: 1. Adjuvant chemotherapy with Taxol Herceptin completed 3/4/2019followed by Herceptin maintenance for 1 year 2. followed by adjuvant radiation therapy 12/19/2017 to 01/15/2018 ------------------------------------------------------------------------------------------------------------------------------------ Current treatment:Herceptin maintenance therapy. (Will complete December 2019) Adjuvant radiation therapy. 12/19/2017 to 01/15/2018 Because she is ER PR negative there is no role of adjuvant antiestrogen therapy.  Unintentional weight loss and generalized bone pain: CT scans and bone scans did not show any evidence of metastatic disease.  Patient thinks that she maintain her weight during chemo because of marijuana.  Since she stopped marijuana she is losing weight.  So she started back up on the marijuana.  Return to clinic every 3 weeks for Herceptin every 6 weeks for follow-up with me.  Her Herceptin will complete by first week of December. I sent a message to Dr. Brantley Stage to remove her port after completion of Herceptin.   Orders Placed This Encounter  Procedures  . Ambulatory referral to Ophthalmology    Referral Priority:   Routine  Referral Type:   Consultation    Referral Reason:   Specialty Services Required    Referred to Provider:   Jola Schmidt, MD    Requested Specialty:   Ophthalmology    Number of Visits Requested:   1   The patient has a good understanding of the overall plan. she agrees with it. she will call with any problems that may develop before the next visit here.   Harriette Ohara, MD 07/15/18

## 2018-07-15 NOTE — Assessment & Plan Note (Signed)
07/24/2017:Right lumpectomy: IDC grade 3, 1.8 cm, 0/2 lymph nodes negative,ER 0%, PR 0%, Ki-67 40%, HER-2 positive ratio 3.59, T1CN0 stage I a  Recommendation: 1. Adjuvant chemotherapy with Taxol Herceptin completed 3/4/2019followed by Herceptin maintenance for 1 year 2. followed by adjuvant radiation therapy ------------------------------------------------------------------------------------------------------------------------------------ Current treatment:Herceptin maintenance therapy. (Will complete December 2019) Adjuvant radiation therapy. 12/19/2017 to 01/15/2018 Because she is ER PR negative there is no role of adjuvant antiestrogen therapy.  Unintentional weight loss and generalized bone pain: CT scans and bone scans did not show any evidence of metastatic disease.   Return to clinic every 3 weeks for Herceptin every 6 weeks for follow-up with me.  Her Herceptin will complete by first week of December.

## 2018-07-15 NOTE — Patient Instructions (Signed)
[facility] Discharge Instructions for Patients Receiving Chemotherapy  Today you received the following chemotherapy agents Herceptin  To help prevent nausea and vomiting after your treatment, we encourage you to take your nausea medication as directed   If you develop nausea and vomiting that is not controlled by your nausea medication, call the clinic.   BELOW ARE SYMPTOMS THAT SHOULD BE REPORTED IMMEDIATELY:  *FEVER GREATER THAN 100.5 F  *CHILLS WITH OR WITHOUT FEVER  NAUSEA AND VOMITING THAT IS NOT CONTROLLED WITH YOUR NAUSEA MEDICATION  *UNUSUAL SHORTNESS OF BREATH  *UNUSUAL BRUISING OR BLEEDING  TENDERNESS IN MOUTH AND THROAT WITH OR WITHOUT PRESENCE OF ULCERS  *URINARY PROBLEMS  *BOWEL PROBLEMS  UNUSUAL RASH Items with * indicate a potential emergency and should be followed up as soon as possible.  Feel free to call the clinic should you have any questions or concerns. The clinic phone number is [phone].  Please show the CHEMO ALERT CARD at check-in to the Emergency Department and triage nurse.   

## 2018-07-24 ENCOUNTER — Other Ambulatory Visit: Payer: Self-pay

## 2018-07-24 ENCOUNTER — Emergency Department (HOSPITAL_COMMUNITY): Payer: Medicaid Other

## 2018-07-24 ENCOUNTER — Emergency Department (HOSPITAL_COMMUNITY)
Admission: EM | Admit: 2018-07-24 | Discharge: 2018-07-25 | Disposition: A | Discharge status: Home / Self Care | Payer: Medicaid Other | Attending: Emergency Medicine | Admitting: Emergency Medicine

## 2018-07-24 ENCOUNTER — Encounter (HOSPITAL_COMMUNITY): Payer: Self-pay | Admitting: Emergency Medicine

## 2018-07-24 DIAGNOSIS — Z79899 Other long term (current) drug therapy: Secondary | ICD-10-CM | POA: Insufficient documentation

## 2018-07-24 DIAGNOSIS — R0789 Other chest pain: Secondary | ICD-10-CM | POA: Insufficient documentation

## 2018-07-24 DIAGNOSIS — R05 Cough: Secondary | ICD-10-CM

## 2018-07-24 DIAGNOSIS — Z853 Personal history of malignant neoplasm of breast: Secondary | ICD-10-CM | POA: Insufficient documentation

## 2018-07-24 DIAGNOSIS — Z87891 Personal history of nicotine dependence: Secondary | ICD-10-CM | POA: Diagnosis not present

## 2018-07-24 DIAGNOSIS — M549 Dorsalgia, unspecified: Secondary | ICD-10-CM

## 2018-07-24 DIAGNOSIS — R059 Cough, unspecified: Secondary | ICD-10-CM

## 2018-07-24 DIAGNOSIS — R079 Chest pain, unspecified: Secondary | ICD-10-CM | POA: Diagnosis present

## 2018-07-24 LAB — HEPATIC FUNCTION PANEL
ALBUMIN: 4.3 g/dL (ref 3.5–5.0)
ALK PHOS: 66 U/L (ref 38–126)
ALT: 10 U/L (ref 0–44)
AST: 19 U/L (ref 15–41)
BILIRUBIN INDIRECT: 0.3 mg/dL (ref 0.3–0.9)
Bilirubin, Direct: 0.2 mg/dL (ref 0.0–0.2)
Total Bilirubin: 0.5 mg/dL (ref 0.3–1.2)
Total Protein: 7.2 g/dL (ref 6.5–8.1)

## 2018-07-24 LAB — BASIC METABOLIC PANEL
Anion gap: 9 (ref 5–15)
BUN: 14 mg/dL (ref 6–20)
CO2: 22 mmol/L (ref 22–32)
CREATININE: 0.6 mg/dL (ref 0.44–1.00)
Calcium: 9.3 mg/dL (ref 8.9–10.3)
Chloride: 106 mmol/L (ref 98–111)
GFR calc Af Amer: >60 mL/min (ref >60)
Glucose, Bld: 97 mg/dL (ref 70–99)
Potassium: 3.6 mmol/L (ref 3.5–5.1)
SODIUM: 137 mmol/L (ref 135–145)

## 2018-07-24 LAB — LIPASE, BLOOD: LIPASE: 33 U/L (ref 11–51)

## 2018-07-24 LAB — CBC
HCT: 40.5 % (ref 36.0–46.0)
Hemoglobin: 12.7 g/dL (ref 12.0–15.0)
MCH: 26.5 pg (ref 26.0–34.0)
MCHC: 31.4 g/dL (ref 30.0–36.0)
MCV: 84.4 fL (ref 80.0–100.0)
Platelets: 215 10*3/uL (ref 150–400)
RBC: 4.8 MIL/uL (ref 3.87–5.11)
RDW: 14.6 % (ref 11.5–15.5)
WBC: 5.7 10*3/uL (ref 4.0–10.5)
nRBC: 0 % (ref 0.0–0.2)

## 2018-07-24 LAB — URINALYSIS, ROUTINE W REFLEX MICROSCOPIC
Bilirubin Urine: NEGATIVE
GLUCOSE, UA: NEGATIVE mg/dL
HGB URINE DIPSTICK: NEGATIVE
Ketones, ur: 20 mg/dL — AB
LEUKOCYTES UA: NEGATIVE
Nitrite: NEGATIVE
PH: 6 (ref 5.0–8.0)
PROTEIN: NEGATIVE mg/dL
Specific Gravity, Urine: 1.029 (ref 1.005–1.030)

## 2018-07-24 LAB — I-STAT TROPONIN, ED: TROPONIN I, POC: 0 ng/mL (ref 0.00–0.08)

## 2018-07-24 LAB — I-STAT BETA HCG BLOOD, ED (MC, WL, AP ONLY): I-stat hCG, quantitative: <5 m[IU]/mL (ref <5)

## 2018-07-24 MED ORDER — OXYCODONE-ACETAMINOPHEN 5-325 MG PO TABS: 1.0000 | ORAL_TABLET | ORAL | 0 refills | Status: DC | PRN

## 2018-07-24 MED ORDER — MORPHINE SULFATE (PF) 4 MG/ML IV SOLN
4.0000 mg | Freq: Once | INTRAVENOUS | Status: AC
  Administered 2018-07-24: 4 mg via INTRAVENOUS
  Filled 2018-07-24: qty 1

## 2018-07-24 MED ORDER — IOPAMIDOL (ISOVUE-370) INJECTION 76%
100.0000 mL | Freq: Once | INTRAVENOUS | Status: AC | PRN
  Administered 2018-07-24: 100 mL via INTRAVENOUS

## 2018-07-24 MED ORDER — IOPAMIDOL (ISOVUE-370) INJECTION 76%
INTRAVENOUS | Status: AC
  Filled 2018-07-24: qty 100

## 2018-07-24 MED ORDER — ONDANSETRON HCL 4 MG/2ML IJ SOLN
4.0000 mg | Freq: Once | INTRAMUSCULAR | Status: AC
  Administered 2018-07-24: 4 mg via INTRAVENOUS
  Filled 2018-07-24: qty 2

## 2018-07-24 MED ORDER — OXYCODONE-ACETAMINOPHEN 5-325 MG PO TABS
1.0000 | ORAL_TABLET | Freq: Once | ORAL | Status: AC
  Administered 2018-07-25: 1 via ORAL
  Filled 2018-07-24: qty 1

## 2018-07-24 NOTE — ED Provider Notes (Addendum)
[facility] EMERGENCY DEPARTMENT Provider Note   CSN: [id number] Arrival date & time: 07/24/18  1454     History   Chief Complaint Chief Complaint  Patient presents with  . Chest Pain  . Cough    HPI Katherine [name] is a 47 y.o. female.  The history is provided by the patient, the spouse and medical records.  Chest Pain   This is a new problem. The current episode started more than 2 days ago. The problem occurs constantly. The problem has not changed since onset.The pain is associated with coughing and breathing. The pain is present in the lateral region. The pain is at a severity of 10/10. The pain is severe. The quality of the pain is described as sharp. The pain radiates to the upper back. The symptoms are aggravated by deep breathing. Associated symptoms include back pain, cough and shortness of breath. Pertinent negatives include no abdominal pain, no diaphoresis, no dizziness, no fever, no headaches, no leg pain, no lower extremity edema, no malaise/fatigue, no nausea, no near-syncope, no palpitations, no syncope and no vomiting. She has tried nothing for the symptoms. The treatment provided no relief.    Past Medical History:  Diagnosis Date  . Anxiety   . Asthma   . Dysrhythmia    History of SVT-No medications  . Fibromyalgia   . GERD (gastroesophageal reflux disease)   . History of radiation therapy 12/19/17- 01/15/18   Right Breast, 2.67 Gy in 15 fractions for a total dose of 40.05 Gy. Boost, 2 Gy in 5 fractions for a total dose of 10 Gy  . Hot flashes   . 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 Migraine   . Palpitations   . Pneumonia    with cavitation of left lower lobe  . Tuberculosis    exposure as a child, get checked frequently  . Uterine fibroid     Patient Active Problem List   Diagnosis Date Noted  . Chemotherapy-induced peripheral neuropathy ([facility]) 12/17/2017  . Port-A-Cath in  place 09/24/2017  . Malignant neoplasm of upper-outer quadrant of right breast in female, estrogen receptor negative ([facility]) 07/03/2017  . Pelvic pain in female 12/21/2016  . Fibroids, intramural 12/21/2016  . Post-operative state 12/19/2016  . Nocturia 08/09/2015  . Fibromyalgia 06/25/2015  . GERD (gastroesophageal reflux disease) 06/16/2013  . Migraine 06/16/2013  . Atrial tachycardia, paroxysmal ([facility]) 03/31/2013  . Headache 12/20/2012  . Syncope 06/20/2012  . Fibroids 03/26/2012  . Family history of breast cancer 01/22/2012  . Seasonal and perennial allergic rhinitis 07/18/2010  . HAIR LOSS 07/04/2010  . ANXIETY DISORDER, SITUATIONAL, MILD 11/17/2009  . CHEST PAIN, LEFT 10/12/2009  . DYSURIA 08/26/2009  . MAMMOGRAM, ABNORMAL, RIGHT, HX OF 04/09/2009  . TOBACCO ABUSE 03/17/2009  . DECREASED HEARING, RIGHT EAR 10/27/2008  . BACK PAIN, LUMBAR 06/06/2007  . HOT FLASHES 07/18/2006    Past Surgical History:  Procedure Laterality Date  . ABDOMINAL HYSTERECTOMY    . BREAST LUMPECTOMY WITH RADIOACTIVE SEED AND SENTINEL LYMPH NODE BIOPSY Right 07/24/2017   Procedure: RIGHT BREAST LUMPECTOMY WITH RADIOACTIVE SEED AND SENTINEL LYMPH NODE BIOPSY;  Surgeon: Erroll Luna, MD;  Location: [facility];  Service: General;  Laterality: Right;  . PORTACATH PLACEMENT Right 07/24/2017   Procedure: INSERTION PORT-A-CATH;  Surgeon: Erroll Luna, MD;  Location: [facility];  Service: General;  Laterality: Right;  . TUBAL LIGATION  1993  .  VAGINAL HYSTERECTOMY Bilateral 12/19/2016   Procedure: HYSTERECTOMY VAGINAL;  Surgeon: Lavonia Drafts, MD;  Location: [name];  Service: Gynecology;  Laterality: Bilateral;  . WISDOM TOOTH EXTRACTION       OB History    Gravida  2   Para  2   Term  1   Preterm  1   AB      Living  2     SAB      TAB      Ectopic      Multiple      Live Births               Home Medications    Prior to Admission  medications   Medication Sig Start Date End Date Taking? Authorizing Provider  albuterol (PROVENTIL HFA;VENTOLIN HFA) 108 (90 Base) MCG/ACT inhaler Inhale 2 puffs into the lungs every 6 (six) hours as needed for wheezing or shortness of breath.    [provider]  famotidine (PEPCID) 20 MG tablet Take 1 tablet (20 mg total) by mouth 2 (two) times daily. 01/28/18   Nicholas Lose, MD  gabapentin (NEURONTIN) 300 MG capsule Take 1 capsule (300 mg total) by mouth at bedtime. 06/24/18   Nicholas Lose, MD  ibuprofen (ADVIL,MOTRIN) 600 MG tablet Take 1 tablet (600 mg total) by mouth every 6 (six) hours as needed. 11/17/17   Noemi Chapel, MD  ipratropium (ATROVENT) 0.03 % nasal spray SPRAY TWO SQUIRTS INTO EACH NOSTRIL TWICE A DAY 07/09/17   [provider]  lidocaine-prilocaine (EMLA) cream Apply to affected area once 01/28/18   Nicholas Lose, MD  metoprolol succinate (TOPROL XL) 25 MG 24 hr tablet Take 1 tablet (25 mg total) by mouth daily. 06/05/18   Larey Dresser, MD  pantoprazole (PROTONIX) 40 MG tablet Take 40 mg by mouth daily.    [provider]  prochlorperazine (COMPAZINE) 10 MG tablet Take 1 tablet (10 mg total) by mouth every 6 (six) hours as needed (Nausea or vomiting). 06/03/18   Nicholas Lose, MD  diltiazem (CARDIZEM SR) 120 MG 12 hr capsule Take 1 capsule (120 mg total) by mouth daily. 05/15/11 12/17/11  Fay Records, MD    Family History Family History  Problem Relation Age of Onset  . Migraines Daughter   . Hypertension Mother   . Heart disease Mother   . Hypertension Father   . Heart disease Father   . Breast cancer Sister 37       deceased at age 33  . Migraines Son   . Hypertension Unknown   . Colon cancer Neg Hx   . Colon polyps Neg Hx   . Diabetes Neg Hx   . Kidney disease Neg Hx   . Liver disease Neg Hx   . Heart attack Neg Hx   . Stroke Neg Hx     Social History Social History   Tobacco Use  . Smoking status: Former Smoker    Packs/day:  0.50    Types: Cigarettes    Last attempt to quit: 07/16/2017    Years since quitting: 1.0  . Smokeless tobacco: Never Used  . Tobacco comment: smokes 1cigarette per day and more if she is stressed  Substance Use Topics  . Alcohol use: No    Alcohol/week: 0.0 standard drinks  . Drug use: Yes    Types: Marijuana    Comment: occasional, for pain. states she hasnt used for a mont; 08/02/17 sometimes     Allergies  Morphine; Shellfish allergy; Penicillins; Codeine; Hydrocodone; and Tomato   Review of Systems Review of Systems  Constitutional: Positive for chills. Negative for diaphoresis, fatigue, fever and malaise/fatigue.  HENT: Negative for congestion.   Eyes: Negative for visual disturbance.  Respiratory: Positive for cough and shortness of breath. Negative for choking, chest tightness, wheezing and stridor.   Cardiovascular: Positive for chest pain. Negative for palpitations, leg swelling, syncope and near-syncope.  Gastrointestinal: Negative for abdominal pain, constipation, diarrhea, nausea and vomiting.  Genitourinary: Negative for dysuria, enuresis and flank pain.  Musculoskeletal: Positive for back pain. Negative for neck pain and neck stiffness.  Skin: Negative for rash and wound.  Neurological: Negative for dizziness, light-headedness and headaches.  Psychiatric/Behavioral: Negative for agitation.  All other systems reviewed and are negative.    Physical Exam Updated Vital Signs BP (!) 135/93 (BP Location: Right Arm)   Pulse 70   Temp 98.8 F (37.1 C) (Oral)   Resp 18   Wt 72.6 kg   LMP 11/13/2016   SpO2 100%   BMI 26.63 kg/m   Physical Exam  Constitutional: She is oriented to person, place, and time. She appears well-developed and well-nourished.  Non-toxic appearance. She does not appear ill. No distress.  HENT:  Head: Normocephalic and atraumatic.  Right Ear: External ear normal.  Left Ear: External ear normal.  Nose: Nose normal.  Mouth/Throat:  Oropharynx is clear and moist. No oropharyngeal exudate.  Eyes: Pupils are equal, round, and reactive to light. Conjunctivae and EOM are normal.  Neck: Normal range of motion. Neck supple.  Cardiovascular: Normal rate and regular rhythm.  Pulmonary/Chest: No stridor. No respiratory distress. She has no decreased breath sounds. She has no wheezes. She has no rhonchi. She has no rales. She exhibits no tenderness.  Abdominal: She exhibits no distension. There is no tenderness. There is no rebound.  Musculoskeletal: She exhibits tenderness. She exhibits no edema.       Thoracic back: She exhibits tenderness and pain.       Back:  Neurological: She is alert and oriented to person, place, and time. She has normal reflexes. No sensory deficit. She exhibits normal muscle tone. Coordination normal.  Skin: Skin is warm. Capillary refill takes less than 2 seconds. No rash noted. She is not diaphoretic. No erythema.  Psychiatric: She has a normal mood and affect.  Nursing note and vitals reviewed.    ED Treatments / Results  Labs (all labs ordered are listed, but only abnormal results are displayed) Labs Reviewed  URINALYSIS, ROUTINE W REFLEX MICROSCOPIC - Abnormal; Notable for the following components:      Result Value   Ketones, ur 20 (*)    All other components within normal limits  URINE CULTURE  BASIC METABOLIC PANEL  CBC  HEPATIC FUNCTION PANEL  LIPASE, BLOOD  I-STAT TROPONIN, ED  I-STAT BETA HCG BLOOD, ED ([facility], [facility], [facility] ONLY)    EKG EKG Interpretation  Date/Time:  Wednesday July 24 2018 15:15:31 EDT Ventricular Rate:  71 PR Interval:  168 QRS Duration: 86 QT Interval:  378 QTC Calculation: 410 R Axis:   -140 Text Interpretation:  Normal sinus rhythm Indeterminate axis Septal infarct , age undetermined Abnormal ECG When compared to prior, no significant cahnges seen.  No STEMI Confirmed by Antony Blackbird [phone]) on 07/24/2018 4:00:48 PM   Radiology Ct Angio Chest Pe W  And/or Wo Contrast  Result Date: 07/24/2018 CLINICAL DATA:  47 year old female with history of breast cancer presenting with left-sided pleuritic chest  pain. Left posterior rib pain. EXAM: CT ANGIOGRAPHY CHEST WITH CONTRAST TECHNIQUE: Multidetector CT imaging of the chest was performed using the standard protocol during bolus administration of intravenous contrast. Multiplanar CT image reconstructions and MIPs were obtained to evaluate the vascular anatomy. CONTRAST:  174mL ISOVUE-370 IOPAMIDOL (ISOVUE-370) INJECTION 76% COMPARISON:  Chest CT dated 03/25/2018 FINDINGS: Cardiovascular: There is no cardiomegaly or pericardial effusion. The thoracic aorta is unremarkable. There is no CT evidence of pulmonary embolism. Mediastinum/Nodes: No hilar or mediastinal adenopathy. Top-normal right hilar lymph node measures 12 mm. Esophagus is grossly unremarkable. Residual thymic tissue in the anterior mediastinum most consistent with hyperplasia or rebound. No mediastinal fluid collection. Lungs/Pleura: Minimal left lung base atelectasis/scarring. The lungs are clear. There is no pleural effusion pneumothorax. The central airways are patent. Upper Abdomen: No acute abnormality. Musculoskeletal: No chest wall abnormality. No acute or significant osseous findings. Review of the MIP images confirms the above findings. IMPRESSION: 1. No acute intrathoracic pathology. No CT evidence of pulmonary embolism. 2. No evidence of metastatic disease in the chest. Electronically Signed   By: Anner Crete M.D.   On: 07/24/2018 21:20    Procedures Procedures (including critical care time)  EMERGENCY DEPARTMENT  US GUIDANCE EXAM Emergency Ultrasound:  US Guidance for Needle Guidance  INDICATIONS: Difficult vascular access Linear probe used in real-time to visualize location of needle entry through skin.   PERFORMED BY: Myself IMAGES ARCHIVED?: Yes LIMITATIONS: scar tissue VIEWS USED: Transverse INTERPRETATION: Needle  visualized within vein and Right arm 18 gauge   Medications Ordered in ED Medications  iopamidol (ISOVUE-370) 76 % injection (has no administration in time range)  morphine 4 MG/ML injection 4 mg (4 mg Intravenous Given 07/24/18 1720)  ondansetron (ZOFRAN) injection 4 mg (4 mg Intravenous Given 07/24/18 1719)  iopamidol (ISOVUE-370) 76 % injection 100 mL (100 mLs Intravenous Contrast Given 07/24/18 2031)  morphine 4 MG/ML injection 4 mg (4 mg Intravenous Given 07/24/18 1959)  oxyCODONE-acetaminophen (PERCOCET/ROXICET) 5-325 MG per tablet 1 tablet (1 tablet Oral Given 07/25/18 0001)     Initial Impression / Assessment and Plan / ED Course  I have reviewed the triage vital signs and the nursing notes.  Pertinent labs & imaging results that were available during my care of the patient were reviewed by me and considered in my medical decision making (see chart for details).     Katherine [name] is a 47 y.o. female with a past medical history significant for breast cancer on radiation and chemotherapy, GERD, asthma, PTSD, and headaches who presents with left lateral torso and back pain, chills, nausea, shortness of breath, and fatigue.  Patient reports that for the last few days she has been having the symptoms.  She describes the pain as in her left lateral back and lateral chest wall.  She describes as 10 out of 10.  It is sharp and extremely pleuritic.  Her skin is very sensitive to the touch.  She denies any rashes or history of shingles.  She denies trauma.  She reports no fevers at home being documented but she does have chills and sweats.  She describes that she has had some cough and had the taste of blood at one point after coughing.  Possible occult hemoptysis.  Patient denies any urinary symptoms or constipation or diarrhea.  She reports some nausea but no vomiting.  She reports associated shortness of breath with it.  Due to all these factors patient presented for evaluation.  On exam,  patient had  extreme tenderness in her left lateral chest wall and left back.  No anterior chest wall tenderness.  Lungs clear and no murmur was appreciated.  Patient has her surgery site from the port on her right chest that is nontender.  Symmetric pulses in her upper extremities.  Abdomen nontender.  Legs not edematous and nontender.  Clinically I am most concerned about a pulmonary embolism given her cancer, pleuritic pain shortness of breath.  Patient will have a PE study to further evaluate.  Patient will lab testing and will evaluate for pneumo thorax, pneumonia, or metastasize cancer.  Given the sensitivity to the skin, also considering irritation of the nerves from radiation or chemotherapy.  Also with a cough and consider muscular skeletal pain.  Next  Anticipate reassessment after work-up.  Patient reports that she can tolerate morphine despite with the chart says and will be given pain medicine and nausea medicine initially.  Anticipate reassessment.  Ultrasound IV placed by me for CT scan.  Patient's work-up was reassuring.  No evidence of PE, pneumonia, fluid overload, or other thoracic abnormality causing pain.  No evidence of infection.  Patient reports pain improved with medications.  Suspect patient has muscular skeletal pain given the chest wall tenderness in the setting of her coughing.  Patient will be given for pain medication and follow-up with PCP.  Patient voiced understanding of plan of care and was feeling better.  Patient discharged in good condition with improved symptoms.   Final Clinical Impressions(s) / ED Diagnoses   Final diagnoses:  Chest wall pain  Acute left-sided back pain, unspecified back location  Cough    ED Discharge Orders         Ordered    oxyCODONE-acetaminophen (PERCOCET/ROXICET) 5-325 MG tablet  Every 4 hours PRN     07/24/18 2334          Clinical Impression: 1. Chest wall pain   2. Acute left-sided back pain, unspecified back  location   3. Cough     Disposition: Discharge  Condition: Good  I have discussed the results, Dx and Tx plan with the pt(& family if present). He/she/they expressed understanding and agree(s) with the plan. Discharge instructions discussed at great length. Strict return precautions discussed and pt &/or family have verbalized understanding of the instructions. No further questions at time of discharge.    Discharge Medication List as of 07/24/2018 11:34 PM    START taking these medications   Details  oxyCODONE-acetaminophen (PERCOCET/ROXICET) 5-325 MG tablet Take 1 tablet by mouth every 4 (four) hours as needed for severe pain., Starting Wed 07/24/2018, Print        Follow Up: Javier Docker, MD [address] [employer] [place] [address] [facility] EMERGENCY DEPARTMENT [address] [id number] mc [place]       Abby Stines, Gwenyth Allegra, MD 07/25/18 0301    Madyson Lukach, Gwenyth Allegra, MD 07/25/18 [phone]

## 2018-07-24 NOTE — ED Triage Notes (Signed)
Pt presents with L posterior rib pain with cough x 1 wk with some production; pt also getting chemo for breast cancer (right); pt also reports subjective fevers

## 2018-07-24 NOTE — Discharge Instructions (Signed)
Your work-up today did not show evidence of pneumonia, blood clot, or evidence of a cardiac cause of your lateral chest wall and back pain.  I suspect it was pain in the chest wall due to your coughing and recent treatments.  Please use the pain medicine to help with your symptoms as it is help today.  Please stay hydrated and follow-up with your primary care team and your oncology team.  If any symptoms change or worsen, please return to the nearest emergency department.

## 2018-07-26 LAB — URINE CULTURE

## 2018-08-05 ENCOUNTER — Inpatient Hospital Stay: Payer: Medicaid Other

## 2018-08-05 ENCOUNTER — Inpatient Hospital Stay: Payer: Medicaid Other | Attending: Hematology and Oncology

## 2018-08-05 VITALS — BP 141/82 | HR 60 | Temp 98.6°F | Resp 16 | Ht 65.0 in | Wt 160.2 lb

## 2018-08-05 DIAGNOSIS — Z923 Personal history of irradiation: Secondary | ICD-10-CM | POA: Insufficient documentation

## 2018-08-05 DIAGNOSIS — Z5112 Encounter for antineoplastic immunotherapy: Secondary | ICD-10-CM | POA: Insufficient documentation

## 2018-08-05 DIAGNOSIS — Z171 Estrogen receptor negative status [ER-]: Secondary | ICD-10-CM | POA: Insufficient documentation

## 2018-08-05 DIAGNOSIS — Z9221 Personal history of antineoplastic chemotherapy: Secondary | ICD-10-CM | POA: Insufficient documentation

## 2018-08-05 DIAGNOSIS — Z803 Family history of malignant neoplasm of breast: Secondary | ICD-10-CM

## 2018-08-05 DIAGNOSIS — C50411 Malignant neoplasm of upper-outer quadrant of right female breast: Secondary | ICD-10-CM | POA: Diagnosis not present

## 2018-08-05 DIAGNOSIS — Z95828 Presence of other vascular implants and grafts: Secondary | ICD-10-CM

## 2018-08-05 LAB — CBC WITH DIFFERENTIAL (CANCER CENTER ONLY)
Abs Immature Granulocytes: 0.01 10*3/uL (ref 0.00–0.07)
BASOS PCT: 1 %
Basophils Absolute: 0 10*3/uL (ref 0.0–0.1)
EOS ABS: 0.2 10*3/uL (ref 0.0–0.5)
Eosinophils Relative: 5 %
HCT: 35.9 % — ABNORMAL LOW (ref 36.0–46.0)
Hemoglobin: 11.5 g/dL — ABNORMAL LOW (ref 12.0–15.0)
IMMATURE GRANULOCYTES: 0 %
Lymphocytes Relative: 41 %
Lymphs Abs: 1.7 10*3/uL (ref 0.7–4.0)
MCH: 26.9 pg (ref 26.0–34.0)
MCHC: 32 g/dL (ref 30.0–36.0)
MCV: 84.1 fL (ref 80.0–100.0)
MONOS PCT: 8 %
Monocytes Absolute: 0.3 10*3/uL (ref 0.1–1.0)
NEUTROS ABS: 1.9 10*3/uL (ref 1.7–7.7)
NEUTROS PCT: 45 %
PLATELETS: 200 10*3/uL (ref 150–400)
RBC: 4.27 MIL/uL (ref 3.87–5.11)
RDW: 14.5 % (ref 11.5–15.5)
WBC: 4.1 10*3/uL (ref 4.0–10.5)
nRBC: 0 % (ref 0.0–0.2)

## 2018-08-05 LAB — CMP (CANCER CENTER ONLY)
ALT: 14 U/L (ref 0–44)
AST: 16 U/L (ref 15–41)
Albumin: 3.8 g/dL (ref 3.5–5.0)
Alkaline Phosphatase: 77 U/L (ref 38–126)
Anion gap: 8 (ref 5–15)
BUN: 12 mg/dL (ref 6–20)
CHLORIDE: 107 mmol/L (ref 98–111)
CO2: 27 mmol/L (ref 22–32)
Calcium: 8.7 mg/dL — ABNORMAL LOW (ref 8.9–10.3)
Creatinine: 0.75 mg/dL (ref 0.44–1.00)
Glucose, Bld: 103 mg/dL — ABNORMAL HIGH (ref 70–99)
POTASSIUM: 3.4 mmol/L — AB (ref 3.5–5.1)
SODIUM: 142 mmol/L (ref 135–145)
Total Bilirubin: 0.2 mg/dL — ABNORMAL LOW (ref 0.3–1.2)
Total Protein: 6.6 g/dL (ref 6.5–8.1)

## 2018-08-05 MED ORDER — DIPHENHYDRAMINE HCL 25 MG PO CAPS
50.0000 mg | ORAL_CAPSULE | Freq: Once | ORAL | Status: AC
  Administered 2018-08-05: 50 mg via ORAL

## 2018-08-05 MED ORDER — ACETAMINOPHEN 325 MG PO TABS
ORAL_TABLET | ORAL | Status: AC
  Filled 2018-08-05: qty 2

## 2018-08-05 MED ORDER — HEPARIN SOD (PORK) LOCK FLUSH 100 UNIT/ML IV SOLN
500.0000 [IU] | Freq: Once | INTRAVENOUS | Status: AC | PRN
  Administered 2018-08-05: 500 [IU]
  Filled 2018-08-05: qty 5

## 2018-08-05 MED ORDER — DIPHENHYDRAMINE HCL 25 MG PO CAPS
ORAL_CAPSULE | ORAL | Status: AC
  Filled 2018-08-05: qty 2

## 2018-08-05 MED ORDER — SODIUM CHLORIDE 0.9% FLUSH
10.0000 mL | INTRAVENOUS | Status: DC | PRN
  Administered 2018-08-05: 10 mL
  Filled 2018-08-05: qty 10

## 2018-08-05 MED ORDER — SODIUM CHLORIDE 0.9 % IV SOLN
Freq: Once | INTRAVENOUS | Status: AC
  Administered 2018-08-05: 09:00:00 via INTRAVENOUS
  Filled 2018-08-05: qty 250

## 2018-08-05 MED ORDER — ACETAMINOPHEN 325 MG PO TABS
650.0000 mg | ORAL_TABLET | Freq: Once | ORAL | Status: AC
  Administered 2018-08-05: 650 mg via ORAL

## 2018-08-05 MED ORDER — TRASTUZUMAB CHEMO 150 MG IV SOLR
450.0000 mg | Freq: Once | INTRAVENOUS | Status: AC
  Administered 2018-08-05: 450 mg via INTRAVENOUS
  Filled 2018-08-05: qty 21.43

## 2018-08-05 MED ORDER — SODIUM CHLORIDE 0.9% FLUSH
10.0000 mL | INTRAVENOUS | Status: DC | PRN
  Administered 2018-08-05: 10 mL via INTRAVENOUS
  Filled 2018-08-05: qty 10

## 2018-08-05 NOTE — Patient Instructions (Signed)
[facility] Discharge Instructions for Patients Receiving Chemotherapy Today you received the following chemotherapy agents:  Herceptin To help prevent nausea and vomiting after your treatment, we encourage you to take your nausea medication as prescribed.   If you develop nausea and vomiting that is not controlled by your nausea medication, call the clinic.   BELOW ARE SYMPTOMS THAT SHOULD BE REPORTED IMMEDIATELY:  *FEVER GREATER THAN 100.5 F  *CHILLS WITH OR WITHOUT FEVER  NAUSEA AND VOMITING THAT IS NOT CONTROLLED WITH YOUR NAUSEA MEDICATION  *UNUSUAL SHORTNESS OF BREATH  *UNUSUAL BRUISING OR BLEEDING  TENDERNESS IN MOUTH AND THROAT WITH OR WITHOUT PRESENCE OF ULCERS  *URINARY PROBLEMS  *BOWEL PROBLEMS  UNUSUAL RASH Items with * indicate a potential emergency and should be followed up as soon as possible.  Feel free to call the clinic should you have any questions or concerns. The clinic phone number is [phone].  Please show the CHEMO ALERT CARD at check-in to the Emergency Department and triage nurse.   

## 2018-08-06 ENCOUNTER — Telehealth: Payer: Self-pay

## 2018-08-06 NOTE — Telephone Encounter (Signed)
LVM for patient informing of slightly low potassium.  Recommendation to increase potassium in diet.  Patient asked to call back with further questions/concerns.

## 2018-08-06 NOTE — Telephone Encounter (Signed)
-----   Message from Gardenia Phlegm, NP sent at 08/05/2018  6:36 PM EST ----- Potassium slightly low, recommend increased potassium in diet ----- Message ----- From: Interface, Lab In [place] Sent: 08/05/2018   8:46 AM EST To: Nicholas Lose, MD

## 2018-08-26 ENCOUNTER — Inpatient Hospital Stay: Payer: Medicaid Other

## 2018-08-26 ENCOUNTER — Inpatient Hospital Stay: Payer: Medicaid Other | Attending: Hematology and Oncology

## 2018-08-26 ENCOUNTER — Telehealth: Payer: Self-pay | Admitting: Hematology and Oncology

## 2018-08-26 ENCOUNTER — Inpatient Hospital Stay (HOSPITAL_BASED_OUTPATIENT_CLINIC_OR_DEPARTMENT_OTHER): Payer: Medicaid Other | Admitting: Hematology and Oncology

## 2018-08-26 DIAGNOSIS — Z791 Long term (current) use of non-steroidal anti-inflammatories (NSAID): Secondary | ICD-10-CM | POA: Diagnosis not present

## 2018-08-26 DIAGNOSIS — Z9221 Personal history of antineoplastic chemotherapy: Secondary | ICD-10-CM

## 2018-08-26 DIAGNOSIS — C50411 Malignant neoplasm of upper-outer quadrant of right female breast: Secondary | ICD-10-CM | POA: Diagnosis not present

## 2018-08-26 DIAGNOSIS — M898X9 Other specified disorders of bone, unspecified site: Secondary | ICD-10-CM | POA: Diagnosis not present

## 2018-08-26 DIAGNOSIS — G629 Polyneuropathy, unspecified: Secondary | ICD-10-CM | POA: Diagnosis not present

## 2018-08-26 DIAGNOSIS — Z79899 Other long term (current) drug therapy: Secondary | ICD-10-CM | POA: Insufficient documentation

## 2018-08-26 DIAGNOSIS — Z171 Estrogen receptor negative status [ER-]: Principal | ICD-10-CM

## 2018-08-26 DIAGNOSIS — R634 Abnormal weight loss: Secondary | ICD-10-CM | POA: Insufficient documentation

## 2018-08-26 DIAGNOSIS — Z5112 Encounter for antineoplastic immunotherapy: Secondary | ICD-10-CM | POA: Diagnosis present

## 2018-08-26 DIAGNOSIS — Z803 Family history of malignant neoplasm of breast: Secondary | ICD-10-CM

## 2018-08-26 DIAGNOSIS — Z923 Personal history of irradiation: Secondary | ICD-10-CM | POA: Diagnosis not present

## 2018-08-26 DIAGNOSIS — Z95828 Presence of other vascular implants and grafts: Secondary | ICD-10-CM

## 2018-08-26 LAB — CMP (CANCER CENTER ONLY)
ALT: 13 U/L (ref 0–44)
AST: 15 U/L (ref 15–41)
Albumin: 4.2 g/dL (ref 3.5–5.0)
Alkaline Phosphatase: 86 U/L (ref 38–126)
Anion gap: 9 (ref 5–15)
BUN: 11 mg/dL (ref 6–20)
CHLORIDE: 107 mmol/L (ref 98–111)
CO2: 28 mmol/L (ref 22–32)
Calcium: 9.5 mg/dL (ref 8.9–10.3)
Creatinine: 0.75 mg/dL (ref 0.44–1.00)
GFR, Estimated: >60 mL/min (ref >60)
Glucose, Bld: 96 mg/dL (ref 70–99)
POTASSIUM: 3.5 mmol/L (ref 3.5–5.1)
Sodium: 144 mmol/L (ref 135–145)
Total Bilirubin: 0.4 mg/dL (ref 0.3–1.2)
Total Protein: 7.5 g/dL (ref 6.5–8.1)

## 2018-08-26 LAB — CBC WITH DIFFERENTIAL (CANCER CENTER ONLY)
Abs Immature Granulocytes: 0.01 10*3/uL (ref 0.00–0.07)
BASOS ABS: 0 10*3/uL (ref 0.0–0.1)
Basophils Relative: 1 %
Eosinophils Absolute: 0.1 10*3/uL (ref 0.0–0.5)
Eosinophils Relative: 3 %
HEMATOCRIT: 40 % (ref 36.0–46.0)
HEMOGLOBIN: 12.6 g/dL (ref 12.0–15.0)
Immature Granulocytes: 0 %
LYMPHS PCT: 47 %
Lymphs Abs: 2 10*3/uL (ref 0.7–4.0)
MCH: 26.4 pg (ref 26.0–34.0)
MCHC: 31.5 g/dL (ref 30.0–36.0)
MCV: 83.9 fL (ref 80.0–100.0)
Monocytes Absolute: 0.3 10*3/uL (ref 0.1–1.0)
Monocytes Relative: 6 %
NEUTROS ABS: 1.9 10*3/uL (ref 1.7–7.7)
NEUTROS PCT: 43 %
NRBC: 0 % (ref 0.0–0.2)
Platelet Count: 207 10*3/uL (ref 150–400)
RBC: 4.77 MIL/uL (ref 3.87–5.11)
RDW: 14.3 % (ref 11.5–15.5)
WBC: 4.3 10*3/uL (ref 4.0–10.5)

## 2018-08-26 MED ORDER — DIPHENHYDRAMINE HCL 25 MG PO CAPS
50.0000 mg | ORAL_CAPSULE | Freq: Once | ORAL | Status: AC
  Administered 2018-08-26: 50 mg via ORAL

## 2018-08-26 MED ORDER — DIPHENHYDRAMINE HCL 25 MG PO CAPS
ORAL_CAPSULE | ORAL | Status: AC
  Filled 2018-08-26: qty 2

## 2018-08-26 MED ORDER — HEPARIN SOD (PORK) LOCK FLUSH 100 UNIT/ML IV SOLN
500.0000 [IU] | Freq: Once | INTRAVENOUS | Status: AC | PRN
  Administered 2018-08-26: 500 [IU]
  Filled 2018-08-26: qty 5

## 2018-08-26 MED ORDER — DULOXETINE HCL 20 MG PO CPEP: 20.0000 mg | ORAL_CAPSULE | Freq: Every day | ORAL | 3 refills | Status: DC

## 2018-08-26 MED ORDER — ACETAMINOPHEN 325 MG PO TABS
650.0000 mg | ORAL_TABLET | Freq: Once | ORAL | Status: AC
  Administered 2018-08-26: 650 mg via ORAL

## 2018-08-26 MED ORDER — SODIUM CHLORIDE 0.9% FLUSH
10.0000 mL | INTRAVENOUS | Status: DC | PRN
  Administered 2018-08-26: 10 mL
  Filled 2018-08-26: qty 10

## 2018-08-26 MED ORDER — SODIUM CHLORIDE 0.9% FLUSH
10.0000 mL | INTRAVENOUS | Status: DC | PRN
  Administered 2018-08-26: 10 mL via INTRAVENOUS
  Filled 2018-08-26: qty 10

## 2018-08-26 MED ORDER — ACETAMINOPHEN 325 MG PO TABS
ORAL_TABLET | ORAL | Status: AC
  Filled 2018-08-26: qty 2

## 2018-08-26 MED ORDER — TRASTUZUMAB CHEMO 150 MG IV SOLR
450.0000 mg | Freq: Once | INTRAVENOUS | Status: AC
  Administered 2018-08-26: 450 mg via INTRAVENOUS
  Filled 2018-08-26: qty 21.43

## 2018-08-26 MED ORDER — OXYCODONE-ACETAMINOPHEN 5-325 MG PO TABS: 1.0000 | ORAL_TABLET | Freq: Three times a day (TID) | ORAL | 0 refills | Status: DC | PRN

## 2018-08-26 MED ORDER — SODIUM CHLORIDE 0.9 % IV SOLN
Freq: Once | INTRAVENOUS | Status: AC
  Administered 2018-08-26: 10:00:00 via INTRAVENOUS
  Filled 2018-08-26: qty 250

## 2018-08-26 NOTE — Progress Notes (Signed)
Patient Care Team: Javier Docker, MD as PCP - General (Internal Medicine) Erroll Luna, MD as Consulting Physician (General Surgery)  DIAGNOSIS:  Encounter Diagnosis  Name Primary?  . Malignant neoplasm of upper-outer quadrant of right breast in female, estrogen receptor negative (Katherine [name])     SUMMARY OF ONCOLOGIC HISTORY:   Malignant neoplasm of upper-outer quadrant of right breast in female, estrogen receptor negative (Katherine [name])   06/26/2017 Initial Diagnosis    Right breast biopsy 8:30 position 8 cm from nipple: IDC with DCIS, second biopsy 6 cm from nipple fibrocystic changes: Grade 3, ER 0%, PR 0%, Ki-67 40%, HER-2 positive ratio 3.59; mammogram and ultrasound revealed 1.9 cm mass at 8:30 position, adjacent 0.7 cm cystic mass, T1c N0 stage IA AJCC 8     07/24/2017 Surgery    Right lumpectomy: IDC grade 3, 1.8 cm, 0/2 lymph nodes negative,ER 0%, PR 0%, Ki-67 40%, HER-2 positive ratio 3.59, T1CN0 stage I a     09/10/2017 - 11/26/2017 Chemotherapy    Taxol Herceptin weekly x12 followed by Herceptin maintenance for 1 year    12/19/2017 - 01/15/2018 Radiation Therapy    Adjuvant radiation therapy     CHIEF COMPLIANT: Last Herceptin treatment  INTERVAL HISTORY: Katherine [name] is a 47-year with above-mentioned history of right breast cancer treated with lumpectomy adjuvant chemotherapy and radiation and today is her last of the Herceptin maintenance treatments.  Her biggest complaint is neuropathy which apparently is causing her pain.  I prescribed her gabapentin but apparently is causing stomach upset and she stopped taking it.  She received 15 tablets of Percocet from primary care doctor and apparently it helped her significantly and wants a refill on this medication.  REVIEW OF SYSTEMS:   Constitutional: Denies fevers, chills or abnormal weight loss Eyes: Denies blurriness of vision Ears, nose, mouth, throat, and face: Denies mucositis or sore throat Respiratory: Denies cough,  dyspnea or wheezes Cardiovascular: Denies palpitation, chest discomfort Gastrointestinal:  Denies nausea, heartburn or change in bowel habits Skin: Denies abnormal skin rashes Lymphatics: Denies new lymphadenopathy or easy bruising Neurological: Neuropathy in the feet Behavioral/Psych: Mood is stable, no new changes  Extremities: No lower extremity edema  All other systems were reviewed with the patient and are negative.  I have reviewed the past medical history, past surgical history, social history and family history with the patient and they are unchanged from previous note.  ALLERGIES:  is allergic to morphine; shellfish allergy; penicillins; codeine; hydrocodone; and tomato.  MEDICATIONS:  Current Outpatient Medications  Medication Sig Dispense Refill  . albuterol (PROVENTIL HFA;VENTOLIN HFA) 108 (90 Base) MCG/ACT inhaler Inhale 2 puffs into the lungs every 6 (six) hours as needed for wheezing or shortness of breath.    . famotidine (PEPCID) 20 MG tablet Take 1 tablet (20 mg total) by mouth 2 (two) times daily. 90 tablet 3  . gabapentin (NEURONTIN) 300 MG capsule Take 1 capsule (300 mg total) by mouth at bedtime. 30 capsule 3  . ibuprofen (ADVIL,MOTRIN) 600 MG tablet Take 1 tablet (600 mg total) by mouth every 6 (six) hours as needed. 30 tablet 0  . ipratropium (ATROVENT) 0.03 % nasal spray Place 2 sprays into both nostrils 2 (two) times daily.   1  . lidocaine-prilocaine (EMLA) cream Apply to affected area once 30 g 3  . metoprolol succinate (TOPROL XL) 25 MG 24 hr tablet Take 1 tablet (25 mg total) by mouth daily. 30 tablet 11  . oxyCODONE-acetaminophen (PERCOCET/ROXICET) 5-325 MG tablet  Take 1 tablet by mouth every 4 (four) hours as needed for severe pain. 15 tablet 0  . pantoprazole (PROTONIX) 40 MG tablet Take 40 mg by mouth daily.    . prochlorperazine (COMPAZINE) 10 MG tablet Take 1 tablet (10 mg total) by mouth every 6 (six) hours as needed (Nausea or vomiting). 30 tablet 1    No current facility-administered medications for this visit.     PHYSICAL EXAMINATION: ECOG PERFORMANCE STATUS: 1 - Symptomatic but completely ambulatory  Vitals:   08/26/18 0913  BP: (!) 131/94  Pulse: 61  Resp: 18  Temp: 98 F (36.7 C)  SpO2: 100%   Filed Weights   08/26/18 0913  Weight: 159 lb 12.8 oz (72.5 kg)    GENERAL:alert, no distress and comfortable SKIN: skin color, texture, turgor are normal, no rashes or significant lesions EYES: normal, Conjunctiva are pink and non-injected, sclera clear OROPHARYNX:no exudate, no erythema and lips, buccal mucosa, and tongue normal  NECK: supple, thyroid normal size, non-tender, without nodularity LYMPH:  no palpable lymphadenopathy in the cervical, axillary or inguinal LUNGS: clear to auscultation and percussion with normal breathing effort HEART: regular rate & rhythm and no murmurs and no lower extremity edema ABDOMEN:abdomen soft, non-tender and normal bowel sounds MUSCULOSKELETAL:no cyanosis of digits and no clubbing  NEURO: alert & oriented x 3 with fluent speech, peripheral neuropathy grade 2 EXTREMITIES: No lower extremity edema   LABORATORY DATA:  I have reviewed the data as listed CMP Latest Ref Rng & Units 08/05/2018 07/24/2018 07/15/2018  Glucose 70 - 99 mg/dL 103(H) 97 91  BUN 6 - 20 mg/dL _0 Creatinine 0.44 - 1.00 mg/dL 0.75 0.60 0.68  Sodium 135 - 145 mmol/L 142 137 142  Potassium 3.5 - 5.1 mmol/L 3.4(L) 3.6 3.6  Chloride 98 - 111 mmol/L 107 106 108  CO2 22 - 32 mmol/L _1 Calcium 8.9 - 10.3 mg/dL 8.7(L) 9.3 8.7(L)  Total Protein 6.5 - 8.1 g/dL 6.6 7.2 6.9  Total Bilirubin 0.3 - 1.2 mg/dL 0.2(L) 0.5 0.3  Alkaline Phos 38 - 126 U/L 77 66 83  AST 15 - 41 U/L _2 ALT 0 - 44 U/L _3 Lab Results  Component Value Date   WBC 4.1 08/05/2018   HGB 11.5 (L) 08/05/2018   HCT 35.9 (L) 08/05/2018   MCV 84.1 08/05/2018   PLT 200 08/05/2018   NEUTROABS 1.9 08/05/2018     ASSESSMENT & PLAN:  Malignant neoplasm of upper-outer quadrant of right breast in female, estrogen receptor negative (Katherine [name]) 07/24/2017:Right lumpectomy: IDC grade 3, 1.8 cm, 0/2 lymph nodes negative,ER 0%, PR 0%, Ki-67 40%, HER-2 positive ratio 3.59, T1CN0 stage I a  Recommendation: 1. Adjuvant chemotherapy with Taxol Herceptin completed 3/4/2019followed by Herceptin maintenance for 1 year 2. followed by adjuvant radiation therapy 12/19/2017 to 01/15/2018 ------------------------------------------------------------------------------------------------------------------------------------ Current treatment:Herceptin maintenance therapy. (Today is her last Herceptin treatment) Adjuvant radiation therapy. 12/19/2017 to 01/15/2018 Because she is ER PR negative there is no role of adjuvant antiestrogen therapy.  Unintentional weight loss and generalized bone pain: CT scans and bone scans did not show any evidence of metastatic disease. Peripheral neuropathy: I sent a prescription for Cymbalta.  We also gave her a few tablets of Percocet so that she can allow the Cymbalta to build up in her system to alleviate her discomfort.  I discussed with her that narcotics on a long-term basis can be addictive and that we do not  want to continue with these pain medications after today's prescription.  Return to clinic in 3 months for survivorship care plan visit    No orders of the defined types were placed in this encounter.  The patient has a good understanding of the overall plan. she agrees with it. she will call with any problems that may develop before the next visit here.   Harriette Ohara, MD 08/26/18

## 2018-08-26 NOTE — Telephone Encounter (Signed)
Called patient to let her know we set her up for her SCP plan visit.

## 2018-08-26 NOTE — Patient Instructions (Signed)
[facility] Discharge Instructions for Patients Receiving Chemotherapy Today you received the following chemotherapy agents:  Herceptin To help prevent nausea and vomiting after your treatment, we encourage you to take your nausea medication as prescribed.   If you develop nausea and vomiting that is not controlled by your nausea medication, call the clinic.   BELOW ARE SYMPTOMS THAT SHOULD BE REPORTED IMMEDIATELY:  *FEVER GREATER THAN 100.5 F  *CHILLS WITH OR WITHOUT FEVER  NAUSEA AND VOMITING THAT IS NOT CONTROLLED WITH YOUR NAUSEA MEDICATION  *UNUSUAL SHORTNESS OF BREATH  *UNUSUAL BRUISING OR BLEEDING  TENDERNESS IN MOUTH AND THROAT WITH OR WITHOUT PRESENCE OF ULCERS  *URINARY PROBLEMS  *BOWEL PROBLEMS  UNUSUAL RASH Items with * indicate a potential emergency and should be followed up as soon as possible.  Feel free to call the clinic should you have any questions or concerns. The clinic phone number is [phone].  Please show the CHEMO ALERT CARD at check-in to the Emergency Department and triage nurse.   

## 2018-08-26 NOTE — Assessment & Plan Note (Signed)
07/24/2017:Right lumpectomy: IDC grade 3, 1.8 cm, 0/2 lymph nodes negative,ER 0%, PR 0%, Ki-67 40%, HER-2 positive ratio 3.59, T1CN0 stage I a  Recommendation: 1. Adjuvant chemotherapy with Taxol Herceptin completed 3/4/2019followed by Herceptin maintenance for 1 year 2. followed by adjuvant radiation therapy 12/19/2017 to 01/15/2018 ------------------------------------------------------------------------------------------------------------------------------------ Current treatment:Herceptin maintenance therapy. (Today is her last Herceptin treatment) Adjuvant radiation therapy. 12/19/2017 to 01/15/2018 Because she is ER PR negative there is no role of adjuvant antiestrogen therapy.  Unintentional weight loss and generalized bone pain: CT scans and bone scans did not show any evidence of metastatic disease.  Return to clinic in 3 months for survivorship care plan visit

## 2018-08-29 ENCOUNTER — Encounter: Payer: Self-pay | Admitting: *Deleted

## 2018-09-05 ENCOUNTER — Ambulatory Visit (HOSPITAL_BASED_OUTPATIENT_CLINIC_OR_DEPARTMENT_OTHER)
Admission: RE | Admit: 2018-09-05 | Discharge: 2018-09-05 | Disposition: A | Discharge status: Home / Self Care | Payer: Medicaid Other | Source: Ambulatory Visit | Attending: Cardiology | Admitting: Cardiology

## 2018-09-05 ENCOUNTER — Encounter (HOSPITAL_COMMUNITY): Payer: Self-pay | Admitting: Cardiology

## 2018-09-05 ENCOUNTER — Ambulatory Visit (HOSPITAL_COMMUNITY)
Admission: RE | Admit: 2018-09-05 | Discharge: 2018-09-05 | Disposition: A | Discharge status: Home / Self Care | Payer: Medicaid Other | Source: Ambulatory Visit | Attending: Cardiology | Admitting: Cardiology

## 2018-09-05 VITALS — BP 120/82 | HR 60 | Wt 164.2 lb

## 2018-09-05 DIAGNOSIS — R002 Palpitations: Secondary | ICD-10-CM | POA: Diagnosis present

## 2018-09-05 DIAGNOSIS — I471 Supraventricular tachycardia: Secondary | ICD-10-CM | POA: Diagnosis not present

## 2018-09-05 DIAGNOSIS — I340 Nonrheumatic mitral (valve) insufficiency: Secondary | ICD-10-CM | POA: Insufficient documentation

## 2018-09-05 MED ORDER — METOPROLOL SUCCINATE ER 25 MG PO TB24: 25.0000 mg | ORAL_TABLET | Freq: Every day | ORAL | 11 refills | Status: DC

## 2018-09-05 NOTE — Progress Notes (Signed)
Oncology: Dr. Lindi Adie  47 y.o. was referred by Dr. Lindi Adie for cardio-oncology evaluation. She has breast cancer that was diagnosed on right in 10/18.  ER-/PR-/HER2+.  Right lumpectomy 10/18. She will had Taxol/Herceptin followed by Herceptin alone x 1 year, she just finished Herceptin.    She has a history of SVT.  I had her wear a 2 week event monitor in 9/19 that showed no significant arrhythmias.  I put her on Toprol XL but she has run out of it.  She still has periodic palpitations. No syncope or lightheadedness.    PMH: 1. H/o SVT - Event monitor (9/19): no significant arrhythmias.  2. Breast cancer: Diagnosed on right in 10/18.  ER-/PR-/HER2+.  Right lumpectomy 10/18. She will have Taxol/Herceptin followed by Herceptin alone x 1 year.  - Echo (11/18): EF 62-56%, normal diastolic function, GLS -38.9%.  - Echo (2/19): EF 37-34%, normal diastolic function, GLS -28.7% - Echo (9/19): EF 68-11%, grade 2 diastolic dysfunction, GLS -20.1% - Echo (12/19): EF 55-60%, GLS -21%, moderate diastolic dysfunction, mild MR.   Family History  Problem Relation Age of Onset  . Migraines Daughter   . Hypertension Mother   . Heart disease Mother   . Hypertension Father   . Heart disease Father   . Breast cancer Sister 66       deceased at age 6  . Migraines Son   . Hypertension Unknown   . Colon cancer Neg Hx   . Colon polyps Neg Hx   . Diabetes Neg Hx   . Kidney disease Neg Hx   . Liver disease Neg Hx   . Heart attack Neg Hx   . Stroke Neg Hx    Social History   Socioeconomic History  . Marital status: Married    Spouse name: Not on file  . Number of children: 2  . Years of education: 70  . Highest education level: Not on file  Occupational History  . Occupation: unemployed  Social Needs  . Financial resource strain: Not on file  . Food insecurity:    Worry: Not on file    Inability: Not on file  . Transportation needs:    Medical: Not on file    Non-medical: Not on file  Tobacco  Use  . Smoking status: Former Smoker    Packs/day: 0.50    Types: Cigarettes    Last attempt to quit: 07/16/2017    Years since quitting: 1.1  . Smokeless tobacco: Never Used  . Tobacco comment: smokes 1cigarette per day and more if she is stressed  Substance and Sexual Activity  . Alcohol use: No    Alcohol/week: 0.0 standard drinks  . Drug use: Yes    Types: Marijuana    Comment: occasional, for pain. states she hasnt used for a mont; 08/02/17 sometimes  . Sexual activity: Yes    Birth control/protection: Surgical  Lifestyle  . Physical activity:    Days per week: Not on file    Minutes per session: Not on file  . Stress: Not on file  Relationships  . Social connections:    Talks on phone: Not on file    Gets together: Not on file    Attends religious service: Not on file    Active member of club or organization: Not on file    Attends meetings of clubs or organizations: Not on file    Relationship status: Not on file  Other Topics Concern  . Not on file  Social  History Narrative   On disability for heart condition.  Pt. Was non-specific.       Patient does not drink caffeine.   Patient is ambidextrous, but mostly right handed.    12th grade, some college courses   ROS: All systems reviewed and negative except as per HPI  Current Outpatient Medications  Medication Sig Dispense Refill  . albuterol (PROVENTIL HFA;VENTOLIN HFA) 108 (90 Base) MCG/ACT inhaler Inhale 2 puffs into the lungs every 6 (six) hours as needed for wheezing or shortness of breath.    . DULoxetine (CYMBALTA) 20 MG capsule Take 1 capsule (20 mg total) by mouth daily. 30 capsule 3  . ibuprofen (ADVIL,MOTRIN) 600 MG tablet Take 1 tablet (600 mg total) by mouth every 6 (six) hours as needed. 30 tablet 0  . ipratropium (ATROVENT) 0.03 % nasal spray Place 2 sprays into both nostrils 2 (two) times daily.   1  . metoprolol succinate (TOPROL XL) 25 MG 24 hr tablet Take 1 tablet (25 mg total) by mouth daily. 30  tablet 11  . oxyCODONE-acetaminophen (PERCOCET/ROXICET) 5-325 MG tablet Take 1 tablet by mouth every 8 (eight) hours as needed for severe pain. 30 tablet 0  . prochlorperazine (COMPAZINE) 10 MG tablet Take 1 tablet (10 mg total) by mouth every 6 (six) hours as needed (Nausea or vomiting). 30 tablet 1   No current facility-administered medications for this encounter.    BP 120/82   Pulse 60   Wt 74.5 kg (164 lb 3.2 oz)   LMP 11/13/2016   SpO2 99%   BMI 27.32 kg/m  General: NAD Neck: No JVD, no thyromegaly or thyroid nodule.  Lungs: Clear to auscultation bilaterally with normal respiratory effort. CV: Nondisplaced PMI.  Heart regular S1/S2, no S3/S4, no murmur.  No peripheral edema.  No carotid bruit.  Normal pedal pulses.  Abdomen: Soft, nontender, no hepatosplenomegaly, no distention.  Skin: Intact without lesions or rashes.  Neurologic: Alert and oriented x 3.  Psych: Normal affect. Extremities: No clubbing or cyanosis.  HEENT: Normal.   Assessment/Plan: 1. Breast cancer: Patient has completed Herceptin.  I reviewed today's echo: normal LV EF and strain pattern.  She does not have to do any additional screening echoes.  2. SVT: 2 week event monitor did not show any significant arrhythmias in 9/19. Still has periodic palpitations.  - Restart Toprol XL 25 mg daily.   Now that she has finished Herceptin, she will followup with her primary cardiology, Dr. Harrington Challenger.   Loralie Champagne 09/05/2018

## 2018-09-05 NOTE — Progress Notes (Signed)
  Echocardiogram 2D Echocardiogram has been performed.  Katherine [name] 09/05/2018, 8:47 AM

## 2018-09-05 NOTE — Patient Instructions (Signed)
Follow up as needed

## 2018-12-04 ENCOUNTER — Telehealth: Payer: Self-pay

## 2018-12-04 NOTE — Telephone Encounter (Signed)
Spoke with patient to remind of SCP visit with NP on 12/09/18 at 2 pm.  Patient said she will come to appt.

## 2018-12-09 ENCOUNTER — Ambulatory Visit: Payer: Self-pay

## 2018-12-09 ENCOUNTER — Inpatient Hospital Stay: Payer: Medicaid Other | Admitting: Adult Health

## 2018-12-09 ENCOUNTER — Ambulatory Visit (HOSPITAL_COMMUNITY)
Admission: RE | Admit: 2018-12-09 | Discharge: 2018-12-09 | Disposition: A | Discharge status: Home / Self Care | Payer: Medicaid Other | Source: Ambulatory Visit | Attending: Adult Health | Admitting: Adult Health

## 2018-12-09 ENCOUNTER — Inpatient Hospital Stay: Payer: Medicaid Other | Attending: Hematology and Oncology | Admitting: Adult Health

## 2018-12-09 ENCOUNTER — Other Ambulatory Visit: Payer: Self-pay

## 2018-12-09 ENCOUNTER — Encounter: Payer: Self-pay | Admitting: Adult Health

## 2018-12-09 VITALS — BP 134/88 | HR 59 | Temp 98.0°F | Resp 18 | Ht 65.0 in | Wt 161.0 lb

## 2018-12-09 DIAGNOSIS — R350 Frequency of micturition: Secondary | ICD-10-CM

## 2018-12-09 DIAGNOSIS — Z79899 Other long term (current) drug therapy: Secondary | ICD-10-CM

## 2018-12-09 DIAGNOSIS — Z87891 Personal history of nicotine dependence: Secondary | ICD-10-CM | POA: Diagnosis not present

## 2018-12-09 DIAGNOSIS — M797 Fibromyalgia: Secondary | ICD-10-CM | POA: Diagnosis not present

## 2018-12-09 DIAGNOSIS — C50411 Malignant neoplasm of upper-outer quadrant of right female breast: Secondary | ICD-10-CM | POA: Insufficient documentation

## 2018-12-09 DIAGNOSIS — R531 Weakness: Secondary | ICD-10-CM | POA: Insufficient documentation

## 2018-12-09 DIAGNOSIS — Z791 Long term (current) use of non-steroidal anti-inflammatories (NSAID): Secondary | ICD-10-CM | POA: Diagnosis not present

## 2018-12-09 DIAGNOSIS — Z171 Estrogen receptor negative status [ER-]: Secondary | ICD-10-CM

## 2018-12-09 DIAGNOSIS — G629 Polyneuropathy, unspecified: Secondary | ICD-10-CM | POA: Diagnosis not present

## 2018-12-09 DIAGNOSIS — Z923 Personal history of irradiation: Secondary | ICD-10-CM

## 2018-12-09 DIAGNOSIS — M549 Dorsalgia, unspecified: Secondary | ICD-10-CM | POA: Insufficient documentation

## 2018-12-09 LAB — URINALYSIS, COMPLETE (UACMP) WITH MICROSCOPIC
Bacteria, UA: NONE SEEN
Bilirubin Urine: NEGATIVE
Glucose, UA: NEGATIVE mg/dL
Ketones, ur: NEGATIVE mg/dL
Leukocytes,Ua: NEGATIVE
Nitrite: NEGATIVE
Protein, ur: NEGATIVE mg/dL
Specific Gravity, Urine: 1.03 (ref 1.005–1.030)
pH: 5 (ref 5.0–8.0)

## 2018-12-09 MED ORDER — DULOXETINE HCL 30 MG PO CPEP: 30.0000 mg | ORAL_CAPSULE | Freq: Every day | ORAL | 0 refills | Status: DC

## 2018-12-09 NOTE — Progress Notes (Signed)
Right upper extremity venous duplex com,pleted. Preliminary results in Chart review CV Proc. [place] Garan Frappier,RVS 12/09/2018 4:13 PM

## 2018-12-09 NOTE — Progress Notes (Addendum)
CLINIC:  Survivorship   REASON FOR VISIT:  Routine follow-up post-treatment for a recent history of breast cancer.  BRIEF ONCOLOGIC HISTORY:    Malignant neoplasm of upper-outer quadrant of right breast in female, estrogen receptor negative ([place])   06/26/2017 Initial Diagnosis    Right breast biopsy 8:30 position 8 cm from nipple: IDC with DCIS, second biopsy 6 cm from nipple fibrocystic changes: Grade 3, ER 0%, PR 0%, Ki-67 40%, HER-2 positive ratio 3.59; mammogram and ultrasound revealed 1.9 cm mass at 8:30 position, adjacent 0.7 cm cystic mass, T1c N0 stage IA AJCC 8     07/24/2017 Surgery    Right lumpectomy: IDC grade 3, 1.8 cm, 0/2 lymph nodes negative,ER 0%, PR 0%, Ki-67 40%, HER-2 positive ratio 3.59, T1CN0 stage I a     09/10/2017 - 11/26/2017 Chemotherapy    Taxol Herceptin weekly x12 followed by Herceptin maintenance for 1 year    12/19/2017 - 01/15/2018 Radiation Therapy    Adjuvant radiation therapy     INTERVAL HISTORY:  Katherine [name] presents to the [facility] today for our initial meeting to review her survivorship care plan detailing her treatment course for breast cancer, as well as monitoring long-term side effects of that treatment, education regarding health maintenance, screening, and overall wellness and health promotion.     Katherine is not feeling well today.  She has continued peripheral neuropathy.  She notes it is intermittent in her fingertips and toes of her left side, and constant in the right side.  She also has progressively worsening weakness in her right arm and left leg.  She notes that she hasn't told anyone about it because she kept thinking it would improve.  She notes back pain in her mid spine.  She has undergone evaluations for this that have been negative.  She is requesting percocet.    At her last appt with Dr. Lindi Adie, he added cymbalta for her neuropathy and pain.  She hasn't noted a benefit from this at this point.      REVIEW OF SYSTEMS:   Review of Systems  Constitutional: Negative for appetite change, chills, fatigue, fever and unexpected weight change.  HENT:   Negative for hearing loss, lump/mass, sore throat and trouble swallowing.   Eyes: Negative for eye problems and icterus.  Respiratory: Negative for chest tightness, cough, hemoptysis and wheezing.   Cardiovascular: Negative for chest pain, leg swelling and palpitations.  Gastrointestinal: Negative for abdominal distention, abdominal pain, constipation, diarrhea, nausea and vomiting.  Endocrine: Negative for hot flashes.  Musculoskeletal: Negative for arthralgias.  Skin: Negative for itching and rash.  Neurological: Negative for dizziness, extremity weakness and numbness.  Hematological: Negative for adenopathy. Does not bruise/bleed easily.  Psychiatric/Behavioral: Negative for depression. The patient is not nervous/anxious.   Breast: Denies any new nodularity, masses, tenderness, nipple changes, or nipple discharge.      ONCOLOGY TREATMENT TEAM:  1. Surgeon:  Dr. Brantley Stage at [facility] 2. Medical Oncologist: Dr. Lindi Adie  3. Radiation Oncologist: Dr. Isidore Moos    PAST MEDICAL/SURGICAL HISTORY:  Past Medical History:  Diagnosis Date  . Anxiety   . Asthma   . Dysrhythmia    History of SVT-No medications  . Fibromyalgia   . GERD (gastroesophageal reflux disease)   . History of radiation therapy 12/19/17- 01/15/18   Right Breast, 2.67 Gy in 15 fractions for a total dose of 40.05 Gy. Boost, 2 Gy in 5 fractions for a total dose of 10 Gy  . Hot  flashes   . Malignant neoplasm of upper-outer quadrant of right female breast ([name]) 06/2017   right breast  . Migraine   . Palpitations   . Pneumonia    with cavitation of left lower lobe  . Tuberculosis    exposure as a child, get checked frequently  . Uterine fibroid    Past Surgical History:  Procedure Laterality Date  . ABDOMINAL HYSTERECTOMY    . BREAST LUMPECTOMY WITH RADIOACTIVE SEED AND  SENTINEL LYMPH NODE BIOPSY Right 07/24/2017   Procedure: RIGHT BREAST LUMPECTOMY WITH RADIOACTIVE SEED AND SENTINEL LYMPH NODE BIOPSY;  Surgeon: Erroll Luna, MD;  Location: [facility];  Service: General;  Laterality: Right;  . PORTACATH PLACEMENT Right 07/24/2017   Procedure: INSERTION PORT-A-CATH;  Surgeon: Erroll Luna, MD;  Location: [facility];  Service: General;  Laterality: Right;  . TUBAL LIGATION  1993  . VAGINAL HYSTERECTOMY Bilateral 12/19/2016   Procedure: HYSTERECTOMY VAGINAL;  Surgeon: Lavonia Drafts, MD;  Location: [facility];  Service: Gynecology;  Laterality: Bilateral;  . WISDOM TOOTH EXTRACTION       ALLERGIES:  Allergies  Allergen Reactions  . Morphine Anaphylaxis, Hives and Swelling  . Shellfish Allergy Anaphylaxis  . Penicillins Other (See Comments)    Yeast infection  Has patient had a PCN reaction causing immediate rash, facial/tongue/throat swelling, SOB or lightheadedness with hypotension: no Has patient had a PCN reaction causing severe rash involving mucus membranes or skin necrosis: no Has patient had a PCN reaction that required hospitalization no Has patient had a PCN reaction occurring within the last 10 years:no If all of the above answers are "NO", then may proceed with Cephalosporin use.   . Codeine Hives  . Hydrocodone Hives    Pt can take percocet   . Tomato Rash     CURRENT MEDICATIONS:  Outpatient Encounter Medications as of 12/09/2018  Medication Sig  . albuterol (PROVENTIL HFA;VENTOLIN HFA) 108 (90 Base) MCG/ACT inhaler Inhale 2 puffs into the lungs every 6 (six) hours as needed for wheezing or shortness of breath.  Marland Kitchen ibuprofen (ADVIL,MOTRIN) 600 MG tablet Take 1 tablet (600 mg total) by mouth every 6 (six) hours as needed.  Marland Kitchen ipratropium (ATROVENT) 0.03 % nasal spray Place 2 sprays into both nostrils 2 (two) times daily.   . metoprolol succinate (TOPROL XL) 25 MG 24 hr tablet Take 1 tablet (25 mg  total) by mouth daily.  . prochlorperazine (COMPAZINE) 10 MG tablet Take 1 tablet (10 mg total) by mouth every 6 (six) hours as needed (Nausea or vomiting).  . [DISCONTINUED] DULoxetine (CYMBALTA) 20 MG capsule Take 1 capsule (20 mg total) by mouth daily.  . DULoxetine (CYMBALTA) 30 MG capsule Take 1 capsule (30 mg total) by mouth daily. X 1 week then take 2 tab daily  . oxyCODONE-acetaminophen (PERCOCET/ROXICET) 5-325 MG tablet Take 1 tablet by mouth every 8 (eight) hours as needed for severe pain. (Patient not taking: Reported on 12/09/2018)  . [DISCONTINUED] diltiazem (CARDIZEM SR) 120 MG 12 hr capsule Take 1 capsule (120 mg total) by mouth daily.   No facility-administered encounter medications on file as of 12/09/2018.      ONCOLOGIC FAMILY HISTORY:  Family History  Problem Relation Age of Onset  . Migraines Daughter   . Hypertension Mother   . Heart disease Mother   . Hypertension Father   . Heart disease Father   . Breast cancer Sister 38       deceased at age 16  .  Migraines Son   . Hypertension Unknown   . Colon cancer Neg Hx   . Colon polyps Neg Hx   . Diabetes Neg Hx   . Kidney disease Neg Hx   . Liver disease Neg Hx   . Heart attack Neg Hx   . Stroke Neg Hx      GENETIC COUNSELING/TESTING: Not at this time  SOCIAL HISTORY:  Social History   Socioeconomic History  . Marital status: Married    Spouse name: Not on file  . Number of children: 2  . Years of education: 41  . Highest education level: Not on file  Occupational History  . Occupation: unemployed  Social Needs  . Financial resource strain: Not on file  . Food insecurity:    Worry: Not on file    Inability: Not on file  . Transportation needs:    Medical: Not on file    Non-medical: Not on file  Tobacco Use  . Smoking status: Former Smoker    Packs/day: 0.50    Types: Cigarettes    Last attempt to quit: 07/16/2017    Years since quitting: 1.4  . Smokeless tobacco: Never Used  . Tobacco  comment: smokes 1cigarette per day and more if she is stressed  Substance and Sexual Activity  . Alcohol use: No    Alcohol/week: 0.0 standard drinks  . Drug use: Yes    Types: Marijuana    Comment: occasional, for pain. states she hasnt used for a Katherine; 08/02/17 sometimes  . Sexual activity: Yes    Birth control/protection: Surgical  Lifestyle  . Physical activity:    Days per week: Not on file    Minutes per session: Not on file  . Stress: Not on file  Relationships  . Social connections:    Talks on phone: Not on file    Gets together: Not on file    Attends religious service: Not on file    Active member of club or organization: Not on file    Attends meetings of clubs or organizations: Not on file    Relationship status: Not on file  . Intimate partner violence:    Fear of current or ex partner: Not on file    Emotionally abused: Not on file    Physically abused: Not on file    Forced sexual activity: Not on file  Other Topics Concern  . Not on file  Social History Narrative   On disability for heart condition.  Pt. Was non-specific.       Patient does not drink caffeine.   Patient is ambidextrous, but mostly right handed.    12th grade, some college courses     PHYSICAL EXAMINATION:  Vital Signs:   Vitals:   12/09/18 1251  BP: 134/88  Pulse: (!) 59  Resp: 18  Temp: 98 F (36.7 C)  SpO2: 100%   Filed Weights   12/09/18 1251  Weight: 161 lb (73 kg)   General: Well-nourished, well-appearing female in no acute distress.  She is accompanied in clinic by her husband today.   HEENT: Head is normocephalic.  Pupils equal and reactive to light. Conjunctivae clear without exudate.  Sclerae anicteric. Oral mucosa is pink, moist.  Oropharynx is pink without lesions or erythema.  Lymph: No cervical, supraclavicular, or infraclavicular lymphadenopathy noted on palpation.  Cardiovascular: Regular rate and rhythm.Marland Kitchen Respiratory: Clear to auscultation bilaterally. Chest  expansion symmetric; breathing non-labored.  GI: Abdomen soft and round; non-tender, non-distended. Bowel sounds normoactive.  GU: Deferred.  Neuro: No focal deficits. Steady gait.  Psych: normal. Extremities: No edema. MSK: No focal spinal tenderness to palpation.  Full range of motion in bilateral upper extremities Skin: Warm and dry.  LABORATORY DATA:  None for this visit.  DIAGNOSTIC IMAGING:  None for this visit.      ASSESSMENT AND PLAN:  Ms.. [name] is a pleasant 48 y.o. female with Stage IA right breast invasive ductal carcinoma, ER-/PR-/HER2+, diagnosed in 06/26/2017, treated with lumpectomy, adjuvant chemotherapy, and adjuvant radiation therapy.  She presents to the Survivorship Clinic for our initial meeting and routine follow-up post-completion of treatment for breast cancer.    1. Stage IA right breast cancer:  Katherine [name] is continuing to recover from definitive treatment for breast cancer. She will follow-up with her medical oncologist, Dr. Lindi Adie in 2 weeks and then in one year.   Today, a comprehensive survivorship care plan and treatment summary was reviewed with the patient today detailing her breast cancer diagnosis, treatment course, potential late/long-term effects of treatment, appropriate follow-up care with recommendations for the future, and patient education resources.  A copy of this summary, along with a letter will be sent to the patient's primary care provider via mail/fax/In Basket message after today's visit.    2. Right arm and leg weakness: MRI brain and neuro referral.  I reordered temporary handicapped placard for her.  3. Bone health:  Given Katherine [name] age/history of breast cancer, she is at slight risk for bone demineralization.  She was given education on specific activities to promote bone health.  4. Urinary frequency: Urinalysis and urine culture  5. Peripheral neuropathy: I increased her cymbalta to 6m daily for one week, then she will increase to 60  mg daily.    6. Back pain: Dr. GLindi Adierecommended evaluation with PCP about her back pain, since all of her scans have been negative for metastatic disease.  We did not write for any percocet today.    7. Right upper arm pain, slight swelling: Doppler ordered to rule out DVT.  She still has her port and I sent message to Dr. CBrantley Stageto request scheduling of port removal.    8. Cancer screening:  Due to Katherine [name]'s history and her age, she should receive screening for skin cancers, colon cancer, and gynecologic cancers.  The information and recommendations are listed on the patient's comprehensive care plan/treatment summary and were reviewed in detail with the patient.    9. Health maintenance and wellness promotion: Katherine [name] encouraged to consume 5-7 servings of fruits and vegetables per day. We reviewed the "Nutrition Rainbow" handout, as well as the handout "Take Control of Your Health and Reduce Your Cancer Risk" from the [employer]  She was also encouraged to engage in moderate to vigorous exercise for 30 minutes per day most days of the week. We discussed the [employer] fitness program, which is designed for cancer survivors to help them become more physically fit after cancer treatments.  She was instructed to limit her alcohol consumption and continue to abstain from tobacco use.     10. Support services/counseling: It is not uncommon for this period of the patient's cancer care trajectory to be one of many emotions and stressors.  We discussed an opportunity for her to participate in the next session of [facility]Finding [facility] support group series designed for patients after they have completed treatment.   Ms. LMcgintywas encouraged to take advantage of our many  other support services programs, support groups, and/or counseling in coping with her new life as a cancer survivor after completing anti-cancer treatment.   She was given information regarding our available  services and encouraged to contact me with any questions or for help enrolling in any of our support group/programs.    Dispo:   -Return to cancer center after brain MRI for f/u with Dr. Lindi Adie -Mammogram due now, placed orders today -She is welcome to return back to the Survivorship Clinic at any time; no additional follow-up needed at this time.  -Consider referral back to survivorship as a long-term survivor for continued surveillance  A total of (30) minutes of face-to-face time was spent with this patient with greater than 50% of that time in counseling and care-coordination.   Gardenia Phlegm, NP Survivorship Program [facility] [phone]   Note: PRIMARY CARE PROVIDER Javier Docker, [place] [phone]  Attending Note  I personally saw and examined Katherine [name]. The plan of care was discussed with her. I agree with the assessment and plan as documented above. Neuropathy: Cymbalta dose being increased Right-sided weakness: MRI brain to be ordered and neurology consult will likely to be requested. Chronic back pain: I defer that to her primary care physician.  I refused to give narcotics today.  Signed Harriette Ohara, MD

## 2018-12-10 ENCOUNTER — Telehealth: Payer: Self-pay

## 2018-12-10 LAB — URINE CULTURE: Culture: NO GROWTH

## 2018-12-10 NOTE — Telephone Encounter (Signed)
LVM for patient to call back. ?

## 2018-12-10 NOTE — Telephone Encounter (Signed)
-----   Message from Gardenia Phlegm, NP sent at 12/09/2018  3:14 PM EDT ----- Urine negative for infection ----- Message ----- From: Buel Ream, [facility] [place] Sent: 12/09/2018   3:08 PM EDT To: Gardenia Phlegm, NP

## 2018-12-16 ENCOUNTER — Ambulatory Visit (HOSPITAL_COMMUNITY)
Admission: RE | Admit: 2018-12-16 | Discharge: 2018-12-16 | Disposition: A | Discharge status: Home / Self Care | Payer: Medicaid Other | Source: Ambulatory Visit | Attending: Adult Health | Admitting: Adult Health

## 2018-12-16 NOTE — Progress Notes (Signed)
Patient was scheduled for Brain MRi 12/14/2018, pt was a no call no show,   We called patient to see if she still wanted to come today and she hung up on [place]. RLP

## 2018-12-24 ENCOUNTER — Other Ambulatory Visit: Payer: Self-pay

## 2018-12-24 ENCOUNTER — Ambulatory Visit
Admission: RE | Admit: 2018-12-24 | Discharge: 2018-12-24 | Disposition: A | Discharge status: Home / Self Care | Payer: Medicaid Other | Source: Ambulatory Visit | Attending: Adult Health | Admitting: Adult Health

## 2018-12-24 DIAGNOSIS — C50411 Malignant neoplasm of upper-outer quadrant of right female breast: Secondary | ICD-10-CM

## 2018-12-24 DIAGNOSIS — Z171 Estrogen receptor negative status [ER-]: Principal | ICD-10-CM

## 2018-12-25 ENCOUNTER — Encounter: Payer: Self-pay | Admitting: General Practice

## 2018-12-25 NOTE — Progress Notes (Signed)
[place] Team contacted patient to assess for food insecurity and other psychosocial needs during current COVID19 pandemic.  Unable to reach patient, left generic VM asking her to return call.    Beverely Pace, [place]

## 2019-02-02 ENCOUNTER — Other Ambulatory Visit: Payer: Self-pay | Admitting: Adult Health

## 2019-02-02 DIAGNOSIS — Z171 Estrogen receptor negative status [ER-]: Secondary | ICD-10-CM

## 2019-02-02 DIAGNOSIS — C50411 Malignant neoplasm of upper-outer quadrant of right female breast: Secondary | ICD-10-CM

## 2019-02-07 IMAGING — US US PLC BREAST LOC DEV 1ST LESION  INC US GUIDE*R*
1 series · 2 of 2 positions shown · non-contrast
Comparison: Previous exam(s).

CLINICAL DATA: Preoperative radioactive seed localization for right
breast 830 o'clock 8 cm from the nipple mass.

EXAM:
ULTRASOUND GUIDED RADIOACTIVE SEED LOCALIZATION OF THE RIGHT BREAST

[Series 1: us plc breast loc dev 1st lesion inc us guide*righ · 0.05mm/px · 2 of 2 slices shown]
[im 1/2]
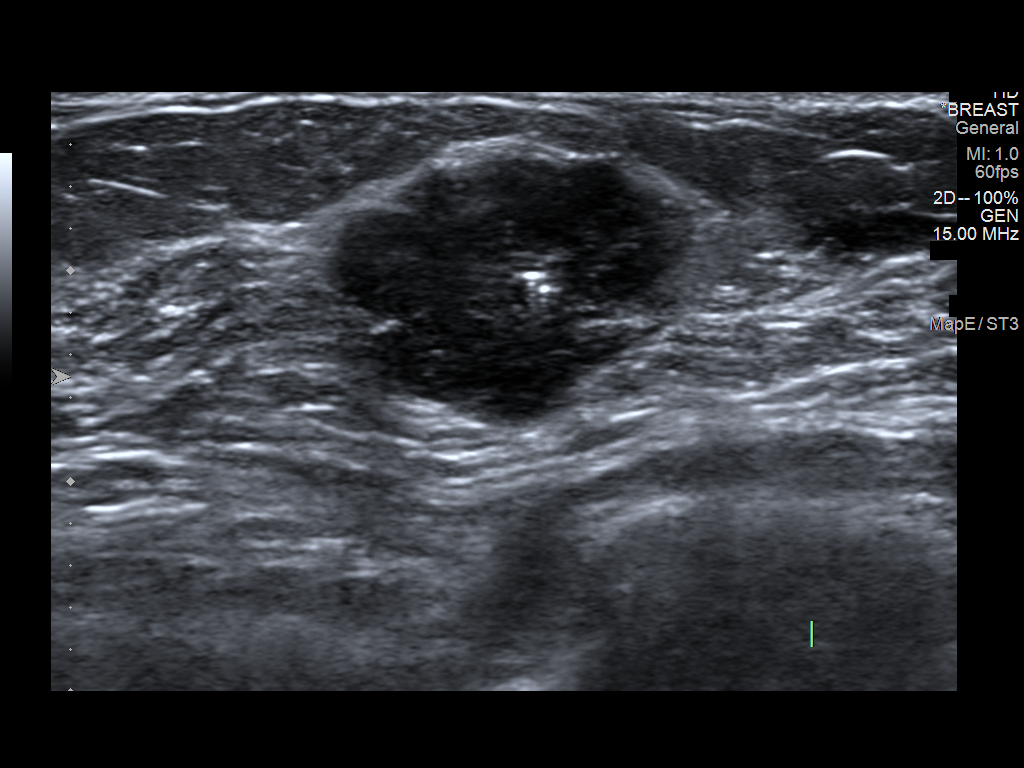
[im 2/2]
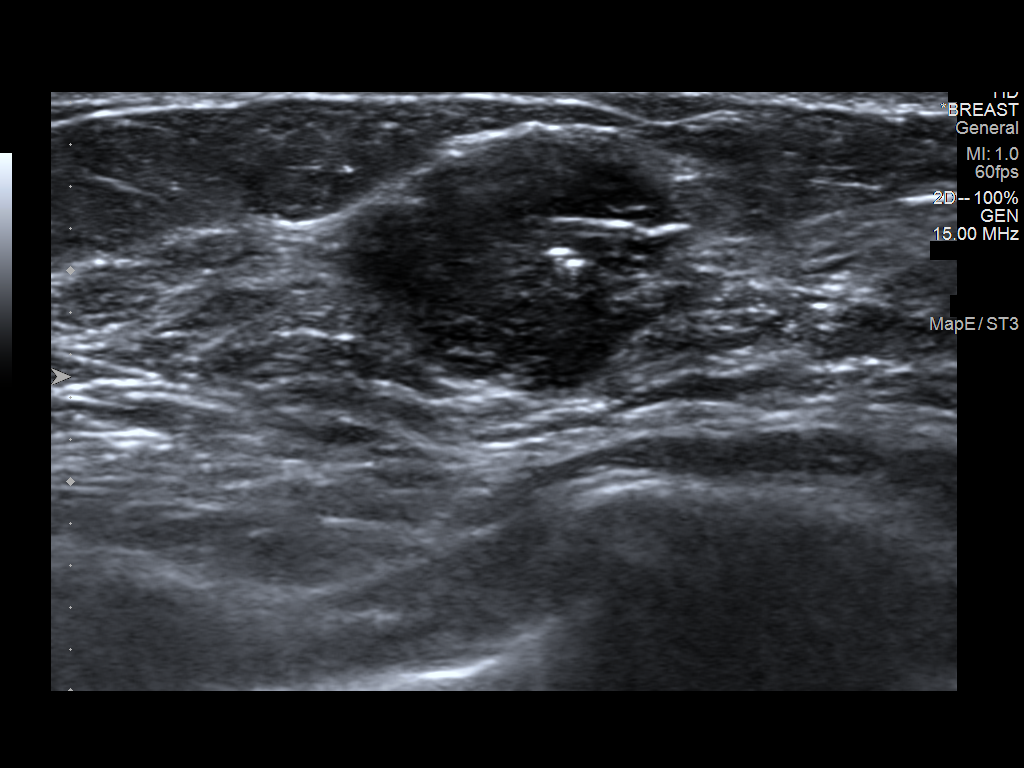

[2 of 2 positions shown; findings below may reference images not displayed]

FINDINGS: Patient presents for radioactive seed localization prior to right
breast lumpectomy. I met with the patient and we discussed the
procedure of seed localization including benefits and alternatives.
We discussed the high likelihood of a successful procedure. We
discussed the risks of the procedure including infection, bleeding,
tissue injury and further surgery. We discussed the low dose of
radioactivity involved in the procedure. Informed, written consent
was given.

The usual time-out protocol was performed immediately prior to the
procedure.

Using ultrasound guidance, sterile technique, 1% lidocaine and an
T-T0W radioactive seed, right breast 830 o'clock 8 cm from the
nipple mass was localized using a inferior approach. The follow-up
mammogram images confirm the seed in the expected location and were
marked for Dr. Ranger.

Follow-up survey of the patient confirms presence of the radioactive
seed.

Order number of T-T0W seed:  [id number].

Total activity: 0.249 millicurie Reference Date: July 06, 2017

The patient tolerated the procedure well and was released from the
[REDACTED]. She was given instructions regarding seed removal.
IMPRESSION: Radioactive seed localization right breast. No apparent
complications.

## 2019-03-14 ENCOUNTER — Telehealth: Payer: Self-pay

## 2019-03-14 ENCOUNTER — Other Ambulatory Visit: Payer: Self-pay | Admitting: Adult Health

## 2019-03-14 DIAGNOSIS — Z1231 Encounter for screening mammogram for malignant neoplasm of breast: Secondary | ICD-10-CM

## 2019-03-14 NOTE — Progress Notes (Signed)
MRI breast ordered per mammogram from 11/2018 that shows breast density category D.    Wilber Bihari, NP

## 2019-03-14 NOTE — Telephone Encounter (Signed)
Spoke with patient to inform that NP has added beast MRI to mammogram due to patient having dense breasts and that MRI helps to find cancer if any due to dense breasts.  Patient voiced understanding and had no other concerns.

## 2019-03-19 ENCOUNTER — Ambulatory Visit (HOSPITAL_COMMUNITY): Payer: Medicaid Other

## 2019-03-21 ENCOUNTER — Ambulatory Visit (HOSPITAL_COMMUNITY): Admission: RE | Admit: 2019-03-21 | Payer: Medicaid Other | Source: Ambulatory Visit

## 2019-04-08 IMAGING — CT CT ABD-PELV W/ CM
2 of 5 series · 16 of 46 positions shown, 18 images · IV contrast (isovue)
Comparison: Abdominal ultrasound 01/21/2017. CT of the abdomen and
pelvis 03/13/2012.

CLINICAL DATA: Left flank pain.  Lower abdomen pain.

EXAM:
CT ABDOMEN AND PELVIS WITH CONTRAST
TECHNIQUE: Multidetector CT imaging of the abdomen and pelvis was performed
using the standard protocol following bolus administration of
intravenous contrast.
CONTRAST:  100 mL Isovue 300

[Series 2: axial st · axial · 0.68mm/px · z∈[-478,-88]mm · 13 of 91 slices shown, 15 images]
[im 7/91  soft-tissue]
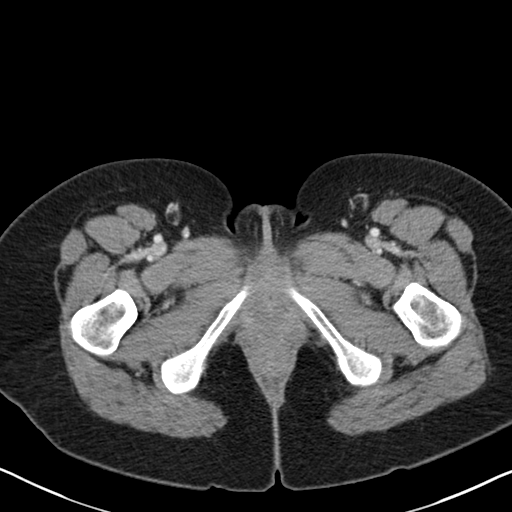
[im 7/91  bone]
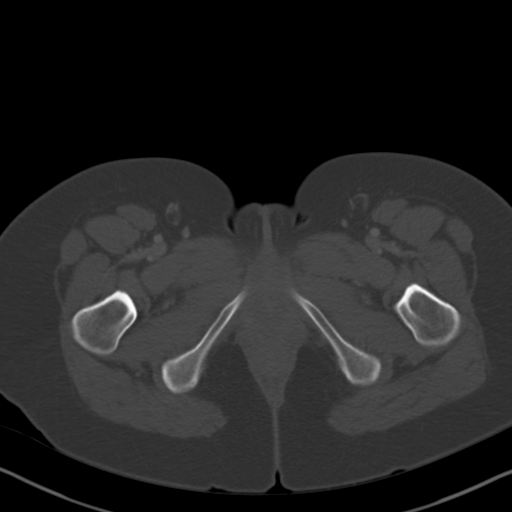
[im 13/91  soft-tissue]
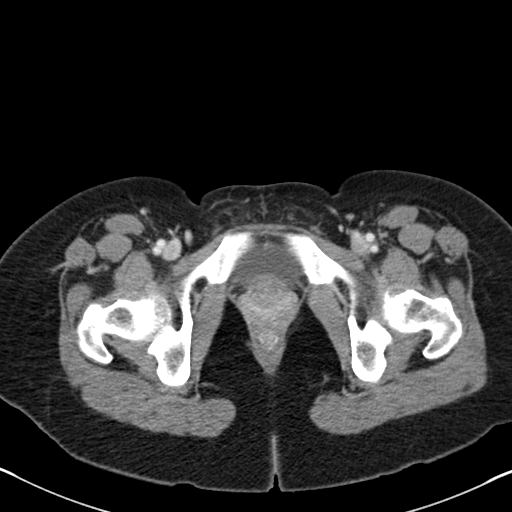
[im 19/91  soft-tissue]
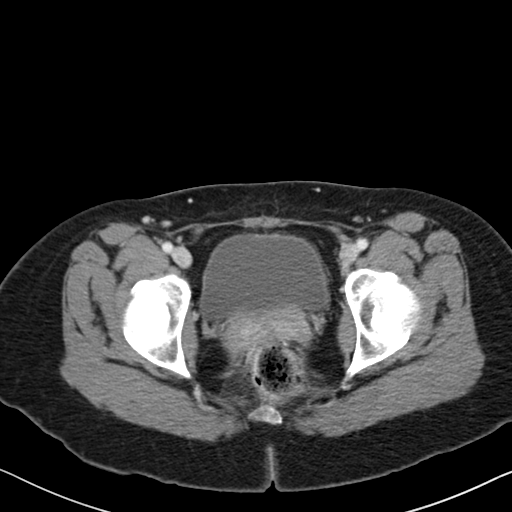
[im 25/91  soft-tissue]
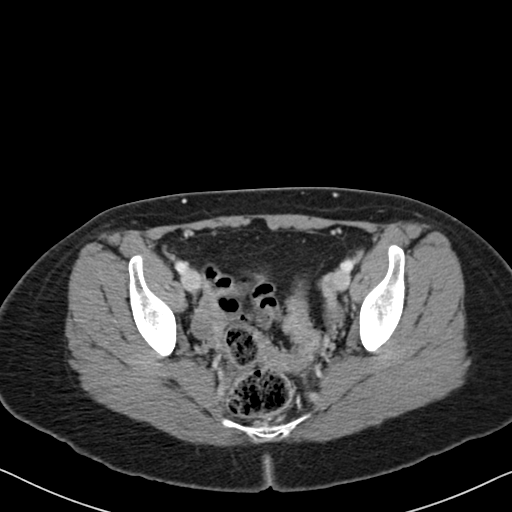
[im 31/91  soft-tissue]
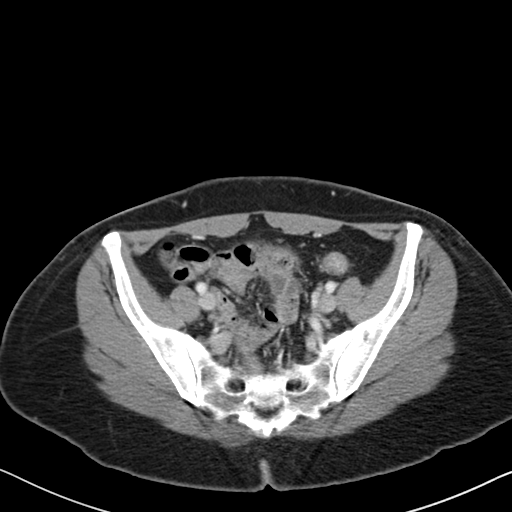
[im 37/91  soft-tissue]
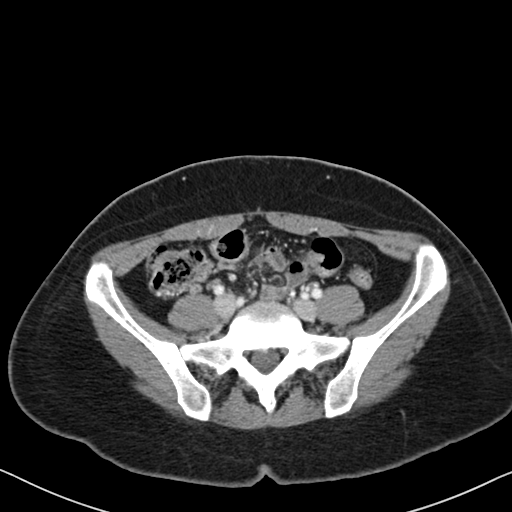
[im 49/91  soft-tissue]
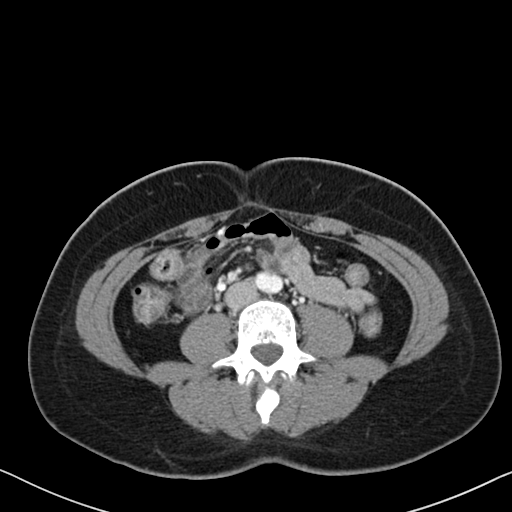
[im 55/91  soft-tissue]
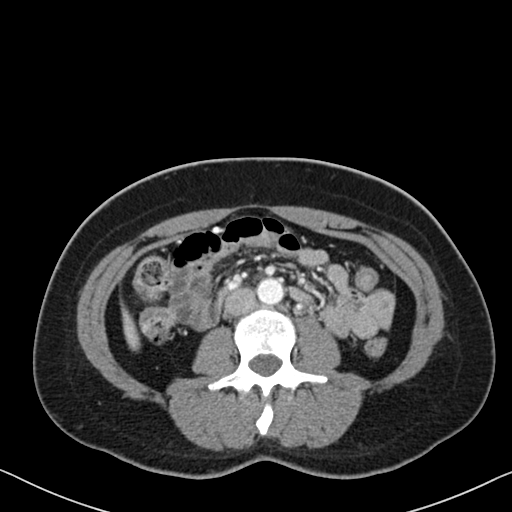
[im 61/91  soft-tissue]
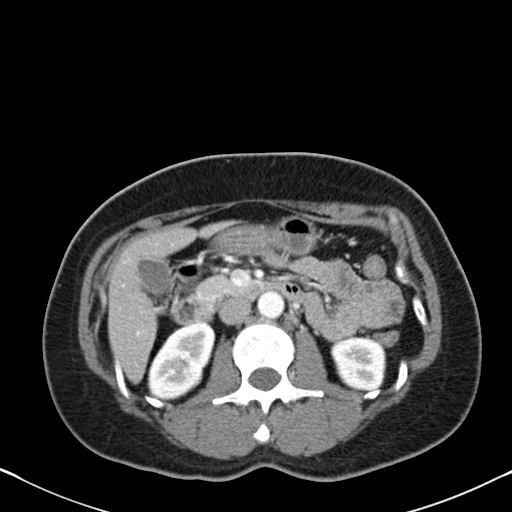
[im 61/91  bone]
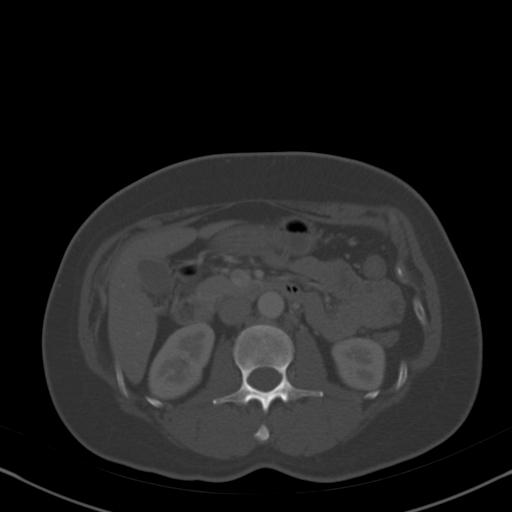
[im 67/91  soft-tissue]
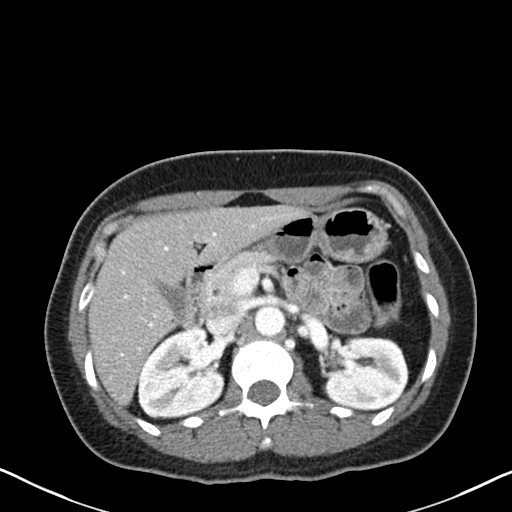
[im 73/91  soft-tissue]
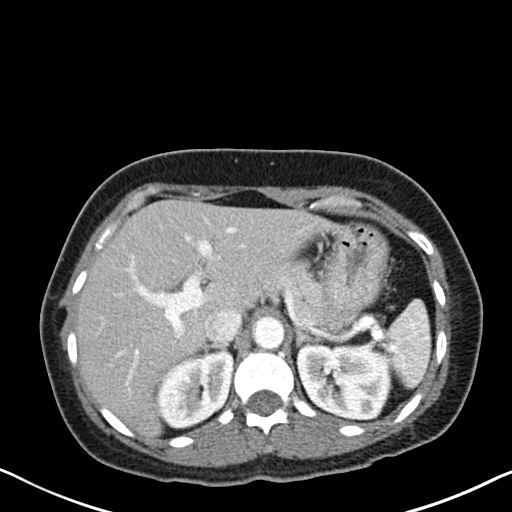
[im 79/91  soft-tissue]
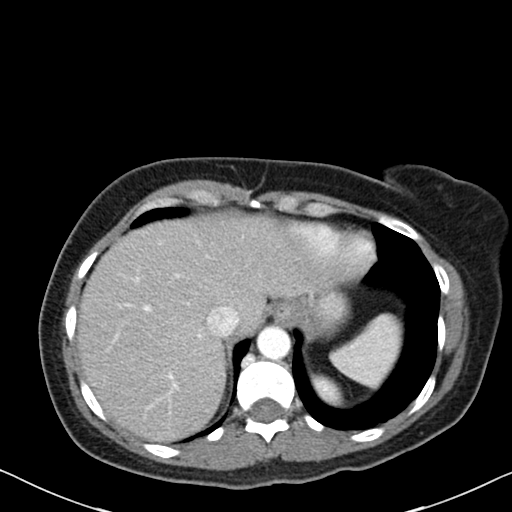
[im 85/91  soft-tissue]
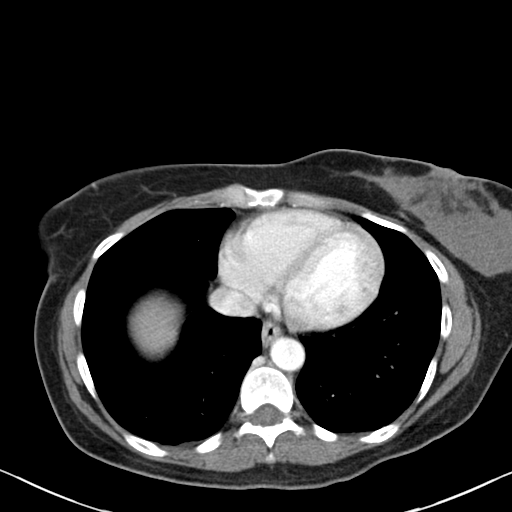

[Series 5: coronal st · coronal · 0.67mm/px · 3 of 83 slices shown]
[im 28/83  soft-tissue]
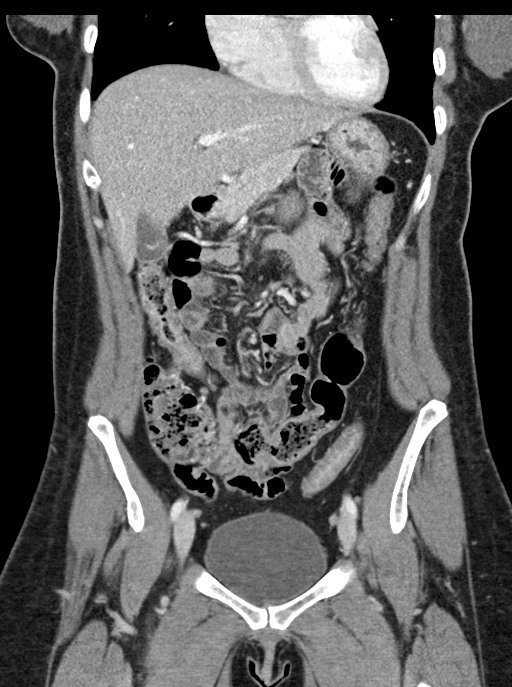
[im 37/83  soft-tissue]
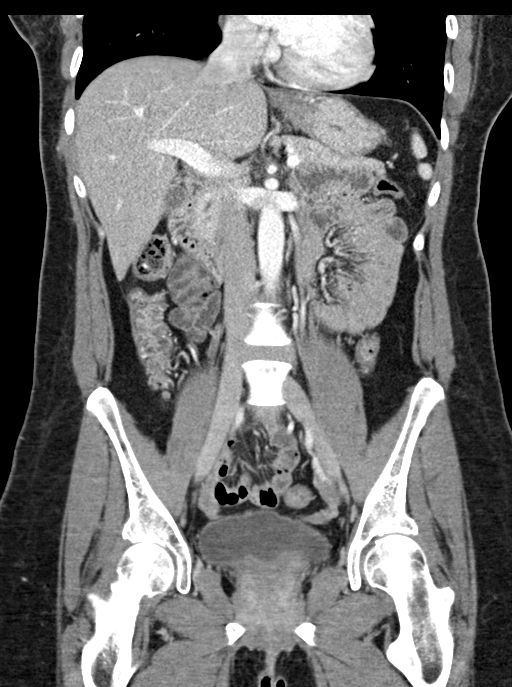
[im 46/83  soft-tissue]
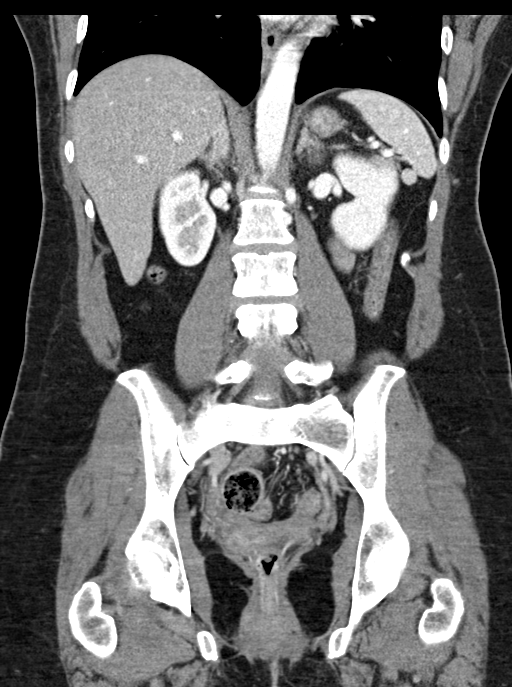

[16 of 46 positions shown; findings below may reference images not displayed]

FINDINGS: Lower chest: Lung bases are clear without focal nodule, mass, or
airspace disease. Heart size is normal. No significant pleural or
pericardial effusion is present.

Hepatobiliary: No focal liver abnormality is seen. No gallstones,
gallbladder wall thickening, or biliary dilatation.

Pancreas: Unremarkable. No pancreatic ductal dilatation or
surrounding inflammatory changes.

Spleen: Normal in size without focal abnormality.

Adrenals/Urinary Tract: The adrenal glands are normal bilaterally.
Kidneys and ureters are within normal limits. The urinary bladder is
unremarkable.

Stomach/Bowel: The stomach and duodenum are within normal limits.
Small bowel is unremarkable. The terminal ileum is within normal
limits. Appendix is visualized and normal. Ascending and transverse
colon are normal. Descending and sigmoid colon are unremarkable.

Vascular/Lymphatic: No significant vascular findings are present. No
enlarged abdominal or pelvic lymph nodes.

Reproductive: The patient has had a hysterectomy. Adnexa are within
normal limits.

Other: No abdominal wall hernia or abnormality. No abdominopelvic
ascites.

Musculoskeletal: Vertebral body heights alignment are normal. Pelvis
is unremarkable. The hips are located and within normal limits
bilaterally. No focal lytic or blastic lesions are present.
IMPRESSION: 1. No acute or focal lesion to explain the patient's left flank or
lower abdominal pain.
2. Negative CT of the abdomen and pelvis.
3. Hysterectomy.

## 2019-04-24 ENCOUNTER — Telehealth: Payer: Self-pay

## 2019-04-24 NOTE — Telephone Encounter (Signed)
RN returned call, voicemail left for return call.  

## 2019-05-21 ENCOUNTER — Ambulatory Visit: Payer: Medicaid Other | Admitting: Physician Assistant

## 2019-05-21 ENCOUNTER — Other Ambulatory Visit: Payer: Self-pay

## 2019-05-21 ENCOUNTER — Encounter: Payer: Self-pay | Admitting: Physician Assistant

## 2019-05-21 VITALS — BP 116/76 | HR 59 | Ht 65.0 in | Wt 160.8 lb

## 2019-05-21 DIAGNOSIS — I471 Supraventricular tachycardia, unspecified: Secondary | ICD-10-CM

## 2019-05-21 DIAGNOSIS — C50411 Malignant neoplasm of upper-outer quadrant of right female breast: Secondary | ICD-10-CM

## 2019-05-21 DIAGNOSIS — K219 Gastro-esophageal reflux disease without esophagitis: Secondary | ICD-10-CM | POA: Diagnosis not present

## 2019-05-21 DIAGNOSIS — Z171 Estrogen receptor negative status [ER-]: Secondary | ICD-10-CM | POA: Diagnosis not present

## 2019-05-21 MED ORDER — PROPRANOLOL HCL 10 MG PO TABS: 10.0000 mg | ORAL_TABLET | Freq: Every day | ORAL | 2 refills | Status: DC | PRN

## 2019-05-21 NOTE — Patient Instructions (Addendum)
Medication Instructions:  Your physician has recommended you make the following change in your medication:  1. START PROPRANOLOL 10 MG DAILY AS NEEDED  If you need a refill on your cardiac medications before your next appointment, please call your pharmacy.   Lab work: TODAY: BMET, TSH If you have labs (blood work) drawn today and your tests are completely normal, you will receive your results only by: Marland Kitchen MyChart Message (if you have MyChart) OR . A paper copy in the mail If you have any lab test that is abnormal or we need to change your treatment, we will call you to review the results.  Testing/Procedures: NONE  Follow-Up: At [facility], you and your health needs are our priority.  As part of our continuing mission to provide you with exceptional heart care, we have created designated Provider Care Teams.  These Care Teams include your primary Cardiologist (physician) and Advanced Practice Providers (APPs -  Physician Assistants and Nurse Practitioners) who all work together to provide you with the care you need, when you need it. You will need a follow up appointment in:  6 months.  Please call our office 2 months in advance to schedule this appointment.  You may see Dorris Carnes, MD or one of the following Advanced Practice Providers on your designated Care Team: Richardson Dopp, PA-C [place], [place] . Daune Perch, NP  You have been referred to SEE A ELECTROPHYSIOLOGIST; DR. Lovena Le.   Any Other Special Instructions Will Be Listed Below (If Applicable).

## 2019-05-21 NOTE — Progress Notes (Signed)
Cardiology Office Note:    Date:  05/21/2019   ID:  [name], DOB 22-May-1971, MRN [id number]  PCP:  Javier Docker, MD  Cardiologist:  Dorris Carnes, MD   Electrophysiologist:  None  [facility]:  Loralie Champagne, MD  - released in 08/2018  Referring MD: Javier Docker, MD   Chief Complaint  Patient presents with  . Palpitations    History of Present Illness:    Katherine [name] is a 48 y.o. female with:  SVT  Palpitations 9/19: Zio w/o sig arrhythmia, Tx with Toprol XL  Chest pain   Breast CA (ER-/PR-/HER2+)  S/p R lumpectomy  Taxol, Herceptin Rx; Herceptin completed 12/19  Followed by Dr. Aundra Dubin w/ [facility] >> serial echo's with nl EF and nl GLS  Last AHF clinic visit 12/19 >> FU prn   [name] returns for evaluation of palpitations.  She had an episode of rapid palpitations several days ago that lasted about 20 minutes.  She could not count her pulse rate.  She felt lightheaded with this.  She also had associated chest discomfort.  She does not feel that the metoprolol succinate is helping.  She has not had exertional chest pain or shortness of breath.  She has not had syncope.  She has not had orthopnea.  She has mild leg swelling at times.  She does not smoke, drink alcohol or use drugs.  Prior CV studies:   The following studies were reviewed today:  Echocardiogram 09/05/2018 EF 55-60, normal wall motion, GLS -31%, grade 2 diastolic dysfunction, mild MR, LA upper limits of normal, normal RV SF  Long-term cardiac monitor (3-14 days) 05/2018 Predominantly NSR with rare PVCs and PACs.  Stress echocardiogram 12/16/2015 Study Conclusions - Stress ECG conclusions: The stress ECG was normal. - Staged echo: There was no echocardiographic evidence for   stress-induced ischemia.  Impressions: - Stress echocardiogram with no chest pain, no ST changes and no   stress-induced wall motion abnormalities; normal stress   echocardiogram.  Past  Medical History:  Diagnosis Date  . Anxiety   . Asthma   . Dysrhythmia    History of SVT-No medications  . Fibromyalgia   . GERD (gastroesophageal reflux disease)   . History of radiation therapy 12/19/17- 01/15/18   Right Breast, 2.67 Gy in 15 fractions for a total dose of 40.05 Gy. Boost, 2 Gy in 5 fractions for a total dose of 10 Gy  . Hot flashes   . Malignant neoplasm of upper-outer quadrant of right female breast ([place]) 06/2017   right breast  . Migraine   . Palpitations   . Pneumonia    with cavitation of left lower lobe  . Tuberculosis    exposure as a child, get checked frequently  . Uterine fibroid    Surgical Hx: The patient  has a past surgical history that includes Tubal ligation (1993); Wisdom tooth extraction; Vaginal hysterectomy (Bilateral, 12/19/2016); Abdominal hysterectomy; Breast lumpectomy with radioactive seed and sentinel lymph node biopsy (Right, 07/24/2017); Portacath placement (Right, 07/24/2017); and Breast lumpectomy (Right).   Current Medications: Current Meds  Medication Sig  . albuterol (PROVENTIL HFA;VENTOLIN HFA) 108 (90 Base) MCG/ACT inhaler Inhale 2 puffs into the lungs every 6 (six) hours as needed for wheezing or shortness of breath.  . DULoxetine (CYMBALTA) 30 MG capsule Take 30 mg by mouth daily.  Marland Kitchen esomeprazole (NEXIUM) 40 MG capsule TAKE 1 CAPSULE BY MOUTH BEFORE FIRST MEAL OF THE DAY  .  ibuprofen (ADVIL,MOTRIN) 600 MG tablet Take 1 tablet (600 mg total) by mouth every 6 (six) hours as needed.  . meloxicam (MOBIC) 7.5 MG tablet TAKE ONE TABLET BY MOUTH DAILY FOR PAIN, TAKE WITH FOOD  . metoprolol succinate (TOPROL-XL) 25 MG 24 hr tablet Take 25 mg by mouth daily.  Marland Kitchen oxyCODONE-acetaminophen (PERCOCET/ROXICET) 5-325 MG tablet Take 1 tablet by mouth every 8 (eight) hours as needed for severe pain.  Marland Kitchen propranolol (INDERAL) 10 MG tablet Take 1 tablet (10 mg total) by mouth daily as needed.     Allergies:   Shellfish allergy, Penicillins, and  Tomato   Social History   Tobacco Use  . Smoking status: Former Smoker    Packs/day: 0.50    Types: Cigarettes    Quit date: 07/16/2017    Years since quitting: 1.8  . Smokeless tobacco: Never Used  . Tobacco comment: smokes 1cigarette per day and more if she is stressed  Substance Use Topics  . Alcohol use: No    Alcohol/week: 0.0 standard drinks  . Drug use: Yes    Types: Marijuana    Comment: occasional, for pain. states she hasnt used for a mont; 08/02/17 sometimes     Family Hx: The patient's family history includes Heart disease in her father and mother; Hypertension in her father, mother, and another family member; Migraines in her daughter and son. There is no history of Colon cancer, Colon polyps, Diabetes, Kidney disease, Liver disease, Heart attack, or Stroke.  ROS:   Please see the history of present illness.    No fevers, cough, vomiting, diarrhea, melena or hematochezia. ROS All other systems reviewed and are negative.   EKGs/Labs/Other Test Reviewed:    EKG:  EKG is not ordered today.  The ekg ordered today demonstrates n/a  Recent Labs: 08/26/2018: ALT 13; BUN 11; Creatinine 0.75; Hemoglobin 12.6; Platelet Count 207; Potassium 3.5; Sodium 144   Recent Lipid Panel Lab Results  Component Value Date/Time   CHOL 145 09/05/2013 02:59 PM   TRIG 76 09/05/2013 02:59 PM   HDL 46 09/05/2013 02:59 PM   CHOLHDL 3.2 09/05/2013 02:59 PM   LDLCALC 84 09/05/2013 02:59 PM    Physical Exam:    VS:  BP 116/76   Pulse (!) 59   Ht '5\' 5"'  (1.651 m)   Wt 160 lb 12.8 oz (72.9 kg)   LMP 11/13/2016   SpO2 98%   BMI 26.76 kg/m     Wt Readings from Last 3 Encounters:  05/21/19 160 lb 12.8 oz (72.9 kg)  12/09/18 161 lb (73 kg)  09/05/18 164 lb 3.2 oz (74.5 kg)     Physical Exam  Constitutional: She is oriented to person, place, and time. She appears well-developed and well-nourished. No distress.  HENT:  Head: Normocephalic and atraumatic.  Eyes: No scleral icterus.   Neck: No JVD present. No thyromegaly present.  Cardiovascular: Normal rate, regular rhythm and normal heart sounds.  No murmur heard. Pulmonary/Chest: Effort normal and breath sounds normal. She has no rales.  Abdominal: Soft. There is no hepatomegaly.  Musculoskeletal:        General: No edema.  Lymphadenopathy:    She has no cervical adenopathy.  Neurological: She is alert and oriented to person, place, and time.  Skin: Skin is warm and dry.  Psychiatric: She has a normal mood and affect.    ASSESSMENT & PLAN:    1. SVT (supraventricular tachycardia) ([place]) She feels that her episodes of palpitations are becoming more  frequent.  I reviewed her chart today and she did see Dr. Lovena Le several years ago.  She had documented SVT at that time and he discussed proceeding with ablation at that time.  However, she did not pursue ablation.  She has not really had any improved symptoms with metoprolol succinate.  I will give her a short acting beta-blocker to use as needed.  We also discussed Valsalva maneuvers.  I have recommended that we get her back to see Dr. Lovena Le to further evaluate.  She recently had an event monitor.  Therefore, I do not think repeating this will provide much benefit.  -Obtain BMET, TSH today  -Continue metoprolol succinate 25 mg daily  -Start propranolol 10 mg daily as needed for rapid palpitations  -Referred back to Dr. Lovena Le for EP  2. Malignant neoplasm of upper-outer quadrant of right breast in female, estrogen receptor negative ([place]) Serial echocardiograms demonstrated normal LV function.  She has completed Herceptin therapy.  Continue follow-up with oncology.  3. GERD She remains on proton pump inhibitor therapy.   Dispo:  Return in about 6 months (around 11/21/2019) for Routine Follow Up, w/ Dr. Harrington Challenger.   Medication Adjustments/Labs and Tests Ordered: Current medicines are reviewed at length with the patient today.  Concerns regarding medicines are outlined  above.  Tests Ordered: Orders Placed This Encounter  Procedures  . Basic metabolic panel  . TSH  . Ambulatory referral to Cardiac Electrophysiology   Medication Changes: Meds ordered this encounter  Medications  . propranolol (INDERAL) 10 MG tablet    Sig: Take 1 tablet (10 mg total) by mouth daily as needed.    Dispense:  20 tablet    Refill:  2    Signed, Richardson Dopp, PA-C  05/21/2019 3:08 PM    [facility] [address], [place], [place]  [address] Phone: [phone]; Fax: [phone]

## 2019-05-22 LAB — BASIC METABOLIC PANEL
BUN/Creatinine Ratio: 13 (ref 9–23)
BUN: 7 mg/dL (ref 6–24)
CO2: 22 mmol/L (ref 20–29)
Calcium: 9.2 mg/dL (ref 8.7–10.2)
Chloride: 102 mmol/L (ref 96–106)
Creatinine, Ser: 0.52 mg/dL — ABNORMAL LOW (ref 0.57–1.00)
GFR calc Af Amer: 131 mL/min/{1.73_m2} (ref >59)
GFR calc non Af Amer: 113 mL/min/{1.73_m2} (ref >59)
Glucose: 84 mg/dL (ref 65–99)
Potassium: 4.3 mmol/L (ref 3.5–5.2)
Sodium: 140 mmol/L (ref 134–144)

## 2019-05-22 LAB — TSH: TSH: 1.18 u[IU]/mL (ref 0.450–4.500)

## 2019-05-23 ENCOUNTER — Ambulatory Visit: Payer: Self-pay | Admitting: Surgery

## 2019-05-23 NOTE — H&P (Signed)
Katherine [name] Documented: 05/23/2019 11:55 AM Location: [facility] Patient #: [id number] DOB: 01-22-1971 Married / Language: English / Race: Black or African American Female  History of Present Illness Katherine [name] A. Quintrell Baze MD; 05/23/2019 12:10 PM) Patient words: Malignant neoplasm of upper-outer quadrant of right breast in female, estrogen receptor negative ([place]) 07/24/2017:Right lumpectomy: IDC grade 3, 1.8 cm, 0/2 lymph nodes negative,ER 0%, PR 0%, Ki-67 40%, HER-2 positive ratio 3.59, T1CN0 stage I a         Patient returned for follow-up after right breast lumpectomy with sentinel lymph node mapping and subsequent port placement for chemotherapy in 2018 for stage I breast cancer. She is lost to follow-up and returns to have her port removed. She has not return for any postoperative appointments. She has followed up with oncology. She has no complaints.  The patient is a 48 year old female.   Allergies (Katherine [name], [place]; 05/23/2019 11:56 AM) SHELLFISH Swelling. Allergies Reconciled  Medication History (Katherine [name], RMA; 05/23/2019 11:56 AM) Celecoxib (100MG Capsule, 1 (one) Oral Take 1 capsule by mouth two times daily, Taken starting 07/24/2017) Active. Ibuprofen (800MG Tablet, 1 (one) Oral every eight hours, as needed, Taken starting 07/24/2017) Active. Meloxicam (15MG Tablet, Oral) Active. Pantoprazole Sodium (40MG Tablet DR, Oral) Active. TiZANidine HCl (4MG Tablet, Oral) Active. Proventil HFA (108 (90 Base)MCG/ACT Aerosol Soln, Inhalation) Active. Medications Reconciled    Vitals (Katherine A. Brown RMA; 05/23/2019 11:55 AM) 05/23/2019 11:55 AM Weight: 160 lb Height: 65in Body Surface Area: 1.8 m Body Mass Index: 26.63 kg/m  Temp.: 98.74F  Pulse: 65 (Regular)  BP: 126/78 (Sitting, Left Arm, Standard)        Physical Exam (Katherine [name] A. Quierra Silverio MD; 05/23/2019 12:11 PM)  General Mental Status-Alert. General  Appearance-Consistent with stated age. Hydration-Well hydrated. Voice-Normal.  Breast Note: Right breast incision is healed. Right-sided Port-A-Cath intact. No mass noted    Assessment & Plan (Katherine [name] A. Chadwin Fury MD; 05/23/2019 12:09 PM)  HISTORY OF BREAST CANCER (Z85.3)   PORT-A-CATH IN PLACE (Z99.357) Impression: schedule for port removal  Current Plans I recommended surgery to remove the catheter. I explained the technique of removal with use of local anesthesia & possible need for more aggressive sedation/anesthesia for patient comfort.  Risks such as bleeding, infection, and other risks were discussed. Post-operative dressing/incision care was discussed. I noted a good likelihood this will help address the problem. We will work to minimize complications. Questions were answered. The patient expresses understanding & wishes to proceed with surgery.  Pt Education - [facility] Free Text Education/Instructions: discussed with patient and provided information. You are being scheduled for surgery- Our schedulers will call you.  You should hear from our office's scheduling department within 5 working days about the location, date, and time of surgery. We try to make accommodations for patient's preferences in scheduling surgery, but sometimes the OR schedule or the surgeon's schedule prevents [place] from making those accommodations.  If you have not heard from our office [phone]) in 5 working days, call the office and ask for your surgeon's nurse.  If you have other questions about your diagnosis, plan, or surgery, call the office and ask for your surgeon's nurse.

## 2019-06-11 NOTE — Progress Notes (Signed)
Chart reviewed with Dr. Valma Cava, may proceed at [facility].

## 2019-06-12 ENCOUNTER — Encounter (HOSPITAL_BASED_OUTPATIENT_CLINIC_OR_DEPARTMENT_OTHER): Payer: Self-pay

## 2019-06-12 ENCOUNTER — Encounter (HOSPITAL_BASED_OUTPATIENT_CLINIC_OR_DEPARTMENT_OTHER)
Admission: RE | Admit: 2019-06-12 | Discharge: 2019-06-12 | Disposition: A | Discharge status: Home / Self Care | Payer: Medicaid Other | Source: Ambulatory Visit | Attending: General Surgery | Admitting: General Surgery

## 2019-06-12 ENCOUNTER — Other Ambulatory Visit: Payer: Self-pay

## 2019-06-12 DIAGNOSIS — Z01812 Encounter for preprocedural laboratory examination: Secondary | ICD-10-CM | POA: Diagnosis not present

## 2019-06-12 LAB — CBC WITH DIFFERENTIAL/PLATELET
Abs Immature Granulocytes: 0.01 10*3/uL (ref 0.00–0.07)
Basophils Absolute: 0 10*3/uL (ref 0.0–0.1)
Basophils Relative: 0 %
Eosinophils Absolute: 0.1 10*3/uL (ref 0.0–0.5)
Eosinophils Relative: 3 %
HCT: 39.6 % (ref 36.0–46.0)
Hemoglobin: 12.7 g/dL (ref 12.0–15.0)
Immature Granulocytes: 0 %
Lymphocytes Relative: 50 %
Lymphs Abs: 2.5 10*3/uL (ref 0.7–4.0)
MCH: 27.4 pg (ref 26.0–34.0)
MCHC: 32.1 g/dL (ref 30.0–36.0)
MCV: 85.3 fL (ref 80.0–100.0)
Monocytes Absolute: 0.3 10*3/uL (ref 0.1–1.0)
Monocytes Relative: 6 %
Neutro Abs: 2.1 10*3/uL (ref 1.7–7.7)
Neutrophils Relative %: 41 %
Platelets: 249 10*3/uL (ref 150–400)
RBC: 4.64 MIL/uL (ref 3.87–5.11)
RDW: 14.5 % (ref 11.5–15.5)
WBC: 5.1 10*3/uL (ref 4.0–10.5)
nRBC: 0 % (ref 0.0–0.2)

## 2019-06-12 NOTE — Progress Notes (Signed)
      Enhanced Recovery after Surgery for Orthopedics Enhanced Recovery after Surgery is a protocol used to improve the stress on your body and your recovery after surgery.  Patient Instructions  . The night before surgery:  o No food after midnight. ONLY clear liquids after midnight  . The day of surgery (if you do NOT have diabetes):  o Drink ONE (1) Pre-Surgery Clear Ensure as directed.   o This drink was given to you during your hospital  pre-op appointment visit. o The pre-op nurse will instruct you on the time to drink the  Pre-Surgery Ensure depending on your surgery time. o Finish the drink at the designated time by the pre-op nurse.  o Nothing else to drink after completing the  Pre-Surgery Clear Ensure.  . The day of surgery (if you have diabetes): o Drink ONE (1) Gatorade 2 (G2) as directed. o This drink was given to you during your hospital  pre-op appointment visit.  o The pre-op nurse will instruct you on the time to drink the   Gatorade 2 (G2) depending on your surgery time. o Color of the Gatorade may vary. Red is not allowed. o Nothing else to drink after completing the  Gatorade 2 (G2).         If you have questions, please contact your surgeon's office.  Hibiclens given to pt with instructions. 

## 2019-06-14 ENCOUNTER — Other Ambulatory Visit (HOSPITAL_COMMUNITY)
Admission: RE | Admit: 2019-06-14 | Discharge: 2019-06-14 | Disposition: A | Discharge status: Home / Self Care | Payer: Medicaid Other | Source: Ambulatory Visit | Attending: Surgery | Admitting: Surgery

## 2019-06-14 DIAGNOSIS — Z20828 Contact with and (suspected) exposure to other viral communicable diseases: Secondary | ICD-10-CM | POA: Diagnosis not present

## 2019-06-14 DIAGNOSIS — Z01812 Encounter for preprocedural laboratory examination: Secondary | ICD-10-CM | POA: Diagnosis present

## 2019-06-15 LAB — NOVEL CORONAVIRUS, NAA (HOSP ORDER, SEND-OUT TO REF LAB; TAT 18-24 HRS): SARS-CoV-2, NAA: NOT DETECTED

## 2019-06-17 NOTE — Anesthesia Preprocedure Evaluation (Addendum)
Anesthesia Evaluation  Patient identified by MRN, date of birth, ID band Patient awake    Reviewed: Allergy & Precautions, NPO status , Patient's Chart, lab work & pertinent test results  Airway Mallampati: II  TM Distance: >3 FB Neck ROM: Full    Dental no notable dental hx. (+) Teeth Intact, Dental Advisory Given   Pulmonary asthma , former smoker,    Pulmonary exam normal breath sounds clear to auscultation       Cardiovascular Exercise Tolerance: Good negative cardio ROS Normal cardiovascular exam Rhythm:Regular Rate:Normal     Neuro/Psych  Headaches, PSYCHIATRIC DISORDERS Anxiety    GI/Hepatic Neg liver ROS, GERD  ,  Endo/Other  negative endocrine ROS  Renal/GU negative Renal ROS     Musculoskeletal   Abdominal   Peds  Hematology negative hematology ROS (+)   Anesthesia Other Findings   Reproductive/Obstetrics                            Anesthesia Physical Anesthesia Plan  ASA: II  Anesthesia Plan: MAC   Post-op Pain Management:    Induction: Intravenous  PONV Risk Score and Plan: 3 and Treatment may vary due to age or medical condition, Ondansetron, Dexamethasone and Midazolam  Airway Management Planned: Natural Airway and Nasal Cannula  Additional Equipment:   Intra-op Plan:   Post-operative Plan:   Informed Consent: I have reviewed the patients History and Physical, chart, labs and discussed the procedure including the risks, benefits and alternatives for the proposed anesthesia with the patient or authorized representative who has indicated his/her understanding and acceptance.     Dental advisory given  Plan Discussed with: CRNA and Anesthesiologist  Anesthesia Plan Comments:        Anesthesia Quick Evaluation

## 2019-06-18 ENCOUNTER — Encounter (HOSPITAL_BASED_OUTPATIENT_CLINIC_OR_DEPARTMENT_OTHER)
Admission: RE | Disposition: A | Discharge status: Home / Self Care | Payer: Self-pay | Source: Home / Self Care | Attending: Surgery

## 2019-06-18 ENCOUNTER — Encounter (HOSPITAL_BASED_OUTPATIENT_CLINIC_OR_DEPARTMENT_OTHER): Payer: Self-pay

## 2019-06-18 ENCOUNTER — Ambulatory Visit (HOSPITAL_BASED_OUTPATIENT_CLINIC_OR_DEPARTMENT_OTHER)
Admission: RE | Admit: 2019-06-18 | Discharge: 2019-06-18 | Disposition: A | Discharge status: Home / Self Care | Payer: Medicaid Other | Attending: Surgery | Admitting: Surgery

## 2019-06-18 ENCOUNTER — Other Ambulatory Visit: Payer: Self-pay

## 2019-06-18 ENCOUNTER — Ambulatory Visit (HOSPITAL_BASED_OUTPATIENT_CLINIC_OR_DEPARTMENT_OTHER): Payer: Medicaid Other | Admitting: Anesthesiology

## 2019-06-18 DIAGNOSIS — Z853 Personal history of malignant neoplasm of breast: Secondary | ICD-10-CM | POA: Diagnosis not present

## 2019-06-18 DIAGNOSIS — Z452 Encounter for adjustment and management of vascular access device: Secondary | ICD-10-CM | POA: Insufficient documentation

## 2019-06-18 DIAGNOSIS — M797 Fibromyalgia: Secondary | ICD-10-CM | POA: Insufficient documentation

## 2019-06-18 DIAGNOSIS — Z79899 Other long term (current) drug therapy: Secondary | ICD-10-CM | POA: Insufficient documentation

## 2019-06-18 DIAGNOSIS — Z87891 Personal history of nicotine dependence: Secondary | ICD-10-CM | POA: Diagnosis not present

## 2019-06-18 DIAGNOSIS — Z791 Long term (current) use of non-steroidal anti-inflammatories (NSAID): Secondary | ICD-10-CM | POA: Insufficient documentation

## 2019-06-18 DIAGNOSIS — Z880 Allergy status to penicillin: Secondary | ICD-10-CM | POA: Insufficient documentation

## 2019-06-18 DIAGNOSIS — K219 Gastro-esophageal reflux disease without esophagitis: Secondary | ICD-10-CM | POA: Diagnosis not present

## 2019-06-18 DIAGNOSIS — J45909 Unspecified asthma, uncomplicated: Secondary | ICD-10-CM | POA: Diagnosis not present

## 2019-06-18 HISTORY — PX: PORT-A-CATH REMOVAL: SHX5289

## 2019-06-18 SURGERY — REMOVAL PORT-A-CATH
Anesthesia: Monitor Anesthesia Care | Site: Chest | Laterality: Right

## 2019-06-18 MED ORDER — FENTANYL CITRATE (PF) 100 MCG/2ML IJ SOLN: 50.0000 ug | INTRAMUSCULAR | Status: DC | PRN

## 2019-06-18 MED ORDER — LACTATED RINGERS IV SOLN: INTRAVENOUS | Status: DC

## 2019-06-18 MED ORDER — MIDAZOLAM HCL 2 MG/2ML IJ SOLN: 1.0000 mg | INTRAMUSCULAR | Status: DC | PRN

## 2019-06-18 MED ORDER — ACETAMINOPHEN 10 MG/ML IV SOLN: 1000.0000 mg | Freq: Once | INTRAVENOUS | Status: DC | PRN

## 2019-06-18 MED ORDER — ONDANSETRON HCL 4 MG/2ML IJ SOLN
INTRAMUSCULAR | Status: DC | PRN
  Administered 2019-06-18: 4 mg via INTRAVENOUS

## 2019-06-18 MED ORDER — BUPIVACAINE-EPINEPHRINE 0.25% -1:200000 IJ SOLN
INTRAMUSCULAR | Status: DC | PRN
  Administered 2019-06-18: 15 mL

## 2019-06-18 MED ORDER — MIDAZOLAM HCL 5 MG/5ML IJ SOLN
INTRAMUSCULAR | Status: DC | PRN
  Administered 2019-06-18: 1 mg via INTRAVENOUS

## 2019-06-18 MED ORDER — BUPIVACAINE-EPINEPHRINE (PF) 0.5% -1:200000 IJ SOLN
INTRAMUSCULAR | Status: AC
  Filled 2019-06-18: qty 30

## 2019-06-18 MED ORDER — CLINDAMYCIN PHOSPHATE 900 MG/50ML IV SOLN
INTRAVENOUS | Status: AC
  Filled 2019-06-18: qty 50

## 2019-06-18 MED ORDER — ONDANSETRON HCL 4 MG/2ML IJ SOLN: 4.0000 mg | Freq: Once | INTRAMUSCULAR | Status: DC | PRN

## 2019-06-18 MED ORDER — GABAPENTIN 300 MG PO CAPS
ORAL_CAPSULE | ORAL | Status: AC
  Filled 2019-06-18: qty 1

## 2019-06-18 MED ORDER — CLINDAMYCIN PHOSPHATE 900 MG/50ML IV SOLN
900.0000 mg | INTRAVENOUS | Status: AC
  Administered 2019-06-18: 09:00:00 900 mg via INTRAVENOUS

## 2019-06-18 MED ORDER — PROPOFOL 500 MG/50ML IV EMUL
INTRAVENOUS | Status: DC | PRN
  Administered 2019-06-18: 100 ug/kg/min via INTRAVENOUS

## 2019-06-18 MED ORDER — HYDROMORPHONE HCL 1 MG/ML IJ SOLN: 0.2500 mg | INTRAMUSCULAR | Status: DC | PRN

## 2019-06-18 MED ORDER — HYDROCODONE-ACETAMINOPHEN 7.5-325 MG PO TABS: 1.0000 | ORAL_TABLET | Freq: Once | ORAL | Status: DC | PRN

## 2019-06-18 MED ORDER — SCOPOLAMINE 1 MG/3DAYS TD PT72: 1.0000 | MEDICATED_PATCH | Freq: Once | TRANSDERMAL | Status: DC

## 2019-06-18 MED ORDER — CHLORHEXIDINE GLUCONATE CLOTH 2 % EX PADS: 6.0000 | MEDICATED_PAD | Freq: Once | CUTANEOUS | Status: DC

## 2019-06-18 MED ORDER — PROPOFOL 10 MG/ML IV BOLUS
INTRAVENOUS | Status: DC | PRN
  Administered 2019-06-18 (×2): 30 mg via INTRAVENOUS
  Administered 2019-06-18: 20 mg via INTRAVENOUS

## 2019-06-18 MED ORDER — PROPOFOL 10 MG/ML IV BOLUS
INTRAVENOUS | Status: AC
  Filled 2019-06-18: qty 20

## 2019-06-18 MED ORDER — CELECOXIB 200 MG PO CAPS
ORAL_CAPSULE | ORAL | Status: AC
  Filled 2019-06-18: qty 1

## 2019-06-18 MED ORDER — ACETAMINOPHEN 500 MG PO TABS
ORAL_TABLET | ORAL | Status: AC
  Filled 2019-06-18: qty 2

## 2019-06-18 MED ORDER — ACETAMINOPHEN 160 MG/5ML PO SOLN: 325.0000 mg | ORAL | Status: DC | PRN

## 2019-06-18 MED ORDER — BUPIVACAINE HCL (PF) 0.25 % IJ SOLN
INTRAMUSCULAR | Status: AC
  Filled 2019-06-18: qty 30

## 2019-06-18 MED ORDER — ACETAMINOPHEN 325 MG PO TABS: 325.0000 mg | ORAL_TABLET | ORAL | Status: DC | PRN

## 2019-06-18 MED ORDER — CELECOXIB 200 MG PO CAPS
200.0000 mg | ORAL_CAPSULE | ORAL | Status: AC
  Administered 2019-06-18: 200 mg via ORAL

## 2019-06-18 MED ORDER — GABAPENTIN 300 MG PO CAPS
300.0000 mg | ORAL_CAPSULE | ORAL | Status: AC
  Administered 2019-06-18: 08:00:00 300 mg via ORAL

## 2019-06-18 MED ORDER — MIDAZOLAM HCL 2 MG/2ML IJ SOLN
INTRAMUSCULAR | Status: AC
  Filled 2019-06-18: qty 2

## 2019-06-18 MED ORDER — LIDOCAINE HCL (PF) 1 % IJ SOLN
INTRAMUSCULAR | Status: AC
  Filled 2019-06-18: qty 30

## 2019-06-18 MED ORDER — ACETAMINOPHEN 500 MG PO TABS
1000.0000 mg | ORAL_TABLET | ORAL | Status: AC
  Administered 2019-06-18: 1000 mg via ORAL

## 2019-06-18 SURGICAL SUPPLY — 42 items
ADH SKN CLS APL DERMABOND .7 (GAUZE/BANDAGES/DRESSINGS) ×1
APL PRP STRL LF DISP 70% ISPRP (MISCELLANEOUS) ×1
APL SKNCLS STERI-STRIP NONHPOA (GAUZE/BANDAGES/DRESSINGS)
BENZOIN TINCTURE PRP APPL 2/3 (GAUZE/BANDAGES/DRESSINGS) IMPLANT
BLADE SURG 15 STRL LF DISP TIS (BLADE) ×1 IMPLANT
BLADE SURG 15 STRL SS (BLADE) ×3
CHLORAPREP W/TINT 26 (MISCELLANEOUS) ×3 IMPLANT
CLOSURE WOUND 1/2 X4 (GAUZE/BANDAGES/DRESSINGS)
COVER BACK TABLE REUSABLE LG (DRAPES) ×3 IMPLANT
COVER MAYO STAND REUSABLE (DRAPES) ×3 IMPLANT
COVER WAND RF STERILE (DRAPES) IMPLANT
DECANTER SPIKE VIAL GLASS SM (MISCELLANEOUS) ×3 IMPLANT
DERMABOND ADVANCED (GAUZE/BANDAGES/DRESSINGS) ×2
DERMABOND ADVANCED .7 DNX12 (GAUZE/BANDAGES/DRESSINGS) ×1 IMPLANT
DRAPE LAPAROTOMY 100X72 PEDS (DRAPES) ×3 IMPLANT
DRAPE UTILITY XL STRL (DRAPES) ×3 IMPLANT
ELECT REM PT RETURN 9FT ADLT (ELECTROSURGICAL) ×3
ELECTRODE REM PT RTRN 9FT ADLT (ELECTROSURGICAL) ×1 IMPLANT
GLOVE BIO SURGEON STRL SZ7 (GLOVE) ×2 IMPLANT
GLOVE BIOGEL PI IND STRL 7.0 (GLOVE) IMPLANT
GLOVE BIOGEL PI IND STRL 7.5 (GLOVE) IMPLANT
GLOVE BIOGEL PI IND STRL 8 (GLOVE) ×1 IMPLANT
GLOVE BIOGEL PI INDICATOR 7.0 (GLOVE) ×2
GLOVE BIOGEL PI INDICATOR 7.5 (GLOVE) ×2
GLOVE BIOGEL PI INDICATOR 8 (GLOVE) ×2
GLOVE ECLIPSE 8.0 STRL XLNG CF (GLOVE) ×3 IMPLANT
GOWN STRL REUS W/ TWL LRG LVL3 (GOWN DISPOSABLE) ×2 IMPLANT
GOWN STRL REUS W/ TWL XL LVL3 (GOWN DISPOSABLE) IMPLANT
GOWN STRL REUS W/TWL LRG LVL3 (GOWN DISPOSABLE) ×6
GOWN STRL REUS W/TWL XL LVL3 (GOWN DISPOSABLE) ×3
NDL HYPO 25X1 1.5 SAFETY (NEEDLE) ×1 IMPLANT
NEEDLE HYPO 25X1 1.5 SAFETY (NEEDLE) ×3 IMPLANT
NS IRRIG 1000ML POUR BTL (IV SOLUTION) ×3 IMPLANT
PACK BASIN DAY SURGERY FS (CUSTOM PROCEDURE TRAY) ×3 IMPLANT
PENCIL BUTTON HOLSTER BLD 10FT (ELECTRODE) ×2 IMPLANT
SLEEVE SCD COMPRESS KNEE MED (MISCELLANEOUS) ×2 IMPLANT
SPONGE LAP 4X18 RFD (DISPOSABLE) ×3 IMPLANT
STRIP CLOSURE SKIN 1/2X4 (GAUZE/BANDAGES/DRESSINGS) IMPLANT
SUT MON AB 4-0 PC3 18 (SUTURE) ×3 IMPLANT
SUT VICRYL 3-0 CR8 SH (SUTURE) ×3 IMPLANT
SYR CONTROL 10ML LL (SYRINGE) ×3 IMPLANT
TOWEL GREEN STERILE FF (TOWEL DISPOSABLE) ×3 IMPLANT

## 2019-06-18 NOTE — Anesthesia Postprocedure Evaluation (Signed)
Anesthesia Post Note  Patient: Katherine [name]  Procedure(s) Performed: REMOVAL PORT-A-CATH (Right Chest)     Patient location during evaluation: PACU Anesthesia Type: MAC Level of consciousness: awake and alert Pain management: pain level controlled Vital Signs Assessment: post-procedure vital signs reviewed and stable Respiratory status: spontaneous breathing, nonlabored ventilation, respiratory function stable and patient connected to nasal cannula oxygen Cardiovascular status: blood pressure returned to baseline and stable Postop Assessment: no apparent nausea or vomiting Anesthetic complications: no    Last Vitals:  Vitals:   06/18/19 0930 06/18/19 0945  BP: 117/83 128/72  Pulse: (!) 56 (!) 55  Resp: 17 18  Temp:  36.6 C  SpO2: 100% 100%    Last Pain:  Vitals:   06/18/19 0945  TempSrc:   PainSc: 0-No pain                 Barnet Glasgow

## 2019-06-18 NOTE — H&P (Signed)
Katherine [name] is an 48 y.o. female.   Chief Complaint: port in place HPI: Pt here for port removal after completing chemotherapy for breast cancer   Past Medical History:  Diagnosis Date  . Anxiety   . Asthma   . Dysrhythmia    History of SVT-No medications  . Fibromyalgia   . GERD (gastroesophageal reflux disease)   . History of radiation therapy 12/19/17- 01/15/18   Right Breast, 2.67 Gy in 15 fractions for a total dose of 40.05 Gy. Boost, 2 Gy in 5 fractions for a total dose of 10 Gy  . Hot flashes   . Malignant neoplasm of upper-outer quadrant of right female breast ([place]) 06/2017   right breast  . Migraine   . Palpitations   . Pneumonia    with cavitation of left lower lobe  . Tuberculosis    exposure as a child, get checked frequently  . Uterine fibroid     Past Surgical History:  Procedure Laterality Date  . ABDOMINAL HYSTERECTOMY    . BREAST LUMPECTOMY Right   . BREAST LUMPECTOMY WITH RADIOACTIVE SEED AND SENTINEL LYMPH NODE BIOPSY Right 07/24/2017   Procedure: RIGHT BREAST LUMPECTOMY WITH RADIOACTIVE SEED AND SENTINEL LYMPH NODE BIOPSY;  Surgeon: Erroll Luna, MD;  Location: [facility];  Service: General;  Laterality: Right;  . PORTACATH PLACEMENT Right 07/24/2017   Procedure: INSERTION PORT-A-CATH;  Surgeon: Erroll Luna, MD;  Location: [facility];  Service: General;  Laterality: Right;  . TUBAL LIGATION  1993  . VAGINAL HYSTERECTOMY Bilateral 12/19/2016   Procedure: HYSTERECTOMY VAGINAL;  Surgeon: Lavonia Drafts, MD;  Location: [facility];  Service: Gynecology;  Laterality: Bilateral;  . WISDOM TOOTH EXTRACTION      Family History  Problem Relation Age of Onset  . Migraines Daughter   . Hypertension Mother   . Heart disease Mother   . Hypertension Father   . Heart disease Father   . Migraines Son   . Hypertension Other   . Colon cancer Neg Hx   . Colon polyps Neg Hx   . Diabetes Neg Hx   . Kidney disease Neg Hx   .  Liver disease Neg Hx   . Heart attack Neg Hx   . Stroke Neg Hx    Social History:  reports that she quit smoking about 23 months ago. Her smoking use included cigarettes. She smoked 0.50 packs per day. She has never used smokeless tobacco. She reports current drug use. Drug: Marijuana. She reports that she does not drink alcohol.  Allergies:  Allergies  Allergen Reactions  . Shellfish Allergy Anaphylaxis  . Penicillins Other (See Comments)    Yeast infection  Has patient had a PCN reaction causing immediate rash, facial/tongue/throat swelling, SOB or lightheadedness with hypotension: no Has patient had a PCN reaction causing severe rash involving mucus membranes or skin necrosis: no Has patient had a PCN reaction that required hospitalization no Has patient had a PCN reaction occurring within the last 10 years:no If all of the above answers are "NO", then may proceed with Cephalosporin use.   . Tomato Rash    Medications Prior to Admission  Medication Sig Dispense Refill  . albuterol (PROVENTIL HFA;VENTOLIN HFA) 108 (90 Base) MCG/ACT inhaler Inhale 2 puffs into the lungs every 6 (six) hours as needed for wheezing or shortness of breath.    . DULoxetine (CYMBALTA) 30 MG capsule Take 30 mg by mouth daily.    Marland Kitchen esomeprazole (NEXIUM) 40 MG capsule  TAKE 1 CAPSULE BY MOUTH BEFORE FIRST MEAL OF THE DAY    . ibuprofen (ADVIL,MOTRIN) 600 MG tablet Take 1 tablet (600 mg total) by mouth every 6 (six) hours as needed. 30 tablet 0  . meloxicam (MOBIC) 7.5 MG tablet TAKE ONE TABLET BY MOUTH DAILY FOR PAIN, TAKE WITH FOOD    . metoprolol succinate (TOPROL-XL) 25 MG 24 hr tablet Take 25 mg by mouth daily.    Marland Kitchen oxyCODONE-acetaminophen (PERCOCET/ROXICET) 5-325 MG tablet Take 1 tablet by mouth every 8 (eight) hours as needed for severe pain. 30 tablet 0  . propranolol (INDERAL) 10 MG tablet Take 1 tablet (10 mg total) by mouth daily as needed. 20 tablet 2    No results found for this or any previous  visit (from the past 48 hour(s)). No results found.  Review of Systems  All other systems reviewed and are negative.   Blood pressure (!) 143/88, pulse (!) 53, temperature 99.1 F (37.3 C), temperature source Oral, resp. rate 20, height 5\' 5"  (1.651 m), weight 72.4 kg, last menstrual period 11/13/2016, SpO2 100 %. Physical Exam  Constitutional: She is oriented to person, place, and time. She appears well-developed.  HENT:  Head: Normocephalic.  Eyes: EOM are normal.  Neck: Normal range of motion.  Cardiovascular: Normal rate.  Respiratory:  R Lat[place] PORT   Musculoskeletal: Normal range of motion.  Neurological: She is alert and oriented to person, place, and time.  Skin: Skin is warm.     Assessment/Plan PORT IN PLACE  PORT REMOVAL The procedure has been discussed with the patient.  Alternative therapies have been discussed with the patient.  Operative risks include bleeding,  Infection,  Organ injury,  Nerve injury,  Blood vessel injury,  DVT,  Pulmonary embolism,  Death,  And possible reoperation.  Medical management risks include worsening of present situation.  The success of the procedure is 50 -90 % at treating patients symptoms.  The patient understands and agrees to proceed.  Turner Daniels, MD 06/18/2019, 8:19 AM

## 2019-06-18 NOTE — Transfer of Care (Signed)
//  Immediate Anesthesia Transfer of Care Note  Patient: SYBLE PICCO  Procedure(s) Performed: REMOVAL PORT-A-CATH (Right Chest)  Patient Location: PACU  Anesthesia Type:MAC  Level of Consciousness: drowsy  Airway & Oxygen Therapy: Patient Spontanous Breathing and Patient connected to face mask oxygen  Post-op Assessment: Report given to RN and Post -op Vital signs reviewed and stable  Post vital signs: Reviewed and stable  Last Vitals:  Vitals Value Taken Time  BP 103/69 06/18/19 0904  Temp    Pulse 81 06/18/19 0906  Resp 21 06/18/19 0906  SpO2 100 % 06/18/19 0906  Vitals shown include unvalidated device data.  Last Pain:  Vitals:   06/18/19 0744  TempSrc: Oral  PainSc: 0-No pain         Complications: No apparent anesthesia complications

## 2019-06-18 NOTE — Op Note (Signed)
Preop diagnosis: Indwelling port a catheter for chemotherapy  Postop diagnosis: Same  Procedure: Removal of port a catheter  Surgeon: Torianna Junio M.D.  Anesthesia: MAC with local  EBL: Minimal  Specimen none  Drains: None  Indications for procedure: The patient presents for removal of port a catheter after completing chemotherapy. The patient no longer requires central venous access. Risks of bleeding, infection, catheter fragmentation, embolization, arrhythmias and damage to arteries, veins and nerves and possibly other mediastinal structures discussed. The patient agrees to proceed.  Description of procedure: The patient was seen in the holding area. Questions were answered. The patient agreed to proceed. The patient was taken to the operating room. The patient was placed supine. Anesthesia was initiated. The skin on the upper chest was prepped and draped in a sterile fashion. Timeout was done. The patient received preoperative antibiotics. Incision was made through the old port site and the hub of the Port-A-Cath was seen. The sutures were cut to release the port from the chest wall. The catheter was removed in its entirety without difficulty. The tract was closed with 3-0 Vicryl. 4 Monocryl was used to close the skin. All final counts were correct. The patient was taken to recovery in satisfactory condition.  

## 2019-06-18 NOTE — Discharge Instructions (Signed)
GENERAL SURGERY: POST OP INSTRUCTIONS  ######################################################################  EAT Gradually transition to a high fiber diet with a fiber supplement over the next few weeks after discharge.  Start with a pureed / full liquid diet (see below)  WALK Walk an hour a day.  Control your pain to do that.    CONTROL PAIN Control pain so that you can walk, sleep, tolerate sneezing/coughing, go up/down stairs.  HAVE A BOWEL MOVEMENT DAILY Keep your bowels regular to avoid problems.  OK to try a laxative to override constipation.  OK to use an antidairrheal to slow down diarrhea.  Call if not better after 2 tries  CALL IF YOU HAVE PROBLEMS/CONCERNS Call if you are still struggling despite following these instructions. Call if you have concerns not answered by these instructions  ######################################################################    1. DIET: Follow a light bland diet & liquids the first 24 hours after arrival home, such as soup, liquids, starches, etc.  Be sure to drink plenty of fluids.  Quickly advance to a usual solid diet within a few days.  Avoid fast food or heavy meals as your are more likely to get nauseated or have irregular bowels.  A low-fat, high-fiber diet for the rest of your life is ideal.   2. Take your usually prescribed home medications unless otherwise directed. 3. PAIN CONTROL: a. Pain is best controlled by a usual combination of three different methods TOGETHER: i. Ice/Heat ii. Over the counter pain medication iii. Prescription pain medication b. Most patients will experience some swelling and bruising around the incisions.  Ice packs or heating pads (30-60 minutes up to 6 times a day) will help. Use ice for the first few days to help decrease swelling and bruising, then switch to heat to help relax tight/sore spots and speed recovery.  Some people prefer to use ice alone, heat alone, alternating between ice & heat.   Experiment to what works for you.  Swelling and bruising can take several weeks to resolve.   c. It is helpful to take an over-the-counter pain medication regularly for the first few weeks.  Choose one of the following that works best for you: i. Naproxen (Aleve, etc)  Two 220mg tabs twice a day ii. Ibuprofen (Advil, etc) Three 200mg tabs four times a day (every meal & bedtime) iii. Acetaminophen (Tylenol, etc) 500-650mg four times a day (every meal & bedtime) d. A  prescription for pain medication (such as oxycodone, hydrocodone, etc) should be given to you upon discharge.  Take your pain medication as prescribed.  i. If you are having problems/concerns with the prescription medicine (does not control pain, nausea, vomiting, rash, itching, etc), please call us [phone] to see if we need to switch you to a different pain medicine that will work better for you and/or control your side effect better. ii. If you need a refill on your pain medication, please contact your pharmacy.  They will contact our office to request authorization. Prescriptions will not be filled after 5 pm or on week-ends. 4. Avoid getting constipated.  Between the surgery and the pain medications, it is common to experience some constipation.  Increasing fluid intake and taking a fiber supplement (such as Metamucil, Citrucel, FiberCon, MiraLax, etc) 1-2 times a day regularly will usually help prevent this problem from occurring.  A mild laxative (prune juice, Milk of Magnesia, MiraLax, etc) should be taken according to package directions if there are no bowel movements after 48 hours.   5. Wash /   shower every day.  You may shower over the dressings as they are waterproof.  Continue to shower over incision(s) after the dressing is off. 6. Remove your waterproof bandages 5 days after surgery.  You may leave the incision open to air.  You may have skin tapes (Steri Strips) covering the incision(s).  Leave them on until one week, then  remove.  You may replace a dressing/Band-Aid to cover the incision for comfort if you wish.      7. ACTIVITIES as tolerated:   a. You may resume regular (light) daily activities beginning the next day--such as daily self-care, walking, climbing stairs--gradually increasing activities as tolerated.  If you can walk 30 minutes without difficulty, it is safe to try more intense activity such as jogging, treadmill, bicycling, low-impact aerobics, swimming, etc. b. Save the most intensive and strenuous activity for last such as sit-ups, heavy lifting, contact sports, etc  Refrain from any heavy lifting or straining until you are off narcotics for pain control.   c. DO NOT PUSH THROUGH PAIN.  Let pain be your guide: If it hurts to do something, don't do it.  Pain is your body warning you to avoid that activity for another week until the pain goes down. d. You may drive when you are no longer taking prescription pain medication, you can comfortably wear a seatbelt, and you can safely maneuver your car and apply brakes. e. You may have sexual intercourse when it is comfortable.  8. FOLLOW UP in our office a. Please call [facility] at [phone] to set up an appointment to see your surgeon in the office for a follow-up appointment approximately 2-3 weeks after your surgery. b. Make sure that you call for this appointment the day you arrive home to insure a convenient appointment time. 9. IF YOU HAVE DISABILITY OR FAMILY LEAVE FORMS, BRING THEM TO THE OFFICE FOR PROCESSING.  DO NOT GIVE THEM TO YOUR DOCTOR.   WHEN TO CALL US [phone]: 1. Poor pain control 2. Reactions / problems with new medications (rash/itching, nausea, etc)  3. Fever over 101.5 F (38.5 C) 4. Worsening swelling or bruising 5. Continued bleeding from incision. 6. Increased pain, redness, or drainage from the incision 7. Difficulty breathing / swallowing   The clinic staff is available to answer your questions during regular  business hours (8:30am-5pm).  Please don't hesitate to call and ask to speak to one of our nurses for clinical concerns.   If you have a medical emergency, go to the nearest emergency room or call 911.  A surgeon from [facility] is always on call at the hospitals   [facility] [address], [address], [place], [place]  [address] ? MAIN: [phone] ? TOLL FREE: [phone] ?  FAX [phone] [email or web address]    Post Anesthesia Home Care Instructions  Activity: Get plenty of rest for the remainder of the day. A responsible individual must stay with you for 24 hours following the procedure.  For the next 24 hours, DO NOT: -Drive a car -Operate machinery -Drink alcoholic beverages -Take any medication unless instructed by your physician -Make any legal decisions or sign important papers.  Meals: Start with liquid foods such as gelatin or soup. Progress to regular foods as tolerated. Avoid greasy, spicy, heavy foods. If nausea and/or vomiting occur, drink only clear liquids until the nausea and/or vomiting subsides. Call your physician if vomiting continues.  Special Instructions/Symptoms: Your throat may feel dry or   sore from the anesthesia or the breathing tube placed in your throat during surgery. If this causes discomfort, gargle with warm salt water. The discomfort should disappear within 24 hours.  If you had a scopolamine patch placed behind your ear for the management of post- operative nausea and/or vomiting:  1. The medication in the patch is effective for 72 hours, after which it should be removed.  Wrap patch in a tissue and discard in the trash. Wash hands thoroughly with soap and water. 2. You may remove the patch earlier than 72 hours if you experience unpleasant side effects which may include dry mouth, dizziness or visual disturbances. 3. Avoid touching the patch. Wash your hands with soap and water after  contact with the patch.     

## 2019-06-18 NOTE — Interval H&P Note (Signed)
History and Physical Interval Note:  06/18/2019 8:21 AM  Katherine [name]  has presented today for surgery, with the diagnosis of PORT.  The various methods of treatment have been discussed with the patient and family. After consideration of risks, benefits and other options for treatment, the patient has consented to  Procedure(s): REMOVAL PORT-A-CATH (N/A) as a surgical intervention.  The patient's history has been reviewed, patient examined, no change in status, stable for surgery.  I have reviewed the patient's chart and labs.  Questions were answered to the patient's satisfaction.     [place]

## 2019-06-19 ENCOUNTER — Institutional Professional Consult (permissible substitution): Payer: Medicaid Other | Admitting: Internal Medicine

## 2019-06-19 ENCOUNTER — Telehealth: Payer: Medicaid Other | Admitting: Internal Medicine

## 2019-06-20 ENCOUNTER — Encounter (HOSPITAL_BASED_OUTPATIENT_CLINIC_OR_DEPARTMENT_OTHER): Payer: Self-pay | Admitting: Surgery

## 2019-07-01 ENCOUNTER — Encounter: Payer: Self-pay | Admitting: Internal Medicine

## 2019-07-03 ENCOUNTER — Encounter: Payer: Self-pay | Admitting: Internal Medicine

## 2019-08-21 ENCOUNTER — Other Ambulatory Visit: Payer: Self-pay | Admitting: Physician Assistant

## 2019-10-13 ENCOUNTER — Telehealth: Payer: Self-pay | Admitting: Hematology and Oncology

## 2019-10-13 NOTE — Telephone Encounter (Signed)
Scheduled per 1/15 sch msg. Called and spoke with pt, confirmed 3/12 appt

## 2019-11-14 ENCOUNTER — Other Ambulatory Visit (HOSPITAL_COMMUNITY): Payer: Self-pay | Admitting: Cardiology

## 2019-11-17 NOTE — Progress Notes (Signed)
Cardiology Office Note:    Date:  11/18/2019   ID:  Katherine [name], DOB 1971-07-30, MRN [id number]  PCP:  Javier Docker, MD  Cardiologist:  Dorris Carnes, MD  Electrophysiologist:  None  [facility]:  Loralie Champagne, MD  - released in 08/2018  Referring MD: Javier Docker, MD   Chief Complaint:  Follow-up (SVT)    Patient Profile:    Katherine [name] is a 49 y.o. female with:   SVT ? Palpitations 9/19: Zio w/o sig arrhythmia, Tx with Toprol XL  Chest pain   Breast CA (ER-/PR-/HER2+) ? S/p R lumpectomy ? Taxol, Herceptin Rx; Herceptin completed 12/19 ? Followed by Dr. Aundra Dubin w/ [facility] >> serial echo's with nl EF and nl GLS  Last AHF clinic visit 12/19 >> FU prn  Prior CV studies:  Echocardiogram 09/05/2018 EF 55-60, normal wall motion, GLS -44%, grade 2 diastolic dysfunction, mild MR, LA upper limits of normal, normal RV SF  Long-term cardiac monitor (3-14 days) 05/2018 Predominantly NSR with rare PVCs and PACs.  Stress echocardiogram 12/16/2015 Normal   History of Present Illness:    Katherine [name] was last seen in clinic in 04/2019.  She had symptoms of rapid palpitations.  I placed her on short acting propranolol to take as needed and referred her back to EP (Dr. Lovena Le).  She did not keep this appt.   She returns for follow-up.  She is here alone.  She continues to have episodes of rapid palpitations.  These happen almost daily.  The as needed propranolol sometimes helps.  She has been having more issues with her asthma.  She does note shortness of breath with some activities.  She really has not had chest pain.  She has not had syncope.  She sleeps on several pillows, chronically.  She has not had significant leg swelling.  Her symptoms have lasted as long as 30 minutes in the past.  She is afraid to call EMS or go to the ED due to COVID-19.    Past Medical History:  Diagnosis Date  . Anxiety   . Asthma   . Dysrhythmia    History of SVT-No  medications  . Fibromyalgia   . GERD (gastroesophageal reflux disease)   . History of radiation therapy 12/19/17- 01/15/18   Right Breast, 2.67 Gy in 15 fractions for a total dose of 40.05 Gy. Boost, 2 Gy in 5 fractions for a total dose of 10 Gy  . Hot flashes   . Malignant neoplasm of upper-outer quadrant of right female breast ([place]) 06/2017   right breast  . Migraine   . Palpitations   . Pneumonia    with cavitation of left lower lobe  . Tuberculosis    exposure as a child, get checked frequently  . Uterine fibroid     Current Medications: Current Meds  Medication Sig  . albuterol (PROVENTIL HFA;VENTOLIN HFA) 108 (90 Base) MCG/ACT inhaler Inhale 2 puffs into the lungs every 6 (six) hours as needed for wheezing or shortness of breath.  . DULoxetine (CYMBALTA) 30 MG capsule Take 30 mg by mouth daily.  Marland Kitchen esomeprazole (NEXIUM) 40 MG capsule TAKE 1 CAPSULE BY MOUTH BEFORE FIRST MEAL OF THE DAY  . ibuprofen (ADVIL,MOTRIN) 600 MG tablet Take 1 tablet (600 mg total) by mouth every 6 (six) hours as needed.  . meloxicam (MOBIC) 7.5 MG tablet TAKE ONE TABLET BY MOUTH DAILY FOR PAIN, TAKE WITH FOOD  . metoprolol succinate (TOPROL-XL) 25  MG 24 hr tablet TAKE 1 TABLET BY MOUTH EVERY DAY  . oxyCODONE-acetaminophen (PERCOCET/ROXICET) 5-325 MG tablet Take 1 tablet by mouth every 8 (eight) hours as needed for severe pain.  Marland Kitchen propranolol (INDERAL) 10 MG tablet TAKE 1 TABLET BY MOUTH EVERY DAY AS NEEDED     Allergies:   Shellfish allergy, Tea, Penicillins, and Tomato   Social History   Tobacco Use  . Smoking status: Former Smoker    Packs/day: 0.50    Types: Cigarettes    Quit date: 07/16/2017    Years since quitting: 2.3  . Smokeless tobacco: Never Used  . Tobacco comment: smokes 1cigarette per day and more if she is stressed  Substance Use Topics  . Alcohol use: No    Alcohol/week: 0.0 standard drinks  . Drug use: Yes    Types: Marijuana    Comment: occasional, for pain. states she hasnt  used for a mont; 08/02/17 sometimes     Family Hx: The patient's family history includes Heart disease in her father and mother; Hypertension in her father, mother, and another family member; Migraines in her daughter and son. There is no history of Colon cancer, Colon polyps, Diabetes, Kidney disease, Liver disease, Heart attack, or Stroke.  Review of Systems  Constitution: Negative for fever.  Respiratory: Negative for cough.   Endocrine: Positive for heat intolerance.  Gastrointestinal: Negative for diarrhea, hematochezia, melena and vomiting.  Genitourinary: Negative for hematuria.     EKGs/Labs/Other Test Reviewed:    EKG:  EKG is  ordered today.  The ekg ordered today demonstrates sinus bradycardia, heart rate 53, left axis deviation, septal Q waves, nonspecific ST-T wave changes, QTC 379, no change from prior EKGs  Recent Labs: 05/21/2019: BUN 7; Creatinine, Ser 0.52; Potassium 4.3; Sodium 140; TSH 1.180 06/12/2019: Hemoglobin 12.7; Platelets 249   Recent Lipid Panel Lab Results  Component Value Date/Time   CHOL 145 09/05/2013 02:59 PM   TRIG 76 09/05/2013 02:59 PM   HDL 46 09/05/2013 02:59 PM   CHOLHDL 3.2 09/05/2013 02:59 PM   LDLCALC 84 09/05/2013 02:59 PM    Physical Exam:    VS:  BP 110/78   Pulse (!) 53   Ht '5\' 5"'  (1.651 m)   Wt 160 lb (72.6 kg)   LMP 11/13/2016   SpO2 98%   BMI 26.63 kg/m     Wt Readings from Last 3 Encounters:  11/18/19 160 lb (72.6 kg)  06/18/19 159 lb 9.8 oz (72.4 kg)  05/21/19 160 lb 12.8 oz (72.9 kg)     Constitutional:      Appearance: Healthy appearance. Not in distress.  Neck:     Thyroid: Thyroid normal.     Vascular: JVD normal.  Pulmonary:     Effort: Pulmonary effort is normal.     Breath sounds: No wheezing. No rales.  Cardiovascular:     Normal rate. Regular rhythm. Normal S1. Normal S2.     Murmurs: There is no murmur.  Edema:    Peripheral edema absent.  Abdominal:     Palpations: Abdomen is soft. There is no  hepatomegaly.  Skin:    General: Skin is warm and dry.  Neurological:     General: No focal deficit present.     Mental Status: Alert and oriented to person, place and time.      ASSESSMENT & PLAN:    1. SVT (supraventricular tachycardia) ([place]) She continues to have frequent episodes of rapid palpitations.  These are happening almost daily  now.  She was somewhat concerned about keeping her appointment with Dr. Lovena Le in the past due to fears over COVID-19.  I have tried to reassure her today that precautions are taken in our office as well as the hospital to prevent infection.  Her blood pressure and heart rate do not allow for further titration of her beta-blocker.  I have tried to encourage her to see Dr. Lovena Le for possible ablation.  It has been a while since she had a monitor placed.  She has also noted increased symptoms related to asthma and uses albuterol.   -Obtain TSH  -Obtain 14-day ZIO AT monitor (rule out sinus tachycardia related to Albuterol)  -Refer back to Dr. Lovena Le with EP to consider RFA for SVT  2. Shortness of breath As noted, I suspect her shortness of breath is related to her asthma.  She sleeps on an incline chronically.  She does not appear to have any evidence of volume excess on exam.  Obtain BMET, BNP.  If BNP significantly elevated, obtain echocardiogram and start Lasix.  3. Intolerance to heat She notes intolerance to heat.  She is not tachycardic today.  However, given her history of SVT and rapid palpitations, I will obtain a follow-up TSH today.   Dispo:  Return for EP evaluation with Dr. Lovena Le for SVT.   Medication Adjustments/Labs and Tests Ordered: Current medicines are reviewed at length with the patient today.  Concerns regarding medicines are outlined above.  Tests Ordered: Orders Placed This Encounter  Procedures  . Basic metabolic panel  . TSH  . Pro b natriuretic peptide (BNP)  . Ambulatory referral to Cardiac Electrophysiology  . LONG  TERM MONITOR (3-14 DAYS)  . EKG 12-Lead   Medication Changes: No orders of the defined types were placed in this encounter.   Signed, Richardson Dopp, PA-C  11/18/2019 9:21 AM    [facility] [address], [place], [place]  [address] Phone: [phone]; Fax: [phone]

## 2019-11-18 ENCOUNTER — Telehealth: Payer: Self-pay | Admitting: Radiology

## 2019-11-18 ENCOUNTER — Ambulatory Visit: Payer: Medicaid Other | Admitting: Physician Assistant

## 2019-11-18 ENCOUNTER — Encounter: Payer: Self-pay | Admitting: Physician Assistant

## 2019-11-18 ENCOUNTER — Other Ambulatory Visit: Payer: Self-pay

## 2019-11-18 ENCOUNTER — Encounter (INDEPENDENT_AMBULATORY_CARE_PROVIDER_SITE_OTHER): Payer: Self-pay

## 2019-11-18 VITALS — BP 110/78 | HR 53 | Ht 65.0 in | Wt 160.0 lb

## 2019-11-18 DIAGNOSIS — R6889 Other general symptoms and signs: Secondary | ICD-10-CM

## 2019-11-18 DIAGNOSIS — R0602 Shortness of breath: Secondary | ICD-10-CM | POA: Diagnosis not present

## 2019-11-18 DIAGNOSIS — I471 Supraventricular tachycardia: Secondary | ICD-10-CM

## 2019-11-18 NOTE — Patient Instructions (Signed)
Medication Instructions:  Your physician recommends that you continue on your current medications as directed. Please refer to the Current Medication list given to you today.  *If you need a refill on your cardiac medications before your next appointment, please call your pharmacy*  Lab Work: TODAY:  BMET, TSH, & PRO BNP  If you have labs (blood work) drawn today and your tests are completely normal, you will receive your results only by: Marland Kitchen MyChart Message (if you have MyChart) OR . A paper copy in the mail If you have any lab test that is abnormal or we need to change your treatment, we will call you to review the results.   ZIO XT- Long Term Monitor Instructions   Your physician has requested you wear your ZIO patch monitor 14 days.   This is a single patch monitor.  [employer] supplies one patch monitor per enrollment.  Additional stickers are not available.   Please do not apply patch if you will be having a Nuclear Stress Test, Echocardiogram, Cardiac CT, MRI, or Chest Xray during the time frame you would be wearing the monitor. The patch cannot be worn during these tests.  You cannot remove and re-apply the ZIO XT patch monitor.   Your ZIO patch monitor will be sent USPS Priority mail from [facility] directly to your home address. The monitor may also be mailed to a PO BOX if home delivery is not available.   It may take 3-5 days to receive your monitor after you have been enrolled.   Once you have received you monitor, please review enclosed instructions.  Your monitor has already been registered assigning a specific monitor serial # to you.   Applying the monitor   Shave hair from upper left chest.   Hold abrader disc by orange tab.  Rub abrader in 40 strokes over left upper chest as indicated in your monitor instructions.   Clean area with 4 enclosed alcohol pads .  Use all pads to assure are is cleaned thoroughly.  Let dry.   Apply patch as indicated in monitor  instructions.  Patch will be place under collarbone on left side of chest with arrow pointing upward.   Rub patch adhesive wings for 2 minutes.Remove white label marked "1".  Remove white label marked "2".  Rub patch adhesive wings for 2 additional minutes.   While looking in a mirror, press and release button in center of patch.  A small green light will flash 3-4 times .  This will be your only indicator the monitor has been turned on.     Do not shower for the first 24 hours.  You may shower after the first 24 hours.   Press button if you feel a symptom. You will hear a small click.  Record Date, Time and Symptom in the Patient Log Book.   When you are ready to remove patch, follow instructions on last 2 pages of Patient Log Book.  Stick patch monitor onto last page of Patient Log Book.   Place Patient Log Book in [place] box.  Use locking tab on box and tape box closed securely.  The [employer] has [employer] on it.  Please place in mailbox as soon as possible.  Your physician should have your test results approximately 7 days after the monitor has been mailed back to [facility].   Call [facility] at [phone] if you have questions regarding your ZIO XT patch monitor.  Call  them immediately if you see an orange light blinking on your monitor.   If your monitor falls off in less than 4 days contact our Monitor department at [phone].  If your monitor becomes loose or falls off after 4 days call [employer] at [phone] for suggestions on securing your monitor.    Follow-Up: At [facility], you and your health needs are our priority.  As part of our continuing mission to provide you with exceptional heart care, we have created designated Provider Care Teams.  These Care Teams include your primary Cardiologist (physician) and Advanced Practice Providers (APPs -  Physician Assistants and Nurse Practitioners) who all work together to provide you with  the care you need, when you need it.  Your next appointment:   YOU NEED TO SEE DR. Lovena Le FOR SVT'S. SOMEONE WILL CALL YOU WITH THIS APPT.  The format for your next appointment:   In Person  Provider:   Cristopher Peru, MD  Other Instructions

## 2019-11-18 NOTE — Telephone Encounter (Signed)
Enrolled patient for a 13 day Zio AT Telemetry monitor to be mailed to patient home.

## 2019-11-19 ENCOUNTER — Other Ambulatory Visit: Payer: Self-pay | Admitting: Hematology and Oncology

## 2019-11-19 LAB — BASIC METABOLIC PANEL
BUN/Creatinine Ratio: 16 (ref 9–23)
BUN: 11 mg/dL (ref 6–24)
CO2: 21 mmol/L (ref 20–29)
Calcium: 9.6 mg/dL (ref 8.7–10.2)
Chloride: 101 mmol/L (ref 96–106)
Creatinine, Ser: 0.69 mg/dL (ref 0.57–1.00)
GFR calc Af Amer: 118 mL/min/{1.73_m2} (ref >59)
GFR calc non Af Amer: 103 mL/min/{1.73_m2} (ref >59)
Glucose: 94 mg/dL (ref 65–99)
Potassium: 3.6 mmol/L (ref 3.5–5.2)
Sodium: 139 mmol/L (ref 134–144)

## 2019-11-19 LAB — TSH: TSH: 0.501 u[IU]/mL (ref 0.450–4.500)

## 2019-11-19 LAB — PRO B NATRIURETIC PEPTIDE: NT-Pro BNP: 35 pg/mL (ref 0–249)

## 2019-11-20 ENCOUNTER — Ambulatory Visit (HOSPITAL_COMMUNITY)
Admission: RE | Admit: 2019-11-20 | Discharge: 2019-11-20 | Disposition: A | Discharge status: Home / Self Care | Payer: Medicaid Other | Source: Ambulatory Visit | Attending: Adult Health | Admitting: Adult Health

## 2019-11-20 ENCOUNTER — Inpatient Hospital Stay: Payer: Medicaid Other | Attending: Adult Health

## 2019-11-20 ENCOUNTER — Other Ambulatory Visit: Payer: Self-pay | Admitting: *Deleted

## 2019-11-20 ENCOUNTER — Other Ambulatory Visit: Payer: Self-pay

## 2019-11-20 ENCOUNTER — Inpatient Hospital Stay (HOSPITAL_BASED_OUTPATIENT_CLINIC_OR_DEPARTMENT_OTHER): Payer: Medicaid Other | Admitting: Adult Health

## 2019-11-20 ENCOUNTER — Telehealth: Payer: Self-pay | Admitting: *Deleted

## 2019-11-20 VITALS — BP 130/90 | HR 58 | Temp 98.2°F | Resp 18 | Ht 65.0 in | Wt 157.5 lb

## 2019-11-20 DIAGNOSIS — Z171 Estrogen receptor negative status [ER-]: Secondary | ICD-10-CM

## 2019-11-20 DIAGNOSIS — Z79899 Other long term (current) drug therapy: Secondary | ICD-10-CM | POA: Insufficient documentation

## 2019-11-20 DIAGNOSIS — Z87891 Personal history of nicotine dependence: Secondary | ICD-10-CM | POA: Insufficient documentation

## 2019-11-20 DIAGNOSIS — C50411 Malignant neoplasm of upper-outer quadrant of right female breast: Secondary | ICD-10-CM | POA: Diagnosis not present

## 2019-11-20 DIAGNOSIS — G8929 Other chronic pain: Secondary | ICD-10-CM

## 2019-11-20 DIAGNOSIS — Z923 Personal history of irradiation: Secondary | ICD-10-CM | POA: Diagnosis not present

## 2019-11-20 DIAGNOSIS — R1084 Generalized abdominal pain: Secondary | ICD-10-CM

## 2019-11-20 DIAGNOSIS — Z9221 Personal history of antineoplastic chemotherapy: Secondary | ICD-10-CM | POA: Insufficient documentation

## 2019-11-20 DIAGNOSIS — M797 Fibromyalgia: Secondary | ICD-10-CM | POA: Diagnosis not present

## 2019-11-20 DIAGNOSIS — K219 Gastro-esophageal reflux disease without esophagitis: Secondary | ICD-10-CM | POA: Diagnosis not present

## 2019-11-20 DIAGNOSIS — M545 Low back pain: Secondary | ICD-10-CM | POA: Diagnosis not present

## 2019-11-20 LAB — URINALYSIS, COMPLETE (UACMP) WITH MICROSCOPIC
Bilirubin Urine: NEGATIVE
Glucose, UA: NEGATIVE mg/dL
Hgb urine dipstick: NEGATIVE
Ketones, ur: NEGATIVE mg/dL
Nitrite: NEGATIVE
Protein, ur: NEGATIVE mg/dL
Specific Gravity, Urine: 1.026 (ref 1.005–1.030)
pH: 6 (ref 5.0–8.0)

## 2019-11-20 LAB — CBC WITH DIFFERENTIAL (CANCER CENTER ONLY)
Abs Immature Granulocytes: 0.03 10*3/uL (ref 0.00–0.07)
Basophils Absolute: 0 10*3/uL (ref 0.0–0.1)
Basophils Relative: 1 %
Eosinophils Absolute: 0.2 10*3/uL (ref 0.0–0.5)
Eosinophils Relative: 3 %
HCT: 41.2 % (ref 36.0–46.0)
Hemoglobin: 13.2 g/dL (ref 12.0–15.0)
Immature Granulocytes: 1 %
Lymphocytes Relative: 44 %
Lymphs Abs: 2.9 10*3/uL (ref 0.7–4.0)
MCH: 27.3 pg (ref 26.0–34.0)
MCHC: 32 g/dL (ref 30.0–36.0)
MCV: 85.3 fL (ref 80.0–100.0)
Monocytes Absolute: 0.4 10*3/uL (ref 0.1–1.0)
Monocytes Relative: 6 %
Neutro Abs: 2.8 10*3/uL (ref 1.7–7.7)
Neutrophils Relative %: 45 %
Platelet Count: 227 10*3/uL (ref 150–400)
RBC: 4.83 MIL/uL (ref 3.87–5.11)
RDW: 14.6 % (ref 11.5–15.5)
WBC Count: 6.3 10*3/uL (ref 4.0–10.5)
nRBC: 0 % (ref 0.0–0.2)

## 2019-11-20 LAB — CMP (CANCER CENTER ONLY)
ALT: 15 U/L (ref 0–44)
AST: 19 U/L (ref 15–41)
Albumin: 4.5 g/dL (ref 3.5–5.0)
Alkaline Phosphatase: 86 U/L (ref 38–126)
Anion gap: 11 (ref 5–15)
BUN: 11 mg/dL (ref 6–20)
CO2: 27 mmol/L (ref 22–32)
Calcium: 9.2 mg/dL (ref 8.9–10.3)
Chloride: 107 mmol/L (ref 98–111)
Creatinine: 0.74 mg/dL (ref 0.44–1.00)
GFR, Est AFR Am: >60 mL/min (ref >60)
GFR, Estimated: >60 mL/min (ref >60)
Glucose, Bld: 96 mg/dL (ref 70–99)
Potassium: 4.4 mmol/L (ref 3.5–5.1)
Sodium: 145 mmol/L (ref 135–145)
Total Bilirubin: 0.5 mg/dL (ref 0.3–1.2)
Total Protein: 7.7 g/dL (ref 6.5–8.1)

## 2019-11-20 MED ORDER — OXYCODONE-ACETAMINOPHEN 5-325 MG PO TABS: 1.0000 | ORAL_TABLET | Freq: Three times a day (TID) | ORAL | 0 refills | Status: DC | PRN

## 2019-11-20 MED ORDER — METHYLPREDNISOLONE SODIUM SUCC 40 MG IJ SOLR
INTRAMUSCULAR | Status: AC
  Filled 2019-11-20: qty 1

## 2019-11-20 MED ORDER — METHYLPREDNISOLONE SODIUM SUCC 40 MG IJ SOLR
40.0000 mg | Freq: Once | INTRAMUSCULAR | Status: AC
  Administered 2019-11-20: 15:00:00 40 mg via INTRAMUSCULAR

## 2019-11-20 NOTE — Telephone Encounter (Signed)
Received call from pt stating she is experincing severe abdominal and right lower back pain x2 days.  Pt states pain is a constant sharp/stabbing pain.  Per MD pt to be seen today by Wilber Bihari, NP for further management.

## 2019-11-20 NOTE — Telephone Encounter (Signed)
Refill request

## 2019-11-20 NOTE — Progress Notes (Signed)
Per Wilber Bihari, NP pt needing xray abdomen flat and upright, Dg thoracic spine 2 view, CBC, CMP, and UA.  Orders placed, apts scheduled, and pt notified.  Pt verbalized understanding on receiving x-rays at [facility] prior to being seen in [facility] today.

## 2019-11-20 NOTE — Progress Notes (Signed)
Katherine [name]:    Javier Docker, MD [address] [place] [address]   DIAGNOSIS: Cancer Staging Malignant neoplasm of upper-outer quadrant of right breast in female, estrogen receptor negative ([place]) Staging form: Breast, AJCC 8th Edition - Clinical: Stage IA (cT1c, cN0, cM0, G3, ER: Negative, PR: Negative, HER2: Positive) - Signed by Gardenia Phlegm, NP on 07/04/2017 - Pathologic: Stage IA (pT1c, pN0, cM0, G3, ER: Negative, PR: Negative, HER2: Positive) - Unsigned   SUMMARY OF ONCOLOGIC HISTORY: Oncology History  Malignant neoplasm of upper-outer quadrant of right breast in female, estrogen receptor negative ([place])  06/26/2017 Initial Diagnosis   Right breast biopsy 8:30 position 8 cm from nipple: IDC with DCIS, second biopsy 6 cm from nipple fibrocystic changes: Grade 3, ER 0%, PR 0%, Ki-67 40%, HER-2 positive ratio 3.59; mammogram and ultrasound revealed 1.9 cm mass at 8:30 position, adjacent 0.7 cm cystic mass, T1c N0 stage IA AJCC 8    07/24/2017 Surgery   Right lumpectomy: IDC grade 3, 1.8 cm, 0/2 lymph nodes negative,ER 0%, PR 0%, Ki-67 40%, HER-2 positive ratio 3.59, T1CN0 stage I a    09/10/2017 - 11/26/2017 Chemotherapy   Taxol Herceptin weekly x12 followed by Herceptin maintenance for 1 year   12/19/2017 - 01/15/2018 Radiation Therapy   Adjuvant radiation therapy     CURRENT THERAPY: Observation  INTERVAL HISTORY: Katherine [name] 49 y.o. female returns for evaluation of her increased abdominal and back pain.  This pain is new and it has been going on a x 3 days.  She cannot recall a precipitating event.  She says movement makes it worse.  She has tried Meloxicam and Ibuprofen and it hasn't worked for her.  The pain is also in her right lower abdomen.  She says her last bowel movement was this morning.  She says that she has been on Percocet before and it was given to her last on 08/2018 (per PMP aware review).  This is  different than her chronic back pain that she has experienced.     Patient Active Problem List   Diagnosis Date Noted  . Chemotherapy-induced peripheral neuropathy ([place]) 12/17/2017  . Port-A-Cath in place 09/24/2017  . Malignant neoplasm of upper-outer quadrant of right breast in female, estrogen receptor negative ([place]) 07/03/2017  . Pelvic pain in female 12/21/2016  . Fibroids, intramural 12/21/2016  . Post-operative state 12/19/2016  . Nocturia 08/09/2015  . Fibromyalgia 06/25/2015  . GERD (gastroesophageal reflux disease) 06/16/2013  . Migraine 06/16/2013  . Atrial tachycardia, paroxysmal ([place]) 03/31/2013  . Headache 12/20/2012  . Syncope 06/20/2012  . Fibroids 03/26/2012  . Family history of breast cancer 01/22/2012  . Seasonal and perennial allergic rhinitis 07/18/2010  . HAIR LOSS 07/04/2010  . ANXIETY DISORDER, SITUATIONAL, MILD 11/17/2009  . CHEST PAIN, LEFT 10/12/2009  . DYSURIA 08/26/2009  . MAMMOGRAM, ABNORMAL, RIGHT, HX OF 04/09/2009  . TOBACCO ABUSE 03/17/2009  . DECREASED HEARING, RIGHT EAR 10/27/2008  . BACK PAIN, LUMBAR 06/06/2007  . HOT FLASHES 07/18/2006    is allergic to shellfish allergy; tea; penicillins; and tomato.  MEDICAL HISTORY: Past Medical History:  Diagnosis Date  . Anxiety   . Asthma   . Dysrhythmia    History of SVT-No medications  . Fibromyalgia   . GERD (gastroesophageal reflux disease)   . History of radiation therapy 12/19/17- 01/15/18   Right Breast, 2.67 Gy in 15 fractions for a total dose of 40.05 Gy. Boost, 2  Gy in 5 fractions for a total dose of 10 Gy  . Hot flashes   . Malignant neoplasm of upper-outer quadrant of right female breast ([place]) 06/2017   right breast  . Migraine   . Palpitations   . Pneumonia    with cavitation of left lower lobe  . Tuberculosis    exposure as a child, get checked frequently  . Uterine fibroid     SURGICAL HISTORY: Past Surgical History:  Procedure Laterality Date  . ABDOMINAL HYSTERECTOMY     . BREAST LUMPECTOMY Right   . BREAST LUMPECTOMY WITH RADIOACTIVE SEED AND SENTINEL LYMPH NODE BIOPSY Right 07/24/2017   Procedure: RIGHT BREAST LUMPECTOMY WITH RADIOACTIVE SEED AND SENTINEL LYMPH NODE BIOPSY;  Surgeon: Erroll Luna, MD;  Location: [facility];  Service: General;  Laterality: Right;  . PORT-A-CATH REMOVAL Right 06/18/2019   Procedure: REMOVAL PORT-A-CATH;  Surgeon: Erroll Luna, MD;  Location: [facility];  Service: General;  Laterality: Right;  . PORTACATH PLACEMENT Right 07/24/2017   Procedure: INSERTION PORT-A-CATH;  Surgeon: Erroll Luna, MD;  Location: [facility];  Service: General;  Laterality: Right;  . TUBAL LIGATION  1993  . VAGINAL HYSTERECTOMY Bilateral 12/19/2016   Procedure: HYSTERECTOMY VAGINAL;  Surgeon: Lavonia Drafts, MD;  Location: [facility];  Service: Gynecology;  Laterality: Bilateral;  . WISDOM TOOTH EXTRACTION      SOCIAL HISTORY: Social History   Socioeconomic History  . Marital status: Married    Spouse name: Not on file  . Number of children: 2  . Years of education: 22  . Highest education level: Not on file  Occupational History  . Occupation: unemployed  Tobacco Use  . Smoking status: Former Smoker    Packs/day: 0.50    Types: Cigarettes    Quit date: 07/16/2017    Years since quitting: 2.3  . Smokeless tobacco: Never Used  . Tobacco comment: smokes 1cigarette per day and more if she is stressed  Substance and Sexual Activity  . Alcohol use: No    Alcohol/week: 0.0 standard drinks  . Drug use: Yes    Types: Marijuana    Comment: occasional, for pain. states she hasnt used for a mont; 08/02/17 sometimes  . Sexual activity: Yes    Birth control/protection: Surgical  Other Topics Concern  . Not on file  Social History Narrative   On disability for heart condition.  Pt. Was non-specific.       Patient does not drink caffeine.   Patient is ambidextrous, but mostly right  handed.    12th grade, some college courses   Social Determinants of Health   Financial Resource Strain:   . Difficulty of Paying Living Expenses: Not on file  Food Insecurity:   . Worried About Charity fundraiser in the Last Year: Not on file  . Ran Out of Food in the Last Year: Not on file  Transportation Needs:   . Lack of Transportation (Medical): Not on file  . Lack of Transportation (Non-Medical): Not on file  Physical Activity:   . Days of Exercise per Week: Not on file  . Minutes of Exercise per Session: Not on file  Stress:   . Feeling of Stress : Not on file  Social Connections:   . Frequency of Communication with Friends and Family: Not on file  . Frequency of Social Gatherings with Friends and Family: Not on file  . Attends Religious Services: Not on file  . Active Member of  Clubs or Organizations: Not on file  . Attends Archivist Meetings: Not on file  . Marital Status: Not on file  Intimate Partner Violence:   . Fear of Current or Ex-Partner: Not on file  . Emotionally Abused: Not on file  . Physically Abused: Not on file  . Sexually Abused: Not on file    FAMILY HISTORY: Family History  Problem Relation Age of Onset  . Migraines Daughter   . Hypertension Mother   . Heart disease Mother   . Hypertension Father   . Heart disease Father   . Migraines Son   . Hypertension Other   . Colon cancer Neg Hx   . Colon polyps Neg Hx   . Diabetes Neg Hx   . Kidney disease Neg Hx   . Liver disease Neg Hx   . Heart attack Neg Hx   . Stroke Neg Hx     Review of Systems  Constitutional: Negative for appetite change, chills, diaphoresis, fatigue, fever and unexpected weight change.  HENT:   Negative for hearing loss and lump/mass.   Eyes: Negative for eye problems and icterus.  Respiratory: Negative for chest tightness, cough and shortness of breath.   Cardiovascular: Negative for chest pain, leg swelling and palpitations.  Gastrointestinal: Positive  for abdominal pain. Negative for abdominal distention, blood in stool, constipation, diarrhea, nausea, rectal pain and vomiting.  Endocrine: Negative for hot flashes.  Musculoskeletal: Positive for back pain.  Neurological: Negative for dizziness, extremity weakness, headaches, light-headedness and numbness.  Hematological: Negative for adenopathy. Does not bruise/bleed easily.  Psychiatric/Behavioral: Negative for depression. The patient is not nervous/anxious.       PHYSICAL EXAMINATION  ECOG PERFORMANCE STATUS: 2 - Symptomatic, <50% confined to bed  Vitals:   11/20/19 1341  BP: 130/90  Pulse: (!) 58  Resp: 18  Temp: 98.2 F (36.8 C)  SpO2: 100%    Physical Exam Constitutional:      General: She is not in acute distress.    Appearance: Normal appearance. She is not toxic-appearing.  HENT:     Head: Normocephalic and atraumatic.  Eyes:     General: No scleral icterus.    Pupils: Pupils are equal, round, and reactive to light.  Cardiovascular:     Rate and Rhythm: Normal rate and regular rhythm.     Pulses: Normal pulses.     Heart sounds: Normal heart sounds.  Pulmonary:     Effort: Pulmonary effort is normal.     Breath sounds: Normal breath sounds.  Abdominal:     General: Abdomen is flat. Bowel sounds are normal. There is no distension.     Palpations: Abdomen is soft.     Tenderness: There is abdominal tenderness (tenderness in right lower quadrant). There is no rebound.  Musculoskeletal:     Cervical back: Neck supple.     Comments: Cannot actively raise right leg, +SLR of right leg at 10 degrees  Skin:    General: Skin is warm and dry.     Capillary Refill: Capillary refill takes less than 2 seconds.  Neurological:     General: No focal deficit present.     Mental Status: She is alert.  Psychiatric:        Mood and Affect: Mood normal.        Behavior: Behavior normal.     LABORATORY DATA:  CBC    Component Value Date/Time   WBC 6.3 11/20/2019  1324   WBC 5.1 06/12/2019 1105  RBC 4.83 11/20/2019 1324   HGB 13.2 11/20/2019 1324   HGB 11.5 (L) 09/24/2017 1024   HCT 41.2 11/20/2019 1324   HCT 34.5 (L) 09/24/2017 1024   PLT 227 11/20/2019 1324   PLT 202 09/24/2017 1024   MCV 85.3 11/20/2019 1324   MCV 81.6 09/24/2017 1024   MCH 27.3 11/20/2019 1324   MCHC 32.0 11/20/2019 1324   RDW 14.6 11/20/2019 1324   RDW 14.6 (H) 09/24/2017 1024   LYMPHSABS 2.9 11/20/2019 1324   LYMPHSABS 2.4 09/24/2017 1024   MONOABS 0.4 11/20/2019 1324   MONOABS 0.3 09/24/2017 1024   EOSABS 0.2 11/20/2019 1324   EOSABS 0.1 09/24/2017 1024   BASOSABS 0.0 11/20/2019 1324   BASOSABS 0.0 09/24/2017 1024    CMP     Component Value Date/Time   NA 145 11/20/2019 1324   NA 139 11/18/2019 0948   NA 140 09/24/2017 1023   K 4.4 11/20/2019 1324   K 3.8 09/24/2017 1023   CL 107 11/20/2019 1324   CO2 27 11/20/2019 1324   CO2 24 09/24/2017 1023   GLUCOSE 96 11/20/2019 1324   GLUCOSE 104 09/24/2017 1023   BUN 11 11/20/2019 1324   BUN 11 11/18/2019 0948   BUN 11.7 09/24/2017 1023   CREATININE 0.74 11/20/2019 1324   CREATININE 0.7 09/24/2017 1023   CALCIUM 9.2 11/20/2019 1324   CALCIUM 9.0 09/24/2017 1023   PROT 7.7 11/20/2019 1324   PROT 7.2 09/24/2017 1023   ALBUMIN 4.5 11/20/2019 1324   ALBUMIN 4.2 09/24/2017 1023   AST 19 11/20/2019 1324   AST 25 09/24/2017 1023   ALT 15 11/20/2019 1324   ALT 39 09/24/2017 1023   ALKPHOS 86 11/20/2019 1324   ALKPHOS 81 09/24/2017 1023   BILITOT 0.5 11/20/2019 1324   BILITOT 0.41 09/24/2017 1023   GFRNONAA >60 11/20/2019 1324   GFRNONAA >89 09/05/2013 1459   GFRAA >60 11/20/2019 1324   GFRAA >89 09/05/2013 1459          RADIOGRAPHIC STUDIES:  DG Thoracic Spine 2 View  Result Date: 11/20/2019 CLINICAL DATA:  Right sided back pain; no injury; generalized abd painabdominal pain EXAM: THORACIC SPINE 2 VIEWS COMPARISON:  None. FINDINGS: Normal alignment of the thoracic vertebral bodies. No loss of  vertebral body height or disc height. No subluxation. Normal paraspinal lines. IMPRESSION: Normal thoracic spine radiograph. Electronically Signed   By: Suzy Bouchard M.D.   On: 11/20/2019 13:01   DG Abd 2 Views  Result Date: 11/20/2019 CLINICAL DATA:  Abdominal pain.  Right-sided back pain.  No injury. EXAM: ABDOMEN - 2 VIEW COMPARISON:  CT 11/17/2017. FINDINGS: Soft tissue structures are unremarkable. No bowel distention. Stool noted throughout the colon. No free air. Pelvic calcifications consistent with phleboliths. No acute bony abnormality identified. Surgical clips right chest. IMPRESSION: No acute abnormality identified. Electronically Signed   By: [place]   On: 11/20/2019 13:02         ASSESSMENT and PLAN:   Malignant neoplasm of upper-outer quadrant of right breast in female, estrogen receptor negative ([place]) 07/24/2017:Right lumpectomy: IDC grade 3, 1.8 cm, 0/2 lymph nodes negative,ER 0%, PR 0%, Ki-67 40%, HER-2 positive ratio 3.59, T1CN0 stage I a  Recommendation: 1. Adjuvant chemotherapy with Taxol Herceptin completed 3/4/2019followed by Herceptin maintenance for 1 year 2. followed by adjuvant radiation therapy 12/19/2017 to 01/15/2018 ------------------------------------------------------------------------------------------------------------------------------------ Current treatment:Herceptin maintenance therapy. (Today is her last Herceptin treatment) Adjuvant radiation therapy. 12/19/2017 to 01/15/2018 Because she is ER PR negative there  is no role of adjuvant antiestrogen therapy.  New onset of abdominal/back pain.  It is unclear whether this pain is radiating anteroposteriorly, or vice versa.  Thoracic and abdominal plain films were unrevealing.  Will give patient a solumedrol injection IM, and get her scheduled for CT a/p.  #10 Percocet will be given.  This was reviewed with Dr. Lindi Adie in detail and he is in agreement with the plan.    She was recommended  to continue with the appropriate pandemic precautions.      Orders Placed This Encounter  Procedures  . CT Abdomen Pelvis W Contrast    Standing Status:   Future    Standing Expiration Date:   11/19/2020    Order Specific Question:   If indicated for the ordered procedure, I authorize the administration of contrast media per Radiology protocol    Answer:   Yes    Order Specific Question:   Is patient pregnant?    Answer:   No    Order Specific Question:   Preferred imaging location?    Answer:   [facility]    Order Specific Question:   Release to patient    Answer:   Immediate    Order Specific Question:   Is Oral Contrast requested for this exam?    Answer:   Yes, Per Radiology protocol    Order Specific Question:   Radiology Contrast Protocol - do NOT remove file path    Answer:   [email or web address]    All questions were answered. The patient knows to call the clinic with any problems, questions or concerns. We can certainly see the patient much sooner if necessary.  Total encounter time: 30 minutes*  Wilber Bihari, NP 11/20/19 2:48 PM Medical Oncology and Hematology [facility] [place], [place] [address] Tel. [phone]    Fax. [phone]  *Total Encounter Time as defined by the Centers for Medicare and Medicaid Services includes, in addition to the face-to-face time of a patient visit (documented in the note above) non-face-to-face time: obtaining and reviewing outside history, ordering and reviewing medications, tests or procedures, care coordination (communications with other health care professionals or caregivers) and documentation in the medical record.

## 2019-11-20 NOTE — Telephone Encounter (Signed)
Pt requested refill.  You are seeing her today in clinic if you could please decide if she needs refill or not.  Thanks

## 2019-11-20 NOTE — Assessment & Plan Note (Signed)
07/24/2017:Right lumpectomy: IDC grade 3, 1.8 cm, 0/2 lymph nodes negative,ER 0%, PR 0%, Ki-67 40%, HER-2 positive ratio 3.59, T1CN0 stage I a  Recommendation: 1. Adjuvant chemotherapy with Taxol Herceptin completed 3/4/2019followed by Herceptin maintenance for 1 year 2. followed by adjuvant radiation therapy 12/19/2017 to 01/15/2018 ------------------------------------------------------------------------------------------------------------------------------------ Current treatment:Herceptin maintenance therapy. (Today is her last Herceptin treatment) Adjuvant radiation therapy. 12/19/2017 to 01/15/2018 Because she is ER PR negative there is no role of adjuvant antiestrogen therapy.  New onset of abdominal/back pain.  It is unclear whether this pain is radiating anteroposteriorly, or vice versa.  Thoracic and abdominal plain films were unrevealing.  Will give patient a solumedrol injection IM, and get her scheduled for CT a/p.  #10 Percocet will be given.  This was reviewed with Dr. Lindi Adie in detail and he is in agreement with the plan.    She was recommended to continue with the appropriate pandemic precautions.

## 2019-11-22 ENCOUNTER — Encounter (INDEPENDENT_AMBULATORY_CARE_PROVIDER_SITE_OTHER): Payer: Medicaid Other

## 2019-11-22 DIAGNOSIS — I471 Supraventricular tachycardia: Secondary | ICD-10-CM

## 2019-11-24 ENCOUNTER — Telehealth: Payer: Self-pay | Admitting: Internal Medicine

## 2019-11-24 ENCOUNTER — Encounter: Payer: Self-pay | Admitting: *Deleted

## 2019-11-24 NOTE — Progress Notes (Signed)
Patient ID: Katherine [name], female   DOB: Mar 14, 1971, 49 y.o.   MRN: [id number] Call received from [facility] [employer].  Patient contacted [employer] .  ZlO AT monitor came off during the night.  Patient stated she has allergic reaction to patch.  Monitor has approximately 3 days of data.  Patient instructed to mail patch back to [facility] for processing.  They will not be sending the patient a replacement monitor due to her allergic reaction to the [employer] ZIO patch product.  No alternative available.

## 2019-11-24 NOTE — Progress Notes (Signed)
Ok.  Please make sure she gets an appointment to see Dr. Lovena Le for SVT.  I referred her at last OV. Richardson Dopp, PA-C    11/24/2019 2:04 PM

## 2019-11-24 NOTE — Telephone Encounter (Signed)
Patient is scheduled with Dr. Lovena Le for SVT on 4/7

## 2019-11-24 NOTE — Telephone Encounter (Signed)
New message   Pt states that her monitor fell off and the monitor company told her to mail it back. She said she would put it in the mail in the morning. She also states that she has a rash.

## 2019-11-28 ENCOUNTER — Other Ambulatory Visit: Payer: Self-pay

## 2019-11-28 ENCOUNTER — Ambulatory Visit (HOSPITAL_COMMUNITY)
Admission: RE | Admit: 2019-11-28 | Discharge: 2019-11-28 | Disposition: A | Discharge status: Home / Self Care | Payer: Medicaid Other | Source: Ambulatory Visit | Attending: Adult Health | Admitting: Adult Health

## 2019-11-28 DIAGNOSIS — Z171 Estrogen receptor negative status [ER-]: Secondary | ICD-10-CM | POA: Insufficient documentation

## 2019-11-28 DIAGNOSIS — M545 Low back pain, unspecified: Secondary | ICD-10-CM

## 2019-11-28 DIAGNOSIS — G8929 Other chronic pain: Secondary | ICD-10-CM | POA: Insufficient documentation

## 2019-11-28 DIAGNOSIS — C50411 Malignant neoplasm of upper-outer quadrant of right female breast: Secondary | ICD-10-CM | POA: Diagnosis not present

## 2019-11-28 MED ORDER — SODIUM CHLORIDE (PF) 0.9 % IJ SOLN
INTRAMUSCULAR | Status: AC
  Filled 2019-11-28: qty 50

## 2019-11-28 MED ORDER — IOHEXOL 300 MG/ML  SOLN
100.0000 mL | Freq: Once | INTRAMUSCULAR | Status: AC | PRN
  Administered 2019-11-28: 100 mL via INTRAVENOUS

## 2019-12-02 ENCOUNTER — Telehealth: Payer: Self-pay | Admitting: *Deleted

## 2019-12-02 NOTE — Telephone Encounter (Signed)
Per Wilber Bihari, NP, made several attempts to reach pt to make aware scans returned normal. Voice message was left on personal voice. Advised if there were any or questions or concerns to call office.

## 2019-12-04 ENCOUNTER — Other Ambulatory Visit: Payer: Self-pay

## 2019-12-04 ENCOUNTER — Encounter: Payer: Self-pay | Admitting: Physician Assistant

## 2019-12-04 ENCOUNTER — Emergency Department (HOSPITAL_COMMUNITY)
Admission: EM | Admit: 2019-12-04 | Discharge: 2019-12-04 | Disposition: A | Discharge status: Home / Self Care | Payer: Medicaid Other | Attending: Emergency Medicine | Admitting: Emergency Medicine

## 2019-12-04 DIAGNOSIS — Z87891 Personal history of nicotine dependence: Secondary | ICD-10-CM | POA: Diagnosis not present

## 2019-12-04 DIAGNOSIS — R112 Nausea with vomiting, unspecified: Secondary | ICD-10-CM | POA: Insufficient documentation

## 2019-12-04 DIAGNOSIS — M62838 Other muscle spasm: Secondary | ICD-10-CM | POA: Insufficient documentation

## 2019-12-04 DIAGNOSIS — Z853 Personal history of malignant neoplasm of breast: Secondary | ICD-10-CM | POA: Insufficient documentation

## 2019-12-04 DIAGNOSIS — R197 Diarrhea, unspecified: Secondary | ICD-10-CM | POA: Insufficient documentation

## 2019-12-04 DIAGNOSIS — R1084 Generalized abdominal pain: Secondary | ICD-10-CM | POA: Insufficient documentation

## 2019-12-04 LAB — COMPREHENSIVE METABOLIC PANEL
ALT: 25 U/L (ref 0–44)
AST: 23 U/L (ref 15–41)
Albumin: 4.5 g/dL (ref 3.5–5.0)
Alkaline Phosphatase: 78 U/L (ref 38–126)
Anion gap: 12 (ref 5–15)
BUN: 9 mg/dL (ref 6–20)
CO2: 23 mmol/L (ref 22–32)
Calcium: 9.6 mg/dL (ref 8.9–10.3)
Chloride: 107 mmol/L (ref 98–111)
Creatinine, Ser: 0.68 mg/dL (ref 0.44–1.00)
GFR calc Af Amer: >60 mL/min (ref >60)
GFR calc non Af Amer: >60 mL/min (ref >60)
Glucose, Bld: 105 mg/dL — ABNORMAL HIGH (ref 70–99)
Potassium: 3.9 mmol/L (ref 3.5–5.1)
Sodium: 142 mmol/L (ref 135–145)
Total Bilirubin: 0.6 mg/dL (ref 0.3–1.2)
Total Protein: 7.6 g/dL (ref 6.5–8.1)

## 2019-12-04 LAB — CBC
HCT: 44 % (ref 36.0–46.0)
Hemoglobin: 14.1 g/dL (ref 12.0–15.0)
MCH: 27.4 pg (ref 26.0–34.0)
MCHC: 32 g/dL (ref 30.0–36.0)
MCV: 85.6 fL (ref 80.0–100.0)
Platelets: 203 10*3/uL (ref 150–400)
RBC: 5.14 MIL/uL — ABNORMAL HIGH (ref 3.87–5.11)
RDW: 14.8 % (ref 11.5–15.5)
WBC: 8 10*3/uL (ref 4.0–10.5)
nRBC: 0 % (ref 0.0–0.2)

## 2019-12-04 LAB — URINALYSIS, ROUTINE W REFLEX MICROSCOPIC
Bilirubin Urine: NEGATIVE
Glucose, UA: NEGATIVE mg/dL
Ketones, ur: 20 mg/dL — AB
Leukocytes,Ua: NEGATIVE
Nitrite: NEGATIVE
Protein, ur: 30 mg/dL — AB
Specific Gravity, Urine: 1.028 (ref 1.005–1.030)
pH: 5 (ref 5.0–8.0)

## 2019-12-04 LAB — I-STAT BETA HCG BLOOD, ED (MC, WL, AP ONLY): I-stat hCG, quantitative: <5 m[IU]/mL (ref <5)

## 2019-12-04 LAB — LIPASE, BLOOD: Lipase: 19 U/L (ref 11–51)

## 2019-12-04 MED ORDER — SODIUM CHLORIDE 0.9 % IV BOLUS
1000.0000 mL | Freq: Once | INTRAVENOUS | Status: AC
  Administered 2019-12-04: 1000 mL via INTRAVENOUS

## 2019-12-04 MED ORDER — SODIUM CHLORIDE 0.9% FLUSH: 3.0000 mL | Freq: Once | INTRAVENOUS | Status: DC

## 2019-12-04 MED ORDER — METHOCARBAMOL 500 MG PO TABS
500.0000 mg | ORAL_TABLET | Freq: Once | ORAL | Status: AC
  Administered 2019-12-04: 500 mg via ORAL
  Filled 2019-12-04: qty 1

## 2019-12-04 MED ORDER — ONDANSETRON HCL 4 MG/2ML IJ SOLN
4.0000 mg | Freq: Once | INTRAMUSCULAR | Status: AC
  Administered 2019-12-04: 4 mg via INTRAVENOUS
  Filled 2019-12-04: qty 2

## 2019-12-04 MED ORDER — MORPHINE SULFATE (PF) 4 MG/ML IV SOLN
4.0000 mg | Freq: Once | INTRAVENOUS | Status: AC
  Administered 2019-12-04: 4 mg via INTRAVENOUS
  Filled 2019-12-04: qty 1

## 2019-12-04 MED ORDER — ONDANSETRON 4 MG PO TBDP: 4.0000 mg | ORAL_TABLET | Freq: Three times a day (TID) | ORAL | 0 refills | Status: DC | PRN

## 2019-12-04 MED ORDER — METHOCARBAMOL 500 MG PO TABS: 500.0000 mg | ORAL_TABLET | Freq: Two times a day (BID) | ORAL | 0 refills | Status: DC

## 2019-12-04 NOTE — ED Provider Notes (Signed)
[facility] EMERGENCY DEPARTMENT Provider Note   CSN: [id number] Arrival date & time: 12/04/19  1500     History Chief Complaint  Patient presents with  . Emesis  . Abdominal Pain  . Diarrhea    Katherine [name] is a 49 y.o. female with a past medical history of fibromyalgia, GERD, breast cancer in remission for the past year, migraines presenting to the ED with a chief complaint of abdominal pain and vomiting.  Since last night started having cramping/"gurgling" abdominal pain, several episodes of nonbloody, nonbilious emesis and diarrhea that is nonbloody in nature.  She reports numerous episodes of these since yesterday.  She did drink a small amount of alcohol but does not believe this caused her symptoms as she does not drink daily.  No sick contacts with similar symptoms, lives with her husband who is asymptomatic.  She has not tried any medications to help with her symptoms.  Reports of vomiting anytime she attempts to eat or drink anything.  Denies prior abdominal surgeries, urinary symptoms, fever, back pain, shortness of breath, chest pain injuries or falls. She is concerned that she is dehydrated as she notes a dry mouth.  HPI     Past Medical History:  Diagnosis Date  . Anxiety   . Asthma   . Dysrhythmia    History of SVT-No medications  . Fibromyalgia   . GERD (gastroesophageal reflux disease)   . History of radiation therapy 12/19/17- 01/15/18   Right Breast, 2.67 Gy in 15 fractions for a total dose of 40.05 Gy. Boost, 2 Gy in 5 fractions for a total dose of 10 Gy  . Hot flashes   . Malignant neoplasm of upper-outer quadrant of right female breast ([place]) 06/2017   right breast  . Migraine   . Palpitations   . Pneumonia    with cavitation of left lower lobe  . SVT (supraventricular tachycardia) ([place])    Zio Monitor 11/2019: Patient only wore for 3 day (adhesive allergy); NSR, Avg HR 65, no arrhythmia  . Tuberculosis    exposure as a child, get checked  frequently  . Uterine fibroid     Patient Active Problem List   Diagnosis Date Noted  . Chemotherapy-induced peripheral neuropathy ([place]) 12/17/2017  . Port-A-Cath in place 09/24/2017  . Malignant neoplasm of upper-outer quadrant of right breast in female, estrogen receptor negative ([place]) 07/03/2017  . Pelvic pain in female 12/21/2016  . Fibroids, intramural 12/21/2016  . Post-operative state 12/19/2016  . Nocturia 08/09/2015  . Fibromyalgia 06/25/2015  . GERD (gastroesophageal reflux disease) 06/16/2013  . Migraine 06/16/2013  . Atrial tachycardia, paroxysmal ([place]) 03/31/2013  . Headache 12/20/2012  . Syncope 06/20/2012  . Fibroids 03/26/2012  . Family history of breast cancer 01/22/2012  . Seasonal and perennial allergic rhinitis 07/18/2010  . HAIR LOSS 07/04/2010  . ANXIETY DISORDER, SITUATIONAL, MILD 11/17/2009  . CHEST PAIN, LEFT 10/12/2009  . DYSURIA 08/26/2009  . MAMMOGRAM, ABNORMAL, RIGHT, HX OF 04/09/2009  . TOBACCO ABUSE 03/17/2009  . DECREASED HEARING, RIGHT EAR 10/27/2008  . BACK PAIN, LUMBAR 06/06/2007  . HOT FLASHES 07/18/2006    Past Surgical History:  Procedure Laterality Date  . ABDOMINAL HYSTERECTOMY    . BREAST LUMPECTOMY Right   . BREAST LUMPECTOMY WITH RADIOACTIVE SEED AND SENTINEL LYMPH NODE BIOPSY Right 07/24/2017   Procedure: RIGHT BREAST LUMPECTOMY WITH RADIOACTIVE SEED AND SENTINEL LYMPH NODE BIOPSY;  Surgeon: Erroll Luna, MD;  Location: [facility];  Service: General;  Laterality: Right;  . PORT-A-CATH REMOVAL Right 06/18/2019   Procedure: REMOVAL PORT-A-CATH;  Surgeon: Erroll Luna, MD;  Location: [facility];  Service: General;  Laterality: Right;  . PORTACATH PLACEMENT Right 07/24/2017   Procedure: INSERTION PORT-A-CATH;  Surgeon: Erroll Luna, MD;  Location: [facility];  Service: General;  Laterality: Right;  . TUBAL LIGATION  1993  . VAGINAL HYSTERECTOMY Bilateral 12/19/2016   Procedure:  HYSTERECTOMY VAGINAL;  Surgeon: Lavonia Drafts, MD;  Location: [facility];  Service: Gynecology;  Laterality: Bilateral;  . WISDOM TOOTH EXTRACTION       OB History    Gravida  2   Para  2   Term  1   Preterm  1   AB      Living  2     SAB      TAB      Ectopic      Multiple      Live Births              Family History  Problem Relation Age of Onset  . Migraines Daughter   . Hypertension Mother   . Heart disease Mother   . Hypertension Father   . Heart disease Father   . Migraines Son   . Hypertension Other   . Colon cancer Neg Hx   . Colon polyps Neg Hx   . Diabetes Neg Hx   . Kidney disease Neg Hx   . Liver disease Neg Hx   . Heart attack Neg Hx   . Stroke Neg Hx     Social History   Tobacco Use  . Smoking status: Former Smoker    Packs/day: 0.50    Types: Cigarettes    Quit date: 07/16/2017    Years since quitting: 2.3  . Smokeless tobacco: Never Used  . Tobacco comment: smokes 1cigarette per day and more if she is stressed  Substance Use Topics  . Alcohol use: No    Alcohol/week: 0.0 standard drinks  . Drug use: Yes    Types: Marijuana    Comment: occasional, for pain. states she hasnt used for a mont; 08/02/17 sometimes    Home Medications Prior to Admission medications   Medication Sig Start Date End Date Taking? Authorizing Provider  albuterol (PROVENTIL HFA;VENTOLIN HFA) 108 (90 Base) MCG/ACT inhaler Inhale 2 puffs into the lungs every 6 (six) hours as needed for wheezing or shortness of breath.   Yes [provider]  DULoxetine (CYMBALTA) 30 MG capsule Take 30 mg by mouth daily.   Yes [provider]  esomeprazole (NEXIUM) 40 MG capsule Take 40 mg by mouth daily before breakfast.  04/28/19  Yes [provider]  meloxicam (MOBIC) 7.5 MG tablet Take 7.5 mg by mouth daily.  03/20/19  Yes [provider]  metoprolol succinate (TOPROL-XL) 25 MG 24 hr tablet TAKE 1 TABLET BY MOUTH EVERY DAY Patient  taking differently: Take 25 mg by mouth daily.  11/17/19  Yes Fay Records, MD  propranolol (INDERAL) 10 MG tablet TAKE 1 TABLET BY MOUTH EVERY DAY AS NEEDED Patient taking differently: Take 10 mg by mouth daily as needed (chest pain, afib).  08/25/19  Yes Weaver, Scott T, PA-C  SYMBICORT 80-4.5 MCG/ACT inhaler Inhale 2 puffs into the lungs 3 (three) times daily. 11/14/19  Yes [provider]  ibuprofen (ADVIL,MOTRIN) 600 MG tablet Take 1 tablet (600 mg total) by mouth every 6 (six) hours as needed. Patient not taking: Reported on  12/04/2019 11/17/17   Noemi Chapel, MD  methocarbamol (ROBAXIN) 500 MG tablet Take 1 tablet (500 mg total) by mouth 2 (two) times daily. 12/04/19   Eliyanna Ault, PA-C  ondansetron (ZOFRAN ODT) 4 MG disintegrating tablet Take 1 tablet (4 mg total) by mouth every 8 (eight) hours as needed for nausea or vomiting. 12/04/19   Marcella Dunnaway, PA-C  oxyCODONE-acetaminophen (PERCOCET/ROXICET) 5-325 MG tablet Take 1 tablet by mouth every 8 (eight) hours as needed for severe pain. Patient not taking: Reported on 12/04/2019 11/20/19   Gardenia Phlegm, NP    Allergies    Shellfish allergy, Tea, Penicillins, and Tomato  Review of Systems   Review of Systems  Constitutional: Negative for appetite change, chills and fever.  HENT: Negative for ear pain, rhinorrhea, sneezing and sore throat.   Eyes: Negative for photophobia and visual disturbance.  Respiratory: Negative for cough, chest tightness, shortness of breath and wheezing.   Cardiovascular: Negative for chest pain and palpitations.  Gastrointestinal: Positive for abdominal pain, diarrhea, nausea and vomiting. Negative for blood in stool and constipation.  Genitourinary: Negative for dysuria, hematuria and urgency.  Musculoskeletal: Negative for myalgias.  Skin: Negative for rash.  Neurological: Negative for dizziness, weakness and light-headedness.    Physical Exam Updated Vital Signs BP 126/77   Pulse (!)  53   Temp 98 F (36.7 C) (Oral)   Resp 16   LMP 11/13/2016   SpO2 100%   Physical Exam Vitals and nursing note reviewed.  Constitutional:      General: She is not in acute distress.    Appearance: She is well-developed.  HENT:     Head: Normocephalic and atraumatic.     Nose: Nose normal.  Eyes:     General: No scleral icterus.       Left eye: No discharge.     Conjunctiva/sclera: Conjunctivae normal.  Cardiovascular:     Rate and Rhythm: Normal rate and regular rhythm.     Heart sounds: Normal heart sounds. No murmur. No friction rub. No gallop.   Pulmonary:     Effort: Pulmonary effort is normal. No respiratory distress.     Breath sounds: Normal breath sounds.  Abdominal:     General: Bowel sounds are normal. There is no distension.     Palpations: Abdomen is soft.     Tenderness: There is no abdominal tenderness. There is no guarding.  Musculoskeletal:        General: Normal range of motion.     Cervical back: Normal range of motion and neck supple.  Skin:    General: Skin is warm and dry.     Findings: No rash.  Neurological:     Mental Status: She is alert.     Motor: No abnormal muscle tone.     Coordination: Coordination normal.     ED Results / Procedures / Treatments   Labs (all labs ordered are listed, but only abnormal results are displayed) Labs Reviewed  COMPREHENSIVE METABOLIC PANEL - Abnormal; Notable for the following components:      Result Value   Glucose, Bld 105 (*)    All other components within normal limits  CBC - Abnormal; Notable for the following components:   RBC 5.14 (*)    All other components within normal limits  URINALYSIS, ROUTINE W REFLEX MICROSCOPIC - Abnormal; Notable for the following components:   APPearance HAZY (*)    Hgb urine dipstick SMALL (*)    Ketones, ur 20 (*)  Protein, ur 30 (*)    Bacteria, UA RARE (*)    All other components within normal limits  LIPASE, BLOOD  I-STAT BETA HCG BLOOD, ED (MC, WL, AP  ONLY)    EKG None  Radiology LONG TERM MONITOR-LIVE TELEMETRY (3-14 DAYS)  Result Date: 12/03/2019 Sinus rhythm  47 t0 117 bpm   AVerage HR 65 bpm No arrhythmias detected   Procedures Procedures (including critical care time)  Medications Ordered in ED Medications  sodium chloride flush (NS) 0.9 % injection 3 mL (3 mLs Intravenous Not Given 12/04/19 1615)  sodium chloride 0.9 % bolus 1,000 mL (0 mLs Intravenous Stopped 12/04/19 1809)  ondansetron (ZOFRAN) injection 4 mg (4 mg Intravenous Given 12/04/19 1659)  morphine 4 MG/ML injection 4 mg (4 mg Intravenous Given 12/04/19 1659)  methocarbamol (ROBAXIN) tablet 500 mg (500 mg Oral Given 12/04/19 1951)    ED Course  I have reviewed the triage vital signs and the nursing notes.  Pertinent labs & imaging results that were available during my care of the patient were reviewed by me and considered in my medical decision making (see chart for details).    MDM Rules/Calculators/A&P                      49 year old female with a past medical history of fibromyalgia, GERD presenting to the ED with a chief complaint of abdominal pain and vomiting.  Reports cramping abdominal pain several episodes of emesis and diarrhea that began yesterday.  No sick contacts with similar symptoms.  Did drink a small amount of alcohol prior to symptom onset but she is unsure if this caused her symptoms.  On exam abdomen is soft, nontender nondistended.  No CVA tenderness noted.  She is afebrile without recent use of antipyretics.  Lab work here significant for unremarkable CMP, CBC, urinalysis with some ketones, rare bacteria but patient denies any symptoms.  Lipase is unremarkable.  hCG is negative.  Patient was given IV fluids here, antiemetics and pain medication with resolution of her symptoms.  She was also given Robaxin as she reports history of chronic muscle spasms.  She is able to tolerate p.o. intake without difficulty.  She is requesting discharge home  with antiemetics and muscle relaxer.  Feel this is reasonable as her symptoms are most likely viral in nature.  Doubt appendicitis, cholecystitis, pyelonephritis or other emergent cause of her symptoms.  We will have her follow-up with her PCP and return for worsening symptoms.  Patient is hemodynamically stable, in NAD, and able to ambulate in the ED. Evaluation does not show pathology that would require ongoing emergent intervention or inpatient treatment. I have personally reviewed and interpreted all lab work and imaging at today's ED visit. I explained the diagnosis to the patient. Pain has been managed and has no complaints prior to discharge. Patient is comfortable with above plan and is stable for discharge at this time. All questions were answered prior to disposition. Strict return precautions for returning to the ED were discussed. Encouraged follow up with PCP.   An After Visit Summary was printed and given to the patient.   Portions of this note were generated with [employer]. Dictation errors may occur despite best attempts at proofreading.  Final Clinical Impression(s) / ED Diagnoses Final diagnoses:  Nausea vomiting and diarrhea  Muscle spasm    Rx / DC Orders ED Discharge Orders         Ordered    methocarbamol (  ROBAXIN) 500 MG tablet  2 times daily     12/04/19 2023    ondansetron (ZOFRAN ODT) 4 MG disintegrating tablet  Every 8 hours PRN     12/04/19 2023           Delia Heady, PA-C 12/04/19 2025    Lucrezia Starch, MD 12/05/19 1529

## 2019-12-04 NOTE — ED Triage Notes (Signed)
Pt here for evaluation of generalized abdominal pain, vomiting, and diarrhea since last night. Unable to tolerate PO intake. Made appointment with PCP tomorrow but couldn't make it until then.

## 2019-12-04 NOTE — ED Notes (Signed)
Gave pt chapstick, crackers, ice and gingerale. Will reassess and see how pt tolerates PO.

## 2019-12-04 NOTE — Discharge Instructions (Signed)
Take the Zofran as needed for nausea. Return to the ED if you start to develop worsening symptoms, develop worsening abdominal pain, inability to tolerate anything by mouth, bloody stools or lightheadedness.

## 2019-12-04 NOTE — Progress Notes (Signed)
Patient Care Team: Pavelock, Ralene Bathe, MD as PCP - General (Internal Medicine) Fay Records, MD as PCP - Cardiology (Cardiology) Erroll Luna, MD as Consulting Physician (General Surgery) Nicholas Lose, MD as Consulting Physician (Hematology and Oncology) Eppie Gibson, MD as Attending Physician (Radiation Oncology)  DIAGNOSIS:    ICD-10-CM   1. Malignant neoplasm of upper-outer quadrant of right breast in female, estrogen receptor negative ([place])  C50.411    Z17.1     SUMMARY OF ONCOLOGIC HISTORY: Oncology History  Malignant neoplasm of upper-outer quadrant of right breast in female, estrogen receptor negative ([place])  06/26/2017 Initial Diagnosis   Right breast biopsy 8:30 position 8 cm from nipple: IDC with DCIS, second biopsy 6 cm from nipple fibrocystic changes: Grade 3, ER 0%, PR 0%, Ki-67 40%, HER-2 positive ratio 3.59; mammogram and ultrasound revealed 1.9 cm mass at 8:30 position, adjacent 0.7 cm cystic mass, T1c N0 stage IA AJCC 8    07/24/2017 Surgery   Right lumpectomy: IDC grade 3, 1.8 cm, 0/2 lymph nodes negative,ER 0%, PR 0%, Ki-67 40%, HER-2 positive ratio 3.59, T1CN0 stage I a    09/10/2017 - 11/26/2017 Chemotherapy   Taxol Herceptin weekly x12 followed by Herceptin maintenance for 1 year   12/19/2017 - 01/15/2018 Radiation Therapy   Adjuvant radiation therapy     CHIEF COMPLIANT: Recent emergency room visit for nausea vomiting, complaining of diffuse fibromyalgia related pain and right hip pain  INTERVAL HISTORY: Katherine [name] is a 48 y.o. with above-mentioned history of right breast cancer treated with lumpectomy, adjuvant chemotherapy, radiation, Herceptin maintenance and who is currently on surveillance. Mammogram on 12/24/18 showed no evidence of malignancy bilaterally.  She saw Mendel Ryder recently for severe pain in the hips and abdominal area and CT of the abdomen and pelvis was performed.  It did not show any evidence of metastatic disease.  Since the past 24 to  48 hours she has been having nausea and vomiting and went to the emergency room.  She was discharged home.  She is here today for follow-up to discuss results of scans and to figure out what to do about her pains. She has not seen her primary care physician.  She tells me that she did not pick up the pain prescription that was provided by Mendel Ryder last month.  ALLERGIES:  is allergic to shellfish allergy; tea; penicillins; and tomato.  MEDICATIONS:  Current Outpatient Medications  Medication Sig Dispense Refill  . albuterol (PROVENTIL HFA;VENTOLIN HFA) 108 (90 Base) MCG/ACT inhaler Inhale 2 puffs into the lungs every 6 (six) hours as needed for wheezing or shortness of breath.    . DULoxetine (CYMBALTA) 30 MG capsule Take 30 mg by mouth daily.    Marland Kitchen esomeprazole (NEXIUM) 40 MG capsule Take 40 mg by mouth daily before breakfast.     . ibuprofen (ADVIL,MOTRIN) 600 MG tablet Take 1 tablet (600 mg total) by mouth every 6 (six) hours as needed. (Patient not taking: Reported on 12/04/2019) 30 tablet 0  . meloxicam (MOBIC) 7.5 MG tablet Take 7.5 mg by mouth daily.     . methocarbamol (ROBAXIN) 500 MG tablet Take 1 tablet (500 mg total) by mouth 2 (two) times daily. 20 tablet 0  . metoprolol succinate (TOPROL-XL) 25 MG 24 hr tablet TAKE 1 TABLET BY MOUTH EVERY DAY (Patient taking differently: Take 25 mg by mouth daily. ) 30 tablet 6  . ondansetron (ZOFRAN ODT) 4 MG disintegrating tablet Take 1 tablet (4 mg total) by mouth every  8 (eight) hours as needed for nausea or vomiting. 4 tablet 0  . oxyCODONE-acetaminophen (PERCOCET/ROXICET) 5-325 MG tablet Take 1 tablet by mouth every 8 (eight) hours as needed for severe pain. (Patient not taking: Reported on 12/04/2019) 30 tablet 0  . propranolol (INDERAL) 10 MG tablet TAKE 1 TABLET BY MOUTH EVERY DAY AS NEEDED (Patient taking differently: Take 10 mg by mouth daily as needed (chest pain, afib). ) 20 tablet 2  . SYMBICORT 80-4.5 MCG/ACT inhaler Inhale 2 puffs into the  lungs 3 (three) times daily.     No current facility-administered medications for this visit.    PHYSICAL EXAMINATION: ECOG PERFORMANCE STATUS: 1 - Symptomatic but completely ambulatory  Vitals:   12/05/19 0823  BP: (!) 129/92  Pulse: 65  Resp: 18  Temp: 98.2 F (36.8 C)  SpO2: 100%   Filed Weights   12/05/19 0823  Weight: 157 lb 11.2 oz (71.5 kg)      LABORATORY DATA:  I have reviewed the data as listed CMP Latest Ref Rng & Units 12/04/2019 11/20/2019 11/18/2019  Glucose 70 - 99 mg/dL 105(H) 96 94  BUN 6 - 20 mg/dL '9 11 11  ' Creatinine 0.44 - 1.00 mg/dL 0.68 0.74 0.69  Sodium 135 - 145 mmol/L 142 145 139  Potassium 3.5 - 5.1 mmol/L 3.9 4.4 3.6  Chloride 98 - 111 mmol/L 107 107 101  CO2 22 - 32 mmol/L '23 27 21  ' Calcium 8.9 - 10.3 mg/dL 9.6 9.2 9.6  Total Protein 6.5 - 8.1 g/dL 7.6 7.7 -  Total Bilirubin 0.3 - 1.2 mg/dL 0.6 0.5 -  Alkaline Phos 38 - 126 U/L 78 86 -  AST 15 - 41 U/L 23 19 -  ALT 0 - 44 U/L 25 15 -    Lab Results  Component Value Date   WBC 8.0 12/04/2019   HGB 14.1 12/04/2019   HCT 44.0 12/04/2019   MCV 85.6 12/04/2019   PLT 203 12/04/2019   NEUTROABS 2.8 11/20/2019    ASSESSMENT & PLAN:  Malignant neoplasm of upper-outer quadrant of right breast in female, estrogen receptor negative ([place]) 07/24/2017:Right lumpectomy: IDC grade 3, 1.8 cm, 0/2 lymph nodes negative,ER 0%, PR 0%, Ki-67 40%, HER-2 positive ratio 3.59, T1CN0 stage I a  Recommendation: 1. Adjuvant chemotherapy with Taxol Herceptin completed 3/4/2019followed by Herceptin maintenance for 1 year completed 08/26/2018 2. followed by adjuvant radiation therapy3/27/2019 to 01/15/2018 ------------------------------------------------------------------------------------------------------------------------------------ 11/29/2019 CT abdomen and pelvis performed to evaluate back pain: No evidence of metastatic disease. Severe diffuse arthralgias and myalgias: Accompanied by severe hot flashes.  I  discussed with her that some of these could be related to the hot flashes and I prescribed gabapentin to be taken at bedtime.  She tells me that she did not pick up her pain medication prescription.  We will check with her pharmacy to see if that is true.  I discussed with her about the addictive potential of narcotic pain medication and that I would prefer her not to go on chronic narcotics. We will check with the pharmacy if she did not fill the prescription.  If she did not then we will allow her to take 30 tablets of the Percocets.  I strongly recommended that she see her primary care physician for her chronic fibromyalgia pains and discomfort. From breast cancer standpoint there is no evidence of recurrent disease. We will see her once a year.  No orders of the defined types were placed in this encounter.  The patient has a  good understanding of the overall plan. she agrees with it. she will call with any problems that may develop before the next visit here.  Total time spent: 30 mins including face to face time and time spent for planning, charting and coordination of care  Nicholas Lose, MD 12/05/2019  I, Cloyde Reams Dorshimer, am acting as scribe for Dr. Nicholas Lose.  I have reviewed the above documentation for accuracy and completeness, and I agree with the above.

## 2019-12-05 ENCOUNTER — Inpatient Hospital Stay: Payer: Medicaid Other | Attending: Adult Health | Admitting: Hematology and Oncology

## 2019-12-05 ENCOUNTER — Other Ambulatory Visit: Payer: Self-pay

## 2019-12-05 DIAGNOSIS — Z79899 Other long term (current) drug therapy: Secondary | ICD-10-CM | POA: Insufficient documentation

## 2019-12-05 DIAGNOSIS — C50411 Malignant neoplasm of upper-outer quadrant of right female breast: Secondary | ICD-10-CM

## 2019-12-05 DIAGNOSIS — Z7951 Long term (current) use of inhaled steroids: Secondary | ICD-10-CM | POA: Diagnosis not present

## 2019-12-05 DIAGNOSIS — M797 Fibromyalgia: Secondary | ICD-10-CM | POA: Diagnosis not present

## 2019-12-05 DIAGNOSIS — Z171 Estrogen receptor negative status [ER-]: Secondary | ICD-10-CM | POA: Insufficient documentation

## 2019-12-05 DIAGNOSIS — Z9221 Personal history of antineoplastic chemotherapy: Secondary | ICD-10-CM | POA: Diagnosis not present

## 2019-12-05 DIAGNOSIS — Z923 Personal history of irradiation: Secondary | ICD-10-CM | POA: Diagnosis not present

## 2019-12-05 DIAGNOSIS — Z791 Long term (current) use of non-steroidal anti-inflammatories (NSAID): Secondary | ICD-10-CM | POA: Diagnosis not present

## 2019-12-05 MED ORDER — GABAPENTIN 300 MG PO CAPS: 300.0000 mg | ORAL_CAPSULE | Freq: Every day | ORAL | 6 refills | Status: DC

## 2019-12-05 NOTE — Assessment & Plan Note (Signed)
07/24/2017:Right lumpectomy: IDC grade 3, 1.8 cm, 0/2 lymph nodes negative,ER 0%, PR 0%, Ki-67 40%, HER-2 positive ratio 3.59, T1CN0 stage I a  Recommendation: 1. Adjuvant chemotherapy with Taxol Herceptin completed 3/4/2019followed by Herceptin maintenance for 1 year completed 08/26/2018 2. followed by adjuvant radiation therapy3/27/2019 to 01/15/2018 ------------------------------------------------------------------------------------------------------------------------------------ 11/29/2019 CT abdomen and pelvis performed to evaluate back pain: No evidence of metastatic disease.  

## 2019-12-08 ENCOUNTER — Encounter: Payer: Self-pay | Admitting: Hematology and Oncology

## 2019-12-08 ENCOUNTER — Other Ambulatory Visit: Payer: Self-pay | Admitting: Hematology and Oncology

## 2019-12-08 ENCOUNTER — Encounter: Payer: Self-pay | Admitting: Adult Health

## 2019-12-08 MED ORDER — OXYCODONE-ACETAMINOPHEN 5-325 MG PO TABS: 1.0000 | ORAL_TABLET | Freq: Three times a day (TID) | ORAL | 0 refills | Status: DC | PRN

## 2019-12-12 ENCOUNTER — Telehealth: Payer: Self-pay | Admitting: Hematology and Oncology

## 2019-12-12 NOTE — Telephone Encounter (Signed)
Scheduled per los, patient has been called and notified of upcoming appointments. 

## 2019-12-13 ENCOUNTER — Encounter: Payer: Self-pay | Admitting: Adult Health

## 2019-12-18 ENCOUNTER — Encounter: Payer: Self-pay | Admitting: Adult Health

## 2019-12-24 ENCOUNTER — Other Ambulatory Visit: Payer: Self-pay | Admitting: Adult Health

## 2019-12-24 DIAGNOSIS — C50411 Malignant neoplasm of upper-outer quadrant of right female breast: Secondary | ICD-10-CM

## 2019-12-31 ENCOUNTER — Institutional Professional Consult (permissible substitution): Payer: Medicaid Other | Admitting: Internal Medicine

## 2020-01-27 ENCOUNTER — Encounter: Payer: Self-pay | Admitting: Internal Medicine

## 2020-01-27 ENCOUNTER — Other Ambulatory Visit: Payer: Self-pay

## 2020-01-27 ENCOUNTER — Ambulatory Visit: Payer: Medicaid Other | Admitting: Internal Medicine

## 2020-01-27 VITALS — BP 122/80 | HR 65 | Ht 66.0 in | Wt 159.2 lb

## 2020-01-27 DIAGNOSIS — R002 Palpitations: Secondary | ICD-10-CM | POA: Diagnosis not present

## 2020-01-27 NOTE — Progress Notes (Signed)
HPI Katherine [name] is referred today by Mr. Katherine [name] for evaluation of palpitations. She tried to wear a cardiac monitor but it irritated her skin after only a day and she stopped wearing it. She notes palpitations which are awakening her from sleep. She states that they start and stop suddenly. She has had a single syncopal episode. She has normal LV function. She denies chest pain or sob. No edema. She is active helping to take care of her grandchildren.  Allergies  Allergen Reactions  . Shellfish Allergy Anaphylaxis  . Tea Nausea And Vomiting  . Penicillins Other (See Comments)    Yeast infection  Has patient had a PCN reaction causing immediate rash, facial/tongue/throat swelling, SOB or lightheadedness with hypotension: no Has patient had a PCN reaction causing severe rash involving mucus membranes or skin necrosis: no Has patient had a PCN reaction that required hospitalization no Has patient had a PCN reaction occurring within the last 10 years:no If all of the above answers are "NO", then may proceed with Cephalosporin use.   . Tomato Rash     Current Outpatient Medications  Medication Sig Dispense Refill  . albuterol (PROVENTIL HFA;VENTOLIN HFA) 108 (90 Base) MCG/ACT inhaler Inhale 2 puffs into the lungs every 6 (six) hours as needed for wheezing or shortness of breath.    . DULoxetine (CYMBALTA) 30 MG capsule TAKE 1 CAPSULE (30 MG TOTAL) BY MOUTH DAILY. X 1 WEEK THEN TAKE 2 CAPSULES DAILY 60 capsule 5  . esomeprazole (NEXIUM) 40 MG capsule Take 40 mg by mouth daily before breakfast.     . ibuprofen (ADVIL,MOTRIN) 600 MG tablet Take 1 tablet (600 mg total) by mouth every 6 (six) hours as needed. 30 tablet 0  . meloxicam (MOBIC) 7.5 MG tablet Take 7.5 mg by mouth daily.     . methocarbamol (ROBAXIN) 500 MG tablet Take 1 tablet (500 mg total) by mouth 2 (two) times daily. 20 tablet 0  . metoprolol succinate (TOPROL-XL) 25 MG 24 hr tablet TAKE 1 TABLET BY MOUTH EVERY DAY 30 tablet 6   . ondansetron (ZOFRAN ODT) 4 MG disintegrating tablet Take 1 tablet (4 mg total) by mouth every 8 (eight) hours as needed for nausea or vomiting. 4 tablet 0  . oxyCODONE-acetaminophen (PERCOCET/ROXICET) 5-325 MG tablet Take 1 tablet by mouth every 8 (eight) hours as needed for severe pain. 30 tablet 0  . propranolol (INDERAL) 10 MG tablet TAKE 1 TABLET BY MOUTH EVERY DAY AS NEEDED 20 tablet 2  . SYMBICORT 80-4.5 MCG/ACT inhaler Inhale 2 puffs into the lungs 3 (three) times daily.     No current facility-administered medications for this visit.     Past Medical History:  Diagnosis Date  . Anxiety   . Asthma   . Dysrhythmia    History of SVT-No medications  . Fibromyalgia   . GERD (gastroesophageal reflux disease)   . History of radiation therapy 12/19/17- 01/15/18   Right Breast, 2.67 Gy in 15 fractions for a total dose of 40.05 Gy. Boost, 2 Gy in 5 fractions for a total dose of 10 Gy  . Hot flashes   . 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 Migraine   . Palpitations   . Pneumonia    with cavitation of left lower lobe  . SVT (supraventricular tachycardia) ([place])    Zio Monitor 11/2019: Patient only wore for 3 day (adhesive allergy); NSR, Avg HR 65,  no arrhythmia  . Tuberculosis    exposure as a child, get checked frequently  . Uterine fibroid     ROS:   All systems reviewed and negative except as noted in the HPI.   Past Surgical History:  Procedure Laterality Date  . ABDOMINAL HYSTERECTOMY    . BREAST LUMPECTOMY Right   . BREAST LUMPECTOMY WITH RADIOACTIVE SEED AND SENTINEL LYMPH NODE BIOPSY Right 07/24/2017   Procedure: RIGHT BREAST LUMPECTOMY WITH RADIOACTIVE SEED AND SENTINEL LYMPH NODE BIOPSY;  Surgeon: Erroll Luna, MD;  Location: [facility];  Service: General;  Laterality: Right;  . PORT-A-CATH REMOVAL Right 06/18/2019   Procedure: REMOVAL PORT-A-CATH;  Surgeon: Erroll Luna, MD;  Location:  [facility];  Service: General;  Laterality: Right;  . PORTACATH PLACEMENT Right 07/24/2017   Procedure: INSERTION PORT-A-CATH;  Surgeon: Erroll Luna, MD;  Location: [facility];  Service: General;  Laterality: Right;  . TUBAL LIGATION  1993  . VAGINAL HYSTERECTOMY Bilateral 12/19/2016   Procedure: HYSTERECTOMY VAGINAL;  Surgeon: Lavonia Drafts, MD;  Location: [facility];  Service: Gynecology;  Laterality: Bilateral;  . WISDOM TOOTH EXTRACTION       Family History  Problem Relation Age of Onset  . Migraines Daughter   . Hypertension Mother   . Heart disease Mother   . Hypertension Father   . Heart disease Father   . Migraines Son   . Hypertension Other   . Colon cancer Neg Hx   . Colon polyps Neg Hx   . Diabetes Neg Hx   . Kidney disease Neg Hx   . Liver disease Neg Hx   . Heart attack Neg Hx   . Stroke Neg Hx      Social History   Socioeconomic History  . Marital status: Married    Spouse name: Not on file  . Number of children: 2  . Years of education: 77  . Highest education level: Not on file  Occupational History  . Occupation: unemployed  Tobacco Use  . Smoking status: Former Smoker    Packs/day: 0.50    Types: Cigarettes    Quit date: 07/16/2017    Years since quitting: 2.5  . Smokeless tobacco: Never Used  . Tobacco comment: smokes 1cigarette per day and more if she is stressed  Substance and Sexual Activity  . Alcohol use: No    Alcohol/week: 0.0 standard drinks  . Drug use: Yes    Types: Marijuana    Comment: occasional, for pain. states she hasnt used for a mont; 08/02/17 sometimes  . Sexual activity: Yes    Birth control/protection: Surgical  Other Topics Concern  . Not on file  Social History Narrative   On disability for heart condition.  Pt. Was non-specific.       Patient does not drink caffeine.   Patient is ambidextrous, but mostly right handed.    12th grade, some college courses   Social Determinants  of Health   Financial Resource Strain:   . Difficulty of Paying Living Expenses:   Food Insecurity:   . Worried About Charity fundraiser in the Last Year:   . Arboriculturist in the Last Year:   Transportation Needs:   . Film/video editor (Medical):   Marland Kitchen Lack of Transportation (Non-Medical):   Physical Activity:   . Days of Exercise per Week:   . Minutes of Exercise per Session:   Stress:   . Feeling of Stress :  Social Connections:   . Frequency of Communication with Friends and Family:   . Frequency of Social Gatherings with Friends and Family:   . Attends Religious Services:   . Active Member of Clubs or Organizations:   . Attends Archivist Meetings:   Marland Kitchen Marital Status:   Intimate Partner Violence:   . Fear of Current or Ex-Partner:   . Emotionally Abused:   Marland Kitchen Physically Abused:   . Sexually Abused:      BP 122/80   Pulse 65   Ht 5\' 6"  (1.676 m)   Wt 159 lb 3.2 oz (72.2 kg)   LMP 11/13/2016   SpO2 95%   BMI 25.70 kg/m   Physical Exam:  Well appearing NAD HEENT: Unremarkable Neck:  No JVD, no thyromegally Lymphatics:  No adenopathy Back:  No CVA tenderness Lungs:  Clear with no wheezes HEART:  Regular rate rhythm, no murmurs, no rubs, no clicks Abd:  soft, positive bowel sounds, no organomegally, no rebound, no guarding Ext:  2 plus pulses, no edema, no cyanosis, no clubbing Skin:  No rashes no nodules Neuro:  CN II through XII intact, motor grossly intact  DEVICE  Normal device function.  See PaceArt for details.   Assess/Plan: 1. Palpitations - the mechanism of her arrhythmias is unclear. She wakes up and feels her heart racing for several times a week. She cannot wear a cardiac monitor. I have offered her ILR insertion vs an emperic trial of flecainide for control of her symptoms. She will consider her options and let us know. She is symptomatic despite medical therapy with a beta blocker. 2. HTN - her bp is well controlled on her beta  blocker.

## 2020-01-27 NOTE — Patient Instructions (Addendum)
Medication Instructions:  Your physician recommends that you continue on your current medications as directed. Please refer to the Current Medication list given to you today.  Labwork: None ordered.  Testing/Procedures: None ordered.  Follow-Up:  Let us know if you would like to precede with loop implant.  Call Sonia Baller at [phone] or send a MyChart message  Any Other Special Instructions Will Be Listed Below (If Applicable).  If you need a refill on your cardiac medications before your next appointment, please call your pharmacy.    Implantable Loop Recorder Placement  An implantable loop recorder is a small electronic device that is placed under the skin of your chest. It is about the size of an AA ("double A") battery. The device records the electrical activity of your heart over a long period of time. Your health care provider can download these recordings to monitor your heart. You may need an implantable loop recorder if you have periods of abnormal heart activity (arrhythmias) or unexplained fainting (syncope). The recorder can be left in place for 1 year or longer. What are the risks? Generally, this is a safe procedure. However, problems may occur, including:  Infection.  Bleeding.  Allergic reactions to anesthetic medicines.  Damage to nerves or blood vessels.  Failure of the device to work. This could require another surgery to replace it. What happens before the procedure? You may take your normal morning medicines and eat breakfast.  There are no special instructions prior to your implant. What happens during the procedure?  You may be given one or more of the following: ? A medicine to numb the area (local anesthetic).  A small incision will be made on the left side of your upper chest.  A pocket will be created under your skin.  The device will be placed in the pocket.  The incision will be closed with stitches (sutures) or adhesive strips.  A  bandage (dressing) will be placed over the incision. The procedure may vary among health care providers and hospitals. What happens after the procedure? You will be discharged after the procedure.  You may drive yourself home. Summary  An implantable loop recorder is a small electronic device that is placed under the skin of your chest to monitor your heart over a long period of time.  The recorder can be left in place for 3 years or longer.  This information is not intended to replace advice given to you by your health care provider. Make sure you discuss any questions you have with your health care provider. Document Revised: 11/15/2017 Document Reviewed: 10/27/2017 Elsevier Patient Education  2020 [employer].

## 2020-02-02 NOTE — Telephone Encounter (Signed)
Pt was reflecting on LINQ I would recomm she have it done   She is agreeable now Wants to schedule   Would like her husband to be in the room if possible   I told her Dr Lovena Le would need to decide. Told her someone from office would call to schedule

## 2020-02-16 ENCOUNTER — Encounter: Payer: Self-pay | Admitting: Hematology and Oncology

## 2020-02-24 ENCOUNTER — Other Ambulatory Visit: Payer: Self-pay | Admitting: Hematology and Oncology

## 2020-02-24 MED ORDER — OXYCODONE-ACETAMINOPHEN 5-325 MG PO TABS: 1.0000 | ORAL_TABLET | Freq: Three times a day (TID) | ORAL | 0 refills | Status: DC | PRN

## 2020-02-24 NOTE — Telephone Encounter (Signed)
Patient request refill

## 2020-03-02 ENCOUNTER — Encounter: Payer: Self-pay | Admitting: Hematology and Oncology

## 2020-03-02 ENCOUNTER — Other Ambulatory Visit: Payer: Self-pay

## 2020-03-02 ENCOUNTER — Encounter: Payer: Self-pay | Admitting: Internal Medicine

## 2020-03-02 ENCOUNTER — Ambulatory Visit: Payer: Medicaid Other | Admitting: Internal Medicine

## 2020-03-02 VITALS — BP 102/64 | HR 62 | Ht 66.0 in | Wt 160.4 lb

## 2020-03-02 DIAGNOSIS — R002 Palpitations: Secondary | ICD-10-CM

## 2020-03-02 NOTE — Patient Instructions (Signed)
Medication Instructions:  Your physician recommends that you continue on your current medications as directed. Please refer to the Current Medication list given to you today.  Labwork: None ordered.  Testing/Procedures: None ordered.  Follow-Up:  Your physician wants you to follow-up in: one year with Dr. Taylor.  You will receive a reminder letter in the mail two months in advance. If you don't receive a letter, please call our office to schedule the follow-up appointment.   Any Other Special Instructions Will Be Listed Below (If Applicable).  If you need a refill on your cardiac medications before your next appointment, please call your pharmacy.    Implantable Loop Recorder Placement, Care After This sheet gives you information about how to care for yourself after your procedure. Your health care provider may also give you more specific instructions. If you have problems or questions, contact your health care provider. What can I expect after the procedure? After the procedure, it is common to have:  Soreness or discomfort near the incision.  Some swelling or bruising near the incision. Follow these instructions at home: Incision care   Follow instructions from your health care provider about how to take care of your incision. Make sure you: ? Leave your outer dressing on for 72 hours.  After 72 hours you can remove that and shower. ? Leave adhesive strips in place. These skin closures may need to stay in place for 2 weeks or longer. If adhesive strip edges start to loosen and curl up, you may trim the loose edges. Do not remove adhesive strips completely unless your health care provider tells you to do that.  Check your incision area every day for signs of infection. Check for: ? Redness, swelling, or pain. ? Fluid or blood. ? Warmth. ? Pus or a bad smell. Do not take baths, swim, or use a hot tub until your incision is completely healed.  Activity  Return to your  normal activities.  General instructions  Follow instructions from your health care provider about how to manage your implantable loop recorder and transmit the information. Learn how to activate a recording if this is necessary for your type of device.  Do not go through a metal detection gate, and do not let someone hold a metal detector over your chest. Show your ID card.  Do not have an MRI unless you check with your health care provider first.  Take over-the-counter and prescription medicines only as told by your health care provider.  Keep all follow-up visits as told by your health care provider. This is important. Contact a health care provider if:  You have redness, swelling, or pain around your incision.  You have a fever.  You have pain that is not relieved by your pain medicine.  You have triggered your device because of fainting (syncope) or because of a heartbeat that feels like it is racing, slow, fluttering, or skipping (palpitations). Get help right away if you have:  Chest pain.  Difficulty breathing. Summary  After the procedure, it is common to have soreness or discomfort near the incision.  Change your dressing as told by your health care provider.  Follow instructions from your health care provider about how to manage your implantable loop recorder and transmit the information.  Keep all follow-up visits as told by your health care provider. This is important. This information is not intended to replace advice given to you by your health care provider. Make sure you discuss any questions you   have with your health care provider. Document Released: 08/23/2015 Document Revised: 10/27/2017 Document Reviewed: 10/27/2017 Elsevier Patient Education  2020 Elsevier Inc.   

## 2020-03-02 NOTE — Progress Notes (Signed)
HPI Katherine [name] returns today for followup of her palpitations. The etiology is unclear. She tried to wear a cardiac monitor but stopped after a couple of days due to skin irritation.  She has preserved LV function. She presents today for ILR insertion . Allergies  Allergen Reactions   Shellfish Allergy Anaphylaxis   Tea Nausea And Vomiting   Penicillins Other (See Comments)    Yeast infection  Has patient had a PCN reaction causing immediate rash, facial/tongue/throat swelling, SOB or lightheadedness with hypotension: no Has patient had a PCN reaction causing severe rash involving mucus membranes or skin necrosis: no Has patient had a PCN reaction that required hospitalization no Has patient had a PCN reaction occurring within the last 10 years:no If all of the above answers are "NO", then may proceed with Cephalosporin use.    Tomato Rash     Current Outpatient Medications  Medication Sig Dispense Refill   albuterol (PROVENTIL HFA;VENTOLIN HFA) 108 (90 Base) MCG/ACT inhaler Inhale 2 puffs into the lungs every 6 (six) hours as needed for wheezing or shortness of breath.     DULoxetine (CYMBALTA) 30 MG capsule TAKE 1 CAPSULE (30 MG TOTAL) BY MOUTH DAILY. X 1 WEEK THEN TAKE 2 CAPSULES DAILY 60 capsule 5   esomeprazole (NEXIUM) 40 MG capsule Take 40 mg by mouth daily before breakfast.      ibuprofen (ADVIL,MOTRIN) 600 MG tablet Take 1 tablet (600 mg total) by mouth every 6 (six) hours as needed. 30 tablet 0   meloxicam (MOBIC) 7.5 MG tablet Take 7.5 mg by mouth daily.      methocarbamol (ROBAXIN) 500 MG tablet Take 1 tablet (500 mg total) by mouth 2 (two) times daily. 20 tablet 0   metoprolol succinate (TOPROL-XL) 25 MG 24 hr tablet TAKE 1 TABLET BY MOUTH EVERY DAY 30 tablet 6   ondansetron (ZOFRAN ODT) 4 MG disintegrating tablet Take 1 tablet (4 mg total) by mouth every 8 (eight) hours as needed for nausea or vomiting. 4 tablet 0   propranolol (INDERAL) 10 MG tablet  TAKE 1 TABLET BY MOUTH EVERY DAY AS NEEDED 20 tablet 2   SYMBICORT 80-4.5 MCG/ACT inhaler Inhale 2 puffs into the lungs 3 (three) times daily.     No current facility-administered medications for this visit.     Past Medical History:  Diagnosis Date   Anxiety    Asthma    Dysrhythmia    History of SVT-No medications   Fibromyalgia    GERD (gastroesophageal reflux disease)    History of radiation therapy 12/19/17- 01/15/18   Right Breast, 2.67 Gy in 15 fractions for a total dose of 40.05 Gy. 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   Past Surgical History:  Procedure Laterality Date   ABDOMINAL HYSTERECTOMY     BREAST LUMPECTOMY Right    BREAST  LUMPECTOMY WITH RADIOACTIVE SEED AND SENTINEL LYMPH NODE BIOPSY Right 07/24/2017   Procedure: RIGHT BREAST LUMPECTOMY WITH RADIOACTIVE SEED AND SENTINEL LYMPH NODE BIOPSY;  Surgeon: Erroll Luna, MD;  Location: [facility];  Service: General;  Laterality: Right;   PORT-A-CATH REMOVAL Right 06/18/2019   Procedure: REMOVAL PORT-A-CATH;  Surgeon: Erroll Luna, MD;  Location: [facility];  Service: General;  Laterality: Right;   PORTACATH PLACEMENT Right 07/24/2017   Procedure: INSERTION PORT-A-CATH;  Surgeon: Erroll Luna, MD;  Location: [facility];  Service: General;  Laterality: Right;   [place] Bilateral 12/19/2016   Procedure:  HYSTERECTOMY VAGINAL;  Surgeon: Lavonia Drafts, MD;  Location: [facility];  Service: Gynecology;  Laterality: Bilateral;   WISDOM TOOTH EXTRACTION       Family History  Problem Relation Age of Onset   Migraines Daughter    Hypertension Mother    Heart disease Mother    Hypertension Father    Heart disease Father    Migraines Son    Hypertension Other    Colon cancer Neg Hx    Colon polyps Neg Hx    Diabetes Neg Hx    Kidney disease Neg Hx    Liver disease Neg Hx    Heart attack Neg Hx    Stroke Neg Hx      Social History   Socioeconomic History   Marital status: Married    Spouse name: Not on file   Number of children: 2   Years of education: 15   Highest education level: Not on file  Occupational History   Occupation: unemployed  Tobacco Use   Smoking status: Former Smoker    Packs/day: 0.50    Types: Cigarettes    Quit date: 07/16/2017    Years since quitting: 2.6   Smokeless tobacco: Never Used   Tobacco comment: smokes 1cigarette per day and more if she is stressed  Substance and Sexual Activity   Alcohol use: No    Alcohol/week: 0.0 standard drinks   Drug use: Yes    Types: Marijuana    Comment: occasional, for pain. states she hasnt used for a mont; 08/02/17 sometimes   Sexual activity: Yes    Birth control/protection: Surgical  Other Topics Concern   Not on file  Social History Narrative   On disability for heart condition.  Pt. Was non-specific.       Patient does not drink caffeine.   Patient is ambidextrous, but mostly right handed.    12th grade, some college courses   Social Determinants of Health   Financial Resource Strain:    Difficulty of Paying Living Expenses:   Food Insecurity:    Worried About Charity fundraiser in the Last Year:    Arboriculturist in the Last Year:   Transportation Needs:    Film/video editor (Medical):    Lack of Transportation (Non-Medical):   Physical Activity:     Days of Exercise per Week:    Minutes of Exercise per Session:   Stress:    Feeling of Stress :   Social Connections:    Frequency of Communication with Friends and Family:    Frequency of Social Gatherings with Friends and Family:    Attends Religious Services:    Active Member of Clubs or Organizations:    Attends Archivist Meetings:    Marital Status:   Intimate Partner Violence:    Fear  of Current or Ex-Partner:    Emotionally Abused:    Physically Abused:    Sexually Abused:      BP 102/64    Pulse 62    Ht 5\' 6"  (1.676 m)    Wt 160 lb 6.4 oz (72.8 kg)    LMP 11/13/2016    SpO2 99%    BMI 25.89 kg/m   Physical Exam:  Well appearing NAD HEENT: Unremarkable Neck:  No JVD, no thyromegally Lymphatics:  No adenopathy Back:  No CVA tenderness Lungs:  Clear with no wheezes HEART:  Regular rate rhythm, no murmurs, no rubs, no clicks Abd:  soft, positive bowel sounds, no organomegally, no rebound, no guarding Ext:  2 plus pulses, no edema, no cyanosis, no clubbing Skin:  No rashes no nodules Neuro:  CN II through XII intact, motor grossly intact  EKG - none  Assess/Plan: 1. Palpitations - the etiology is unclear. I have discussed the indication for ILR insertion and she wishes to proceed.  2. HTN - her bp is well controlled.   EP Procedure Note  Procedure performed: ILR insertion  Preoperative diagnosis: palpitations  Postoperative diagnosis: palpitations  Decription of the procedure: after informed consent was obtained, the patient was prepped and draped in a sterile fashion. 10 cc of lidocaine was infiltrated into the left pectoral region. A one cm stab incision was carried out. The medtronic ILR, # [id number] was inserted. The R waves measured 0.5 mV. Benzoin and steristrips were painted on the skin. A bandage was applied and she was recovered in the usual manner.  Complications; none immediately  Conclusion: successful ILR insertion without  immediate complication.  Mikle Bosworth.D.

## 2020-04-05 ENCOUNTER — Ambulatory Visit (INDEPENDENT_AMBULATORY_CARE_PROVIDER_SITE_OTHER): Payer: Medicaid Other | Admitting: *Deleted

## 2020-04-05 DIAGNOSIS — R002 Palpitations: Secondary | ICD-10-CM

## 2020-04-06 LAB — CUP PACEART REMOTE DEVICE CHECK
Date Time Interrogation Session: 20210712000500
Implantable Pulse Generator Implant Date: 20210608

## 2020-04-06 NOTE — Progress Notes (Signed)
Carelink Summary Report / Loop Recorder 

## 2020-04-29 NOTE — Telephone Encounter (Signed)
I replied to patient's other mychart message that I would like the device clinic know as she has upcoming remote check 05/10/20.

## 2020-05-10 ENCOUNTER — Ambulatory Visit (INDEPENDENT_AMBULATORY_CARE_PROVIDER_SITE_OTHER): Payer: Medicaid Other | Admitting: *Deleted

## 2020-05-10 DIAGNOSIS — I471 Supraventricular tachycardia: Secondary | ICD-10-CM

## 2020-05-10 LAB — CUP PACEART REMOTE DEVICE CHECK
Date Time Interrogation Session: 20210813235103
Implantable Pulse Generator Implant Date: 20210608

## 2020-05-11 NOTE — Progress Notes (Signed)
Carelink Summary Report / Loop Recorder 

## 2020-05-28 ENCOUNTER — Telehealth: Payer: Self-pay

## 2020-05-28 NOTE — Telephone Encounter (Signed)
LMOVM for pt to stop sending manual transmissions.

## 2020-06-10 ENCOUNTER — Other Ambulatory Visit: Payer: Self-pay

## 2020-06-10 ENCOUNTER — Ambulatory Visit (INDEPENDENT_AMBULATORY_CARE_PROVIDER_SITE_OTHER): Payer: Medicaid Other | Admitting: Emergency Medicine

## 2020-06-10 DIAGNOSIS — Z95818 Presence of other cardiac implants and grafts: Secondary | ICD-10-CM

## 2020-06-10 DIAGNOSIS — R002 Palpitations: Secondary | ICD-10-CM

## 2020-06-10 NOTE — Telephone Encounter (Signed)
Returned call to pt.  She requested the results of her most recent summary report.  Advised report shows no real episodes, 1 false pause due to undersensing.  Pt reports LINQ site is very sore and swollen, ongoing for a few weeks.  Denies redness, drainage, fever, or chills.  Pt accepted appointment in [place] today at 2:30pm for site assessment.  She denies additional questions or concerns at this time.

## 2020-06-11 NOTE — Progress Notes (Signed)
Loop recorder site assessment per patient request due to reported swelling and tenderness.  Incision well healed, not evidence of drainage, redness, or swelling upon assessment.  Patient denies fever/chills.  Dr. Lovena Le also assessed site and reassured patient of normal findings.  Patient then requested that he prescribe "something for pain."  Dr. Lovena Le advised patient to utilize OTC pain relievers.  Patient verbalized understanding and agrees to call back for any signs/symptoms of infection.  Continue monthly summary reports, ROV with Dr. Lovena Le PRN.

## 2020-06-14 ENCOUNTER — Ambulatory Visit (INDEPENDENT_AMBULATORY_CARE_PROVIDER_SITE_OTHER): Payer: Medicaid Other | Admitting: *Deleted

## 2020-06-14 ENCOUNTER — Encounter: Payer: Self-pay | Admitting: Hematology and Oncology

## 2020-06-14 DIAGNOSIS — R002 Palpitations: Secondary | ICD-10-CM

## 2020-06-14 LAB — CUP PACEART REMOTE DEVICE CHECK
Date Time Interrogation Session: 20210915235407
Implantable Pulse Generator Implant Date: 20210608

## 2020-06-15 NOTE — Progress Notes (Signed)
Carelink Summary Report / Loop Recorder 

## 2020-06-28 ENCOUNTER — Encounter: Payer: Self-pay | Admitting: Hematology and Oncology

## 2020-07-07 ENCOUNTER — Telehealth: Payer: Self-pay

## 2020-07-07 NOTE — Telephone Encounter (Signed)
ILR implanted for palpitations.  Multiple alerts received for "pause" episodes that appear to be undersensing.  Forwarding to MD to request change in programming/ notifications.

## 2020-07-12 NOTE — Telephone Encounter (Signed)
Yes. GT

## 2020-07-13 LAB — CUP PACEART REMOTE DEVICE CHECK
Date Time Interrogation Session: 20211019003332
Implantable Pulse Generator Implant Date: 20210608

## 2020-07-19 ENCOUNTER — Other Ambulatory Visit: Payer: Self-pay

## 2020-07-19 ENCOUNTER — Ambulatory Visit (INDEPENDENT_AMBULATORY_CARE_PROVIDER_SITE_OTHER): Payer: Medicaid Other

## 2020-07-19 ENCOUNTER — Encounter (HOSPITAL_COMMUNITY): Payer: Self-pay | Admitting: Emergency Medicine

## 2020-07-19 ENCOUNTER — Emergency Department (HOSPITAL_COMMUNITY): Payer: Medicaid Other

## 2020-07-19 ENCOUNTER — Emergency Department (HOSPITAL_COMMUNITY)
Admission: EM | Admit: 2020-07-19 | Discharge: 2020-07-19 | Disposition: A | Discharge status: Home / Self Care | Payer: Medicaid Other | Attending: Emergency Medicine | Admitting: Emergency Medicine

## 2020-07-19 DIAGNOSIS — Z87891 Personal history of nicotine dependence: Secondary | ICD-10-CM | POA: Insufficient documentation

## 2020-07-19 DIAGNOSIS — R002 Palpitations: Secondary | ICD-10-CM

## 2020-07-19 DIAGNOSIS — J189 Pneumonia, unspecified organism: Secondary | ICD-10-CM

## 2020-07-19 DIAGNOSIS — Z853 Personal history of malignant neoplasm of breast: Secondary | ICD-10-CM | POA: Insufficient documentation

## 2020-07-19 DIAGNOSIS — R079 Chest pain, unspecified: Secondary | ICD-10-CM

## 2020-07-19 DIAGNOSIS — Z79899 Other long term (current) drug therapy: Secondary | ICD-10-CM | POA: Diagnosis not present

## 2020-07-19 DIAGNOSIS — J181 Lobar pneumonia, unspecified organism: Secondary | ICD-10-CM | POA: Diagnosis not present

## 2020-07-19 DIAGNOSIS — Z20822 Contact with and (suspected) exposure to covid-19: Secondary | ICD-10-CM | POA: Diagnosis not present

## 2020-07-19 DIAGNOSIS — J45909 Unspecified asthma, uncomplicated: Secondary | ICD-10-CM | POA: Insufficient documentation

## 2020-07-19 DIAGNOSIS — R0789 Other chest pain: Secondary | ICD-10-CM | POA: Diagnosis present

## 2020-07-19 LAB — CBC
HCT: 38.5 % (ref 36.0–46.0)
Hemoglobin: 12.2 g/dL (ref 12.0–15.0)
MCH: 27.5 pg (ref 26.0–34.0)
MCHC: 31.7 g/dL (ref 30.0–36.0)
MCV: 86.7 fL (ref 80.0–100.0)
Platelets: 198 10*3/uL (ref 150–400)
RBC: 4.44 MIL/uL (ref 3.87–5.11)
RDW: 14.9 % (ref 11.5–15.5)
WBC: 5.8 10*3/uL (ref 4.0–10.5)
nRBC: 0 % (ref 0.0–0.2)

## 2020-07-19 LAB — BASIC METABOLIC PANEL
Anion gap: 8 (ref 5–15)
BUN: 7 mg/dL (ref 6–20)
CO2: 24 mmol/L (ref 22–32)
Calcium: 9 mg/dL (ref 8.9–10.3)
Chloride: 108 mmol/L (ref 98–111)
Creatinine, Ser: 0.56 mg/dL (ref 0.44–1.00)
GFR, Estimated: >60 mL/min (ref >60)
Glucose, Bld: 94 mg/dL (ref 70–99)
Potassium: 3.6 mmol/L (ref 3.5–5.1)
Sodium: 140 mmol/L (ref 135–145)

## 2020-07-19 LAB — TROPONIN I (HIGH SENSITIVITY)
Troponin I (High Sensitivity): 2 ng/L (ref <18)
Troponin I (High Sensitivity): <2 ng/L (ref <18)

## 2020-07-19 LAB — RESPIRATORY PANEL BY RT PCR (FLU A&B, COVID)
Influenza A by PCR: NEGATIVE
Influenza B by PCR: NEGATIVE
SARS Coronavirus 2 by RT PCR: NEGATIVE

## 2020-07-19 LAB — D-DIMER, QUANTITATIVE: D-Dimer, Quant: <0.27 ug/mL-FEU (ref 0.00–0.50)

## 2020-07-19 MED ORDER — OXYCODONE-ACETAMINOPHEN 5-325 MG PO TABS
1.0000 | ORAL_TABLET | Freq: Once | ORAL | Status: AC
  Administered 2020-07-19: 1 via ORAL
  Filled 2020-07-19: qty 1

## 2020-07-19 MED ORDER — ACETAMINOPHEN 325 MG PO TABS
325.0000 mg | ORAL_TABLET | Freq: Once | ORAL | Status: DC
  Filled 2020-07-19: qty 1

## 2020-07-19 MED ORDER — ONDANSETRON HCL 4 MG/2ML IJ SOLN
4.0000 mg | Freq: Once | INTRAMUSCULAR | Status: AC
  Administered 2020-07-19: 4 mg via INTRAVENOUS
  Filled 2020-07-19: qty 2

## 2020-07-19 MED ORDER — KETOROLAC TROMETHAMINE 15 MG/ML IJ SOLN
15.0000 mg | Freq: Once | INTRAMUSCULAR | Status: AC
  Administered 2020-07-19: 15 mg via INTRAVENOUS
  Filled 2020-07-19: qty 1

## 2020-07-19 MED ORDER — DOXYCYCLINE HYCLATE 100 MG PO CAPS: 100.0000 mg | ORAL_CAPSULE | Freq: Two times a day (BID) | ORAL | 0 refills | Status: AC

## 2020-07-19 NOTE — ED Triage Notes (Signed)
Patient reports left lateral ribcage pain worse with deep inspiration onset last week . No cough /respirations unlabored . Denies injury or fall.

## 2020-07-19 NOTE — ED Notes (Signed)
Patient verbalized understanding of discharge instructions. Opportunity for questions and answers.  

## 2020-07-19 NOTE — ED Provider Notes (Signed)
[facility] [facility] EMERGENCY DEPARTMENT Provider Note   CSN: [id number] Arrival date & time: 07/19/20  1275     History Chief Complaint  Patient presents with  . Ribcage Pain    Katherine [name] is a 49 y.o. female.  The history is provided by the patient.  Chest Pain Pain location:  L lateral chest Pain quality: aching and sharp   Pain radiates to:  Does not radiate Pain severity:  Severe Onset quality:  Sudden Duration:  3 days Timing:  Constant Progression:  Unchanged Chronicity:  New Relieved by:  Leaning forward Exacerbated by: laying back, movement. Ineffective treatments:  None tried Associated symptoms: cough and shortness of breath   Associated symptoms: no abdominal pain, no back pain, no fever, no palpitations and no vomiting     HPI: A 49 year old patient presents for evaluation of chest pain. Initial onset of pain was more than 6 hours ago. The patient's chest pain is well-localized, is sharp and is not worse with exertion. The patient's chest pain is middle- or left-sided, is not described as heaviness/pressure/tightness and does not radiate to the arms/jaw/neck. The patient does not complain of nausea and denies diaphoresis. The patient has no history of stroke, has no history of peripheral artery disease, has not smoked in the past 90 days, denies any history of treated diabetes, has no relevant family history of coronary artery disease (first degree relative at less than age 58), is not hypertensive, has no history of hypercholesterolemia and does not have an elevated BMI (>=30).   Past Medical History:  Diagnosis Date  . Anxiety   . Asthma   . Dysrhythmia    History of SVT-No medications  . Fibromyalgia   . GERD (gastroesophageal reflux disease)   . History of radiation therapy 12/19/17- 01/15/18   Right Breast, 2.67 Gy in 15 fractions for a total dose of 40.05 Gy. Boost, 2 Gy in 5 fractions for a total dose of 10 Gy  . Hot flashes   . Malignant  neoplasm of upper-outer quadrant of right female breast ([place]) 06/2017   right breast  . Migraine   . Palpitations   . Pneumonia    with cavitation of left lower lobe  . SVT (supraventricular tachycardia) ([place])    Zio Monitor 11/2019: Patient only wore for 3 day (adhesive allergy); NSR, Avg HR 65, no arrhythmia  . Tuberculosis    exposure as a child, get checked frequently  . Uterine fibroid     Patient Active Problem List   Diagnosis Date Noted  . Chemotherapy-induced peripheral neuropathy ([place]) 12/17/2017  . Port-A-Cath in place 09/24/2017  . Malignant neoplasm of upper-outer quadrant of right breast in female, estrogen receptor negative ([place]) 07/03/2017  . Pelvic pain in female 12/21/2016  . Fibroids, intramural 12/21/2016  . Post-operative state 12/19/2016  . Nocturia 08/09/2015  . Fibromyalgia 06/25/2015  . GERD (gastroesophageal reflux disease) 06/16/2013  . Migraine 06/16/2013  . Atrial tachycardia, paroxysmal ([place]) 03/31/2013  . Headache 12/20/2012  . Syncope 06/20/2012  . Fibroids 03/26/2012  . Family history of breast cancer 01/22/2012  . Seasonal and perennial allergic rhinitis 07/18/2010  . HAIR LOSS 07/04/2010  . ANXIETY DISORDER, SITUATIONAL, MILD 11/17/2009  . CHEST PAIN, LEFT 10/12/2009  . DYSURIA 08/26/2009  . MAMMOGRAM, ABNORMAL, RIGHT, HX OF 04/09/2009  . TOBACCO ABUSE 03/17/2009  . DECREASED HEARING, RIGHT EAR 10/27/2008  . BACK PAIN, LUMBAR 06/06/2007  . HOT FLASHES 07/18/2006    Past Surgical History:  Procedure  Laterality Date  . ABDOMINAL HYSTERECTOMY    . BREAST LUMPECTOMY Right   . BREAST LUMPECTOMY WITH RADIOACTIVE SEED AND SENTINEL LYMPH NODE BIOPSY Right 07/24/2017   Procedure: RIGHT BREAST LUMPECTOMY WITH RADIOACTIVE SEED AND SENTINEL LYMPH NODE BIOPSY;  Surgeon: Erroll Luna, MD;  Location: [facility];  Service: General;  Laterality: Right;  . PORT-A-CATH REMOVAL Right 06/18/2019   Procedure: REMOVAL PORT-A-CATH;  Surgeon:  Erroll Luna, MD;  Location: [facility];  Service: General;  Laterality: Right;  . PORTACATH PLACEMENT Right 07/24/2017   Procedure: INSERTION PORT-A-CATH;  Surgeon: Erroll Luna, MD;  Location: [facility];  Service: General;  Laterality: Right;  . TUBAL LIGATION  1993  . VAGINAL HYSTERECTOMY Bilateral 12/19/2016   Procedure: HYSTERECTOMY VAGINAL;  Surgeon: Lavonia Drafts, MD;  Location: [facility];  Service: Gynecology;  Laterality: Bilateral;  . WISDOM TOOTH EXTRACTION       OB History    Gravida  2   Para  2   Term  1   Preterm  1   AB      Living  2     SAB      TAB      Ectopic      Multiple      Live Births              Family History  Problem Relation Age of Onset  . Migraines Daughter   . Hypertension Mother   . Heart disease Mother   . Hypertension Father   . Heart disease Father   . Migraines Son   . Hypertension Other   . Colon cancer Neg Hx   . Colon polyps Neg Hx   . Diabetes Neg Hx   . Kidney disease Neg Hx   . Liver disease Neg Hx   . Heart attack Neg Hx   . Stroke Neg Hx     Social History   Tobacco Use  . Smoking status: Former Smoker    Packs/day: 0.50    Types: Cigarettes    Quit date: 07/16/2017    Years since quitting: 3.0  . Smokeless tobacco: Never Used  . Tobacco comment: smokes 1cigarette per day and more if she is stressed  Vaping Use  . Vaping Use: Never used  Substance Use Topics  . Alcohol use: No    Alcohol/week: 0.0 standard drinks  . Drug use: Yes    Types: Marijuana    Comment: occasional, for pain. states she hasnt used for a mont; 08/02/17 sometimes    Home Medications Prior to Admission medications   Medication Sig Start Date End Date Taking? Authorizing Provider  albuterol (PROVENTIL HFA;VENTOLIN HFA) 108 (90 Base) MCG/ACT inhaler Inhale 2 puffs into the lungs every 6 (six) hours as needed for wheezing or shortness of breath.   Yes [provider]    DULoxetine (CYMBALTA) 30 MG capsule TAKE 1 CAPSULE (30 MG TOTAL) BY MOUTH DAILY. X 1 WEEK THEN TAKE 2 CAPSULES DAILY Patient taking differently: Take 60 mg by mouth daily.  12/24/19  Yes Causey, Charlestine Massed, NP  esomeprazole (NEXIUM) 40 MG capsule Take 40 mg by mouth daily before breakfast.  04/28/19  Yes [provider]  ibuprofen (ADVIL,MOTRIN) 600 MG tablet Take 1 tablet (600 mg total) by mouth every 6 (six) hours as needed. Patient taking differently: Take 600 mg by mouth every 6 (six) hours as needed for mild pain.  11/17/17  Yes Noemi Chapel, MD  meloxicam (  MOBIC) 7.5 MG tablet Take 7.5 mg by mouth daily.  03/20/19  Yes [provider]  metoprolol succinate (TOPROL-XL) 25 MG 24 hr tablet TAKE 1 TABLET BY MOUTH EVERY DAY Patient taking differently: Take 25 mg by mouth daily.  11/17/19  Yes Fay Records, MD  propranolol (INDERAL) 10 MG tablet TAKE 1 TABLET BY MOUTH EVERY DAY AS NEEDED Patient taking differently: Take 10 mg by mouth daily as needed (for Heart Rate).  08/25/19  Yes Weaver, Scott T, PA-C  SYMBICORT 80-4.5 MCG/ACT inhaler Inhale 2 puffs into the lungs 3 (three) times daily. 11/14/19  Yes [provider]  doxycycline (VIBRAMYCIN) 100 MG capsule Take 1 capsule (100 mg total) by mouth 2 (two) times daily for 5 days. 07/19/20 07/24/20  Launa Flight, MD  methocarbamol (ROBAXIN) 500 MG tablet Take 1 tablet (500 mg total) by mouth 2 (two) times daily. Patient not taking: Reported on 06/10/2020 12/04/19   Delia Heady, PA-C  ondansetron (ZOFRAN ODT) 4 MG disintegrating tablet Take 1 tablet (4 mg total) by mouth every 8 (eight) hours as needed for nausea or vomiting. Patient not taking: Reported on 06/10/2020 12/04/19   Delia Heady, PA-C    Allergies    Shellfish allergy, Tea, Penicillins, and Tomato  Review of Systems   Review of Systems  Constitutional: Negative for chills and fever.  HENT: Positive for sore throat and voice change (mild hoarseness).  Negative for ear pain.   Eyes: Negative for pain and visual disturbance.  Respiratory: Positive for cough and shortness of breath.   Cardiovascular: Positive for chest pain. Negative for palpitations.  Gastrointestinal: Negative for abdominal pain and vomiting.  Genitourinary: Negative for dysuria and hematuria.  Musculoskeletal: Negative for arthralgias and back pain.  Skin: Negative for color change and rash.  Neurological: Negative for seizures and syncope.  All other systems reviewed and are negative.   Physical Exam Updated Vital Signs BP (!) 141/76   Pulse 60   Temp 98.2 F (36.8 C) (Oral)   Resp 18   LMP 11/13/2016   SpO2 100%   Physical Exam Vitals and nursing note reviewed.  Constitutional:      Appearance: She is well-developed. She is not ill-appearing, toxic-appearing or diaphoretic.  HENT:     Head: Normocephalic and atraumatic.     Mouth/Throat:     Mouth: Mucous membranes are moist.     Pharynx: Oropharynx is clear. No oropharyngeal exudate or posterior oropharyngeal erythema.  Eyes:     Conjunctiva/sclera: Conjunctivae normal.     Pupils: Pupils are equal, round, and reactive to light.  Cardiovascular:     Rate and Rhythm: Normal rate and regular rhythm.     Heart sounds: No murmur heard.  No gallop.   Pulmonary:     Effort: Pulmonary effort is normal. No respiratory distress.     Breath sounds: Normal breath sounds.  Chest:     Chest wall: Tenderness (localized over L lateral chest) present.  Abdominal:     Palpations: Abdomen is soft.     Tenderness: There is no abdominal tenderness. There is no guarding or rebound.  Musculoskeletal:     Cervical back: Neck supple.  Skin:    General: Skin is warm and dry.  Neurological:     General: No focal deficit present.     Mental Status: She is alert.     ED Results / Procedures / Treatments   Labs (all labs ordered are listed, but only abnormal results are displayed)  Labs Reviewed  RESPIRATORY PANEL  BY RT PCR (FLU A&B, COVID)  BASIC METABOLIC PANEL  CBC  D-DIMER, QUANTITATIVE (NOT AT [facility])  TROPONIN I (HIGH SENSITIVITY)  TROPONIN I (HIGH SENSITIVITY)    EKG EKG Interpretation  Date/Time:  Monday July 19 2020 08:31:45 EDT Ventricular Rate:  55 PR Interval:    QRS Duration: 94 QT Interval:  413 QTC Calculation: 395 R Axis:   0 Text Interpretation: Sinus rhythm Anteroseptal infarct, age indeterminate No significant change since last tracing Confirmed by Blanchie Dessert [id number]) on 07/19/2020 8:42:25 AM   Radiology DG Chest Port 1 View  Result Date: 07/19/2020 CLINICAL DATA:  Left lateral rib cage pain. EXAM: PORTABLE CHEST 1 VIEW COMPARISON:  07/24/2017 FINDINGS: 0736 hours. Right lung clear. Subtle small focus of opacity at the left base likely atelectasis or scar although pneumonia not excluded. No pneumothorax or pleural effusion. The cardiopericardial silhouette is within normal limits for size. The visualized bony structures of the thorax show no acute abnormality. IMPRESSION: Subtle patchy opacity at the left base likely atelectasis or scar although pneumonia not excluded. Electronically Signed   By: Misty Stanley M.D.   On: 07/19/2020 07:50    Procedures Procedures (including critical care time)  Medications Ordered in ED Medications  acetaminophen (TYLENOL) tablet 325 mg (325 mg Oral Not Given 07/19/20 0847)  ondansetron (ZOFRAN) injection 4 mg (4 mg Intravenous Given 07/19/20 0847)  oxyCODONE-acetaminophen (PERCOCET/ROXICET) 5-325 MG per tablet 1 tablet (1 tablet Oral Given 07/19/20 0846)  ketorolac (TORADOL) 15 MG/ML injection 15 mg (15 mg Intravenous Given 07/19/20 1056)    ED Course  I have reviewed the triage vital signs and the nursing notes.  Pertinent labs & imaging results that were available during my care of the patient were reviewed by me and considered in my medical decision making (see chart for details).    MDM Rules/Calculators/A&P HEAR  Score: 2                        The patient is a 49yo female, PMH breast cancer in remission, GERD, palpitations s/p ILR placement, who presents to the ED for chest pain.  On my initial evaluation, the patient is  hemodynamically stable, afebrile, nontoxic-appearing. Physical exam remarkable for localized marked tenderness to palpation to lateral L chest.  Differentials considered include ACS, PE, PTX, PNA, pleurisy, pericarditis. EKG with normal sinus rhythm, nonspecific T wave inversions in anterior leads that are relatively unchanged from prior, no ST changes, QTC 395 ms. No evidence of pericarditis on EKG.   Patient provided Percocet for pain. Labs remarkable for negative troponin and D dimer.  Chest x-ray remarkable for left base opacity at could be pneumonia or small infarct from PE, but with negative D-dimer, normal heart rate, no hypoxia, low suspicion for PE.  With cough and pleuritic chest pain and a very small opacity on chest x-ray, patient symptoms may be an early pneumonia. COVID test ordered and negative.  On reevaluation, patient reports some ongoing pain but appears more comfortable.  Provided IV Toradol for pain.  Advised patient of possibility of pneumonia with some pleuritic chest pain.  Advised treatment of pain with Tylenol and Motrin every 6 hours as needed at home, and prescribed doxycycline for 5 days for treatment of pneumonia.  Advise follow-up with primary care physician.  Strict return precautions provided.  Patient discharged in stable condition.  The care of this patient was overseen by Dr. Maryan Rued, who agreed with  evaluation and plan of care.   Final Clinical Impression(s) / ED Diagnoses Final diagnoses:  Chest pain, unspecified type  Community acquired pneumonia of left lower lobe of lung    Rx / DC Orders ED Discharge Orders         Ordered    doxycycline (VIBRAMYCIN) 100 MG capsule  2 times daily        07/19/20 1054           Launa Flight,  MD 07/19/20 1101    Blanchie Dessert, MD 07/22/20 2216

## 2020-07-22 NOTE — Progress Notes (Signed)
Carelink Summary Report / Loop Recorder 

## 2020-07-26 ENCOUNTER — Telehealth: Payer: Self-pay

## 2020-07-26 NOTE — Telephone Encounter (Signed)
Patient called, apt. Made in device clinic 08/10/20.

## 2020-07-26 NOTE — Telephone Encounter (Signed)
Error

## 2020-08-10 ENCOUNTER — Ambulatory Visit (INDEPENDENT_AMBULATORY_CARE_PROVIDER_SITE_OTHER): Payer: Medicaid Other | Admitting: Emergency Medicine

## 2020-08-10 ENCOUNTER — Other Ambulatory Visit: Payer: Self-pay

## 2020-08-10 DIAGNOSIS — I471 Supraventricular tachycardia: Secondary | ICD-10-CM

## 2020-08-10 LAB — CUP PACEART INCLINIC DEVICE CHECK
Date Time Interrogation Session: 20211116100913
Implantable Pulse Generator Implant Date: 20210608

## 2020-08-10 MED ORDER — PROPRANOLOL HCL 10 MG PO TABS
10.0000 mg | ORAL_TABLET | Freq: Every day | ORAL | 2 refills | Status: DC | PRN
Start: 2020-08-10 — End: 2020-09-08

## 2020-08-10 MED ORDER — METOPROLOL SUCCINATE ER 25 MG PO TB24
25.0000 mg | ORAL_TABLET | Freq: Every day | ORAL | 2 refills | Status: DC
Start: 2020-08-10 — End: 2020-11-23

## 2020-08-10 NOTE — Progress Notes (Signed)
Loop check in clinic. Battery status: Good  R-waves 0.27 mV. 2 symptom episodes, 0 tachy episodes, 11 pause episodes, 0 brady episodes. 0 AF episodes (0% burden). Patient presents in-clinic for reprogramming due to undersensing of r waves triggering false pause episodes.  Episodes and settings reviewed with Dr. Lovena Le, device reprogrammed extending pause detection to 4.5 seconds.  Device clock adjusted for appropriate time.  Cerified alert settings at symptom + detection in [facility].  Monthly summary reports and ROV with Dr. Lovena Le.

## 2020-08-20 LAB — CUP PACEART REMOTE DEVICE CHECK
Date Time Interrogation Session: 20211121000104
Implantable Pulse Generator Implant Date: 20210608

## 2020-08-23 ENCOUNTER — Ambulatory Visit (INDEPENDENT_AMBULATORY_CARE_PROVIDER_SITE_OTHER): Payer: Medicaid Other

## 2020-08-23 DIAGNOSIS — R002 Palpitations: Secondary | ICD-10-CM | POA: Diagnosis not present

## 2020-08-30 NOTE — Progress Notes (Signed)
Carelink Summary Report / Loop Recorder 

## 2020-09-06 ENCOUNTER — Other Ambulatory Visit: Payer: Self-pay | Admitting: Internal Medicine

## 2020-09-27 ENCOUNTER — Ambulatory Visit (INDEPENDENT_AMBULATORY_CARE_PROVIDER_SITE_OTHER): Payer: Medicaid Other

## 2020-09-27 DIAGNOSIS — I471 Supraventricular tachycardia: Secondary | ICD-10-CM

## 2020-09-27 LAB — CUP PACEART REMOTE DEVICE CHECK
Date Time Interrogation Session: 20220101233546
Implantable Pulse Generator Implant Date: 20210608

## 2020-10-12 ENCOUNTER — Telehealth: Payer: Self-pay | Admitting: *Deleted

## 2020-10-12 ENCOUNTER — Encounter: Payer: Self-pay | Admitting: Hematology and Oncology

## 2020-10-12 ENCOUNTER — Other Ambulatory Visit (INDEPENDENT_AMBULATORY_CARE_PROVIDER_SITE_OTHER): Payer: Self-pay | Admitting: Emergency Medicine

## 2020-10-12 NOTE — Telephone Encounter (Signed)
Received call from pt with complaint of right breast pain.  Pt states breast is warm to the touch, swollen, and painful to touch x1 day.  Pt denies recent injury or trauma. Pt also denies fever or drainage from the breast. Pt requesting to be seen by MD and states she does not have the funds to pay for OTC tylenol or ibuprofen to alleviate the pain.  Apt scheduled and pt verbalized understanding of apt date and time for further evaluation.

## 2020-10-12 NOTE — Progress Notes (Signed)
Patient Care Team: Pavelock, Duke Salvia, MD as PCP - General (Internal Medicine) Pricilla Riffle, MD as PCP - Cardiology (Cardiology) Harriette Bouillon, MD as Consulting Physician (General Surgery) Serena Croissant, MD as Consulting Physician (Hematology and Oncology) Lonie Peak, MD as Attending Physician (Radiation Oncology)  DIAGNOSIS:    ICD-10-CM   1. Malignant neoplasm of upper-outer quadrant of right breast in female, estrogen receptor negative ([facility])  C50.411 MM DIAG BREAST TOMO BILATERAL   Z17.1     SUMMARY OF ONCOLOGIC HISTORY: Oncology History  Malignant neoplasm of upper-outer quadrant of right breast in female, estrogen receptor negative ([facility])  06/26/2017 Initial Diagnosis   Right breast biopsy 8:30 position 8 cm from nipple: IDC with DCIS, second biopsy 6 cm from nipple fibrocystic changes: Grade 3, ER 0%, PR 0%, Ki-67 40%, HER-2 positive ratio 3.59; mammogram and ultrasound revealed 1.9 cm mass at 8:30 position, adjacent 0.7 cm cystic mass, T1c N0 stage IA AJCC 8    07/24/2017 Surgery   Right lumpectomy: IDC grade 3, 1.8 cm, 0/2 lymph nodes negative,ER 0%, PR 0%, Ki-67 40%, HER-2 positive ratio 3.59, T1CN0 stage I a    09/10/2017 - 11/26/2017 Chemotherapy   Taxol Herceptin weekly x12 followed by Herceptin maintenance for 1 year   12/19/2017 - 01/15/2018 Radiation Therapy   Adjuvant radiation therapy     CHIEF COMPLIANT: Follow-up of right breast cancer, pain and swelling  INTERVAL HISTORY: Katherine [name] is a 50 y.o. with above-mentioned history of right breast cancer treated with lumpectomy, adjuvant chemotherapy, radiation, Herceptin maintenance and who is currently on surveillance. She presents to the clinic today due to right breast pain and swelling.  She tells me that she has been having pain in bilateral breast for a long time right greater than left.  She had a monitor placed in the medial aspect of the left breast and she tells me that it is causing her pain and  discomfort in the left breast as well because of that.  ALLERGIES:  is allergic to shellfish allergy, tea, penicillins, and tomato.  MEDICATIONS:  Current Outpatient Medications  Medication Sig Dispense Refill  . albuterol (PROVENTIL HFA;VENTOLIN HFA) 108 (90 Base) MCG/ACT inhaler Inhale 2 puffs into the lungs every 6 (six) hours as needed for wheezing or shortness of breath.    . DULoxetine (CYMBALTA) 30 MG capsule TAKE 1 CAPSULE (30 MG TOTAL) BY MOUTH DAILY. X 1 WEEK THEN TAKE 2 CAPSULES DAILY (Patient taking differently: Take 60 mg by mouth daily. ) 60 capsule 5  . esomeprazole (NEXIUM) 40 MG capsule Take 40 mg by mouth daily before breakfast.     . ibuprofen (ADVIL,MOTRIN) 600 MG tablet Take 1 tablet (600 mg total) by mouth every 6 (six) hours as needed. (Patient taking differently: Take 600 mg by mouth every 6 (six) hours as needed for mild pain. ) 30 tablet 0  . meloxicam (MOBIC) 7.5 MG tablet Take 7.5 mg by mouth daily.     . methocarbamol (ROBAXIN) 500 MG tablet Take 1 tablet (500 mg total) by mouth 2 (two) times daily. (Patient not taking: Reported on 06/10/2020) 20 tablet 0  . metoprolol succinate (TOPROL-XL) 25 MG 24 hr tablet Take 1 tablet (25 mg total) by mouth daily. 30 tablet 2  . ondansetron (ZOFRAN ODT) 4 MG disintegrating tablet Take 1 tablet (4 mg total) by mouth every 8 (eight) hours as needed for nausea or vomiting. (Patient not taking: Reported on 06/10/2020) 4 tablet 0  . propranolol (INDERAL)  10 MG tablet TAKE 1 TABLET BY MOUTH EVERY DAY AS NEEDED 30 tablet 6  . SYMBICORT 80-4.5 MCG/ACT inhaler Inhale 2 puffs into the lungs 3 (three) times daily.     No current facility-administered medications for this visit.    PHYSICAL EXAMINATION: ECOG PERFORMANCE STATUS: 1 - Symptomatic but completely ambulatory  Vitals:   10/13/20 1338  BP: 119/81  Pulse: 61  Resp: 18  Temp: (!) 97.3 F (36.3 C)  SpO2: 98%   Filed Weights   10/13/20 1338  Weight: 153 lb 3.2 oz (69.5 kg)     BREAST: Diffuse tenderness in bilateral breast.  No palpable masses or nodules in either right or left breasts. No palpable axillary supraclavicular or infraclavicular adenopathy no breast tenderness or nipple discharge. (exam performed in the presence of a chaperone)  LABORATORY DATA:  I have reviewed the data as listed CMP Latest Ref Rng & Units 07/19/2020 12/04/2019 11/20/2019  Glucose 70 - 99 mg/dL 94 409(W) 96  BUN 6 - 20 mg/dL 7 9 11   Creatinine 0.44 - 1.00 mg/dL 1.19 1.47 8.29  Sodium 135 - 145 mmol/L 140 142 145  Potassium 3.5 - 5.1 mmol/L 3.6 3.9 4.4  Chloride 98 - 111 mmol/L 108 107 107  CO2 22 - 32 mmol/L 24 23 27   Calcium 8.9 - 10.3 mg/dL 9.0 9.6 9.2  Total Protein 6.5 - 8.1 g/dL - 7.6 7.7  Total Bilirubin 0.3 - 1.2 mg/dL - 0.6 0.5  Alkaline Phos 38 - 126 U/L - 78 86  AST 15 - 41 U/L - 23 19  ALT 0 - 44 U/L - 25 15    Lab Results  Component Value Date   WBC 5.8 07/19/2020   HGB 12.2 07/19/2020   HCT 38.5 07/19/2020   MCV 86.7 07/19/2020   PLT 198 07/19/2020   NEUTROABS 2.8 11/20/2019    ASSESSMENT & PLAN:  Malignant neoplasm of upper-outer quadrant of right breast in female, estrogen receptor negative ([facility]) 07/24/2017:Right lumpectomy: IDC grade 3, 1.8 cm, 0/2 lymph nodes negative,ER 0%, PR 0%, Ki-67 40%, HER-2 positive ratio 3.59, T1CN0 stage I a  Recommendation: 1. Adjuvant chemotherapy with Taxol Herceptin completed 3/4/2019followed by Herceptin maintenance for 1 year completed 08/26/2018 2. followed by adjuvant radiation therapy3/27/2019 to 01/15/2018 ------------------------------------------------------------------------------------------------------------------------------------ 11/29/2019 CT abdomen and pelvis performed to evaluate back pain: No evidence of metastatic disease. Severe diffuse arthralgias and myalgias:  Most of the symptoms could be related to fibromyalgia She is going to see a pain specialist.  Right breast pain: Unclear etiology.   No palpable lumps or nodules.  This could be related to her fibromyalgia. Breast cancer surveillance: She has not had a mammogram in 2021.  We will try to get a mammogram in the next week or 2.  Return to clinic in 1 year for follow-up.  Orders Placed This Encounter  Procedures  . MM DIAG BREAST TOMO BILATERAL    Standing Status:   Future    Standing Expiration Date:   10/13/2021    Order Specific Question:   Reason for Exam (SYMPTOM  OR DIAGNOSIS REQUIRED)    Answer:   Annual mammograms. C/O tenderness both breasts    Order Specific Question:   Is the patient pregnant?    Answer:   No    Order Specific Question:   Preferred imaging location?    Answer:   [facility]    Order Specific Question:   Release to patient    Answer:   Immediate  The patient has a good understanding of the overall plan. she agrees with it. she will call with any problems that may develop before the next visit here.  Total time spent: 20 mins including face to face time and time spent for planning, charting and coordination of care  Serena Croissant, MD 10/13/2020  I, Kirt Boys Dorshimer, am acting as scribe for Dr. Serena Croissant.  I have reviewed the above documentation for accuracy and completeness, and I agree with the above.

## 2020-10-12 NOTE — Progress Notes (Signed)
Carelink Summary Report / Loop Recorder 

## 2020-10-13 ENCOUNTER — Inpatient Hospital Stay: Payer: Medicaid Other | Attending: Hematology and Oncology | Admitting: Hematology and Oncology

## 2020-10-13 ENCOUNTER — Other Ambulatory Visit: Payer: Self-pay

## 2020-10-13 DIAGNOSIS — Z923 Personal history of irradiation: Secondary | ICD-10-CM | POA: Diagnosis not present

## 2020-10-13 DIAGNOSIS — Z7951 Long term (current) use of inhaled steroids: Secondary | ICD-10-CM | POA: Diagnosis not present

## 2020-10-13 DIAGNOSIS — Z791 Long term (current) use of non-steroidal anti-inflammatories (NSAID): Secondary | ICD-10-CM | POA: Diagnosis not present

## 2020-10-13 DIAGNOSIS — C50411 Malignant neoplasm of upper-outer quadrant of right female breast: Secondary | ICD-10-CM | POA: Diagnosis not present

## 2020-10-13 DIAGNOSIS — Z9221 Personal history of antineoplastic chemotherapy: Secondary | ICD-10-CM | POA: Diagnosis not present

## 2020-10-13 DIAGNOSIS — Z79899 Other long term (current) drug therapy: Secondary | ICD-10-CM | POA: Insufficient documentation

## 2020-10-13 DIAGNOSIS — Z171 Estrogen receptor negative status [ER-]: Secondary | ICD-10-CM | POA: Insufficient documentation

## 2020-10-13 NOTE — Assessment & Plan Note (Signed)
07/24/2017:Right lumpectomy: IDC grade 3, 1.8 cm, 0/2 lymph nodes negative,ER 0%, PR 0%, Ki-67 40%, HER-2 positive ratio 3.59, T1CN0 stage I a  Recommendation: 1. Adjuvant chemotherapy with Taxol Herceptin completed 3/4/2019followed by Herceptin maintenance for 1 year completed 08/26/2018 2. followed by adjuvant radiation therapy3/27/2019 to 01/15/2018 ------------------------------------------------------------------------------------------------------------------------------------ 11/29/2019 CT abdomen and pelvis performed to evaluate back pain: No evidence of metastatic disease. Severe diffuse arthralgias and myalgias: Accompanied by severe hot flashes.  I discussed with her that some of these could be related to the hot flashes and I prescribed gabapentin to be taken at bedtime. Most of the symptoms could be related to fibromyalgia  Right breast pain:

## 2020-10-29 ENCOUNTER — Other Ambulatory Visit (HOSPITAL_BASED_OUTPATIENT_CLINIC_OR_DEPARTMENT_OTHER): Payer: Self-pay | Admitting: Emergency Medicine

## 2020-10-29 LAB — CUP PACEART REMOTE DEVICE CHECK
Date Time Interrogation Session: 20220203233550
Implantable Pulse Generator Implant Date: 20210608

## 2020-11-01 ENCOUNTER — Ambulatory Visit (INDEPENDENT_AMBULATORY_CARE_PROVIDER_SITE_OTHER): Payer: Medicaid Other

## 2020-11-01 DIAGNOSIS — R002 Palpitations: Secondary | ICD-10-CM | POA: Diagnosis not present

## 2020-11-08 NOTE — Progress Notes (Signed)
Carelink Summary Report / Loop Recorder 

## 2020-11-21 ENCOUNTER — Other Ambulatory Visit: Payer: Self-pay | Admitting: Internal Medicine

## 2020-12-02 NOTE — Progress Notes (Signed)
Patient Care Team: Pavelock, Ralene Bathe, MD as PCP - General (Internal Medicine) Fay Records, MD as PCP - Cardiology (Cardiology) Erroll Luna, MD as Consulting Physician (General Surgery) Nicholas Lose, MD as Consulting Physician (Hematology and Oncology) Eppie Gibson, MD as Attending Physician (Radiation Oncology)  DIAGNOSIS:    ICD-10-CM   1. Malignant neoplasm of upper-outer quadrant of right breast in female, estrogen receptor negative ([place])  C50.411    Z17.1     SUMMARY OF ONCOLOGIC HISTORY: Oncology History  Malignant neoplasm of upper-outer quadrant of right breast in female, estrogen receptor negative ([place])  06/26/2017 Initial Diagnosis   Right breast biopsy 8:30 position 8 cm from nipple: IDC with DCIS, second biopsy 6 cm from nipple fibrocystic changes: Grade 3, ER 0%, PR 0%, Ki-67 40%, HER-2 positive ratio 3.59; mammogram and ultrasound revealed 1.9 cm mass at 8:30 position, adjacent 0.7 cm cystic mass, T1c N0 stage IA AJCC 8    07/24/2017 Surgery   Right lumpectomy: IDC grade 3, 1.8 cm, 0/2 lymph nodes negative,ER 0%, PR 0%, Ki-67 40%, HER-2 positive ratio 3.59, T1CN0 stage I a    09/10/2017 - 11/26/2017 Chemotherapy   Taxol Herceptin weekly x12 followed by Herceptin maintenance for 1 year   12/19/2017 - 01/15/2018 Radiation Therapy   Adjuvant radiation therapy     CHIEF COMPLIANT: Follow-up of right breast cancer, right breast pain  INTERVAL HISTORY: Katherine [name] is a 50 y.o. with above-mentioned history of right breast cancer treated with lumpectomy,adjuvant chemotherapy,radiation, Herceptin maintenance and who is currently on surveillance. She presents to the clinic today for follow-up of recent right breast pain.  Continues to have chronic right breast pain.  ALLERGIES:  is allergic to shellfish allergy, tea, penicillins, and tomato.  MEDICATIONS:  Current Outpatient Medications  Medication Sig Dispense Refill  . albuterol (PROVENTIL HFA;VENTOLIN HFA) 108  (90 Base) MCG/ACT inhaler Inhale 2 puffs into the lungs every 6 (six) hours as needed for wheezing or shortness of breath.    . DULoxetine (CYMBALTA) 30 MG capsule TAKE 1 CAPSULE (30 MG TOTAL) BY MOUTH DAILY. X 1 WEEK THEN TAKE 2 CAPSULES DAILY (Patient taking differently: Take 60 mg by mouth daily. ) 60 capsule 5  . esomeprazole (NEXIUM) 40 MG capsule Take 40 mg by mouth daily before breakfast.     . ibuprofen (ADVIL) 600 MG tablet Take 1 tablet (600 mg total) by mouth every 6 (six) hours as needed for mild pain. 60 tablet 3  . SYMBICORT 80-4.5 MCG/ACT inhaler Inhale 2 puffs into the lungs 3 (three) times daily.     No current facility-administered medications for this visit.    PHYSICAL EXAMINATION: ECOG PERFORMANCE STATUS: 1 - Symptomatic but completely ambulatory  Vitals:   12/03/20 1046  BP: 125/72  Pulse: (!) 59  Resp: 20  Temp: 97.9 F (36.6 C)  SpO2: 100%   Filed Weights   12/03/20 1046  Weight: 158 lb 6.4 oz (71.8 kg)       LABORATORY DATA:  I have reviewed the data as listed CMP Latest Ref Rng & Units 07/19/2020 12/04/2019 11/20/2019  Glucose 70 - 99 mg/dL 94 105(H) 96  BUN 6 - 20 mg/dL '7 9 11  ' Creatinine 0.44 - 1.00 mg/dL 0.56 0.68 0.74  Sodium 135 - 145 mmol/L 140 142 145  Potassium 3.5 - 5.1 mmol/L 3.6 3.9 4.4  Chloride 98 - 111 mmol/L 108 107 107  CO2 22 - 32 mmol/L '24 23 27  ' Calcium 8.9 - 10.3  mg/dL 9.0 9.6 9.2  Total Protein 6.5 - 8.1 g/dL - 7.6 7.7  Total Bilirubin 0.3 - 1.2 mg/dL - 0.6 0.5  Alkaline Phos 38 - 126 U/L - 78 86  AST 15 - 41 U/L - 23 19  ALT 0 - 44 U/L - 25 15    Lab Results  Component Value Date   WBC 5.8 07/19/2020   HGB 12.2 07/19/2020   HCT 38.5 07/19/2020   MCV 86.7 07/19/2020   PLT 198 07/19/2020   NEUTROABS 2.8 11/20/2019    ASSESSMENT & PLAN:  Malignant neoplasm of upper-outer quadrant of right breast in female, estrogen receptor negative ([place]) 07/24/2017:Right lumpectomy: IDC grade 3, 1.8 cm, 0/2 lymph nodes negative,ER  0%, PR 0%, Ki-67 40%, HER-2 positive ratio 3.59, T1CN0 stage I a  Recommendation: 1. Adjuvant chemotherapy with Taxol Herceptin completed 3/4/2019followed by Herceptin maintenance for 1 yearcompleted 08/26/2018 2. followed by adjuvant radiation therapy3/27/2019 to 01/15/2018 ------------------------------------------------------------------------------------------------------------------------------------ 11/29/2019 CT abdomen and pelvis performed to evaluate back pain: No evidence of metastatic disease. Severe diffuse arthralgias and myalgias:    Right breast pain: Could be related to fibromyalgia.  Breast cancer surveillance:   1.  Mammogram 12/24/2018: Benign breast density category D, mammogram scheduled for 12/27/2020 2. breast exam 12/03/2020: Benign  Return to clinic in 1 year for follow-up.    No orders of the defined types were placed in this encounter.  The patient has a good understanding of the overall plan. she agrees with it. she will call with any problems that may develop before the next visit here.  Total time spent: 20 mins including face to face time and time spent for planning, charting and coordination of care  Rulon Eisenmenger, MD, MPH 12/03/2020  I, Molly Dorshimer, am acting as scribe for Dr. Nicholas Lose.  I have reviewed the above documentation for accuracy and completeness, and I agree with the above.

## 2020-12-03 ENCOUNTER — Other Ambulatory Visit: Payer: Self-pay

## 2020-12-03 ENCOUNTER — Inpatient Hospital Stay: Payer: Medicaid Other | Attending: Hematology and Oncology | Admitting: Hematology and Oncology

## 2020-12-03 ENCOUNTER — Other Ambulatory Visit: Payer: Self-pay | Admitting: *Deleted

## 2020-12-03 DIAGNOSIS — Z791 Long term (current) use of non-steroidal anti-inflammatories (NSAID): Secondary | ICD-10-CM | POA: Diagnosis not present

## 2020-12-03 DIAGNOSIS — C50411 Malignant neoplasm of upper-outer quadrant of right female breast: Secondary | ICD-10-CM | POA: Insufficient documentation

## 2020-12-03 DIAGNOSIS — Z9221 Personal history of antineoplastic chemotherapy: Secondary | ICD-10-CM | POA: Diagnosis not present

## 2020-12-03 DIAGNOSIS — Z171 Estrogen receptor negative status [ER-]: Secondary | ICD-10-CM | POA: Insufficient documentation

## 2020-12-03 DIAGNOSIS — Z923 Personal history of irradiation: Secondary | ICD-10-CM | POA: Insufficient documentation

## 2020-12-03 DIAGNOSIS — Z79899 Other long term (current) drug therapy: Secondary | ICD-10-CM | POA: Insufficient documentation

## 2020-12-03 MED ORDER — IBUPROFEN 600 MG PO TABS: 600.0000 mg | ORAL_TABLET | Freq: Four times a day (QID) | ORAL | 3 refills | Status: DC | PRN

## 2020-12-03 NOTE — Assessment & Plan Note (Signed)
07/24/2017:Right lumpectomy: IDC grade 3, 1.8 cm, 0/2 lymph nodes negative,ER 0%, PR 0%, Ki-67 40%, HER-2 positive ratio 3.59, T1CN0 stage I a  Recommendation: 1. Adjuvant chemotherapy with Taxol Herceptin completed 3/4/2019followed by Herceptin maintenance for 1 yearcompleted 08/26/2018 2. followed by adjuvant radiation therapy3/27/2019 to 01/15/2018 ------------------------------------------------------------------------------------------------------------------------------------ 11/29/2019 CT abdomen and pelvis performed to evaluate back pain: No evidence of metastatic disease. Severe diffuse arthralgias and myalgias:  Most of the symptoms could be related to fibromyalgia She is going to see a pain specialist.  Right breast pain:This could be related to her fibromyalgia.  Breast cancer surveillance:   1.  Mammogram 12/24/2018: Benign breast density category D, mammogram scheduled for 12/27/2020 2. breast exam 12/03/2020: Benign  Return to clinic in 1 year for follow-up.

## 2020-12-06 ENCOUNTER — Ambulatory Visit (INDEPENDENT_AMBULATORY_CARE_PROVIDER_SITE_OTHER): Payer: Medicaid Other

## 2020-12-06 ENCOUNTER — Telehealth: Payer: Self-pay | Admitting: Hematology and Oncology

## 2020-12-06 DIAGNOSIS — R002 Palpitations: Secondary | ICD-10-CM

## 2020-12-06 NOTE — Telephone Encounter (Signed)
Checked out appointment. No LOS notes needing to be scheduled. No changes made. 

## 2020-12-08 LAB — CUP PACEART REMOTE DEVICE CHECK
Date Time Interrogation Session: 20220309004942
Implantable Pulse Generator Implant Date: 20210608

## 2020-12-09 ENCOUNTER — Encounter: Payer: Self-pay | Admitting: Student

## 2020-12-09 ENCOUNTER — Telehealth: Payer: Self-pay | Admitting: *Deleted

## 2020-12-09 ENCOUNTER — Ambulatory Visit (INDEPENDENT_AMBULATORY_CARE_PROVIDER_SITE_OTHER): Payer: Medicaid Other | Admitting: Student

## 2020-12-09 ENCOUNTER — Other Ambulatory Visit: Payer: Self-pay

## 2020-12-09 VITALS — BP 136/70 | HR 53 | Ht 66.0 in | Wt 156.0 lb

## 2020-12-09 DIAGNOSIS — R0602 Shortness of breath: Secondary | ICD-10-CM

## 2020-12-09 DIAGNOSIS — I471 Supraventricular tachycardia: Secondary | ICD-10-CM | POA: Diagnosis not present

## 2020-12-09 DIAGNOSIS — R002 Palpitations: Secondary | ICD-10-CM

## 2020-12-09 LAB — CUP PACEART INCLINIC DEVICE CHECK
Date Time Interrogation Session: 20220317123430
Implantable Pulse Generator Implant Date: 20210608

## 2020-12-09 NOTE — Patient Instructions (Signed)
Medication Instructions:  Your physician recommends that you continue on your current medications as directed. Please refer to the Current Medication list given to you today.  *If you need a refill on your cardiac medications before your next appointment, please call your pharmacy*   Lab Work: TODAY: BMET, CBC, TSH If you have labs (blood work) drawn today and your tests are completely normal, you will receive your results only by: Marland Kitchen MyChart Message (if you have MyChart) OR . A paper copy in the mail If you have any lab test that is abnormal or we need to change your treatment, we will call you to review the results.   Testing/Procedures: Your physician has requested that you have an echocardiogram. Echocardiography is a painless test that uses sound waves to create images of your heart. It provides your doctor with information about the size and shape of your heart and how well your heart's chambers and valves are working. This procedure takes approximately one hour. There are no restrictions for this procedure.  Follow-Up: At [facility], you and your health needs are our priority.  As part of our continuing mission to provide you with exceptional heart care, we have created designated Provider Care Teams.  These Care Teams include your primary Cardiologist (physician) and Advanced Practice Providers (APPs -  Physician Assistants and Nurse Practitioners) who all work together to provide you with the care you need, when you need it.  Your next appointment:   6 month(s)  The format for your next appointment:   In Person  Provider:   Legrand Como "Oda Kilts, PA-C

## 2020-12-09 NOTE — Progress Notes (Signed)
PCP:  Javier Docker, MD Primary Cardiologist: Dorris Carnes, MD Electrophysiologist: Cristopher Peru, MD   Katherine [name] is a 50 y.o. female seen today for Cristopher Peru, MD for acute visit due to palpitations and chest pain.  Since last being seen in our clinic the patient reports doing OK. She states approximately 2 days ago she had severe upper stomach cramping and had to lie down.  She noted upper epigastric pains and centralized chest pains as well with this. She has also noted nausea and diarrhea.  Her pain was not related to exertion. She had mild continued pain yesterday, but has had none today.  She has had intermittent chest pains like this in the past, seen in ED for back/chest pain 06/2020 with normal work up. CTA 06/2018 for chest pain negative for acute findings as well as negative troponin at that time.  She has had issues with scar tissue from management of her breast cancer, and also has a history of radiation.   Past Medical History:  Diagnosis Date  . Anxiety   . Asthma   . Dysrhythmia    History of SVT-No medications  . Fibromyalgia   . GERD (gastroesophageal reflux disease)   . History of radiation therapy 12/19/17- 01/15/18   Right Breast, 2.67 Gy in 15 fractions for a total dose of 40.05 Gy. Boost, 2 Gy in 5 fractions for a total dose of 10 Gy  . Hot flashes   . Malignant neoplasm of upper-outer quadrant of right female breast ([place]) 06/2017   right breast  . Migraine   . Palpitations   . Pneumonia    with cavitation of left lower lobe  . SVT (supraventricular tachycardia) ([place])    Zio Monitor 11/2019: Patient only wore for 3 day (adhesive allergy); NSR, Avg HR 65, no arrhythmia  . Tuberculosis    exposure as a child, get checked frequently  . Uterine fibroid    Past Surgical History:  Procedure Laterality Date  . ABDOMINAL HYSTERECTOMY    . BREAST LUMPECTOMY Right   . BREAST LUMPECTOMY WITH RADIOACTIVE SEED AND SENTINEL LYMPH NODE BIOPSY Right 07/24/2017    Procedure: RIGHT BREAST LUMPECTOMY WITH RADIOACTIVE SEED AND SENTINEL LYMPH NODE BIOPSY;  Surgeon: Erroll Luna, MD;  Location: [facility];  Service: General;  Laterality: Right;  . PORT-A-CATH REMOVAL Right 06/18/2019   Procedure: REMOVAL PORT-A-CATH;  Surgeon: Erroll Luna, MD;  Location: [facility];  Service: General;  Laterality: Right;  . PORTACATH PLACEMENT Right 07/24/2017   Procedure: INSERTION PORT-A-CATH;  Surgeon: Erroll Luna, MD;  Location: [facility];  Service: General;  Laterality: Right;  . TUBAL LIGATION  1993  . VAGINAL HYSTERECTOMY Bilateral 12/19/2016   Procedure: HYSTERECTOMY VAGINAL;  Surgeon: Lavonia Drafts, MD;  Location: [facility];  Service: Gynecology;  Laterality: Bilateral;  . WISDOM TOOTH EXTRACTION      Current Outpatient Medications  Medication Sig Dispense Refill  . albuterol (PROVENTIL HFA;VENTOLIN HFA) 108 (90 Base) MCG/ACT inhaler Inhale 2 puffs into the lungs every 6 (six) hours as needed for wheezing or shortness of breath.    . DULoxetine (CYMBALTA) 30 MG capsule TAKE 1 CAPSULE (30 MG TOTAL) BY MOUTH DAILY. X 1 WEEK THEN TAKE 2 CAPSULES DAILY (Patient taking differently: Take 60 mg by mouth daily.) 60 capsule 5  . esomeprazole (NEXIUM) 40 MG capsule Take 40 mg by mouth daily before breakfast.     . ibuprofen (ADVIL) 600 MG tablet Take 1 tablet (  600 mg total) by mouth every 6 (six) hours as needed for mild pain. 60 tablet 3  . SYMBICORT 80-4.5 MCG/ACT inhaler Inhale 2 puffs into the lungs 3 (three) times daily.     No current facility-administered medications for this visit.    Allergies  Allergen Reactions  . Shellfish Allergy Anaphylaxis  . Tea Nausea And Vomiting  . Penicillins Other (See Comments)    Yeast infection  Has patient had a PCN reaction causing immediate rash, facial/tongue/throat swelling, SOB or lightheadedness with hypotension: no Has patient had a PCN reaction causing severe  rash involving mucus membranes or skin necrosis: no Has patient had a PCN reaction that required hospitalization no Has patient had a PCN reaction occurring within the last 10 years:no If all of the above answers are "NO", then may proceed with Cephalosporin use.   . Tomato Rash    Social History   Socioeconomic History  . Marital status: Married    Spouse name: Not on file  . Number of children: 2  . Years of education: 6  . Highest education level: Not on file  Occupational History  . Occupation: unemployed  Tobacco Use  . Smoking status: Former Smoker    Packs/day: 0.50    Types: Cigarettes    Quit date: 07/16/2017    Years since quitting: 3.4  . Smokeless tobacco: Never Used  . Tobacco comment: smokes 1cigarette per day and more if she is stressed  Vaping Use  . Vaping Use: Never used  Substance and Sexual Activity  . Alcohol use: No    Alcohol/week: 0.0 standard drinks  . Drug use: Yes    Types: Marijuana    Comment: occasional, for pain. states she hasnt used for a mont; 08/02/17 sometimes  . Sexual activity: Yes    Birth control/protection: Surgical  Other Topics Concern  . Not on file  Social History Narrative   On disability for heart condition.  Pt. Was non-specific.       Patient does not drink caffeine.   Patient is ambidextrous, but mostly right handed.    12th grade, some college courses   Social Determinants of Health   Financial Resource Strain: Not on file  Food Insecurity: Not on file  Transportation Needs: Not on file  Physical Activity: Not on file  Stress: Not on file  Social Connections: Not on file  Intimate Partner Violence: Not on file     Review of Systems: General: No chills, fever, night sweats or weight changes  Cardiovascular:  No chest pain, dyspnea on exertion, edema, orthopnea, palpitations, paroxysmal nocturnal dyspnea Dermatological: No rash, lesions or masses Respiratory: No cough, dyspnea Urologic: No hematuria,  dysuria Abdominal: No nausea, vomiting, diarrhea, bright red blood per rectum, melena, or hematemesis Neurologic: No visual changes, weakness, changes in mental status All other systems reviewed and are otherwise negative except as noted above.  Physical Exam: Vitals:   12/09/20 1155  BP: 136/70  Pulse: (!) 53  SpO2: 97%  Weight: 156 lb (70.8 kg)  Height: 5\' 6"  (1.676 m)    GEN- The patient is well appearing, alert and oriented x 3 today.   HEENT: normocephalic, atraumatic; sclera clear, conjunctiva pink; hearing intact; oropharynx clear; neck supple, no JVP Lymph- no cervical lymphadenopathy Lungs- Clear to ausculation bilaterally, normal work of breathing.  No wheezes, rales, rhonchi Heart- Regular rate and rhythm, no murmurs, rubs or gallops, PMI not laterally displaced GI- soft, non-tender, non-distended, bowel sounds present, no hepatosplenomegaly Extremities- no  clubbing, cyanosis, or edema; DP/PT/radial pulses 2+ bilaterally MS- no significant deformity or atrophy Skin- warm and dry, no rash or lesion Psych- euthymic mood, full affect Neuro- strength and sensation are intact  EKG is ordered. Personal review of EKG from today shows sinus bradycardia at 53 bpm  Additional studies reviewed include: Previous EP office notes. Previous ED notes and testing.   Assessment and Plan:  1. Chest pain, non cardiac She has had this previously with work up unremarkable. No exertional component.  HS troponin negative with similar pains in 06/2020, CTA and troponin negative with similar pains in 06/2018 ? If pt has scar tissue or component of chest wall pain given history of radiation.  This particular episode was also accompanied by epigastric pain, nausea, and diarrhea, so was likely aggravated by a GI syndrome.  Will update echo for completeness and pt peace of mind. Normal echo 2019 in setting of Herceptin therapy. No longterm association of cardiomyopathy post Herceptin treatment.   Overall I think her pre-test probability for an ischemic work up is relatively low. She will continue to monitor these symptoms and alert [place] if they increase in frequency, severity, or duration.   2. Palpitations Symptom episodes on ILR consistent with NSR   Labs today and echo for completeness. RTC 6 months. Sooner with more symptoms.   Shirley Friar, PA-C  12/09/20 12:39 PM

## 2020-12-09 NOTE — Telephone Encounter (Signed)
Noted for appt today.

## 2020-12-09 NOTE — Telephone Encounter (Signed)
Spoke with patient. This week she has been getting chest pains, intermittent, that come on when her heart starts racing and pounding.  This is accompanied by SOB, dizziness, weakness.  No syncope.  Checks BP and HR with her watch and says it always comes up normal.  Was unable to provide any readings.    Reviewed with APP and added to schedule today.  Pt appreciative for the appointment.  She states she is in need of a new PCP because her PCP office is giong to be closing.

## 2020-12-15 NOTE — Progress Notes (Signed)
Carelink Summary Report / Loop Recorder 

## 2020-12-27 ENCOUNTER — Telehealth: Payer: Self-pay

## 2020-12-27 ENCOUNTER — Other Ambulatory Visit: Payer: Self-pay

## 2020-12-27 ENCOUNTER — Other Ambulatory Visit: Payer: Self-pay | Admitting: Hematology and Oncology

## 2020-12-27 ENCOUNTER — Ambulatory Visit
Admission: RE | Admit: 2020-12-27 | Discharge: 2020-12-27 | Disposition: A | Discharge status: Home / Self Care | Payer: Medicaid Other | Source: Ambulatory Visit | Attending: Hematology and Oncology | Admitting: Hematology and Oncology

## 2020-12-27 DIAGNOSIS — Z171 Estrogen receptor negative status [ER-]: Secondary | ICD-10-CM

## 2020-12-27 NOTE — Telephone Encounter (Signed)
Linq alert received- Incomplete event report, need manual transmission.    Spoke with to have her send manual transmission.    Transmission reviewed, missing episode appears to be pause episode from undersensing.  Pt reports she would have been asleep at 8pm.    Will continue to monitor.

## 2021-01-10 ENCOUNTER — Other Ambulatory Visit: Payer: Self-pay

## 2021-01-10 ENCOUNTER — Ambulatory Visit (HOSPITAL_COMMUNITY): Payer: Medicaid Other | Attending: Cardiology

## 2021-01-10 ENCOUNTER — Ambulatory Visit (INDEPENDENT_AMBULATORY_CARE_PROVIDER_SITE_OTHER): Payer: Medicaid Other

## 2021-01-10 DIAGNOSIS — R0602 Shortness of breath: Secondary | ICD-10-CM | POA: Diagnosis not present

## 2021-01-10 DIAGNOSIS — R002 Palpitations: Secondary | ICD-10-CM

## 2021-01-10 DIAGNOSIS — I471 Supraventricular tachycardia: Secondary | ICD-10-CM | POA: Insufficient documentation

## 2021-01-10 LAB — ECHOCARDIOGRAM COMPLETE
Area-P 1/2: 2.58 cm2
S' Lateral: 2.9 cm

## 2021-01-11 LAB — CUP PACEART REMOTE DEVICE CHECK
Date Time Interrogation Session: 20220411015228
Implantable Pulse Generator Implant Date: 20210608

## 2021-01-17 ENCOUNTER — Encounter: Payer: Self-pay | Admitting: Hematology and Oncology

## 2021-01-26 NOTE — Progress Notes (Signed)
Carelink Summary Report / Loop Recorder 

## 2021-02-03 ENCOUNTER — Encounter: Payer: Self-pay | Admitting: Hematology and Oncology

## 2021-02-10 LAB — CUP PACEART REMOTE DEVICE CHECK
Date Time Interrogation Session: 20220514015317
Implantable Pulse Generator Implant Date: 20210608

## 2021-02-14 ENCOUNTER — Ambulatory Visit (INDEPENDENT_AMBULATORY_CARE_PROVIDER_SITE_OTHER): Payer: Medicaid Other

## 2021-02-14 ENCOUNTER — Ambulatory Visit (HOSPITAL_COMMUNITY)
Admission: EM | Admit: 2021-02-14 | Discharge: 2021-02-14 | Discharge status: Against Medical Advice | Payer: Medicaid Other

## 2021-02-14 DIAGNOSIS — R002 Palpitations: Secondary | ICD-10-CM

## 2021-02-17 ENCOUNTER — Encounter (HOSPITAL_COMMUNITY): Payer: Self-pay | Admitting: *Deleted

## 2021-02-17 ENCOUNTER — Emergency Department (HOSPITAL_COMMUNITY)
Admission: EM | Admit: 2021-02-17 | Discharge: 2021-02-17 | Disposition: A | Discharge status: Home / Self Care | Payer: Medicaid Other | Attending: Physician Assistant | Admitting: Physician Assistant

## 2021-02-17 DIAGNOSIS — L309 Dermatitis, unspecified: Secondary | ICD-10-CM | POA: Diagnosis not present

## 2021-02-17 DIAGNOSIS — Z87891 Personal history of nicotine dependence: Secondary | ICD-10-CM | POA: Insufficient documentation

## 2021-02-17 DIAGNOSIS — Z853 Personal history of malignant neoplasm of breast: Secondary | ICD-10-CM | POA: Insufficient documentation

## 2021-02-17 DIAGNOSIS — R21 Rash and other nonspecific skin eruption: Secondary | ICD-10-CM

## 2021-02-17 DIAGNOSIS — J45909 Unspecified asthma, uncomplicated: Secondary | ICD-10-CM | POA: Insufficient documentation

## 2021-02-17 MED ORDER — HYDROCORTISONE 1 % EX CREA: TOPICAL_CREAM | CUTANEOUS | 0 refills | Status: AC

## 2021-02-17 MED ORDER — PREDNISONE 10 MG (21) PO TBPK: ORAL_TABLET | Freq: Every day | ORAL | 0 refills | Status: DC

## 2021-02-17 NOTE — ED Provider Notes (Signed)
[facility] EMERGENCY DEPARTMENT Provider Note   CSN: [id number] Arrival date & time: 02/17/21  1444     History Chief Complaint  Patient presents with  . Allergic Reaction  . Rash    Katherine [name] is a 50 y.o. female.  HPI   pt is a 50 y/o female with a h/o anxiety, asthma, svt, gerd, breast ca, who presents to the ed today for eval of rash. States she has had an itchy rash to her back for the last several days. Denies any swelling to her face/tongue. Denies wheezing or sob. Has tried otc creams without relief. Denies any new soaps, detergents, medications, or environmental exposures.   Past Medical History:  Diagnosis Date  . Anxiety   . Asthma   . Dysrhythmia    History of SVT-No medications  . Fibromyalgia   . GERD (gastroesophageal reflux disease)   . History of radiation therapy 12/19/17- 01/15/18   Right Breast, 2.67 Gy in 15 fractions for a total dose of 40.05 Gy. Boost, 2 Gy in 5 fractions for a total dose of 10 Gy  . Hot flashes   . 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 Migraine   . Palpitations   . Pneumonia    with cavitation of left lower lobe  . SVT (supraventricular tachycardia) ([place])    Zio Monitor 11/2019: Patient only wore for 3 day (adhesive allergy); NSR, Avg HR 65, no arrhythmia  . Tuberculosis    exposure as a child, get checked frequently  . Uterine fibroid     Patient Active Problem List   Diagnosis Date Noted  . Chemotherapy-induced peripheral neuropathy ([place]) 12/17/2017  . Port-A-Cath in place 09/24/2017  . Malignant neoplasm of upper-outer quadrant of right breast in female, estrogen receptor negative ([place]) 07/03/2017  . Pelvic pain in female 12/21/2016  . Fibroids, intramural 12/21/2016  . Post-operative state 12/19/2016  . Nocturia 08/09/2015  . Fibromyalgia 06/25/2015  . GERD (gastroesophageal reflux disease) 06/16/2013  . Migraine 06/16/2013  . Atrial  tachycardia, paroxysmal ([place]) 03/31/2013  . Headache 12/20/2012  . Syncope 06/20/2012  . Fibroids 03/26/2012  . Family history of breast cancer 01/22/2012  . Seasonal and perennial allergic rhinitis 07/18/2010  . HAIR LOSS 07/04/2010  . ANXIETY DISORDER, SITUATIONAL, MILD 11/17/2009  . CHEST PAIN, LEFT 10/12/2009  . DYSURIA 08/26/2009  . MAMMOGRAM, ABNORMAL, RIGHT, HX OF 04/09/2009  . TOBACCO ABUSE 03/17/2009  . DECREASED HEARING, RIGHT EAR 10/27/2008  . BACK PAIN, LUMBAR 06/06/2007  . HOT FLASHES 07/18/2006    Past Surgical History:  Procedure Laterality Date  . ABDOMINAL HYSTERECTOMY    . BREAST LUMPECTOMY Right   . BREAST LUMPECTOMY WITH RADIOACTIVE SEED AND SENTINEL LYMPH NODE BIOPSY Right 07/24/2017   Procedure: RIGHT BREAST LUMPECTOMY WITH RADIOACTIVE SEED AND SENTINEL LYMPH NODE BIOPSY;  Surgeon: Erroll Luna, MD;  Location: [facility];  Service: General;  Laterality: Right;  . PORT-A-CATH REMOVAL Right 06/18/2019   Procedure: REMOVAL PORT-A-CATH;  Surgeon: Erroll Luna, MD;  Location: [facility];  Service: General;  Laterality: Right;  . PORTACATH PLACEMENT Right 07/24/2017   Procedure: INSERTION PORT-A-CATH;  Surgeon: Erroll Luna, MD;  Location: [facility];  Service: General;  Laterality: Right;  . TUBAL LIGATION  1993  . VAGINAL HYSTERECTOMY Bilateral 12/19/2016   Procedure: HYSTERECTOMY VAGINAL;  Surgeon: Lavonia Drafts, MD;  Location: [facility];  Service: Gynecology;  Laterality: Bilateral;  .  WISDOM TOOTH EXTRACTION       OB History    Gravida  2   Para  2   Term  1   Preterm  1   AB      Living  2     SAB      IAB      Ectopic      Multiple      Live Births              Family History  Problem Relation Age of Onset  . Migraines Daughter   . Hypertension Mother   . Heart disease Mother   . Hypertension Father   . Heart disease Father   . Migraines Son   . Hypertension Other    . Colon cancer Neg Hx   . Colon polyps Neg Hx   . Diabetes Neg Hx   . Kidney disease Neg Hx   . Liver disease Neg Hx   . Heart attack Neg Hx   . Stroke Neg Hx     Social History   Tobacco Use  . Smoking status: Former Smoker    Packs/day: 0.50    Types: Cigarettes    Quit date: 07/16/2017    Years since quitting: 3.5  . Smokeless tobacco: Never Used  . Tobacco comment: smokes 1cigarette per day and more if she is stressed  Vaping Use  . Vaping Use: Never used  Substance Use Topics  . Alcohol use: No    Alcohol/week: 0.0 standard drinks  . Drug use: Yes    Types: Marijuana    Comment: occasional, for pain. states she hasnt used for a mont; 08/02/17 sometimes    Home Medications Prior to Admission medications   Medication Sig Start Date End Date Taking? Authorizing Provider  hydrocortisone cream 1 % Apply to affected area 2 times daily 02/17/21  Yes Valta Dillon S, PA-C  predniSONE (STERAPRED UNI-PAK 21 TAB) 10 MG (21) TBPK tablet Take by mouth daily. Take 6 tabs by mouth daily  for 2 days, then 5 tabs for 2 days, then 4 tabs for 2 days, then 3 tabs for 2 days, 2 tabs for 2 days, then 1 tab by mouth daily for 2 days 02/17/21  Yes Primitivo Merkey S, PA-C  albuterol (PROVENTIL HFA;VENTOLIN HFA) 108 (90 Base) MCG/ACT inhaler Inhale 2 puffs into the lungs every 6 (six) hours as needed for wheezing or shortness of breath.    [provider]  DULoxetine (CYMBALTA) 30 MG capsule TAKE 1 CAPSULE (30 MG TOTAL) BY MOUTH DAILY. X 1 WEEK THEN TAKE 2 CAPSULES DAILY Patient taking differently: Take 60 mg by mouth daily. 12/24/19   Gardenia Phlegm, NP  esomeprazole (NEXIUM) 40 MG capsule Take 40 mg by mouth daily before breakfast.  04/28/19   [provider]  ibuprofen (ADVIL) 600 MG tablet Take 1 tablet (600 mg total) by mouth every 6 (six) hours as needed for mild pain. 12/03/20   Nicholas Lose, MD  SYMBICORT 80-4.5 MCG/ACT inhaler Inhale 2 puffs into the lungs 3  (three) times daily. 11/14/19   [provider]    Allergies    Shellfish allergy, Tea, Penicillins, and Tomato  Review of Systems   Review of Systems  Constitutional: Negative for fever.  HENT: Negative for facial swelling and trouble swallowing.   Respiratory: Negative for cough, shortness of breath and wheezing.   Skin: Positive for rash.    Physical Exam Updated Vital Signs BP 111/81 (BP  Location: Left Arm)   Pulse (!) 58   Temp 98.3 F (36.8 C) (Oral)   Resp 16   LMP 11/13/2016   SpO2 100%   Physical Exam Vitals and nursing note reviewed.  Constitutional:      General: She is not in acute distress.    Appearance: She is well-developed.  HENT:     Head: Normocephalic and atraumatic.  Eyes:     Conjunctiva/sclera: Conjunctivae normal.  Cardiovascular:     Rate and Rhythm: Normal rate.  Pulmonary:     Effort: Pulmonary effort is normal.  Musculoskeletal:        General: Normal range of motion.     Cervical back: Neck supple.     Comments: Erythematous maculopapular rash to the lower back  Skin:    General: Skin is warm and dry.  Neurological:     Mental Status: She is alert.     ED Results / Procedures / Treatments   Labs (all labs ordered are listed, but only abnormal results are displayed) Labs Reviewed - No data to display  EKG None  Radiology No results found.  Procedures Procedures   Medications Ordered in ED Medications - No data to display  ED Course  I have reviewed the triage vital signs and the nursing notes.  Pertinent labs & imaging results that were available during my care of the patient were reviewed by me and considered in my medical decision making (see chart for details).    MDM Rules/Calculators/A&P                          Rash consistent with dermatitis. Patient denies any difficulty breathing or swallowing.  Pt has a patent airway without stridor and is handling secretions without difficulty; no angioedema. No  blisters, no pustules, no warmth, no draining sinus tracts, no superficial abscesses, no bullous impetigo, no vesicles, no desquamation, no target lesions with dusky purpura or a central bulla. Not tender to touch. No concern for superimposed infection. No concern for SJS, TEN, TSS, tick borne illness, syphilis or other life-threatening condition. Will discharge home with short course of steroid and recommend Benadryl as needed for pruritis.    Final Clinical Impression(s) / ED Diagnoses Final diagnoses:  Rash    Rx / DC Orders ED Discharge Orders         Ordered    predniSONE (STERAPRED UNI-PAK 21 TAB) 10 MG (21) TBPK tablet  Daily        02/17/21 1509    hydrocortisone cream 1 %        02/17/21 1509           Danise Dehne S, PA-C 02/17/21 1509    Carmin Muskrat, MD 02/17/21 1538

## 2021-02-17 NOTE — Discharge Instructions (Signed)
Take prednisone as directed. Use the hydrocortisone cream as directed. You may also use over the counter benadryl for the itching.   Please follow up with your primary care provider within 5-7 days for re-evaluation of your symptoms. If you do not have a primary care provider, information for a healthcare clinic has been provided for you to make arrangements for follow up care. Please return to the emergency department for any new or worsening symptoms.

## 2021-02-17 NOTE — ED Triage Notes (Signed)
Pt reports possible allergic reaction, has rash and itching to her back. Airway intact.

## 2021-03-08 NOTE — Progress Notes (Signed)
Carelink Summary Report / Loop Recorder 

## 2021-03-14 ENCOUNTER — Ambulatory Visit (INDEPENDENT_AMBULATORY_CARE_PROVIDER_SITE_OTHER): Payer: Medicaid Other

## 2021-03-14 DIAGNOSIS — R002 Palpitations: Secondary | ICD-10-CM

## 2021-03-15 LAB — CUP PACEART REMOTE DEVICE CHECK
Date Time Interrogation Session: 20220616015720
Implantable Pulse Generator Implant Date: 20210608

## 2021-04-04 NOTE — Progress Notes (Signed)
Carelink Summary Report / Loop Recorder 

## 2021-04-13 NOTE — Addendum Note (Signed)
Addended by: Douglass Rivers D on: 04/13/2021 09:11 AM   Modules accepted: Level of Service

## 2021-04-14 LAB — CUP PACEART REMOTE DEVICE CHECK
Date Time Interrogation Session: 20220719015948
Implantable Pulse Generator Implant Date: 20210608

## 2021-04-18 ENCOUNTER — Ambulatory Visit (INDEPENDENT_AMBULATORY_CARE_PROVIDER_SITE_OTHER): Payer: Medicaid Other

## 2021-04-18 DIAGNOSIS — I471 Supraventricular tachycardia: Secondary | ICD-10-CM

## 2021-04-27 ENCOUNTER — Other Ambulatory Visit: Payer: Self-pay | Admitting: Internal Medicine

## 2021-05-13 ENCOUNTER — Other Ambulatory Visit: Payer: Self-pay | Admitting: Internal Medicine

## 2021-05-13 NOTE — Progress Notes (Signed)
Carelink Summary Report / Loop Recorder 

## 2021-05-23 ENCOUNTER — Ambulatory Visit (INDEPENDENT_AMBULATORY_CARE_PROVIDER_SITE_OTHER): Payer: Medicaid Other

## 2021-05-23 DIAGNOSIS — I471 Supraventricular tachycardia: Secondary | ICD-10-CM

## 2021-05-23 LAB — CUP PACEART REMOTE DEVICE CHECK
Date Time Interrogation Session: 20220821020408
Implantable Pulse Generator Implant Date: 20210608

## 2021-06-03 NOTE — Progress Notes (Signed)
Carelink Summary Report / Loop Recorder 

## 2021-06-17 ENCOUNTER — Other Ambulatory Visit: Payer: Self-pay

## 2021-06-17 ENCOUNTER — Ambulatory Visit (INDEPENDENT_AMBULATORY_CARE_PROVIDER_SITE_OTHER): Payer: Medicaid Other | Admitting: Internal Medicine

## 2021-06-17 VITALS — BP 100/60 | HR 82 | Ht 65.0 in | Wt 155.0 lb

## 2021-06-17 DIAGNOSIS — R002 Palpitations: Secondary | ICD-10-CM

## 2021-06-17 NOTE — Progress Notes (Signed)
HPI Katherine [name] returns today for followup of her palpitations. The etiology is unclear. She tried to wear a cardiac monitor but stopped after a couple of days due to skin irritation.  She has preserved LV function. She presents today after undergoing ILR insertion just over a year ago. She has had some bradycardia but has not been symptomatic. She notes that her palpitations are improved. No chest pain or sob.  Allergies  Allergen Reactions   Shellfish Allergy Anaphylaxis   Tea Nausea And Vomiting   Penicillins Other (See Comments)    Yeast infection  Has patient had a PCN reaction causing immediate rash, facial/tongue/throat swelling, SOB or lightheadedness with hypotension: no Has patient had a PCN reaction causing severe rash involving mucus membranes or skin necrosis: no Has patient had a PCN reaction that required hospitalization no Has patient had a PCN reaction occurring within the last 10 years:no If all of the above answers are "NO", then may proceed with Cephalosporin use.    Tomato Rash     Current Outpatient Medications  Medication Sig Dispense Refill   albuterol (PROVENTIL HFA;VENTOLIN HFA) 108 (90 Base) MCG/ACT inhaler Inhale 2 puffs into the lungs every 6 (six) hours as needed for wheezing or shortness of breath.     DULoxetine (CYMBALTA) 30 MG capsule TAKE 1 CAPSULE (30 MG TOTAL) BY MOUTH DAILY. X 1 WEEK THEN TAKE 2 CAPSULES DAILY (Patient taking differently: Take 60 mg by mouth daily.) 60 capsule 5   esomeprazole (NEXIUM) 40 MG capsule Take 40 mg by mouth daily before breakfast.      hydrocortisone cream 1 % Apply to affected area 2 times daily 15 g 0   predniSONE (STERAPRED UNI-PAK 21 TAB) 10 MG (21) TBPK tablet Take by mouth daily. Take 6 tabs by mouth daily  for 2 days, then 5 tabs for 2 days, then 4 tabs for 2 days, then 3 tabs for 2 days, 2 tabs for 2 days, then 1 tab by mouth daily for 2 days 42 tablet 0   SYMBICORT 80-4.5 MCG/ACT inhaler Inhale 2 puffs into  the lungs 3 (three) times daily.     ibuprofen (ADVIL) 600 MG tablet Take 1 tablet (600 mg total) by mouth every 6 (six) hours as needed for mild pain. (Patient not taking: Reported on 06/17/2021) 60 tablet 3   No current facility-administered medications for this visit.     Past Medical History:  Diagnosis Date   Anxiety    Asthma    Dysrhythmia    History of SVT-No medications   Fibromyalgia    GERD (gastroesophageal reflux disease)    History of radiation therapy 12/19/17- 01/15/18   Right Breast, 2.67 Gy in 15 fractions for a total dose of 40.05 Gy. Boost, 2 Gy in 5 fractions for a total dose of 10 Gy   Hot flashes    Malignant neoplasm of upper-outer quadrant of right female breast ([place]) 06/2017   right breast   Migraine    Palpitations    Pneumonia    with cavitation of left lower lobe   SVT (supraventricular tachycardia) ([place])    Zio Monitor 11/2019: Patient only wore for 3 day (adhesive allergy); NSR, Avg HR 65, no arrhythmia   Tuberculosis    exposure as a child, get checked frequently   Uterine fibroid     ROS:   All systems reviewed and negative except as noted in the HPI.   Past Surgical History:  Procedure Laterality  Date   ABDOMINAL HYSTERECTOMY     BREAST LUMPECTOMY Right    BREAST LUMPECTOMY WITH RADIOACTIVE SEED AND SENTINEL LYMPH NODE BIOPSY Right 07/24/2017   Procedure: RIGHT BREAST LUMPECTOMY WITH RADIOACTIVE SEED AND SENTINEL LYMPH NODE BIOPSY;  Surgeon: Erroll Luna, MD;  Location: [facility];  Service: General;  Laterality: Right;   PORT-A-CATH REMOVAL Right 06/18/2019   Procedure: REMOVAL PORT-A-CATH;  Surgeon: Erroll Luna, MD;  Location: [facility];  Service: General;  Laterality: Right;   PORTACATH PLACEMENT Right 07/24/2017   Procedure: INSERTION PORT-A-CATH;  Surgeon: Erroll Luna, MD;  Location: [facility];  Service: General;  Laterality: Right;   [place]  Bilateral 12/19/2016   Procedure: HYSTERECTOMY VAGINAL;  Surgeon: Lavonia Drafts, MD;  Location: [facility];  Service: Gynecology;  Laterality: Bilateral;   WISDOM TOOTH EXTRACTION       Family History  Problem Relation Age of Onset   Migraines Daughter    Hypertension Mother    Heart disease Mother    Hypertension Father    Heart disease Father    Migraines Son    Hypertension Other    Colon cancer Neg Hx    Colon polyps Neg Hx    Diabetes Neg Hx    Kidney disease Neg Hx    Liver disease Neg Hx    Heart attack Neg Hx    Stroke Neg Hx      Social History   Socioeconomic History   Marital status: Married    Spouse name: Not on file   Number of children: 2   Years of education: 15   Highest education level: Not on file  Occupational History   Occupation: unemployed  Tobacco Use   Smoking status: Former    Packs/day: 0.50    Types: Cigarettes    Quit date: 07/16/2017    Years since quitting: 3.9   Smokeless tobacco: Never   Tobacco comments:    smokes 1cigarette per day and more if she is stressed  Vaping Use   Vaping Use: Never used  Substance and Sexual Activity   Alcohol use: No    Alcohol/week: 0.0 standard drinks   Drug use: Yes    Types: Marijuana    Comment: occasional, for pain. states she hasnt used for a mont; 08/02/17 sometimes   Sexual activity: Yes    Birth control/protection: Surgical  Other Topics Concern   Not on file  Social History Narrative   On disability for heart condition.  Pt. Was non-specific.       Patient does not drink caffeine.   Patient is ambidextrous, but mostly right handed.    12th grade, some college courses   Social Determinants of Health   Financial Resource Strain: Not on file  Food Insecurity: Not on file  Transportation Needs: Not on file  Physical Activity: Not on file  Stress: Not on file  Social Connections: Not on file  Intimate Partner Violence: Not on file     BP 100/60   Pulse 82   Ht 5\' 5"   (1.651 m)   Wt 155 lb (70.3 kg)   LMP 11/13/2016   SpO2 96%   BMI 25.79 kg/m   Physical Exam:  Well appearing NAD HEENT: Unremarkable Neck:  No JVD, no thyromegally Lymphatics:  No adenopathy Back:  No CVA tenderness Lungs:  Clear with no wheezes HEART:  Regular rate rhythm, no murmurs, no rubs, no clicks Abd:  soft, positive bowel sounds, no organomegally, no rebound, no guarding Ext:  2 plus pulses, no edema, no cyanosis, no clubbing Skin:  No rashes no nodules Neuro:  CN II through XII intact, motor grossly intact   DEVICE  Normal device function.  See PaceArt for details.   Assess/Plan:  1. Palpitations - the etiology is unclear. Her symptoms have improved. 2. HTN - her bp is well controlled.  3. ILR - her device is working normally. No evidenc of symptomatic brady.  Carleene Overlie Shatonia Hoots,MD

## 2021-06-17 NOTE — Patient Instructions (Signed)
Medication Instructions:  Your physician recommends that you continue on your current medications as directed. Please refer to the Current Medication list given to you today.  Labwork: None ordered.  Testing/Procedures: None ordered.  Follow-Up: Your physician wants you to follow-up in: one year with Cristopher Peru, MD or one of the following Advanced Practice Providers on your designated Care Team:   Tommye Standard, [place] Legrand Como "Jonni Sanger" Chalmers Cater, [place]  Remote monthly monitoring is used to monitor your loop recorder from home.   Any Other Special Instructions Will Be Listed Below (If Applicable).  If you need a refill on your cardiac medications before your next appointment, please call your pharmacy.

## 2021-06-21 ENCOUNTER — Other Ambulatory Visit: Payer: Self-pay | Admitting: Internal Medicine

## 2021-06-22 LAB — CUP PACEART REMOTE DEVICE CHECK
Date Time Interrogation Session: 20220923020851
Implantable Pulse Generator Implant Date: 20210608

## 2021-06-23 ENCOUNTER — Ambulatory Visit (INDEPENDENT_AMBULATORY_CARE_PROVIDER_SITE_OTHER): Payer: Medicaid Other

## 2021-06-23 DIAGNOSIS — R002 Palpitations: Secondary | ICD-10-CM | POA: Diagnosis not present

## 2021-07-01 NOTE — Progress Notes (Signed)
Carelink Summary Report / Loop Recorder 

## 2021-07-20 LAB — CUP PACEART REMOTE DEVICE CHECK
Date Time Interrogation Session: 20221026021234
Implantable Pulse Generator Implant Date: 20210608

## 2021-07-26 ENCOUNTER — Ambulatory Visit (INDEPENDENT_AMBULATORY_CARE_PROVIDER_SITE_OTHER): Payer: Medicaid Other

## 2021-07-26 DIAGNOSIS — R002 Palpitations: Secondary | ICD-10-CM | POA: Diagnosis not present

## 2021-07-27 ENCOUNTER — Other Ambulatory Visit: Payer: Self-pay | Admitting: Internal Medicine

## 2021-08-02 NOTE — Progress Notes (Signed)
Carelink Summary Report / Loop Recorder 

## 2021-08-15 ENCOUNTER — Other Ambulatory Visit: Payer: Self-pay | Admitting: Nurse Practitioner

## 2021-08-15 DIAGNOSIS — N63 Unspecified lump in unspecified breast: Secondary | ICD-10-CM

## 2021-08-22 ENCOUNTER — Ambulatory Visit (INDEPENDENT_AMBULATORY_CARE_PROVIDER_SITE_OTHER): Payer: Self-pay

## 2021-08-22 DIAGNOSIS — I471 Supraventricular tachycardia: Secondary | ICD-10-CM

## 2021-08-23 LAB — CUP PACEART REMOTE DEVICE CHECK
Date Time Interrogation Session: 20221129104822
Implantable Pulse Generator Implant Date: 20210608

## 2021-08-30 ENCOUNTER — Other Ambulatory Visit: Payer: Self-pay

## 2021-08-30 ENCOUNTER — Encounter: Payer: Self-pay | Admitting: Physical Therapy

## 2021-08-30 ENCOUNTER — Ambulatory Visit: Payer: Medicaid Other | Attending: Family Medicine | Admitting: Physical Therapy

## 2021-08-30 DIAGNOSIS — M5441 Lumbago with sciatica, right side: Secondary | ICD-10-CM | POA: Insufficient documentation

## 2021-08-30 DIAGNOSIS — M5442 Lumbago with sciatica, left side: Secondary | ICD-10-CM | POA: Insufficient documentation

## 2021-08-30 DIAGNOSIS — G8929 Other chronic pain: Secondary | ICD-10-CM | POA: Diagnosis present

## 2021-08-30 DIAGNOSIS — R293 Abnormal posture: Secondary | ICD-10-CM | POA: Diagnosis not present

## 2021-08-30 NOTE — Patient Instructions (Signed)
Access Code [email or web address] Access Code: [email or web address] URL: [email or web address] Date: 08/30/2021 Prepared by: Raeford Razor  Exercises Cat Cow to Child's Pose - 2 x daily - 7 x weekly - 1 sets - 5 reps - 30 hold Supine Lower Trunk Rotation - 2 x daily - 7 x weekly - 2 sets - 10 reps - 10 hold Supine Single Knee to Chest Stretch - 2 x daily - 7 x weekly - 1 sets - 5 reps - 30 hold Supine Hamstring Stretch with Strap - 2 x daily - 7 x weekly - 1 sets - 5 reps - 30 hold

## 2021-08-30 NOTE — Therapy (Addendum)
[place], [place], [address] Phone: [phone]   Fax:  [phone]  Physical Therapy Evaluation  Patient Details  Name: Katherine [name] MRN: [id number] Date of Birth: 09/13/1971 Referring Provider (PT): Dr.  Vanita Panda   Encounter Date: 08/30/2021   PT End of Session - 08/30/21 1639     Visit Number 1    Number of Visits 8    Date for PT Re-Evaluation 10/25/21    Authorization Type [place] [employer]    PT Start Time 0258    PT Stop Time 1100    PT Time Calculation (min) 45 min    Activity Tolerance Patient tolerated treatment well    Behavior During Therapy [facility] for tasks assessed/performed             Past Medical History:  Diagnosis Date   Anxiety    Asthma    Dysrhythmia    History of SVT-No medications   Fibromyalgia    GERD (gastroesophageal reflux disease)    History of radiation therapy 12/19/17- 01/15/18   Right Breast, 2.67 Gy in 15 fractions for a total dose of 40.05 Gy. 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   right breast   Migraine    Palpitations    Pneumonia    with cavitation of left lower lobe   SVT (supraventricular tachycardia) ([place])    Zio Monitor 11/2019: Patient only wore for 3 day (adhesive allergy); NSR, Avg HR 65, no arrhythmia   Tuberculosis    exposure as a child, get checked frequently   Uterine fibroid     Past Surgical History:  Procedure Laterality Date   ABDOMINAL HYSTERECTOMY     BREAST LUMPECTOMY Right    BREAST LUMPECTOMY WITH RADIOACTIVE SEED AND SENTINEL LYMPH NODE BIOPSY Right 07/24/2017   Procedure: RIGHT BREAST LUMPECTOMY WITH RADIOACTIVE SEED AND SENTINEL LYMPH NODE BIOPSY;  Surgeon: Erroll Luna, MD;  Location: [place];  Service: General;  Laterality: Right;   PORT-A-CATH REMOVAL Right 06/18/2019    Procedure: REMOVAL PORT-A-CATH;  Surgeon: Erroll Luna, MD;  Location: [place];  Service: General;  Laterality: Right;   PORTACATH PLACEMENT Right 07/24/2017   Procedure: INSERTION PORT-A-CATH;  Surgeon: Erroll Luna, MD;  Location: [place];  Service: General;  Laterality: Right;   [place] Bilateral 12/19/2016   Procedure: HYSTERECTOMY VAGINAL;  Surgeon: Lavonia Drafts, MD;  Location: [facility];  Service: Gynecology;  Laterality: Bilateral;   WISDOM TOOTH EXTRACTION      There were no vitals filed for this visit.    Subjective Assessment - 08/30/21 1020     Subjective Patient presents with chronic low back pain (2010) which has recently become more severe over the past 3 mos.  She complains of difficulty bending, lifting, walking.  She reports low back pain and radiates into LLE to her foot.  Her feet go numb about 3-4 times per week.  She feels weakness in her LLE.  She also complaints of neck and knee pain.  She has popping in her spine. She fell last year (L knee buckled) and she thinks she hit a bone in her back.    Patient is accompained by: Family member    Pertinent History neck pain, L knee pain ,breast CA  with radiation, SVT    Limitations Standing;Lifting;Walking;House hold activities    How long can you walk comfortably? 5-10 min .  Has to rest once she gets to the building, back and LLE pain    Diagnostic tests None    Patient Stated Goals Patient wants to feel better and move around like I used to.    Currently in Pain? Yes    Pain Score 9     Pain Location Back    Pain Orientation Right;Left    Pain Descriptors / Indicators Sharp;Aching;Sore    Pain Type Chronic pain    Pain Radiating Towards LLE    Pain Onset More than a month ago    Pain Frequency Constant    Aggravating Factors  bending, standing, walking, lifting    Pain Relieving Factors laying down, ibuprofen    Effect of Pain on  Daily Activities limits her comfort at home and ability to be active    Multiple Pain Sites No                [facility] PT Assessment - 08/30/21 0001       Assessment   Medical Diagnosis low back pain    Referring Provider (PT) Dr.  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 , Brewing technologist   Overall Cognitive Status Within Functional Limits for tasks assessed      Observation/Other Assessments   Focus on Therapeutic Outcomes (FOTO)  NT due to MCD      Sensation   Light Touch Appears Intact      Coordination   Gross Motor Movements are Fluid and Coordinated Not tested      Posture/Postural Control   Posture/Postural Control Postural limitations    Postural Limitations Anterior pelvic tilt;Forward head;Rounded Shoulders      AROM   Lumbar Flexion WNL    Lumbar Extension WNL    Lumbar - Right Side Bend pain on R , 25%    Lumbar - Left Side Bend pain on Rt, 50%    Lumbar - Right Rotation WNL    Lumbar - Left Rotation WNL      Strength   Right Hip Flexion 4-/5    Left Hip Flexion 4-/5    Right Knee Flexion 4+/5    Right Knee Extension 4+/5    Left Knee Flexion 4+/5    Left Knee Extension 4+/5    Right Ankle Dorsiflexion 4+/5    Left Ankle Dorsiflexion 4+/5      Palpation   Spinal mobility painful,but normal segmental movement    Palpation comment  pain Rt L5 and across superior glutes, pain in bilateral lumbar paraspinals  Special Tests    Special Tests Lumbar    Lumbar Tests Straight Leg Raise      Straight Leg Raise   Findings Positive    Side  Right                        Objective measurements completed on examination: See above findings.                     PT Long Term Goals - 08/30/21 1642       PT LONG TERM GOAL #1   Title Pt will be I with HEP for flexibility and strength upon discharge    Baseline unknown    Time 6    Period Weeks    Status New    Target Date 10/11/21      PT LONG TERM GOAL #2   Title Pt will be able to walk in the community with back pain < 5/10 for up to 15-20 min at a time.    Baseline can be severe with 5-10 min    Time 6    Period Weeks    Status New    Target Date 10/11/21      PT LONG TERM GOAL #3   Title Pt will be able to show proper lifting and squatting mechanics for light items in her home    Time 6    Period Weeks    Status New    Target Date 10/11/21      PT LONG TERM GOAL #4   Title Pt will be able to report less radicular sx, numbness in feet, legs to no more than 1 x per week    Time 6    Period Weeks    Status New    Target Date 10/11/21                    Plan - 08/30/21 1639     Clinical Impression Statement Patient presents for mod complexity eval of low back pain which is chronic, becoming more constant over the past year.  She is limited functionally in her abiliy to transfer, walk, bend and squat due to low back pain which progresses into her L>Rt LE . Today, her low back was more painful in Rt low lumbar but confirms that the radicular sx are on the L side.  She does exhibit weakness in abdominal wall and standswith increased lordosis.  She has difficulty walking and standing but does find relief with resting in prone as well as standing extension.  She was given basic trunk mobility exercises today, will get  more targeted next visit for core and body mechanics.    Personal Factors and Comorbidities Time since onset of injury/illness/exacerbation;Past/Current Experience;Comorbidity 3+;Social Background    Comorbidities cardiac, cancer, chronic pain    Examination-Activity Limitations Stairs;Stand;Bend;Transfers;Sleep;Lift;Locomotion Level;Squat;Bed Mobility    Examination-Participation Restrictions Cleaning;Community Activity;Interpersonal Relationship;Meal Prep;Shop    Stability/Clinical Decision Making Evolving/Moderate complexity    Clinical Decision Making Moderate    Rehab Potential Good    PT Frequency 1x / week    PT Duration 8 weeks    PT Treatment/Interventions ADLs/Self Care Home Management;Therapeutic exercise;Cryotherapy;Moist Heat;Ultrasound;Balance training;Patient/family education;Manual techniques;Functional mobility training;Spinal Manipulations    PT Next Visit Plan check HEP, begin basic core    PT Home Exercise Plan Access Code [email or web address]    Consulted and Agree with Plan of Care Patient;Family member/caregiver    Family Member Consulted  husband             Patient will benefit from skilled therapeutic intervention in order to improve the following deficits and impairments:  Decreased strength, Increased fascial restricitons, Impaired flexibility, Difficulty walking, Decreased mobility, Postural dysfunction, Improper body mechanics  Visit Diagnosis: Abnormal posture  Chronic low back pain with bilateral sciatica, unspecified back pain laterality     Problem List Patient Active Problem List   Diagnosis Date Noted   Chemotherapy-induced peripheral neuropathy ([place]) 12/17/2017   Port-A-Cath in place 09/24/2017   Malignant neoplasm of upper-outer quadrant of right breast in female, estrogen receptor negative ([place]) 07/03/2017   Pelvic pain in female 12/21/2016   Fibroids, intramural 12/21/2016   Post-operative state 12/19/2016   Nocturia 08/09/2015   Fibromyalgia  06/25/2015   GERD (gastroesophageal reflux disease) 06/16/2013   Migraine 06/16/2013   Atrial tachycardia, paroxysmal ([place]) 03/31/2013   Headache 12/20/2012   Syncope 06/20/2012   Fibroids 03/26/2012   Family history of breast cancer 01/22/2012   Seasonal and perennial allergic rhinitis 07/18/2010   HAIR LOSS 07/04/2010   ANXIETY DISORDER, SITUATIONAL, MILD 11/17/2009   Palpitations 10/12/2009   CHEST PAIN, LEFT 10/12/2009   DYSURIA 08/26/2009   MAMMOGRAM, ABNORMAL, RIGHT, HX OF 04/09/2009   TOBACCO ABUSE 03/17/2009   DECREASED HEARING, RIGHT EAR 10/27/2008   BACK PAIN, LUMBAR 06/06/2007   HOT FLASHES 07/18/2006    Janei Scheff, PT 08/30/2021, 4:55 PM  [facility] [address] [place], [place], [address] Phone: [phone]   Fax:  [phone]  Name: Katherine [name] MRN: [id number] Date of Birth: 10/22/1970  Raeford Razor, PT 08/30/21 4:56 PM Phone: [phone] Fax: [phone]   Check all possible CPT codes: 97110- Therapeutic Exercise, [phone]- Neuro Re-education, [phone] - Gait Training, 97140 - Manual Therapy, 16553 - Therapeutic Activities, 74827 - [place], [address] - Electrical stimulation (unattended), [id number] - Physical performance training, and [id number] - Aquatic therapy

## 2021-08-31 NOTE — Progress Notes (Signed)
Carelink Summary Report / Loop Recorder 

## 2021-09-02 NOTE — Addendum Note (Signed)
Addended by: Douglass Rivers D on: 09/02/2021 09:15 AM   Modules accepted: Level of Service

## 2021-09-04 ENCOUNTER — Emergency Department (HOSPITAL_COMMUNITY)
Admission: EM | Admit: 2021-09-04 | Discharge: 2021-09-04 | Disposition: A | Discharge status: Home / Self Care | Payer: Medicaid Other | Attending: Emergency Medicine | Admitting: Emergency Medicine

## 2021-09-04 ENCOUNTER — Other Ambulatory Visit: Payer: Self-pay

## 2021-09-04 ENCOUNTER — Encounter (HOSPITAL_COMMUNITY): Payer: Self-pay | Admitting: Emergency Medicine

## 2021-09-04 DIAGNOSIS — Z7951 Long term (current) use of inhaled steroids: Secondary | ICD-10-CM | POA: Insufficient documentation

## 2021-09-04 DIAGNOSIS — J45909 Unspecified asthma, uncomplicated: Secondary | ICD-10-CM | POA: Diagnosis not present

## 2021-09-04 DIAGNOSIS — R059 Cough, unspecified: Secondary | ICD-10-CM | POA: Diagnosis present

## 2021-09-04 DIAGNOSIS — Z87891 Personal history of nicotine dependence: Secondary | ICD-10-CM | POA: Insufficient documentation

## 2021-09-04 DIAGNOSIS — U071 COVID-19: Secondary | ICD-10-CM | POA: Diagnosis not present

## 2021-09-04 LAB — RESP PANEL BY RT-PCR (FLU A&B, COVID) ARPGX2
Influenza A by PCR: NEGATIVE
Influenza B by PCR: NEGATIVE
SARS Coronavirus 2 by RT PCR: POSITIVE — AB

## 2021-09-04 MED ORDER — ACETAMINOPHEN 325 MG PO TABS
650.0000 mg | ORAL_TABLET | Freq: Once | ORAL | Status: AC
  Administered 2021-09-04: 650 mg via ORAL
  Filled 2021-09-04: qty 2

## 2021-09-04 MED ORDER — FLUTICASONE PROPIONATE 50 MCG/ACT NA SUSP: 2.0000 | Freq: Every day | NASAL | 0 refills | Status: DC

## 2021-09-04 MED ORDER — BENZONATATE 100 MG PO CAPS: 100.0000 mg | ORAL_CAPSULE | Freq: Three times a day (TID) | ORAL | 0 refills | Status: DC

## 2021-09-04 NOTE — ED Triage Notes (Signed)
C/o fever, cough, sneezing, headache, and body aches since yesterday.  Took Tylenol Cold and Head yesterday.

## 2021-09-04 NOTE — ED Provider Notes (Addendum)
Emergency Medicine Provider Triage Evaluation Note  Katherine [name] , a 50 y.o. female  was evaluated in triage.  Pt complains of fever, cough, headache and body aches since yesterday.  She states that yesterday she was running fever of 100.4.  States that she took Tylenol Cold and flu which brought down her fever however returned at 100.6.  She states that she has had coughing.  States that she now has chest soreness when she is coughing.  She continues to endorse headache and body aches.  She states she has nausea without vomiting.  Endorses diarrhea.  Still tolerating p.o. at home.  She does have history of asthma however she states she has not increased use of albuterol.  Review of Systems  Positive: See above Negative:   Physical Exam  BP (!) 120/93   Pulse 70   Temp 98.5 F (36.9 C) (Oral)   Resp 16   LMP 11/13/2016   SpO2 98%  Gen:   Awake, no distress   Resp:  Normal effort, lungs clear to auscultation bilaterally.  No wheezing noted. MSK:   Moves extremities without difficulty  Other:  S1/S2 without murmur.  Pharynx is clear with mild erythema.  Congestion noted during exam.  No sinus pain or pressure.  Medical Decision Making  Medically screening exam initiated at 8:33 AM.  Appropriate orders placed.  [name] was informed that the remainder of the evaluation will be completed by another provider, this initial triage assessment does not replace that evaluation, and the importance of remaining in the ED until their evaluation is complete.     Mickie Hillier, PA-C 09/04/21 0834    Mickie Hillier, PA-C 09/04/21 1109    Gareth Morgan, MD 09/05/21 [phone]

## 2021-09-04 NOTE — Discharge Instructions (Signed)
COVID test positive  Take your medications as prescribed  Return for new or worsening symptoms

## 2021-09-04 NOTE — ED Provider Notes (Signed)
[facility] EMERGENCY DEPARTMENT Provider Note   CSN: [id number] Arrival date & time: 09/04/21  2446     History Chief Complaint  Patient presents with   Nasal Congestion   Cough    LETISIA SCHWALB is a 50 y.o. female with medical history significant for SVT presents for evaluation of feeling unwell.  Patient states she had subjective fevers, cough, myalgias since yesterday.  Took Tylenol Cold and flu.  Cough is nonproductive.  Has a mild headache, denies any recent trauma, sudden onset thunderclap headache.  Tolerating p.o. intake at home.  1 episode of loose stool without melena or blood per rectum.  Does have history of asthma however has not had to use her albuterol inhaler.  Has recently been in and out of the hospital with sick family members and a new grandbaby.  She is vaccinated for COVID.  No known sick contacts.  Denies vision changes, unilateral weakness, chest pain, shortness of breath, abdominal pain, diarrhea or dysuria.  Denies additional aggravating or relieving factors.  History obtained from patient and past medical records.  No interpreter used  HPI     Past Medical History:  Diagnosis Date   Anxiety    Asthma    Dysrhythmia    History of SVT-No medications   Fibromyalgia    GERD (gastroesophageal reflux disease)    History of radiation therapy 12/19/17- 01/15/18   Right Breast, 2.67 Gy in 15 fractions for a total dose of 40.05 Gy. 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   right breast   Migraine    Palpitations    Pneumonia    with cavitation of left lower lobe   SVT (supraventricular tachycardia) ([place])    Zio Monitor 11/2019: Patient only wore for 3 day (adhesive allergy); NSR, Avg HR 65, no arrhythmia   Tuberculosis    exposure as a child, get checked frequently   Uterine fibroid     Patient Active Problem List   Diagnosis  Date Noted   Chemotherapy-induced peripheral neuropathy ([place]) 12/17/2017   Port-A-Cath in place 09/24/2017   Malignant neoplasm of upper-outer quadrant of right breast in female, estrogen receptor negative ([place]) 07/03/2017   Pelvic pain in female 12/21/2016   Fibroids, intramural 12/21/2016   Post-operative state 12/19/2016   Nocturia 08/09/2015   Fibromyalgia 06/25/2015   GERD (gastroesophageal reflux disease) 06/16/2013   Migraine 06/16/2013   Atrial tachycardia, paroxysmal ([place]) 03/31/2013   Headache 12/20/2012   Syncope 06/20/2012   Fibroids 03/26/2012   Family history of breast cancer 01/22/2012   Seasonal and perennial allergic rhinitis 07/18/2010   HAIR LOSS 07/04/2010   ANXIETY DISORDER, SITUATIONAL, MILD 11/17/2009   Palpitations 10/12/2009   CHEST PAIN, LEFT 10/12/2009   DYSURIA 08/26/2009   MAMMOGRAM, ABNORMAL, RIGHT, HX OF 04/09/2009   TOBACCO ABUSE 03/17/2009   DECREASED HEARING, RIGHT EAR 10/27/2008   BACK PAIN, LUMBAR 06/06/2007   HOT FLASHES 07/18/2006    Past Surgical History:  Procedure Laterality Date   ABDOMINAL HYSTERECTOMY     BREAST LUMPECTOMY Right    BREAST LUMPECTOMY WITH RADIOACTIVE SEED AND SENTINEL LYMPH NODE BIOPSY Right 07/24/2017   Procedure: RIGHT BREAST LUMPECTOMY WITH RADIOACTIVE SEED AND SENTINEL LYMPH NODE BIOPSY;  Surgeon: Erroll Luna, MD;  Location: [facility];  Service: General;  Laterality: Right;   PORT-A-CATH REMOVAL Right 06/18/2019  Procedure: REMOVAL PORT-A-CATH;  Surgeon: Erroll Luna, MD;  Location: [name];  Service: General;  Laterality: Right;   PORTACATH PLACEMENT Right 07/24/2017   Procedure: INSERTION PORT-A-CATH;  Surgeon: Erroll Luna, MD;  Location: [place];  Service: General;  Laterality: Right;   [place] Bilateral 12/19/2016   Procedure: HYSTERECTOMY VAGINAL;  Surgeon: Lavonia Drafts, MD;  Location: [facility];   Service: Gynecology;  Laterality: Bilateral;   WISDOM TOOTH EXTRACTION       OB History     Gravida  2   Para  2   Term  1   Preterm  1   AB      Living  2      SAB      IAB      Ectopic      Multiple      Live Births              Family History  Problem Relation Age of Onset   Migraines Daughter    Hypertension Mother    Heart disease Mother    Hypertension Father    Heart disease Father    Migraines Son    Hypertension Other    Colon cancer Neg Hx    Colon polyps Neg Hx    Diabetes Neg Hx    Kidney disease Neg Hx    Liver disease Neg Hx    Heart attack Neg Hx    Stroke Neg Hx     Social History   Tobacco Use   Smoking status: Former    Packs/day: 0.50    Types: Cigarettes    Quit date: 07/16/2017    Years since quitting: 4.1   Smokeless tobacco: Never   Tobacco comments:    smokes 1cigarette per day and more if she is stressed  Vaping Use   Vaping Use: Never used  Substance Use Topics   Alcohol use: No    Alcohol/week: 0.0 standard drinks   Drug use: Yes    Types: Marijuana    Comment: occasional, for pain. states she hasnt used for a mont; 08/02/17 sometimes    Home Medications Prior to Admission medications   Medication Sig Start Date End Date Taking? Authorizing Provider  benzonatate (TESSALON) 100 MG capsule Take 1 capsule (100 mg total) by mouth every 8 (eight) hours. 09/04/21  Yes Prentice Sackrider A, PA-C  fluticasone (FLONASE) 50 MCG/ACT nasal spray Place 2 sprays into both nostrils daily. 09/04/21  Yes Oddie Kuhlmann A, PA-C  albuterol (PROVENTIL HFA;VENTOLIN HFA) 108 (90 Base) MCG/ACT inhaler Inhale 2 puffs into the lungs every 6 (six) hours as needed for wheezing or shortness of breath.    [provider]  DULoxetine (CYMBALTA) 30 MG capsule TAKE 1 CAPSULE (30 MG TOTAL) BY MOUTH DAILY. X 1 WEEK THEN TAKE 2 CAPSULES DAILY Patient taking differently: Take 60 mg by mouth daily. 12/24/19   Gardenia Phlegm, NP   esomeprazole (NEXIUM) 40 MG capsule Take 40 mg by mouth daily before breakfast.  04/28/19   [provider]  hydrocortisone cream 1 % Apply to affected area 2 times daily 02/17/21   Couture, Cortni S, PA-C  ibuprofen (ADVIL) 600 MG tablet Take 1 tablet (600 mg total) by mouth every 6 (six) hours as needed for mild pain. 12/03/20   Nicholas Lose, MD  predniSONE (STERAPRED UNI-PAK 21 TAB) 10 MG (21) TBPK tablet Take by mouth daily. Take 6 tabs  by mouth daily  for 2 days, then 5 tabs for 2 days, then 4 tabs for 2 days, then 3 tabs for 2 days, 2 tabs for 2 days, then 1 tab by mouth daily for 2 days 02/17/21   Couture, Cortni S, PA-C  SYMBICORT 80-4.5 MCG/ACT inhaler Inhale 2 puffs into the lungs 3 (three) times daily. 11/14/19   [provider]    Allergies    Shellfish allergy, Tea, Penicillins, and Tomato  Review of Systems   Review of Systems  Constitutional:  Positive for activity change, appetite change, fatigue and fever.  HENT:  Positive for congestion, postnasal drip and rhinorrhea.   Respiratory:  Positive for cough.   Cardiovascular: Negative.   Gastrointestinal:  Positive for diarrhea. Negative for abdominal distention, abdominal pain, blood in stool and vomiting.  Genitourinary: Negative.   Musculoskeletal:  Positive for myalgias.  Skin: Negative.   Neurological:  Positive for headaches.  All other systems reviewed and are negative.  Physical Exam Updated Vital Signs BP (!) 120/93   Pulse 70   Temp 98.5 F (36.9 C) (Oral)   Resp 16   LMP 11/13/2016   SpO2 98%   Physical Exam Vitals and nursing note reviewed.  Constitutional:      General: She is not in acute distress.    Appearance: She is well-developed. She is not ill-appearing, toxic-appearing or diaphoretic.  HENT:     Head: Normocephalic and atraumatic.     Nose: Nose normal.     Mouth/Throat:     Mouth: Mucous membranes are moist.  Eyes:     Pupils: Pupils are equal, round, and reactive to  light.  Cardiovascular:     Rate and Rhythm: Normal rate.     Pulses: Normal pulses.     Heart sounds: Normal heart sounds.  Pulmonary:     Effort: Pulmonary effort is normal. No respiratory distress.     Breath sounds: Normal breath sounds.  Abdominal:     General: Bowel sounds are normal. There is no distension.     Palpations: Abdomen is soft.  Musculoskeletal:        General: No swelling, tenderness, deformity or signs of injury. Normal range of motion.     Cervical back: Normal range of motion.  Skin:    General: Skin is warm and dry.  Neurological:     General: No focal deficit present.     Mental Status: She is alert and oriented to person, place, and time.     Sensory: No sensory deficit.     Motor: No weakness.     Gait: Gait normal.  Psychiatric:        Mood and Affect: Mood normal.    ED Results / Procedures / Treatments   Labs (all labs ordered are listed, but only abnormal results are displayed) Labs Reviewed  RESP PANEL BY RT-PCR (FLU A&B, COVID) ARPGX2 - Abnormal; Notable for the following components:      Result Value   SARS Coronavirus 2 by RT PCR POSITIVE (*)    All other components within normal limits    EKG None  Radiology No results found.  Procedures Procedures   Medications Ordered in ED Medications  acetaminophen (TYLENOL) tablet 650 mg (650 mg Oral Given 09/04/21 3354)    ED Course  I have reviewed the triage vital signs and the nursing notes.  Pertinent labs & imaging results that were available during my care of the patient were reviewed by me and  considered in my medical decision making (see chart for details).  Pleasant, 50 year old here for evaluation of upper respiratory complaints which started yesterday.  She is afebrile, nonseptic, not ill-appearing.  Her heart and lungs are clear.  Her p.o. is clear.  She is tolerating p.o. intake at home.  Her abdomen is soft, nontender.  She is ambulatory.  She is without tachycardia,  tachypnea or hypoxia.  COVID test here is positive.  This is likely the cause of her symptoms.  Will DC home with symptomatic management.  She will return here for any worsening symptoms.  Agreeable for close outpatient follow-up.  The patient has been appropriately medically screened and/or stabilized in the ED. I have low suspicion for any other emergent medical condition which would require further screening, evaluation or treatment in the ED or require inpatient management.  Patient is hemodynamically stable and in no acute distress.  Patient able to ambulate in department prior to ED.  Evaluation does not show acute pathology that would require ongoing or additional emergent interventions while in the emergency department or further inpatient treatment.  I have discussed the diagnosis with the patient and answered all questions.  Pain is been managed while in the emergency department and patient has no further complaints prior to discharge.  Patient is comfortable with plan discussed in room and is stable for discharge at this time.  I have discussed strict return precautions for returning to the emergency department.  Patient was encouraged to follow-up with PCP/specialist refer to at discharge.     MDM Rules/Calculators/A&P                            Final Clinical Impression(s) / ED Diagnoses Final diagnoses:  COVID    Rx / DC Orders ED Discharge Orders          Ordered    benzonatate (TESSALON) 100 MG capsule  Every 8 hours        09/04/21 1117    fluticasone (FLONASE) 50 MCG/ACT nasal spray  Daily        09/04/21 1117             Sparkle Aube A, PA-C 09/04/21 1127    Gareth Morgan, MD 09/05/21 0021

## 2021-09-04 NOTE — ED Notes (Signed)
Pt verbalizes understanding of discharge instructions. Opportunity for questions and answers were provided. Pt discharged from the ED.   ?

## 2021-09-07 ENCOUNTER — Encounter: Payer: Medicaid Other | Admitting: Physical Therapy

## 2021-09-13 ENCOUNTER — Encounter: Payer: Medicaid Other | Admitting: Physical Therapy

## 2021-09-21 ENCOUNTER — Other Ambulatory Visit: Payer: Self-pay

## 2021-09-21 ENCOUNTER — Encounter: Payer: Self-pay | Admitting: Physical Therapy

## 2021-09-21 ENCOUNTER — Ambulatory Visit: Payer: Medicaid Other | Admitting: Physical Therapy

## 2021-09-21 DIAGNOSIS — R293 Abnormal posture: Secondary | ICD-10-CM | POA: Diagnosis not present

## 2021-09-21 DIAGNOSIS — G8929 Other chronic pain: Secondary | ICD-10-CM

## 2021-09-21 NOTE — Therapy (Addendum)
[place], [place], [address] Phone: [phone]   Fax:  [phone]  Physical Therapy Treatment/Discharge  Patient Details  Name: Katherine [name] MRN: [id number] Date of Birth: 10-19-1970 Referring Provider (PT): Dr.  Vanita Panda   Encounter Date: 09/21/2021   PT End of Session - 09/21/21 0939     Visit Number 2    Number of Visits 8    Date for PT Re-Evaluation 10/25/21    Authorization Type [place] Medicaid [employer]    PT Start Time 0931    PT Stop Time 1015    PT Time Calculation (min) 44 min    Activity Tolerance Patient tolerated treatment well    Behavior During Therapy [facility] for tasks assessed/performed             Past Medical History:  Diagnosis Date   Anxiety    Asthma    Dysrhythmia    History of SVT-No medications   Fibromyalgia    GERD (gastroesophageal reflux disease)    History of radiation therapy 12/19/17- 01/15/18   Right Breast, 2.67 Gy in 15 fractions for a total dose of 40.05 Gy. Boost, 2 Gy in 5 fractions for a total dose of 10 Gy   Hot flashes    Malignant neoplasm of upper-outer quadrant of right female breast ([place]) 06/2017   right breast   Migraine    Palpitations    Pneumonia    with cavitation of left lower lobe   SVT (supraventricular tachycardia) ([place])    Zio Monitor 11/2019: Patient only wore for 3 day (adhesive allergy); NSR, Avg HR 65, no arrhythmia   Tuberculosis    exposure as a child, get checked frequently   Uterine fibroid     Past Surgical History:  Procedure Laterality Date   ABDOMINAL HYSTERECTOMY     BREAST LUMPECTOMY Right    BREAST LUMPECTOMY WITH RADIOACTIVE SEED AND SENTINEL LYMPH NODE BIOPSY Right 07/24/2017   Procedure: RIGHT BREAST LUMPECTOMY WITH RADIOACTIVE SEED AND SENTINEL LYMPH NODE BIOPSY;  Surgeon: Erroll Luna, MD;  Location: [place];  Service: General;  Laterality: Right;   PORT-A-CATH REMOVAL Right 06/18/2019    Procedure: REMOVAL PORT-A-CATH;  Surgeon: Erroll Luna, MD;  Location: [place];  Service: General;  Laterality: Right;   PORTACATH PLACEMENT Right 07/24/2017   Procedure: INSERTION PORT-A-CATH;  Surgeon: Erroll Luna, MD;  Location: [place];  Service: General;  Laterality: Right;   [place] Bilateral 12/19/2016   Procedure: HYSTERECTOMY VAGINAL;  Surgeon: Lavonia Drafts, MD;  Location: [facility];  Service: Gynecology;  Laterality: Bilateral;   WISDOM TOOTH EXTRACTION      There were no vitals filed for this visit.   Subjective Assessment - 09/21/21 0933     Subjective I am better now , had COVID.  But now I'm going through a divorce.  Pain is 8/10 today. Did the exercises a little bit.    Currently in Pain? Yes    Pain Score 8     Pain Location Back    Pain Orientation Right;Left    Pain Descriptors / Indicators Aching;Sore    Pain Type Chronic pain    Pain Onset More than a month ago    Pain Frequency Constant    Aggravating Factors  activity    Pain Relieving Factors laying down              [facility]  Adult PT Treatment/Exercise:   Therapeutic Exercise: Nustep L6 UE and LE 6 min Rt LE stiff and cramping  Supine lower trunk rotation x 10  Hamstring stretch dynamic and static  Knee to chest 30 sec x 3  Standing trunk ROM Sit to stand 22 sec x 5, did x 15  Bridge x 10 x 2 sets  Calf stretch used for cramping Quad stretch Rt LE  Quadruped child's pose x 3  Tried bird dog but unable- did just LE only x 10 each  Wall sits x 10    Manual Therapy:  LAD on Rt RLE x 5     Consider / progression for next session:        L knee weak, Rt low back and leg cramping       PT Long Term Goals - 08/30/21 1642       PT LONG TERM GOAL #1   Title Pt will be I with HEP for flexibility and strength upon discharge    Baseline unknown    Time 6    Period Weeks    Status New    Target Date  10/11/21      PT LONG TERM GOAL #2   Title Pt will be able to walk in the community with back pain < 5/10 for up to 15-20 min at a time.    Baseline can be severe with 5-10 min    Time 6    Period Weeks    Status New    Target Date 10/11/21      PT LONG TERM GOAL #3   Title Pt will be able to show proper lifting and squatting mechanics for light items in her home    Time 6    Period Weeks    Status New    Target Date 10/11/21      PT LONG TERM GOAL #4   Title Pt will be able to report less radicular sx, numbness in feet, legs to no more than 1 x per week    Time 6    Period Weeks    Status New    Target Date 10/11/21                   Plan - 09/21/21 0937     Clinical Impression Statement Patient with restlessness today, cramping with exercises, poor focus and erratic behavior.  She has L thigh weakness which resulted in a slide down to the wall. Advised her to commit to consistent PT , exercise as a solution.  Rt LE cramping throughout sessio causing her to get up and move all through the session.    PT Treatment/Interventions ADLs/Self Care Home Management;Therapeutic exercise;Cryotherapy;Moist Heat;Ultrasound;Balance training;Patient/family education;Manual techniques;Functional mobility training;Spinal Manipulations    PT Next Visit Plan check HEP, begin basic core    PT Home Exercise Plan Access Code [email or web address] Code: [email or web address]  URL: [email or web address]  Date: 09/21/2021  Prepared by: Raeford Razor    Exercises  Cat Cow to Child's Pose - 2 x daily - 7 x weekly - 1 sets - 5 reps - 30 hold  Supine Lower Trunk Rotation - 2 x daily - 7 x weekly - 2 sets - 10 reps - 10 hold  Supine Single Knee to Chest Stretch - 2 x daily - 7 x weekly - 1 sets - 5 reps - 30 hold  Supine Bridge - 2 x daily - 7 x weekly - 2 sets - 10 reps - 5  hold  Wall Squat - 2 x daily - 7 x weekly - 2 sets - 10 reps - 5 hold    Consulted and Agree with Plan of Care Patient;Family  member/caregiver             Patient will benefit from skilled therapeutic intervention in order to improve the following deficits and impairments:  Decreased strength, Increased fascial restricitons, Impaired flexibility, Difficulty walking, Decreased mobility, Postural dysfunction, Improper body mechanics  Visit Diagnosis: Chronic low back pain with bilateral sciatica, unspecified back pain laterality  Abnormal posture     Problem List Patient Active Problem List   Diagnosis Date Noted   Chemotherapy-induced peripheral neuropathy ([place]) 12/17/2017   Port-A-Cath in place 09/24/2017   Malignant neoplasm of upper-outer quadrant of right breast in female, estrogen receptor negative ([place]) 07/03/2017   Pelvic pain in female 12/21/2016   Fibroids, intramural 12/21/2016   Post-operative state 12/19/2016   Nocturia 08/09/2015   Fibromyalgia 06/25/2015   GERD (gastroesophageal reflux disease) 06/16/2013   Migraine 06/16/2013   Atrial tachycardia, paroxysmal ([place]) 03/31/2013   Headache 12/20/2012   Syncope 06/20/2012   Fibroids 03/26/2012   Family history of breast cancer 01/22/2012   Seasonal and perennial allergic rhinitis 07/18/2010   HAIR LOSS 07/04/2010   ANXIETY DISORDER, SITUATIONAL, MILD 11/17/2009   Palpitations 10/12/2009   CHEST PAIN, LEFT 10/12/2009   DYSURIA 08/26/2009   MAMMOGRAM, ABNORMAL, RIGHT, HX OF 04/09/2009   TOBACCO ABUSE 03/17/2009   DECREASED HEARING, RIGHT EAR 10/27/2008   BACK PAIN, LUMBAR 06/06/2007   HOT FLASHES 07/18/2006    Devian Bartolomei, PT 09/21/2021, 10:12 AM  [facility] [address] [name], [place], [address] Phone: [phone]   Fax:  [phone]  Name: BRITTAY MOGLE MRN: [id number] Date of Birth: [birth year]   Raeford Razor, PT 09/21/21 10:12 AM Phone: [phone] Fax: [phone]    Did not return to PT for treatment.  Raeford Razor, PT 10/25/21 11:12 AM Phone:  [phone] Fax: [phone]

## 2021-09-27 ENCOUNTER — Ambulatory Visit (INDEPENDENT_AMBULATORY_CARE_PROVIDER_SITE_OTHER): Payer: Medicaid Other

## 2021-09-27 DIAGNOSIS — I471 Supraventricular tachycardia: Secondary | ICD-10-CM

## 2021-09-28 ENCOUNTER — Other Ambulatory Visit: Payer: Medicaid Other

## 2021-09-28 LAB — CUP PACEART REMOTE DEVICE CHECK
Date Time Interrogation Session: 20230102233040
Implantable Pulse Generator Implant Date: 20210608

## 2021-09-29 ENCOUNTER — Ambulatory Visit: Payer: Medicaid Other | Attending: Family Medicine | Admitting: Physical Therapy

## 2021-09-29 ENCOUNTER — Telehealth: Payer: Self-pay | Admitting: Physical Therapy

## 2021-09-29 NOTE — Telephone Encounter (Signed)
Called patient regarding her missed appt today at 9:30.  I as unable to leave a voicemail as it was not set up.  No further appts. scheduled.  Will wait and DC PT within 30 days unless she calls to reschedule.  Raeford Razor, PT 09/29/21 9:46 AM Phone: [phone] Fax: [phone]

## 2021-09-30 ENCOUNTER — Telehealth: Payer: Self-pay

## 2021-09-30 NOTE — Telephone Encounter (Signed)
LINQ alert received.  1 pause event 1/5 @ 00:00, duration 17sec Likely undersensing but can not rule out loss of AV conduction Route for symptom evaluation   Successful telephone encounter to patient to assess for possible symptoms of loss of AV conduction although patient has a history of undersensing. Patient states she was asleep at the time of "episode". Reviewed with Dr. Lovena Le who agrees pause is actually undersensing. Cont to monitor.

## 2021-10-07 NOTE — Progress Notes (Signed)
Carelink Summary Report / Loop Recorder 

## 2021-10-12 ENCOUNTER — Other Ambulatory Visit: Payer: Self-pay | Admitting: *Deleted

## 2021-10-12 DIAGNOSIS — Z171 Estrogen receptor negative status [ER-]: Secondary | ICD-10-CM

## 2021-10-12 DIAGNOSIS — C50411 Malignant neoplasm of upper-outer quadrant of right female breast: Secondary | ICD-10-CM

## 2021-10-12 NOTE — Progress Notes (Signed)
Patient Care Team: Drue Flirt, MD as PCP - General (Family Medicine) Fay Records, MD as PCP - Cardiology (Cardiology) Evans Lance, MD as PCP - Electrophysiology (Cardiology) Erroll Luna, MD as Consulting Physician (General Surgery) Nicholas Lose, MD as Consulting Physician (Hematology and Oncology) Eppie Gibson, MD as Attending Physician (Radiation Oncology)  DIAGNOSIS:    ICD-10-CM   1. Malignant neoplasm of upper-outer quadrant of right breast in female, estrogen receptor negative ([place])  C50.411    Z17.1       SUMMARY OF ONCOLOGIC HISTORY: Oncology History  Malignant neoplasm of upper-outer quadrant of right breast in female, estrogen receptor negative ([place])  06/26/2017 Initial Diagnosis   Right breast biopsy 8:30 position 8 cm from nipple: IDC with DCIS, second biopsy 6 cm from nipple fibrocystic changes: Grade 3, ER 0%, PR 0%, Ki-67 40%, HER-2 positive ratio 3.59; mammogram and ultrasound revealed 1.9 cm mass at 8:30 position, adjacent 0.7 cm cystic mass, T1c N0 stage IA AJCC 8    07/24/2017 Surgery   Right lumpectomy: IDC grade 3, 1.8 cm, 0/2 lymph nodes negative,ER 0%, PR 0%, Ki-67 40%, HER-2 positive ratio 3.59, T1CN0 stage I a    09/10/2017 - 11/26/2017 Chemotherapy   Taxol Herceptin weekly x12 followed by Herceptin maintenance for 1 year   12/19/2017 - 01/15/2018 Radiation Therapy   Adjuvant radiation therapy     CHIEF COMPLIANT: Follow-up of right breast cancer  INTERVAL HISTORY: Katherine [name] is a 51 y.o. with above-mentioned history of right breast cancer treated with lumpectomy, adjuvant chemotherapy, radiation, Herceptin maintenance and who is currently on surveillance.  She is complaining of dizziness intermittently.  Denies any headaches or neurological deficits.  Denies any nausea vomiting.  Her eyesight has become very poor because of lack of proper eye checkup or glasses.  ALLERGIES:  is allergic to shellfish allergy, tea, penicillins, and  tomato.  MEDICATIONS:  Current Outpatient Medications  Medication Sig Dispense Refill   albuterol (PROVENTIL HFA;VENTOLIN HFA) 108 (90 Base) MCG/ACT inhaler Inhale 2 puffs into the lungs every 6 (six) hours as needed for wheezing or shortness of breath.     benzonatate (TESSALON) 100 MG capsule Take 1 capsule (100 mg total) by mouth every 8 (eight) hours. 21 capsule 0   DULoxetine (CYMBALTA) 30 MG capsule TAKE 1 CAPSULE (30 MG TOTAL) BY MOUTH DAILY. X 1 WEEK THEN TAKE 2 CAPSULES DAILY (Patient taking differently: Take 60 mg by mouth daily.) 60 capsule 5   esomeprazole (NEXIUM) 40 MG capsule Take 40 mg by mouth daily before breakfast.      fluticasone (FLONASE) 50 MCG/ACT nasal spray Place 2 sprays into both nostrils daily. 9.9 mL 0   hydrocortisone cream 1 % Apply to affected area 2 times daily 15 g 0   ibuprofen (ADVIL) 600 MG tablet Take 1 tablet (600 mg total) by mouth every 6 (six) hours as needed for mild pain. 60 tablet 3   predniSONE (STERAPRED UNI-PAK 21 TAB) 10 MG (21) TBPK tablet Take by mouth daily. Take 6 tabs by mouth daily  for 2 days, then 5 tabs for 2 days, then 4 tabs for 2 days, then 3 tabs for 2 days, 2 tabs for 2 days, then 1 tab by mouth daily for 2 days 42 tablet 0   SYMBICORT 80-4.5 MCG/ACT inhaler Inhale 2 puffs into the lungs 3 (three) times daily.     No current facility-administered medications for this visit.    PHYSICAL EXAMINATION: ECOG PERFORMANCE STATUS:  1 - Symptomatic but completely ambulatory  Vitals:   10/13/21 1328  BP: 115/74  Pulse: 64  Resp: 17  Temp: (!) 97.3 F (36.3 C)  SpO2: 100%   Filed Weights   10/13/21 1328  Weight: 142 lb 11.2 oz (64.7 kg)    BREAST: No palpable masses or nodules in either right or left breasts. No palpable axillary supraclavicular or infraclavicular adenopathy no breast tenderness or nipple discharge. (exam performed in the presence of a chaperone)  LABORATORY DATA:  I have reviewed the data as listed CMP Latest  Ref Rng & Units 07/19/2020 12/04/2019 11/20/2019  Glucose 70 - 99 mg/dL 94 105(H) 96  BUN 6 - 20 mg/dL '7 9 11  ' Creatinine 0.44 - 1.00 mg/dL 0.56 0.68 0.74  Sodium 135 - 145 mmol/L 140 142 145  Potassium 3.5 - 5.1 mmol/L 3.6 3.9 4.4  Chloride 98 - 111 mmol/L 108 107 107  CO2 22 - 32 mmol/L '24 23 27  ' Calcium 8.9 - 10.3 mg/dL 9.0 9.6 9.2  Total Protein 6.5 - 8.1 g/dL - 7.6 7.7  Total Bilirubin 0.3 - 1.2 mg/dL - 0.6 0.5  Alkaline Phos 38 - 126 U/L - 78 86  AST 15 - 41 U/L - 23 19  ALT 0 - 44 U/L - 25 15    Lab Results  Component Value Date   WBC 4.8 10/13/2021   HGB 11.2 (L) 10/13/2021   HCT 34.2 (L) 10/13/2021   MCV 82.8 10/13/2021   PLT 202 10/13/2021   NEUTROABS 2.1 10/13/2021    ASSESSMENT & PLAN:  Malignant neoplasm of upper-outer quadrant of right breast in female, estrogen receptor negative ([place]) 07/24/2017:Right lumpectomy: IDC grade 3, 1.8 cm, 0/2 lymph nodes negative,ER 0%, PR 0%, Ki-67 40%, HER-2 positive ratio 3.59, T1CN0 stage I a    Recommendation: 1.  Adjuvant chemotherapy with Taxol Herceptin completed 11/26/2017 followed by Herceptin maintenance for 1 year completed 08/26/2018 2. followed by adjuvant radiation therapy 12/19/2017 to 01/15/2018 ------------------------------------------------------------------------------------------------------------------------------------ 11/29/2019 CT abdomen and pelvis performed to evaluate back pain: No evidence of metastatic disease. Severe diffuse arthralgias and myalgias:     Right breast pain: Could be related to fibromyalgia.   Breast cancer surveillance:   1.  Mammogram and ultrasound 12/27/2020: : Benign breast density category D 2. breast exam 10/13/2021: Benign   Dizziness: Because she has HER2 positive breast cancer, I recommended that we obtain a brain MRI. I will call her with the results of the brain MRI.  She has a broken tooth and poor vision: We will assess if there may be opportunities through social work refer her  to get her dental and eye exam  Return to clinic in 1 year for follow-up.    No orders of the defined types were placed in this encounter.  The patient has a good understanding of the overall plan. she agrees with it. she will call with any problems that may develop before the next visit here.  Total time spent: 30 mins including face to face time and time spent for planning, charting and coordination of care  Rulon Eisenmenger, MD, MPH 10/13/2021  I, Thana Ates, am acting as scribe for Dr. Nicholas Lose.  I have reviewed the above documentation for accuracy and completeness, and I agree with the above.

## 2021-10-13 ENCOUNTER — Other Ambulatory Visit: Payer: Self-pay

## 2021-10-13 ENCOUNTER — Inpatient Hospital Stay: Payer: Medicaid Other | Attending: Hematology and Oncology | Admitting: Hematology and Oncology

## 2021-10-13 ENCOUNTER — Inpatient Hospital Stay: Payer: Medicaid Other

## 2021-10-13 VITALS — BP 115/74 | HR 64 | Temp 97.3°F | Resp 17 | Ht 65.0 in | Wt 142.7 lb

## 2021-10-13 DIAGNOSIS — C50411 Malignant neoplasm of upper-outer quadrant of right female breast: Secondary | ICD-10-CM | POA: Insufficient documentation

## 2021-10-13 DIAGNOSIS — Z171 Estrogen receptor negative status [ER-]: Secondary | ICD-10-CM

## 2021-10-13 DIAGNOSIS — R42 Dizziness and giddiness: Secondary | ICD-10-CM | POA: Diagnosis not present

## 2021-10-13 LAB — CBC WITH DIFFERENTIAL (CANCER CENTER ONLY)
Abs Immature Granulocytes: 0.01 10*3/uL (ref 0.00–0.07)
Basophils Absolute: 0 10*3/uL (ref 0.0–0.1)
Basophils Relative: 0 %
Eosinophils Absolute: 0.2 10*3/uL (ref 0.0–0.5)
Eosinophils Relative: 4 %
HCT: 34.2 % — ABNORMAL LOW (ref 36.0–46.0)
Hemoglobin: 11.2 g/dL — ABNORMAL LOW (ref 12.0–15.0)
Immature Granulocytes: 0 %
Lymphocytes Relative: 45 %
Lymphs Abs: 2.1 10*3/uL (ref 0.7–4.0)
MCH: 27.1 pg (ref 26.0–34.0)
MCHC: 32.7 g/dL (ref 30.0–36.0)
MCV: 82.8 fL (ref 80.0–100.0)
Monocytes Absolute: 0.3 10*3/uL (ref 0.1–1.0)
Monocytes Relative: 7 %
Neutro Abs: 2.1 10*3/uL (ref 1.7–7.7)
Neutrophils Relative %: 44 %
Platelet Count: 202 10*3/uL (ref 150–400)
RBC: 4.13 MIL/uL (ref 3.87–5.11)
RDW: 14.7 % (ref 11.5–15.5)
WBC Count: 4.8 10*3/uL (ref 4.0–10.5)
nRBC: 0 % (ref 0.0–0.2)

## 2021-10-13 LAB — CMP (CANCER CENTER ONLY)
ALT: 7 U/L (ref 0–44)
AST: 12 U/L — ABNORMAL LOW (ref 15–41)
Albumin: 4 g/dL (ref 3.5–5.0)
Alkaline Phosphatase: 54 U/L (ref 38–126)
Anion gap: 5 (ref 5–15)
BUN: 10 mg/dL (ref 6–20)
CO2: 29 mmol/L (ref 22–32)
Calcium: 9.2 mg/dL (ref 8.9–10.3)
Chloride: 106 mmol/L (ref 98–111)
Creatinine: 0.64 mg/dL (ref 0.44–1.00)
GFR, Estimated: >60 mL/min (ref >60)
Glucose, Bld: 87 mg/dL (ref 70–99)
Potassium: 3.9 mmol/L (ref 3.5–5.1)
Sodium: 140 mmol/L (ref 135–145)
Total Bilirubin: 0.3 mg/dL (ref 0.3–1.2)
Total Protein: 6.3 g/dL — ABNORMAL LOW (ref 6.5–8.1)

## 2021-10-13 NOTE — Assessment & Plan Note (Signed)
07/24/2017:Right lumpectomy: IDC grade 3, 1.8 cm, 0/2 lymph nodes negative,ER 0%, PR 0%, Ki-67 40%, HER-2 positive ratio 3.59, T1CN0 stage I a  Recommendation: 1. Adjuvant chemotherapy with Taxol Herceptin completed 3/4/2019followed by Herceptin maintenance for 1 yearcompleted 08/26/2018 2. followed by adjuvant radiation therapy3/27/2019 to 01/15/2018 ------------------------------------------------------------------------------------------------------------------------------------ 11/29/2019 CT abdomen and pelvis performed to evaluate back pain: No evidence of metastatic disease. Severe diffuse arthralgias and myalgias:   Right breast pain: Could be related to fibromyalgia.  Breast cancer surveillance:   1.  Mammogram and ultrasound 12/27/2020: : Benign breast density category D 2. breast exam 10/13/2021: Benign  Return to clinic in 1 year for follow-up.

## 2021-10-14 ENCOUNTER — Telehealth: Payer: Self-pay | Admitting: Licensed Clinical Social Worker

## 2021-10-14 NOTE — Telephone Encounter (Signed)
2nd attempt to contact patient regarding information for dental / eye services. No answer, no VM.   Christeen Douglas, LCSW

## 2021-10-14 NOTE — Telephone Encounter (Signed)
[place] Work  Clinical Social Work was referred by Therapist, sports for information on free dentist and eye doctor.  Clinical Social Worker  attempted to contact patient by phone   to offer resources.  No answer. No VM set up, unable to leave message.   According to her chart, patient has [employer]. She can look up providers online through her insurance or call them to determine local providers.   Other options include...  - [place] dental hygiene school - Control and instrumentation engineer program for uninsured [email or web address]  - [facility]...https://www.[facility].org/visionusa        [place], [place] Worker [employer]

## 2021-10-17 ENCOUNTER — Encounter: Payer: Self-pay | Admitting: Licensed Clinical Social Worker

## 2021-10-17 NOTE — Progress Notes (Signed)
[place] Work  CSW spoke with patient regarding dental and eye car. Explained how to find doctors through her insurance website or by calling the number on her insurance card. Pt requested that CSW mail her the information from the [facility] website. Mailed today.   Christeen Douglas, LCSW

## 2021-10-19 NOTE — Progress Notes (Incomplete)
HEMATOLOGY-ONCOLOGY TELEPHONE VISIT PROGRESS NOTE  I connected with [name] on 10/19/2021 at 11:45 AM EST by telephone and verified that I am speaking with the correct person using two identifiers.  I discussed the limitations, risks, security and privacy concerns of performing an evaluation and management service by telephone and the availability of in person appointments.  I also discussed with the patient that there may be a patient responsible charge related to this service. The patient expressed understanding and agreed to proceed.   History of Present Illness: Katherine [name] is a 51 y.o. female with above-mentioned history of right breast cancer treated with lumpectomy, adjuvant chemotherapy, radiation, Herceptin maintenance and who is currently on surveillance. She presents via telephone today for follow-up.  Oncology History  Malignant neoplasm of upper-outer quadrant of right breast in female, estrogen receptor negative ([place])  06/26/2017 Initial Diagnosis   Right breast biopsy 8:30 position 8 cm from nipple: IDC with DCIS, second biopsy 6 cm from nipple fibrocystic changes: Grade 3, ER 0%, PR 0%, Ki-67 40%, HER-2 positive ratio 3.59; mammogram and ultrasound revealed 1.9 cm mass at 8:30 position, adjacent 0.7 cm cystic mass, T1c N0 stage IA AJCC 8    07/24/2017 Surgery   Right lumpectomy: IDC grade 3, 1.8 cm, 0/2 lymph nodes negative,ER 0%, PR 0%, Ki-67 40%, HER-2 positive ratio 3.59, T1CN0 stage I a    09/10/2017 - 11/26/2017 Chemotherapy   Taxol Herceptin weekly x12 followed by Herceptin maintenance for 1 year   12/19/2017 - 01/15/2018 Radiation Therapy   Adjuvant radiation therapy     Observations/Objective:     Assessment Plan:  No problem-specific Assessment & Plan notes found for this encounter.    I discussed the assessment and treatment plan with the patient. The patient was provided an opportunity to ask questions and all were answered. The patient agreed with the  plan and demonstrated an understanding of the instructions. The patient was advised to call back or seek an in-person evaluation if the symptoms worsen or if the condition fails to improve as anticipated.   Total time spent: *** mins including non-face to face time and time spent for planning, charting and coordination of care  Rulon Eisenmenger, MD 10/19/2021    I, Thana Ates, am acting as scribe for Nicholas Lose, MD.  {Add scribe attestation statement}

## 2021-10-21 ENCOUNTER — Telehealth: Payer: Medicaid Other | Admitting: Hematology and Oncology

## 2021-10-21 ENCOUNTER — Telehealth: Payer: Self-pay | Admitting: *Deleted

## 2021-10-21 NOTE — Telephone Encounter (Signed)
Per MD request, f/u appt canceled today due to pt not having Brain MRI completed at this time.  RN placed call to pt and transferred pt to central scheduling to schedule MRI based on pt availability.  This RN noticed that MRI has not been scheduled as of yet.  RN attempt to contact pt x2 and LVM with instructions and phone number for Central Scheduling and to alert our office once MRI is scheduled for MD f/u.

## 2021-10-26 ENCOUNTER — Other Ambulatory Visit: Payer: Self-pay

## 2021-10-26 ENCOUNTER — Ambulatory Visit (HOSPITAL_COMMUNITY)
Admission: RE | Admit: 2021-10-26 | Discharge: 2021-10-26 | Disposition: A | Discharge status: Home / Self Care | Payer: Medicaid Other | Source: Ambulatory Visit | Attending: Hematology and Oncology | Admitting: Hematology and Oncology

## 2021-10-26 DIAGNOSIS — R42 Dizziness and giddiness: Secondary | ICD-10-CM | POA: Diagnosis present

## 2021-10-26 DIAGNOSIS — C50411 Malignant neoplasm of upper-outer quadrant of right female breast: Secondary | ICD-10-CM | POA: Diagnosis present

## 2021-10-26 DIAGNOSIS — Z171 Estrogen receptor negative status [ER-]: Secondary | ICD-10-CM | POA: Insufficient documentation

## 2021-10-26 MED ORDER — GADOBUTROL 1 MMOL/ML IV SOLN
7.0000 mL | Freq: Once | INTRAVENOUS | Status: AC | PRN
  Administered 2021-10-26: 7 mL via INTRAVENOUS

## 2021-10-28 ENCOUNTER — Telehealth: Payer: Self-pay | Admitting: Hematology and Oncology

## 2021-10-28 NOTE — Telephone Encounter (Signed)
I left a message on her home voicemail that the brain MRI did not show any metastatic disease.

## 2021-10-31 ENCOUNTER — Ambulatory Visit (INDEPENDENT_AMBULATORY_CARE_PROVIDER_SITE_OTHER): Payer: Medicaid Other

## 2021-10-31 DIAGNOSIS — I471 Supraventricular tachycardia: Secondary | ICD-10-CM

## 2021-10-31 LAB — CUP PACEART REMOTE DEVICE CHECK
Date Time Interrogation Session: 20230205233102
Implantable Pulse Generator Implant Date: 20210608

## 2021-11-03 NOTE — Progress Notes (Signed)
Carelink Summary Report / Loop Recorder 

## 2021-12-05 ENCOUNTER — Ambulatory Visit (INDEPENDENT_AMBULATORY_CARE_PROVIDER_SITE_OTHER): Payer: Medicaid Other

## 2021-12-05 DIAGNOSIS — I471 Supraventricular tachycardia: Secondary | ICD-10-CM | POA: Diagnosis not present

## 2021-12-06 LAB — CUP PACEART REMOTE DEVICE CHECK
Date Time Interrogation Session: 20230312231048
Implantable Pulse Generator Implant Date: 20210608

## 2021-12-19 NOTE — Progress Notes (Signed)
Carelink Summary Report / Loop Recorder 

## 2022-01-09 ENCOUNTER — Ambulatory Visit (INDEPENDENT_AMBULATORY_CARE_PROVIDER_SITE_OTHER): Payer: Medicaid Other

## 2022-01-09 DIAGNOSIS — I471 Supraventricular tachycardia: Secondary | ICD-10-CM | POA: Diagnosis not present

## 2022-01-10 LAB — CUP PACEART REMOTE DEVICE CHECK
Date Time Interrogation Session: 20230414231140
Implantable Pulse Generator Implant Date: 20210608

## 2022-01-16 ENCOUNTER — Ambulatory Visit (HOSPITAL_COMMUNITY)
Admission: EM | Admit: 2022-01-16 | Discharge: 2022-01-16 | Disposition: A | Discharge status: Home / Self Care | Payer: Medicaid Other | Attending: Internal Medicine | Admitting: Internal Medicine

## 2022-01-16 ENCOUNTER — Encounter (HOSPITAL_COMMUNITY): Payer: Self-pay | Admitting: Emergency Medicine

## 2022-01-16 DIAGNOSIS — B9689 Other specified bacterial agents as the cause of diseases classified elsewhere: Secondary | ICD-10-CM | POA: Insufficient documentation

## 2022-01-16 DIAGNOSIS — N760 Acute vaginitis: Secondary | ICD-10-CM | POA: Insufficient documentation

## 2022-01-16 LAB — POCT URINALYSIS DIPSTICK, ED / UC
Bilirubin Urine: NEGATIVE
Glucose, UA: NEGATIVE mg/dL
Hgb urine dipstick: NEGATIVE
Nitrite: NEGATIVE
Protein, ur: NEGATIVE mg/dL
Specific Gravity, Urine: >=1.03 (ref 1.005–1.030)
Urobilinogen, UA: 0.2 mg/dL (ref 0.0–1.0)
pH: 5.5 (ref 5.0–8.0)

## 2022-01-16 MED ORDER — METRONIDAZOLE 500 MG PO TABS: 500.0000 mg | ORAL_TABLET | Freq: Two times a day (BID) | ORAL | 0 refills | Status: AC

## 2022-01-16 NOTE — Discharge Instructions (Addendum)
Take medication as prescribed ?Will change treatment plan if needed based on lab results ?Recommend daily Miralax for constipation.  ?If your symptoms become worse return for evaluation.  ?

## 2022-01-16 NOTE — ED Triage Notes (Signed)
Pt c/o right side abd pains that is intermittent x 3 days. Reports that when urinating takes few minutes to get started. Pain is worse with movement.  ?Pt also reports her face is also breaking out that started the same time side started hurting.  ?

## 2022-01-16 NOTE — ED Provider Notes (Signed)
?[facility] ? ? ? ?CSN: [id number] ?Arrival date & time: 01/16/22  1523 ? ? ?  ? ?History   ?Chief Complaint ?Chief Complaint  ?Patient presents with  ? Abdominal Pain  ? ? ?HPI ?Katherine [name] is a 51 y.o. female.  ? ?Pt complains of intermittent right sided abdominal pain that started about three days ago.  She reports increased urination and feeling like she cannot fully empty her bladder. Denies fever, chills, dysuria, hematuria, flank pain, nausea, vomiting.  She has tried nothing for the sx.  She also complains of increased vaginal discharge with foul odor, denies vaginal itching, pelvic pain. Pain is worse with movement.  ? ? ?Past Medical History:  ?Diagnosis Date  ? Anxiety   ? Asthma   ? Dysrhythmia   ? History of SVT-No medications  ? Fibromyalgia   ? GERD (gastroesophageal reflux disease)   ? History of radiation therapy 12/19/17- 01/15/18  ? Right Breast, 2.67 Gy in 15 fractions for a total dose of 40.05 Gy. Boost, 2 Gy in 5 fractions for a total dose of 10 Gy  ? Hot flashes   ? 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 right breast  ? Migraine   ? Palpitations   ? Pneumonia   ? with cavitation of left lower lobe  ? SVT (supraventricular tachycardia) ([place])   ? Zio Monitor 11/2019: Patient only wore for 3 day (adhesive allergy); NSR, Avg HR 65, no arrhythmia  ? Tuberculosis   ? exposure as a child, get checked frequently  ? Uterine fibroid   ? ? ?Patient Active Problem List  ? Diagnosis Date Noted  ? 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 Port-A-Cath in place 09/24/2017  ? Malignant neoplasm of upper-outer quadrant of right breast in female, estrogen receptor negative ([place]) 07/03/2017  ? Pelvic pain in female 12/21/2016  ? Fibroids, intramural 12/21/2016  ? Post-operative state 12/19/2016  ? Nocturia 08/09/2015  ? Fibromyalgia 06/25/2015  ? GERD (gastroesophageal reflux disease) 06/16/2013  ? Migraine 06/16/2013  ? Atrial tachycardia,  paroxysmal ([place]) 03/31/2013  ? Headache 12/20/2012  ? Syncope 06/20/2012  ? Fibroids 03/26/2012  ? Family history of breast cancer 01/22/2012  ? Seasonal and perennial allergic rhinitis 07/18/2010  ? HAIR LOSS 07/04/2010  ? ANXIETY DISORDER, SITUATIONAL, MILD 11/17/2009  ? Palpitations 10/12/2009  ? CHEST PAIN, LEFT 10/12/2009  ? DYSURIA 08/26/2009  ? MAMMOGRAM, ABNORMAL, RIGHT, HX OF 04/09/2009  ? TOBACCO ABUSE 03/17/2009  ? DECREASED HEARING, RIGHT EAR 10/27/2008  ? BACK PAIN, LUMBAR 06/06/2007  ? HOT FLASHES 07/18/2006  ? ? ?Past Surgical History:  ?Procedure Laterality Date  ? ABDOMINAL HYSTERECTOMY    ? BREAST LUMPECTOMY Right   ? BREAST LUMPECTOMY WITH RADIOACTIVE SEED AND SENTINEL LYMPH NODE BIOPSY Right 07/24/2017  ? Procedure: RIGHT BREAST LUMPECTOMY WITH RADIOACTIVE SEED AND SENTINEL LYMPH NODE BIOPSY;  Surgeon: Erroll Luna, MD;  Location: [facility];  Service: General;  Laterality: Right;  ? PORT-A-CATH REMOVAL Right 06/18/2019  ? Procedure: REMOVAL PORT-A-CATH;  Surgeon: Erroll Luna, MD;  Location: [facility];  Service: General;  Laterality: Right;  ? PORTACATH PLACEMENT Right 07/24/2017  ? Procedure: INSERTION PORT-A-CATH;  Surgeon: Erroll Luna, MD;  Location: [facility];  Service: General;  Laterality: Right;  ? [place]  ? VAGINAL HYSTERECTOMY Bilateral 12/19/2016  ? Procedure: HYSTERECTOMY VAGINAL;  Surgeon: Lavonia Drafts, MD;  Location: [facility];  Service: Gynecology;  Laterality: Bilateral;  ? WISDOM TOOTH EXTRACTION    ? ? ?OB History   ? ? Gravida  ?2  ? Para  ?2  ? Term  ?1  ? Preterm  ?1  ? AB  ?   ? Living  ?2  ?  ? ? SAB  ?   ? IAB  ?   ? Ectopic  ?   ? Multiple  ?   ? Live Births  ?   ?   ?  ?  ? ? ? ?Home Medications   ? ?Prior to Admission medications   ?Medication Sig Start Date End Date Taking? Authorizing Provider  ?metroNIDAZOLE (FLAGYL) 500 MG tablet Take 1 tablet (500 mg total) by mouth 2 (two) times daily.  01/16/22  Yes Ward, Lenise Arena, PA-C  ?albuterol (PROVENTIL HFA;VENTOLIN HFA) 108 (90 Base) MCG/ACT inhaler Inhale 2 puffs into the lungs every 6 (six) hours as needed for wheezing or shortness of breath.    [provider]  ?DULoxetine (CYMBALTA) 30 MG capsule TAKE 1 CAPSULE (30 MG TOTAL) BY MOUTH DAILY. X 1 WEEK THEN TAKE 2 CAPSULES DAILY ?Patient taking differently: Take 60 mg by mouth daily. 12/24/19   Gardenia Phlegm, NP  ?fluticasone (FLONASE) 50 MCG/ACT nasal spray Place 2 sprays into both nostrils daily. 09/04/21   Henderly, Britni A, PA-C  ?hydrocortisone cream 1 % Apply to affected area 2 times daily 02/17/21   Couture, Cortni S, PA-C  ?ibuprofen (ADVIL) 600 MG tablet Take 1 tablet (600 mg total) by mouth every 6 (six) hours as needed for mild pain. 12/03/20   Nicholas Lose, MD  ?Dellis Anes 80-4.5 MCG/ACT inhaler Inhale 2 puffs into the lungs 3 (three) times daily. 11/14/19   [provider]  ? ? ?Family History ?Family History  ?Problem Relation Age of Onset  ? Migraines Daughter   ? Hypertension Mother   ? Heart disease Mother   ? Hypertension Father   ? Heart disease Father   ? Migraines Son   ? Hypertension Other   ? Colon cancer Neg Hx   ? Colon polyps Neg Hx   ? Diabetes Neg Hx   ? Kidney disease Neg Hx   ? Liver disease Neg Hx   ? Heart attack Neg Hx   ? Stroke Neg Hx   ? ? ?Social History ?Social History  ? ?Tobacco Use  ? Smoking status: Former  ?  Packs/day: 0.50  ?  Types: Cigarettes  ?  Quit date: 07/16/2017  ?  Years since quitting: 4.5  ? Smokeless tobacco: Never  ? Tobacco comments:  ?  smokes 1cigarette per day and more if she is stressed  ?Vaping Use  ? Vaping Use: Never used  ?Substance Use Topics  ? Alcohol use: No  ?  Alcohol/week: 0.0 standard drinks  ? Drug use: Yes  ?  Types: Marijuana  ?  Comment: occasional, for pain. states she hasnt used for a mont; 08/02/17 sometimes  ? ? ? ?Allergies   ?Shellfish allergy, Tea, Penicillins, and Tomato ? ? ?Review of  Systems ?Review of Systems ? ? ?Physical Exam ?Triage Vital Signs ?ED Triage Vitals  ?Enc Vitals Group  ?   BP 01/16/22 1620 106/83  ?   Pulse Rate 01/16/22 1620 85  ?   Resp 01/16/22 1620 18  ?   Temp 01/16/22 1620 99.1 ?F (37.3 ?C)  ?   Temp src --   ?   SpO2 01/16/22 1620 97 %  ?  Weight --   ?   Height --   ?   Head Circumference --   ?   Peak Flow --   ?   Pain Score 01/16/22 1618 9  ?   Pain Loc --   ?   Pain Edu? --   ?   Excl. in [place]? --   ? ?No data found. ? ?Updated Vital Signs ?BP 106/83 (BP Location: Left Arm)   Pulse 85   Temp 99.1 ?F (37.3 ?C)   Resp 18   LMP 11/13/2016   SpO2 97%  ? ?Visual Acuity ?Right Eye Distance:   ?Left Eye Distance:   ?Bilateral Distance:   ? ?Right Eye Near:   ?Left Eye Near:    ?Bilateral Near:    ? ?Physical Exam ? ? ?UC Treatments / Results  ?Labs ?(all labs ordered are listed, but only abnormal results are displayed) ?Labs Reviewed  ?POCT URINALYSIS DIPSTICK, ED / UC - Abnormal; Notable for the following components:  ?    Result Value  ? Ketones, ur TRACE (*)   ? Leukocytes,Ua TRACE (*)   ? All other components within normal limits  ?CERVICOVAGINAL ANCILLARY ONLY  ? ? ?EKG ? ? ?Radiology ?No results found. ? ?Procedures ?Procedures (including critical care time) ? ?Medications Ordered in UC ?Medications - No data to display ? ?Initial Impression / Assessment and Plan / UC Course  ?I have reviewed the triage vital signs and the nursing notes. ? ?Pertinent labs & imaging results that were available during my care of the patient were reviewed by me and considered in my medical decision making (see chart for details). ? ?  ? ?Will treat for BV, cervicovaginal swab pending.  UA with trace ketones, trace leukocytes.  Will culture.  Benign abdominal exam.  Pain could be associated with constipation.  Advised Miralax. ED precautions given. Will change treatment plan if needed based on lab results.  ?Final Clinical Impressions(s) / UC Diagnoses  ? ?Final diagnoses:  ?Bacterial  vaginitis  ? ? ? ?Discharge Instructions   ? ?  ?Take medication as prescribed ?Will change treatment plan if needed based on lab results ?Recommend daily Miralax for constipation.  ?If your symptoms become worse retur

## 2022-01-17 LAB — URINE CULTURE: Culture: NO GROWTH

## 2022-01-17 LAB — CERVICOVAGINAL ANCILLARY ONLY
Candida Glabrata: NEGATIVE
Candida Vaginitis: NEGATIVE
Chlamydia: NEGATIVE
Comment: NEGATIVE
Comment: NEGATIVE
Comment: NEGATIVE
Comment: NEGATIVE
Comment: NORMAL
Neisseria Gonorrhea: NEGATIVE
Trichomonas: POSITIVE — AB

## 2022-01-26 NOTE — Progress Notes (Signed)
Carelink Summary Report / Loop Recorder 

## 2022-02-13 ENCOUNTER — Ambulatory Visit (INDEPENDENT_AMBULATORY_CARE_PROVIDER_SITE_OTHER): Payer: Medicaid Other

## 2022-02-13 DIAGNOSIS — R002 Palpitations: Secondary | ICD-10-CM | POA: Diagnosis not present

## 2022-02-15 LAB — CUP PACEART REMOTE DEVICE CHECK
Date Time Interrogation Session: 20230524092423
Implantable Pulse Generator Implant Date: 20210608

## 2022-03-01 NOTE — Progress Notes (Signed)
Carelink Summary Report / Loop Recorder 

## 2022-03-27 ENCOUNTER — Ambulatory Visit (HOSPITAL_COMMUNITY)
Admission: EM | Admit: 2022-03-27 | Discharge: 2022-03-27 | Disposition: A | Discharge status: Home / Self Care | Payer: Medicaid Other

## 2022-03-27 ENCOUNTER — Encounter (HOSPITAL_COMMUNITY): Payer: Self-pay | Admitting: Emergency Medicine

## 2022-03-27 DIAGNOSIS — R109 Unspecified abdominal pain: Secondary | ICD-10-CM

## 2022-03-27 LAB — POCT URINALYSIS DIPSTICK, ED / UC
Bilirubin Urine: NEGATIVE
Glucose, UA: NEGATIVE mg/dL
Hgb urine dipstick: NEGATIVE
Ketones, ur: NEGATIVE mg/dL
Leukocytes,Ua: NEGATIVE
Nitrite: NEGATIVE
Protein, ur: NEGATIVE mg/dL
Specific Gravity, Urine: 1.02 (ref 1.005–1.030)
Urobilinogen, UA: 1 mg/dL (ref 0.0–1.0)
pH: 6 (ref 5.0–8.0)

## 2022-03-27 MED ORDER — KETOROLAC TROMETHAMINE 30 MG/ML IJ SOLN
INTRAMUSCULAR | Status: AC
  Filled 2022-03-27: qty 1

## 2022-03-27 MED ORDER — KETOROLAC TROMETHAMINE 30 MG/ML IJ SOLN
30.0000 mg | Freq: Once | INTRAMUSCULAR | Status: AC
  Administered 2022-03-27: 30 mg via INTRAMUSCULAR

## 2022-03-27 NOTE — Discharge Instructions (Addendum)
Drink plenty of water Take Tylenol as needed Follow up with primary care physician  If symptoms become worse or you develop nausea, vomiting, fever go to the Emergency Department for evaluation

## 2022-03-27 NOTE — ED Triage Notes (Signed)
Pt reports left flank pain, headache, and neck pains that have ben ongoing for a few days. Reports urinary frequency.

## 2022-03-27 NOTE — ED Provider Notes (Signed)
[facility]    CSN: [id number] Arrival date & time: 03/27/22  1905      History   Chief Complaint Chief Complaint  Patient presents with   Headache   Neck Pain   Flank Pain    HPI Katherine [name] is a 51 y.o. female.   Pt complains of left flank pain that started a few days ago.  She also complains of headache and body aches.  She reports increased urinary frequency.  Denies dysuria, hematuria, abdominal pain, fever, chills, n/v/d.  She has tried nothing for her sx.     Past Medical History:  Diagnosis Date   Anxiety    Asthma    Dysrhythmia    History of SVT-No medications   Fibromyalgia    GERD (gastroesophageal reflux disease)    History of radiation therapy 12/19/17- 01/15/18   Right Breast, 2.67 Gy in 15 fractions for a total dose of 40.05 Gy. 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   right breast   Migraine    Palpitations    Pneumonia    with cavitation of left lower lobe   SVT (supraventricular tachycardia) ([place])    Zio Monitor 11/2019: Patient only wore for 3 day (adhesive allergy); NSR, Avg HR 65, no arrhythmia   Tuberculosis    exposure as a child, get checked frequently   Uterine fibroid     Patient Active Problem List   Diagnosis Date Noted   Chemotherapy-induced peripheral neuropathy ([place]) 12/17/2017   Port-A-Cath in place 09/24/2017   Malignant neoplasm of upper-outer quadrant of right breast in female, estrogen receptor negative ([place]) 07/03/2017   Pelvic pain in female 12/21/2016   Fibroids, intramural 12/21/2016   Post-operative state 12/19/2016   Nocturia 08/09/2015   Fibromyalgia 06/25/2015   GERD (gastroesophageal reflux disease) 06/16/2013   Migraine 06/16/2013   Atrial tachycardia, paroxysmal ([place]) 03/31/2013   Headache 12/20/2012   Syncope 06/20/2012   Fibroids 03/26/2012   Family history of breast cancer 01/22/2012    Seasonal and perennial allergic rhinitis 07/18/2010   HAIR LOSS 07/04/2010   ANXIETY DISORDER, SITUATIONAL, MILD 11/17/2009   Palpitations 10/12/2009   CHEST PAIN, LEFT 10/12/2009   DYSURIA 08/26/2009   MAMMOGRAM, ABNORMAL, RIGHT, HX OF 04/09/2009   TOBACCO ABUSE 03/17/2009   DECREASED HEARING, RIGHT EAR 10/27/2008   BACK PAIN, LUMBAR 06/06/2007   HOT FLASHES 07/18/2006    Past Surgical History:  Procedure Laterality Date   ABDOMINAL HYSTERECTOMY     BREAST LUMPECTOMY Right    BREAST LUMPECTOMY WITH RADIOACTIVE SEED AND SENTINEL LYMPH NODE BIOPSY Right 07/24/2017   Procedure: RIGHT BREAST LUMPECTOMY WITH RADIOACTIVE SEED AND SENTINEL LYMPH NODE BIOPSY;  Surgeon: Erroll Luna, MD;  Location: [facility];  Service: General;  Laterality: Right;   PORT-A-CATH REMOVAL Right 06/18/2019   Procedure: REMOVAL PORT-A-CATH;  Surgeon: Erroll Luna, MD;  Location: [facility];  Service: General;  Laterality: Right;   PORTACATH PLACEMENT Right 07/24/2017   Procedure: INSERTION PORT-A-CATH;  Surgeon: Erroll Luna, MD;  Location: [name];  Service: General;  Laterality: Right;   [place] Bilateral 12/19/2016   Procedure: HYSTERECTOMY VAGINAL;  Surgeon: Lavonia Drafts, MD;  Location: [facility];  Service: Gynecology;  Laterality: Bilateral;   WISDOM TOOTH EXTRACTION      OB History  Gravida  2   Para  2   Term  1   Preterm  1   AB      Living  2      SAB      IAB      Ectopic      Multiple      Live Births               Home Medications    Prior to Admission medications   Medication Sig Start Date End Date Taking? Authorizing Provider  omeprazole (PRILOSEC) 40 MG capsule Take 40 mg by mouth daily at 2 PM. 06/16/21  Yes [provider]  albuterol (PROVENTIL HFA;VENTOLIN HFA) 108 (90 Base) MCG/ACT inhaler Inhale 2 puffs into the lungs every 6 (six) hours as needed for  wheezing or shortness of breath.    [provider]  DULoxetine (CYMBALTA) 30 MG capsule TAKE 1 CAPSULE (30 MG TOTAL) BY MOUTH DAILY. X 1 WEEK THEN TAKE 2 CAPSULES DAILY Patient taking differently: Take 60 mg by mouth daily. 12/24/19   Gardenia Phlegm, NP  fluticasone (FLONASE) 50 MCG/ACT nasal spray Place 2 sprays into both nostrils daily. 09/04/21   Henderly, Britni A, PA-C  hydrocortisone cream 1 % Apply to affected area 2 times daily 02/17/21   Couture, Cortni S, PA-C  ibuprofen (ADVIL) 600 MG tablet Take 1 tablet (600 mg total) by mouth every 6 (six) hours as needed for mild pain. 12/03/20   Nicholas Lose, MD  metroNIDAZOLE (FLAGYL) 500 MG tablet Take 1 tablet (500 mg total) by mouth 2 (two) times daily. 01/16/22   Ward, Lenise Arena, PA-C  SYMBICORT 80-4.5 MCG/ACT inhaler Inhale 2 puffs into the lungs 3 (three) times daily. 11/14/19   [provider]    Family History Family History  Problem Relation Age of Onset   Migraines Daughter    Hypertension Mother    Heart disease Mother    Hypertension Father    Heart disease Father    Migraines Son    Hypertension Other    Colon cancer Neg Hx    Colon polyps Neg Hx    Diabetes Neg Hx    Kidney disease Neg Hx    Liver disease Neg Hx    Heart attack Neg Hx    Stroke Neg Hx     Social History Social History   Tobacco Use   Smoking status: Former    Packs/day: 0.50    Types: Cigarettes    Quit date: 07/16/2017    Years since quitting: 4.7   Smokeless tobacco: Never   Tobacco comments:    smokes 1cigarette per day and more if she is stressed  Vaping Use   Vaping Use: Never used  Substance Use Topics   Alcohol use: No    Alcohol/week: 0.0 standard drinks of alcohol   Drug use: Yes    Types: Marijuana    Comment: occasional, for pain. states she hasnt used for a mont; 08/02/17 sometimes     Allergies   Shellfish allergy, Tea, Penicillins, and Tomato   Review of Systems Review of Systems   Constitutional:  Negative for chills and fever.  HENT:  Negative for ear pain and sore throat.   Eyes:  Negative for pain and visual disturbance.  Respiratory:  Negative for cough and shortness of breath.   Cardiovascular:  Negative for chest pain and palpitations.  Gastrointestinal:  Negative for abdominal pain, nausea and vomiting.  Genitourinary:  Positive for  frequency. Negative for difficulty urinating, dysuria and hematuria.  Musculoskeletal:  Negative for arthralgias and back pain.  Skin:  Negative for color change and rash.  Neurological:  Negative for seizures and syncope.  All other systems reviewed and are negative.    Physical Exam Triage Vital Signs ED Triage Vitals  Enc Vitals Group     BP 03/27/22 1941 131/90     Pulse Rate 03/27/22 1941 68     Resp 03/27/22 1941 18     Temp 03/27/22 1941 98.9 F (37.2 C)     Temp Source 03/27/22 1941 Oral     SpO2 03/27/22 1941 97 %     Weight --      Height --      Head Circumference --      Peak Flow --      Pain Score 03/27/22 1939 10     Pain Loc --      Pain Edu? --      Excl. in [place]? --    No data found.  Updated Vital Signs BP 131/90 (BP Location: Left Arm)   Pulse 68   Temp 98.9 F (37.2 C) (Oral)   Resp 18   LMP 08/17/2016 (Approximate)   SpO2 97%   Visual Acuity Right Eye Distance:   Left Eye Distance:   Bilateral Distance:    Right Eye Near:   Left Eye Near:    Bilateral Near:     Physical Exam Vitals and nursing note reviewed.  Constitutional:      General: She is not in acute distress.    Appearance: She is well-developed.  HENT:     Head: Normocephalic and atraumatic.  Eyes:     Conjunctiva/sclera: Conjunctivae normal.  Cardiovascular:     Rate and Rhythm: Normal rate and regular rhythm.     Heart sounds: No murmur heard. Pulmonary:     Effort: Pulmonary effort is normal. No respiratory distress.     Breath sounds: Normal breath sounds.  Abdominal:     Palpations: Abdomen is soft.      Tenderness: There is no abdominal tenderness. There is left CVA tenderness. There is no right CVA tenderness.  Musculoskeletal:        General: No swelling.     Cervical back: Neck supple.  Skin:    General: Skin is warm and dry.     Capillary Refill: Capillary refill takes less than 2 seconds.  Neurological:     Mental Status: She is alert.  Psychiatric:        Mood and Affect: Mood normal.      UC Treatments / Results  Labs (all labs ordered are listed, but only abnormal results are displayed) Labs Reviewed  POCT URINALYSIS DIPSTICK, ED / UC    EKG   Radiology No results found.  Procedures Procedures (including critical care time)  Medications Ordered in UC Medications  ketorolac (TORADOL) 30 MG/ML injection 30 mg (30 mg Intramuscular Given 03/27/22 2027)    Initial Impression / Assessment and Plan / UC Course  I have reviewed the triage vital signs and the nursing notes.  Pertinent labs & imaging results that were available during my care of the patient were reviewed by me and considered in my medical decision making (see chart for details).     UA with no signs of infection, left CVA tenderness possibly kidney stones. Pt stable at this time, no n/v, fever.  Toradol given in clinic today.  Advised follow up  with PCP for imaging.  If pain becomes worse or she develops n/v, fever, cannot keep fluids down advised ED follow up.  Final Clinical Impressions(s) / UC Diagnoses   Final diagnoses:  Flank pain     Discharge Instructions      Drink plenty of water Take Tylenol as needed Follow up with primary care physician  If symptoms become worse or you develop nausea, vomiting, fever go to the Emergency Department for evaluation      ED Prescriptions   None    PDMP not reviewed this encounter.   Ward, Lenise Arena, PA-C 04/04/22 1850

## 2022-04-24 ENCOUNTER — Ambulatory Visit (INDEPENDENT_AMBULATORY_CARE_PROVIDER_SITE_OTHER): Payer: Medicaid Other

## 2022-04-24 DIAGNOSIS — R002 Palpitations: Secondary | ICD-10-CM

## 2022-04-25 LAB — CUP PACEART REMOTE DEVICE CHECK
Date Time Interrogation Session: 20230731191449
Implantable Pulse Generator Implant Date: 20210608

## 2022-05-27 NOTE — Progress Notes (Signed)
Carelink Summary Report / Loop Recorder 

## 2022-05-29 ENCOUNTER — Ambulatory Visit (HOSPITAL_COMMUNITY)
Admission: EM | Admit: 2022-05-29 | Discharge: 2022-05-29 | Disposition: A | Discharge status: Home / Self Care | Payer: Medicaid Other | Attending: Emergency Medicine | Admitting: Emergency Medicine

## 2022-05-29 ENCOUNTER — Encounter (HOSPITAL_COMMUNITY): Payer: Self-pay | Admitting: *Deleted

## 2022-05-29 DIAGNOSIS — K529 Noninfective gastroenteritis and colitis, unspecified: Secondary | ICD-10-CM | POA: Diagnosis not present

## 2022-05-29 LAB — POCT URINALYSIS DIPSTICK, ED / UC
Bilirubin Urine: NEGATIVE
Glucose, UA: NEGATIVE mg/dL
Ketones, ur: NEGATIVE mg/dL
Leukocytes,Ua: NEGATIVE
Nitrite: NEGATIVE
Protein, ur: NEGATIVE mg/dL
Specific Gravity, Urine: 1.025 (ref 1.005–1.030)
Urobilinogen, UA: 1 mg/dL (ref 0.0–1.0)
pH: 6.5 (ref 5.0–8.0)

## 2022-05-29 LAB — CUP PACEART REMOTE DEVICE CHECK
Date Time Interrogation Session: 20230824233450
Implantable Pulse Generator Implant Date: 20210608

## 2022-05-29 MED ORDER — ACETAMINOPHEN 325 MG PO TABS
650.0000 mg | ORAL_TABLET | Freq: Once | ORAL | Status: AC
  Administered 2022-05-29: 650 mg via ORAL

## 2022-05-29 MED ORDER — ONDANSETRON 4 MG PO TBDP: 4.0000 mg | ORAL_TABLET | Freq: Three times a day (TID) | ORAL | 0 refills | Status: DC | PRN

## 2022-05-29 MED ORDER — ACETAMINOPHEN 325 MG PO TABS
ORAL_TABLET | ORAL | Status: AC
  Filled 2022-05-29: qty 2

## 2022-05-29 MED ORDER — ONDANSETRON 4 MG PO TBDP
4.0000 mg | ORAL_TABLET | Freq: Once | ORAL | Status: AC
  Administered 2022-05-29: 4 mg via ORAL

## 2022-05-29 MED ORDER — ONDANSETRON 4 MG PO TBDP
ORAL_TABLET | ORAL | Status: AC
  Filled 2022-05-29: qty 1

## 2022-05-29 NOTE — ED Triage Notes (Addendum)
C/O intermittent abd cramping and diarrhea onset 2 days ago without vomiting or fevers. C/O left flank pain - states feels like it might be sore from using bathroom so much.  Also c/o decreased hearing in right ear "for a while now".

## 2022-05-29 NOTE — ED Provider Notes (Signed)
[facility]    CSN: [id number] Arrival date & time: 05/29/22  1438      History   Chief Complaint Chief Complaint  Patient presents with   Abdominal Pain   Diarrhea   Hearing Problem    HPI Katherine [name] is a 51 y.o. female.  Presents with 2-day history of abdominal cramping with diarrhea. Reports multiple episodes of watery, liquid diarrhea over the past 2 days. Denies any nausea, vomiting, fevers.  Lower abdominal cramping. Reports body aches all over.    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   right breast   Migraine    Palpitations    Pneumonia    with cavitation of left lower lobe   SVT (supraventricular tachycardia) ([place])    Zio Monitor 11/2019: Patient only wore for 3 day (adhesive allergy); NSR, Avg HR 65, no arrhythmia   Tuberculosis    exposure as a child, get checked frequently   Uterine fibroid     Patient Active Problem List   Diagnosis Date Noted   Chemotherapy-induced peripheral neuropathy ([place]) 12/17/2017   Port-A-Cath in place 09/24/2017   Malignant neoplasm of upper-outer quadrant of right breast in female, estrogen receptor negative ([place]) 07/03/2017   Pelvic pain in female 12/21/2016   Fibroids, intramural 12/21/2016   Post-operative state 12/19/2016   Nocturia 08/09/2015   Fibromyalgia 06/25/2015   GERD (gastroesophageal reflux disease) 06/16/2013   Migraine 06/16/2013   Atrial tachycardia,  paroxysmal ([place]) 03/31/2013   Headache 12/20/2012   Syncope 06/20/2012   Fibroids 03/26/2012   Family history of breast cancer 01/22/2012   Seasonal and perennial allergic rhinitis 07/18/2010   HAIR LOSS 07/04/2010   ANXIETY DISORDER, SITUATIONAL, MILD 11/17/2009   Palpitations 10/12/2009   CHEST PAIN, LEFT 10/12/2009   DYSURIA 08/26/2009   MAMMOGRAM, ABNORMAL, RIGHT, HX OF 04/09/2009   TOBACCO ABUSE 03/17/2009   DECREASED HEARING, RIGHT EAR 10/27/2008   BACK PAIN, LUMBAR 06/06/2007   HOT FLASHES 07/18/2006    Past Surgical History:  Procedure Laterality Date   ABDOMINAL HYSTERECTOMY     BREAST LUMPECTOMY Right    BREAST LUMPECTOMY WITH RADIOACTIVE SEED AND SENTINEL LYMPH NODE BIOPSY Right 07/24/2017   Procedure: RIGHT BREAST LUMPECTOMY WITH RADIOACTIVE SEED AND SENTINEL LYMPH NODE BIOPSY;  Surgeon: Erroll Luna, MD;  Location: [facility];  Service: General;  Laterality: Right;   PORT-A-CATH REMOVAL Right 06/18/2019   Procedure: REMOVAL PORT-A-CATH;  Surgeon: Erroll Luna, MD;  Location: [facility];  Service: General;  Laterality: Right;   PORTACATH PLACEMENT Right 07/24/2017   Procedure: INSERTION PORT-A-CATH;  Surgeon: Erroll Luna, MD;  Location: [facility];  Service: General;  Laterality: Right;   [place] Bilateral 12/19/2016   Procedure: HYSTERECTOMY VAGINAL;  Surgeon: Lavonia Drafts, MD;  Location: [facility];  Service: Gynecology;  Laterality: Bilateral;   WISDOM TOOTH EXTRACTION      OB History     Gravida  2   Para  2   Term  1   Preterm  1   AB      Living  2      SAB      IAB      Ectopic      Multiple      Live Births               Home Medications    Prior to Admission medications   Medication Sig Start Date End Date Taking? Authorizing Provider  omeprazole (PRILOSEC) 40 MG capsule Take 40 mg by mouth daily at 2 PM. 06/16/21  Yes [provider]  ondansetron (ZOFRAN-ODT) 4 MG disintegrating tablet Take 1 tablet (4 mg total) by mouth every 8 (eight) hours as needed for nausea or vomiting. 05/29/22  Yes Viraj Liby, Wells Guiles, PA-C  albuterol (PROVENTIL HFA;VENTOLIN HFA) 108 (90 Base) MCG/ACT inhaler Inhale 2 puffs into the lungs every 6 (six) hours as needed for wheezing or shortness of breath.    [provider]  DULoxetine (CYMBALTA) 30 MG capsule TAKE 1 CAPSULE (30 MG TOTAL) BY MOUTH DAILY. X 1 WEEK THEN TAKE 2 CAPSULES DAILY Patient taking differently: Take 60 mg by mouth daily. 12/24/19   Gardenia Phlegm, NP  fluticasone (FLONASE) 50 MCG/ACT nasal spray Place 2 sprays into both nostrils daily. 09/04/21   Henderly, Britni A, PA-C  hydrocortisone cream 1 % Apply to affected area 2 times daily 02/17/21   Couture, Cortni S, PA-C  ibuprofen (ADVIL) 600 MG tablet Take 1 tablet (600 mg total) by mouth every 6 (six) hours as needed for mild pain. 12/03/20   Nicholas Lose, MD  metroNIDAZOLE (FLAGYL) 500 MG tablet Take 1 tablet (500 mg total) by mouth 2 (two) times daily. 01/16/22   Ward, Lenise Arena, PA-C  SYMBICORT 80-4.5 MCG/ACT inhaler Inhale 2 puffs into the lungs 3 (three) times daily. 11/14/19   [provider]    Family History Family History  Problem Relation Age of Onset   Migraines Daughter    Hypertension Mother    Heart disease Mother    Hypertension Father    Heart disease Father    Migraines Son    Hypertension Other    Colon cancer Neg Hx    Colon polyps Neg Hx    Diabetes Neg Hx    Kidney disease Neg Hx    Liver disease Neg Hx    Heart attack Neg Hx    Stroke Neg Hx     Social History Social History   Tobacco Use   Smoking status: Former    Packs/day: 0.50    Types: Cigarettes    Quit date: 07/16/2017    Years since quitting: 4.8   Smokeless tobacco: Never   Tobacco comments:    smokes 1cigarette per day and more if she is stressed  Vaping Use   Vaping Use: Never used   Substance Use Topics   Alcohol use: No    Alcohol/week: 0.0 standard drinks of alcohol   Drug use: Not Currently    Types: Marijuana     Allergies   Shellfish allergy, Tea, and Tomato   Review of Systems Review of Systems  Gastrointestinal:  Positive for abdominal pain and diarrhea.   Per HPI  Physical Exam Triage Vital Signs ED Triage Vitals  Enc Vitals Group  BP 05/29/22 1626 132/80     Pulse Rate 05/29/22 1626 (!) 55     Resp 05/29/22 1626 16     Temp 05/29/22 1626 98.3 F (36.8 C)     Temp Source 05/29/22 1626 Oral     SpO2 05/29/22 1626 100 %     Weight --      Height --      Head Circumference --      Peak Flow --      Pain Score 05/29/22 1628 10     Pain Loc --      Pain Edu? --      Excl. in [place]? --    No data found.  Updated Vital Signs BP 132/80   Pulse (!) 55   Temp 98.3 F (36.8 C) (Oral)   Resp 16   LMP 08/17/2016 (Approximate)   SpO2 100%    Physical Exam Vitals and nursing note reviewed.  Constitutional:      Appearance: Normal appearance.  HENT:     Mouth/Throat:     Mouth: Mucous membranes are moist.     Pharynx: Oropharynx is clear. No posterior oropharyngeal erythema.  Eyes:     Conjunctiva/sclera: Conjunctivae normal.  Cardiovascular:     Rate and Rhythm: Normal rate and regular rhythm.     Pulses: Normal pulses.     Heart sounds: Normal heart sounds.  Pulmonary:     Effort: Pulmonary effort is normal. No respiratory distress.     Breath sounds: Normal breath sounds.  Abdominal:     General: Bowel sounds are normal.     Palpations: Abdomen is soft.     Tenderness: There is abdominal tenderness. There is no right CVA tenderness, left CVA tenderness, guarding or rebound.     Comments: Generalized tenderness and crampy feeling  Lymphadenopathy:     Cervical: No cervical adenopathy.  Skin:    General: Skin is warm and dry.  Neurological:     Mental Status: She is alert and oriented to person, place, and time.     UC  Treatments / Results  Labs (all labs ordered are listed, but only abnormal results are displayed) Labs Reviewed  POCT URINALYSIS DIPSTICK, ED / UC - Abnormal; Notable for the following components:      Result Value   Hgb urine dipstick TRACE (*)    All other components within normal limits    EKG  Radiology No results found.  Procedures Procedures  Medications Ordered in UC Medications  acetaminophen (TYLENOL) tablet 650 mg (650 mg Oral Given 05/29/22 1729)  ondansetron (ZOFRAN-ODT) disintegrating tablet 4 mg (4 mg Oral Given 05/29/22 1729)    Initial Impression / Assessment and Plan / UC Course  I have reviewed the triage vital signs and the nursing notes.  Pertinent labs & imaging results that were available during my care of the patient were reviewed by me and considered in my medical decision making (see chart for details).  Urinalysis trace hemoglobin, unremarkable.  Likely gastroenteritis, especially with close contacts having similar symptoms.  Gave Tylenol dose for body aches as patient reported she was in pain.  Symptoms improved with medicine.  Additionally gave Zofran for abdominal cramping, nausea.  Sent Zofran to use as needed over the next few days.  Discussed the most important thing she can do is continue to stay hydrated.  If at any point she feels she cannot tolerate water by mouth, she needs to go to the emergency department.  Discussed  that symptoms may last a few more days. Return precautions discussed. Patient agrees to plan  Final Clinical Impressions(s) / UC Diagnoses   Final diagnoses:  Gastroenteritis     Discharge Instructions      You may have a stomach virus. For your abdominal pain/cramping, you can try the zofran (nausea medicine) every 8 hours. It dissolves under the tongue like the medicine we gave you in clinic today.  For your body aches, you can try tylenol.  I recommend increasing your water intake to stay very hydrated. It's important  to replenish all the fluids you're losing with diarrhea.   Please go to the emergency department if symptoms worsen.    ED Prescriptions     Medication Sig Dispense Auth. Provider   ondansetron (ZOFRAN-ODT) 4 MG disintegrating tablet Take 1 tablet (4 mg total) by mouth every 8 (eight) hours as needed for nausea or vomiting. 20 tablet Karcyn Menn, Wells Guiles, PA-C      PDMP not reviewed this encounter.   Mistie Adney, Vernice Jefferson 05/29/22 2016

## 2022-05-29 NOTE — Discharge Instructions (Addendum)
You may have a stomach virus. For your abdominal pain/cramping, you can try the zofran (nausea medicine) every 8 hours. It dissolves under the tongue like the medicine we gave you in clinic today.  For your body aches, you can try tylenol.  I recommend increasing your water intake to stay very hydrated. It's important to replenish all the fluids you're losing with diarrhea.   Please go to the emergency department if symptoms worsen.

## 2022-05-30 ENCOUNTER — Ambulatory Visit (INDEPENDENT_AMBULATORY_CARE_PROVIDER_SITE_OTHER): Payer: Medicaid Other

## 2022-05-30 DIAGNOSIS — R002 Palpitations: Secondary | ICD-10-CM

## 2022-06-22 NOTE — Progress Notes (Signed)
Carelink Summary Report / Loop Recorder 

## 2022-07-03 ENCOUNTER — Ambulatory Visit (INDEPENDENT_AMBULATORY_CARE_PROVIDER_SITE_OTHER): Payer: Medicaid Other

## 2022-07-03 DIAGNOSIS — R002 Palpitations: Secondary | ICD-10-CM | POA: Diagnosis not present

## 2022-07-04 ENCOUNTER — Other Ambulatory Visit: Payer: Self-pay | Admitting: Hematology and Oncology

## 2022-07-04 LAB — CUP PACEART REMOTE DEVICE CHECK
Date Time Interrogation Session: 20231008231355
Implantable Pulse Generator Implant Date: 20210608

## 2022-07-12 NOTE — Progress Notes (Signed)
Carelink Summary Report / Loop Recorder 

## 2022-07-25 ENCOUNTER — Encounter: Payer: Self-pay | Admitting: Internal Medicine

## 2022-07-25 ENCOUNTER — Ambulatory Visit: Payer: Medicaid Other | Attending: Internal Medicine | Admitting: Internal Medicine

## 2022-07-25 VITALS — BP 112/70 | HR 67 | Ht 65.0 in | Wt 132.4 lb

## 2022-07-25 DIAGNOSIS — R002 Palpitations: Secondary | ICD-10-CM

## 2022-07-25 NOTE — Progress Notes (Signed)
HPI [name] returns today for followup of her palpitations and to have her ILR removed.  She has preserved LV function. She presents today after undergoing ILR insertion just over a year ago. She has had some bradycardia but has not been symptomatic. She notes that her palpitations are improved. No chest pain or sob. She would like her ILR removed. Allergies  Allergen Reactions   Shellfish Allergy Anaphylaxis   Tea Nausea And Vomiting   Tomato Rash     Current Outpatient Medications  Medication Sig Dispense Refill   albuterol (PROVENTIL HFA;VENTOLIN HFA) 108 (90 Base) MCG/ACT inhaler Inhale 2 puffs into the lungs every 6 (six) hours as needed for wheezing or shortness of breath.     DULoxetine (CYMBALTA) 30 MG capsule TAKE 1 CAPSULE (30 MG TOTAL) BY MOUTH DAILY. X 1 WEEK THEN TAKE 2 CAPSULES DAILY (Patient taking differently: Take 60 mg by mouth daily.) 60 capsule 5   fluticasone (FLONASE) 50 MCG/ACT nasal spray Place 2 sprays into both nostrils daily. 9.9 mL 0   hydrocortisone cream 1 % Apply to affected area 2 times daily 15 g 0   ibuprofen (ADVIL) 600 MG tablet TAKE 1 TABLET (600 MG TOTAL) BY MOUTH EVERY 6 (SIX) HOURS AS NEEDED FOR MILD PAIN 60 tablet 3   metroNIDAZOLE (FLAGYL) 500 MG tablet Take 1 tablet (500 mg total) by mouth 2 (two) times daily. 14 tablet 0   omeprazole (PRILOSEC) 40 MG capsule Take 40 mg by mouth daily at 2 PM.     ondansetron (ZOFRAN-ODT) 4 MG disintegrating tablet Take 1 tablet (4 mg total) by mouth every 8 (eight) hours as needed for nausea or vomiting. 20 tablet 0   SYMBICORT 80-4.5 MCG/ACT inhaler Inhale 2 puffs into the lungs 3 (three) times daily.     No current facility-administered medications for this visit.     Past Medical History:  Diagnosis Date   Anxiety    Asthma    Dysrhythmia    History of SVT-No medications   Fibromyalgia    GERD (gastroesophageal reflux disease)    History of radiation therapy 12/19/17- 01/15/18   Right Breast,  2.67 Gy in 15 fractions for a total dose of 40.05 Gy. 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   Past Surgical History:  Procedure Laterality Date   ABDOMINAL HYSTERECTOMY     BREAST LUMPECTOMY Right    BREAST LUMPECTOMY WITH RADIOACTIVE SEED AND SENTINEL LYMPH NODE BIOPSY Right 07/24/2017   Procedure: RIGHT BREAST LUMPECTOMY WITH RADIOACTIVE SEED AND SENTINEL LYMPH NODE BIOPSY;  Surgeon: Erroll Luna, MD;  Location: [facility];  Service: General;  Laterality: Right;   PORT-A-CATH REMOVAL Right 06/18/2019   Procedure: REMOVAL PORT-A-CATH;  Surgeon: Erroll Luna, MD;  Location: [facility];  Service: General;  Laterality: Right;   PORTACATH PLACEMENT Right 07/24/2017  Procedure: INSERTION PORT-A-CATH;  Surgeon: Erroll Luna, MD;  Location: [facility];  Service: General;  Laterality: Right;   [place] Bilateral 12/19/2016   Procedure: HYSTERECTOMY VAGINAL;  Surgeon: Lavonia Drafts, MD;  Location: [facility];  Service: Gynecology;  Laterality: Bilateral;   WISDOM TOOTH EXTRACTION       Family History  Problem Relation Age of Onset   Migraines Daughter    Hypertension Mother    Heart disease Mother    Hypertension Father    Heart disease Father    Migraines Son    Hypertension Other    Colon cancer Neg Hx    Colon polyps Neg Hx    Diabetes Neg Hx    Kidney disease Neg Hx    Liver disease Neg Hx     Heart attack Neg Hx    Stroke Neg Hx      Social History   Socioeconomic History   Marital status: Married    Spouse name: Not on file   Number of children: 2   Years of education: 15   Highest education level: Not on file  Occupational History   Occupation: unemployed  Tobacco Use   Smoking status: Former    Packs/day: 0.50    Types: Cigarettes    Quit date: 07/16/2017    Years since quitting: 5.0   Smokeless tobacco: Never   Tobacco comments:    smokes 1cigarette per day and more if she is stressed  Vaping Use   Vaping Use: Never used  Substance and Sexual Activity   Alcohol use: No    Alcohol/week: 0.0 standard drinks of alcohol   Drug use: Not Currently    Types: Marijuana   Sexual activity: Not Currently    Birth control/protection: Surgical  Other Topics Concern   Not on file  Social History Narrative   On disability for heart condition.  Pt. Was non-specific.       Patient does not drink caffeine.   Patient is ambidextrous, but mostly right handed.    12th grade, some college courses   Social Determinants of Health   Financial Resource Strain: Not on file  Food Insecurity: Not on file  Transportation Needs: Not on file  Physical Activity: Not on file  Stress: Not on file  Social Connections: Not on file  Intimate Partner Violence: Not on file     BP 112/70   Pulse 67   Ht '5\' 5"'$  (1.651 m)   Wt 132 lb 6.4 oz (60.1 kg)   LMP 08/17/2016 (Approximate)   SpO2 98%   BMI 22.03 kg/m   Physical Exam:  Well appearing NAD HEENT: Unremarkable Neck:  No JVD, no thyromegally Lymphatics:  No adenopathy Back:  No CVA tenderness Lungs:  Clear with no wheezes HEART:  Regular rate rhythm, no murmurs, no rubs, no clicks Abd:  soft, positive bowel sounds, no organomegally, no rebound, no guarding Ext:  2 plus pulses, no edema, no cyanosis, no clubbing Skin:  No rashes no nodules Neuro:  CN II through XII intact, motor grossly intact  DEVICE  Normal  device function.  See PaceArt for details.   Assess/Plan:   1. Palpitations - the etiology is unclear. Her symptoms have improved. 2. HTN - her bp is well controlled.  3. ILR - her device is working normally. No evidence of symptomatic brady. We will remove her ILR today.   EP Procedure Note  Preoperative diagnosis: Palpitations  Postop  diagnosis: same as preop  Procedure performed: ILR removal.  Description of the procedure: after informed consent was obtained the patient was prepped and draped in a sterile manner. 5 cc of lidocaine was infiltrated. A one cm incision was carried out. A combination of blunt and sharp dissection was utilized to grasp the ILR. The scar tissue was freed up with sharp dissection. The ILR was removed. Benzoin and steri-strips were painted on the skin. A bandage was applied.    Carleene Overlie Khyri Hinzman,MD

## 2022-07-25 NOTE — Patient Instructions (Addendum)
Medication Instructions:  Your physician recommends that you continue on your current medications as directed. Please refer to the Current Medication list given to you today.  Labwork: None ordered.  Testing/Procedures: None ordered.  Follow-Up:  Your physician wants you to follow-up in: one year with Dr. Lovena Le or an APP.  You will receive a reminder letter in the mail two months in advance. If you don't receive a letter, please call our office to schedule the follow-up appointment.    Implantable Loop Recorder Removal, Care After This sheet gives you information about how to care for yourself after your procedure. Your health care provider may also give you more specific instructions. If you have problems or questions, contact your health care provider. What can I expect after the procedure? After the procedure, it is common to have: Soreness or discomfort near the incision. Some swelling or bruising near the incision.  Follow these instructions at home: Incision care  Monitor your cardiac device site for redness, swelling, and drainage. Call the device clinic at [phone] if you experience these symptoms or fever/chills.  Keep the large square bandage on your site for 24 hours and then you may remove it yourself. Keep the steri-strips underneath in place.   You may shower after 72 hours / 3 days from your procedure with the steri-strips in place. They will usually fall off on their own, or may be removed after 10 days. Pat dry.   Avoid lotions, ointments, or perfumes over your incision until it is well-healed.  Please do not submerge in water until your site is completely healed.   If your wound site starts to bleed apply pressure.       If you have any questions/concerns please call the device clinic at [phone].  Activity  Return to your normal activities.  Contact a health care provider if: You have redness, swelling, or pain around your incision. You have a  fever.

## 2022-07-27 ENCOUNTER — Ambulatory Visit: Payer: Medicaid Other | Attending: Cardiology

## 2022-08-22 ENCOUNTER — Encounter (HOSPITAL_COMMUNITY): Payer: Self-pay | Admitting: Emergency Medicine

## 2022-08-22 ENCOUNTER — Emergency Department (HOSPITAL_COMMUNITY): Payer: Medicaid Other

## 2022-08-22 ENCOUNTER — Other Ambulatory Visit: Payer: Self-pay

## 2022-08-22 ENCOUNTER — Emergency Department (HOSPITAL_COMMUNITY)
Admission: EM | Admit: 2022-08-22 | Discharge: 2022-08-23 | Disposition: A | Discharge status: Home / Self Care | Payer: Medicaid Other | Attending: Emergency Medicine | Admitting: Emergency Medicine

## 2022-08-22 DIAGNOSIS — R110 Nausea: Secondary | ICD-10-CM | POA: Insufficient documentation

## 2022-08-22 DIAGNOSIS — R079 Chest pain, unspecified: Secondary | ICD-10-CM | POA: Insufficient documentation

## 2022-08-22 DIAGNOSIS — R109 Unspecified abdominal pain: Secondary | ICD-10-CM | POA: Diagnosis present

## 2022-08-22 DIAGNOSIS — R1013 Epigastric pain: Secondary | ICD-10-CM | POA: Diagnosis not present

## 2022-08-22 DIAGNOSIS — R059 Cough, unspecified: Secondary | ICD-10-CM | POA: Insufficient documentation

## 2022-08-22 LAB — CBC
HCT: 39.3 % (ref 36.0–46.0)
Hemoglobin: 12.5 g/dL (ref 12.0–15.0)
MCH: 27.1 pg (ref 26.0–34.0)
MCHC: 31.8 g/dL (ref 30.0–36.0)
MCV: 85.2 fL (ref 80.0–100.0)
Platelets: 188 10*3/uL (ref 150–400)
RBC: 4.61 MIL/uL (ref 3.87–5.11)
RDW: 14.9 % (ref 11.5–15.5)
WBC: 5.6 10*3/uL (ref 4.0–10.5)
nRBC: 0 % (ref 0.0–0.2)

## 2022-08-22 LAB — COMPREHENSIVE METABOLIC PANEL
ALT: 15 U/L (ref 0–44)
AST: 20 U/L (ref 15–41)
Albumin: 4.1 g/dL (ref 3.5–5.0)
Alkaline Phosphatase: 58 U/L (ref 38–126)
Anion gap: 9 (ref 5–15)
BUN: 6 mg/dL (ref 6–20)
CO2: 25 mmol/L (ref 22–32)
Calcium: 9.2 mg/dL (ref 8.9–10.3)
Chloride: 104 mmol/L (ref 98–111)
Creatinine, Ser: 0.46 mg/dL (ref 0.44–1.00)
GFR, Estimated: >60 mL/min (ref >60)
Glucose, Bld: 86 mg/dL (ref 70–99)
Potassium: 4 mmol/L (ref 3.5–5.1)
Sodium: 138 mmol/L (ref 135–145)
Total Bilirubin: 0.4 mg/dL (ref 0.3–1.2)
Total Protein: 6.8 g/dL (ref 6.5–8.1)

## 2022-08-22 LAB — TROPONIN I (HIGH SENSITIVITY)
Troponin I (High Sensitivity): 3 ng/L (ref <18)
Troponin I (High Sensitivity): <2 ng/L (ref <18)

## 2022-08-22 LAB — URINALYSIS, ROUTINE W REFLEX MICROSCOPIC
Bilirubin Urine: NEGATIVE
Glucose, UA: NEGATIVE mg/dL
Hgb urine dipstick: NEGATIVE
Ketones, ur: NEGATIVE mg/dL
Leukocytes,Ua: NEGATIVE
Nitrite: NEGATIVE
Protein, ur: NEGATIVE mg/dL
Specific Gravity, Urine: 1.015 (ref 1.005–1.030)
pH: 7 (ref 5.0–8.0)

## 2022-08-22 LAB — LIPASE, BLOOD: Lipase: 29 U/L (ref 11–51)

## 2022-08-22 MED ORDER — FAMOTIDINE IN NACL 20-0.9 MG/50ML-% IV SOLN
20.0000 mg | Freq: Once | INTRAVENOUS | Status: AC
  Administered 2022-08-22: 20 mg via INTRAVENOUS
  Filled 2022-08-22: qty 50

## 2022-08-22 MED ORDER — ACETAMINOPHEN 325 MG PO TABS
650.0000 mg | ORAL_TABLET | Freq: Once | ORAL | Status: AC
  Administered 2022-08-22: 650 mg via ORAL
  Filled 2022-08-22: qty 2

## 2022-08-22 NOTE — ED Provider Triage Note (Signed)
Emergency Medicine Provider Triage Evaluation Note  Katherine [name] , a 51 y.o. female  was evaluated in triage.  Pt complains of abdominal pain and chest pain.  She reports that she originally came to the emergency department due to abdominal pain.  Since being in the lobby she started to complain of chest pain.  Localizes the pain to her umbilical area.  History of C-section but no other abdominal surgeries.  Says that she also feels as though she needs to throw up.  Also endorsing some diarrhea  Review of Systems  Positive:  Negative:   Physical Exam  BP (!) 133/90   Pulse (!) 59   Temp 98.7 F (37.1 C)   Resp 17   LMP 08/17/2016 (Approximate)   SpO2 100%  Gen:   Awake, no distress   Resp:  Normal effort  MSK:   Moves extremities without difficulty  Other:  Abdomen soft and generally tender  Medical Decision Making  Medically screening exam initiated at 11:02 AM.  Appropriate orders placed.  [name] was informed that the remainder of the evaluation will be completed by another provider, this initial triage assessment does not replace that evaluation, and the importance of remaining in the ED until their evaluation is complete.     Rhae Hammock, PA-C 08/22/22 1103

## 2022-08-22 NOTE — ED Provider Notes (Signed)
[facility] Emergency Department Provider Note MRN:  [id number]  Arrival date & time: 08/23/22     Chief Complaint   Abdominal Pain and Chest Pain   History of Present Illness   Katherine [name] is a 51 y.o. year-old female presents to the ED with chief complaint of abdominal pain and chest pain.  Abdominal pain started yesterday.  States that she was at work and started feeling nauseated.  Symptoms worsened throughout the night. She denies vomiting or diarrhea.  Denies fever or dysuria.    She also states that she began having chest pain when she arrived today.  She states that she feels out of breath.  She reports slight cough.  History provided by patient.   Review of Systems  Pertinent positive and negative review of systems noted in HPI.    Physical Exam   Vitals:   08/22/22 2315 08/23/22 0050  BP: 138/88 (!) 130/91  Pulse: 84 68  Resp: 18 16  Temp:  98.4 F (36.9 C)  SpO2: 100% 99%    CONSTITUTIONAL:  well-appearing, NAD NEURO:  Alert and oriented x 3, CN 3-12 grossly intact EYES:  eyes equal and reactive ENT/NECK:  Supple, no stridor  CARDIO:  mild bradycardia, regular rhythm, appears well-perfused  PULM:  No respiratory distress,  GI/GU:  non-distended, no focal tenderness, mild epigastric discomfort MSK/SPINE:  No gross deformities, no edema, moves all extremities  SKIN:  no rash, atraumatic   *Additional and/or pertinent findings included in MDM below  Diagnostic and Interventional Summary    EKG Interpretation  Date/Time:  Tuesday August 22 2022 10:19:56 EST Ventricular Rate:  59 PR Interval:  186 QRS Duration: 76 QT Interval:  402 QTC Calculation: 397 R Axis:   137 Text Interpretation: Sinus bradycardia Low voltage QRS Septal infarct , age undetermined Abnormal ECG When compared with ECG of 19-Jul-2020 08:31, No significant change since last tracing Confirmed by Pattricia Boss [phone]) on 08/22/2022 2:20:36 PM       Labs  Reviewed  LIPASE, BLOOD  COMPREHENSIVE METABOLIC PANEL  CBC  URINALYSIS, ROUTINE W REFLEX MICROSCOPIC  TROPONIN I (HIGH SENSITIVITY)  TROPONIN I (HIGH SENSITIVITY)    CT ABDOMEN PELVIS W CONTRAST  Final Result    DG Chest 2 View  Final Result      Medications  acetaminophen (TYLENOL) tablet 650 mg (650 mg Oral Given 08/22/22 2236)  famotidine (PEPCID) IVPB 20 mg premix (0 mg Intravenous Stopped 08/22/22 2313)  iohexol (OMNIPAQUE) 350 MG/ML injection 75 mL (75 mLs Intravenous Contrast Given 08/23/22 0029)     Procedures  /  Critical Care Procedures  ED Course and Medical Decision Making  I have reviewed the triage vital signs, the nursing notes, and pertinent available records from the EMR.  Social Determinants Affecting Complexity of Care: Patient has no clinically significant social determinants affecting this chief complaint..   ED Course:    Medical Decision Making Patient here with epigastric abdominal pain that radiates into the chest.  She states that the symptoms started yesterday.  She states that the chest pain started today when she got here.  She states she has had some slight diarrhea.  She is nontoxic on my exam.  She looks well.  Laboratory workup initiated in triage.  I have added on a CT abdomen/pelvis.  Labs are reassuring and are discussed below.  Imaging is also reassuring.  Patient reports significant history of GERD and acid reflux.  Question whether her symptoms might be  related to stomach ulcer or GERD.  Will trial treatment with Carafate, omeprazole, and will give Zofran for nausea.  Patient is encouraged to follow-up with her PCP.  Amount and/or Complexity of Data Reviewed Labs: ordered.    Details: Troponin is 3-> 2.  Doubt ACS. Lipase is normal at 29, doubt pancreatitis.  No significant leukocytosis or electrolyte derangement. Radiology: ordered and independent interpretation performed.    Details: No free air  Risk Prescription drug  management. Decision regarding hospitalization.     Consultants: No consultations were needed in caring for this patient.   Treatment and Plan: I considered admission due to patient's initial presentation, but after considering the examination and diagnostic results, patient will not require admission and can be discharged with outpatient follow-up.    Final Clinical Impressions(s) / ED Diagnoses     ICD-10-CM   1. Epigastric pain  R10.13       ED Discharge Orders          Ordered    omeprazole (PRILOSEC) 20 MG capsule  Daily        08/23/22 0138    sucralfate (CARAFATE) 1 g tablet  3 times daily with meals & bedtime        08/23/22 0138    ondansetron (ZOFRAN-ODT) 4 MG disintegrating tablet  Every 8 hours PRN        08/23/22 0138              Discharge Instructions Discussed with and Provided to Patient:   Discharge Instructions   None      Montine Circle, PA-C 08/23/22 0208    Lennice Sites, DO 08/23/22 1605

## 2022-08-22 NOTE — ED Triage Notes (Addendum)
Pt states started having abd pain yesterday while she was at work. Pt feels nauseous but no vomiting. Pt states the pain has moved into her chest since she arrived here. Pt now tell this RN that the pain moves into her rectum and down her right leg.

## 2022-08-23 ENCOUNTER — Emergency Department (HOSPITAL_COMMUNITY): Payer: Medicaid Other

## 2022-08-23 MED ORDER — ONDANSETRON 4 MG PO TBDP: 4.0000 mg | ORAL_TABLET | Freq: Three times a day (TID) | ORAL | 0 refills | Status: DC | PRN

## 2022-08-23 MED ORDER — IOHEXOL 350 MG/ML SOLN
75.0000 mL | Freq: Once | INTRAVENOUS | Status: AC | PRN
  Administered 2022-08-23: 75 mL via INTRAVENOUS

## 2022-08-23 MED ORDER — OMEPRAZOLE 20 MG PO CPDR: 20.0000 mg | DELAYED_RELEASE_CAPSULE | Freq: Every day | ORAL | 0 refills | Status: DC

## 2022-08-23 MED ORDER — SUCRALFATE 1 G PO TABS: 1.0000 g | ORAL_TABLET | Freq: Three times a day (TID) | ORAL | 0 refills | Status: DC

## 2022-10-16 ENCOUNTER — Inpatient Hospital Stay: Payer: Medicaid Other | Attending: Hematology and Oncology | Admitting: Hematology and Oncology

## 2022-10-16 NOTE — Assessment & Plan Note (Deleted)
07/24/2017:Right lumpectomy: IDC grade 3, 1.8 cm, 0/2 lymph nodes negative,ER 0%, PR 0%, Ki-67 40%, HER-2 positive ratio 3.59, T1CN0 stage I a    Recommendation: 1.  Adjuvant chemotherapy with Taxol Herceptin completed 11/26/2017 followed by Herceptin maintenance for 1 year completed 08/26/2018 2. followed by adjuvant radiation therapy 12/19/2017 to 01/15/2018 ------------------------------------------------------------------------------------------------------------------------------------ 11/29/2019 CT abdomen and pelvis performed to evaluate back pain: No evidence of metastatic disease. Severe diffuse arthralgias and myalgias:     Right breast pain: Could be related to fibromyalgia.   Breast cancer surveillance:   1.  Mammogram and ultrasound 12/27/2020: : Benign breast density category D 2. breast exam 10/18/2022: Benign 3.  Brain MRI 10/27/2021: Benign 4.  CT CAP 08/23/2022: Benign   Return to clinic in 1 year for follow-up.

## 2022-10-16 NOTE — Progress Notes (Incomplete)
Patient Care Team: Drue Flirt, MD as PCP - General (Family Medicine) Fay Records, MD as PCP - Cardiology (Cardiology) Evans Lance, MD as PCP - Electrophysiology (Cardiology) Erroll Luna, MD as Consulting Physician (General Surgery) Nicholas Lose, MD as Consulting Physician (Hematology and Oncology) Eppie Gibson, MD as Attending Physician (Radiation Oncology)  DIAGNOSIS:  Encounter Diagnosis  Name Primary?   Malignant neoplasm of upper-outer quadrant of right breast in female, estrogen receptor negative ([place]) Yes    SUMMARY OF ONCOLOGIC HISTORY: Oncology History  Malignant neoplasm of upper-outer quadrant of right breast in female, estrogen receptor negative ([place])  06/26/2017 Initial Diagnosis   Right breast biopsy 8:30 position 8 cm from nipple: IDC with DCIS, second biopsy 6 cm from nipple fibrocystic changes: Grade 3, ER 0%, PR 0%, Ki-67 40%, HER-2 positive ratio 3.59; mammogram and ultrasound revealed 1.9 cm mass at 8:30 position, adjacent 0.7 cm cystic mass, T1c N0 stage IA AJCC 8    07/24/2017 Surgery   Right lumpectomy: IDC grade 3, 1.8 cm, 0/2 lymph nodes negative,ER 0%, PR 0%, Ki-67 40%, HER-2 positive ratio 3.59, T1CN0 stage I a    09/10/2017 - 11/26/2017 Chemotherapy   Taxol Herceptin weekly x12 followed by Herceptin maintenance for 1 year   12/19/2017 - 01/15/2018 Radiation Therapy   Adjuvant radiation therapy     CHIEF COMPLIANT: Follow-up of right breast cancer   INTERVAL HISTORY: Katherine [name] is a 52 y.o. with above-mentioned history of right breast cancer treated with lumpectomy, adjuvant chemotherapy, radiation, Herceptin maintenance and who is currently on surveillance. She presents to the clinic for a follow-up.    ALLERGIES:  is allergic to shellfish allergy, tea, and tomato.  MEDICATIONS:  Current Outpatient Medications  Medication Sig Dispense Refill   albuterol (PROVENTIL HFA;VENTOLIN HFA) 108 (90 Base) MCG/ACT inhaler Inhale 2 puffs  into the lungs every 6 (six) hours as needed for wheezing or shortness of breath.     DULoxetine (CYMBALTA) 30 MG capsule TAKE 1 CAPSULE (30 MG TOTAL) BY MOUTH DAILY. X 1 WEEK THEN TAKE 2 CAPSULES DAILY (Patient taking differently: Take 60 mg by mouth daily.) 60 capsule 5   fluticasone (FLONASE) 50 MCG/ACT nasal spray Place 2 sprays into both nostrils daily. 9.9 mL 0   hydrocortisone cream 1 % Apply to affected area 2 times daily 15 g 0   ibuprofen (ADVIL) 600 MG tablet TAKE 1 TABLET (600 MG TOTAL) BY MOUTH EVERY 6 (SIX) HOURS AS NEEDED FOR MILD PAIN 60 tablet 3   metroNIDAZOLE (FLAGYL) 500 MG tablet Take 1 tablet (500 mg total) by mouth 2 (two) times daily. 14 tablet 0   omeprazole (PRILOSEC) 20 MG capsule Take 1 capsule (20 mg total) by mouth daily. 30 capsule 0   ondansetron (ZOFRAN-ODT) 4 MG disintegrating tablet Take 1 tablet (4 mg total) by mouth every 8 (eight) hours as needed for nausea or vomiting. 10 tablet 0   sucralfate (CARAFATE) 1 g tablet Take 1 tablet (1 g total) by mouth 4 (four) times daily -  with meals and at bedtime. 120 tablet 0   SYMBICORT 80-4.5 MCG/ACT inhaler Inhale 2 puffs into the lungs 3 (three) times daily.     No current facility-administered medications for this visit.    PHYSICAL EXAMINATION: ECOG PERFORMANCE STATUS: {CHL ONC ECOG PS:[phone]}  There were no vitals filed for this visit. There were no vitals filed for this visit.  BREAST:*** No palpable masses or nodules in either right or left breasts.  No palpable axillary supraclavicular or infraclavicular adenopathy no breast tenderness or nipple discharge. (exam performed in the presence of a chaperone)  LABORATORY DATA:  I have reviewed the data as listed    Latest Ref Rng & Units 08/22/2022   10:48 AM 10/13/2021    1:12 PM 07/19/2020    8:06 AM  CMP  Glucose 70 - 99 mg/dL 86  87  94   BUN 6 - 20 mg/dL '6  10  7   '$ Creatinine 0.44 - 1.00 mg/dL 0.46  0.64  0.56   Sodium 135 - 145 mmol/L 138  140   140   Potassium 3.5 - 5.1 mmol/L 4.0  3.9  3.6   Chloride 98 - 111 mmol/L 104  106  108   CO2 22 - 32 mmol/L '25  29  24   '$ Calcium 8.9 - 10.3 mg/dL 9.2  9.2  9.0   Total Protein 6.5 - 8.1 g/dL 6.8  6.3    Total Bilirubin 0.3 - 1.2 mg/dL 0.4  0.3    Alkaline Phos 38 - 126 U/L 58  54    AST 15 - 41 U/L 20  12    ALT 0 - 44 U/L 15  7      Lab Results  Component Value Date   WBC 5.6 08/22/2022   HGB 12.5 08/22/2022   HCT 39.3 08/22/2022   MCV 85.2 08/22/2022   PLT 188 08/22/2022   NEUTROABS 2.1 10/13/2021    ASSESSMENT & PLAN:  No problem-specific Assessment & Plan notes found for this encounter.    No orders of the defined types were placed in this encounter.  The patient has a good understanding of the overall plan. she agrees with it. she will call with any problems that may develop before the next visit here. Total time spent: 30 mins including face to face time and time spent for planning, charting and co-ordination of care   Suzzette Righter, [place] 10/16/22    I Gardiner Coins am acting as a Education administrator for [employer]  ***

## 2022-10-22 ENCOUNTER — Emergency Department (HOSPITAL_COMMUNITY): Payer: Medicaid Other

## 2022-10-22 ENCOUNTER — Other Ambulatory Visit: Payer: Self-pay

## 2022-10-22 ENCOUNTER — Other Ambulatory Visit (HOSPITAL_COMMUNITY): Payer: Medicaid Other

## 2022-10-22 ENCOUNTER — Emergency Department (HOSPITAL_COMMUNITY)
Admission: EM | Admit: 2022-10-22 | Discharge: 2022-10-23 | Disposition: A | Discharge status: Home / Self Care | Payer: Medicaid Other | Attending: Emergency Medicine | Admitting: Emergency Medicine

## 2022-10-22 DIAGNOSIS — Z853 Personal history of malignant neoplasm of breast: Secondary | ICD-10-CM | POA: Insufficient documentation

## 2022-10-22 DIAGNOSIS — R0789 Other chest pain: Secondary | ICD-10-CM | POA: Insufficient documentation

## 2022-10-22 DIAGNOSIS — R0602 Shortness of breath: Secondary | ICD-10-CM | POA: Insufficient documentation

## 2022-10-22 LAB — CBC WITH DIFFERENTIAL/PLATELET
Abs Immature Granulocytes: 0.02 10*3/uL (ref 0.00–0.07)
Basophils Absolute: 0 10*3/uL (ref 0.0–0.1)
Basophils Relative: 0 %
Eosinophils Absolute: 0.2 10*3/uL (ref 0.0–0.5)
Eosinophils Relative: 3 %
HCT: 38.6 % (ref 36.0–46.0)
Hemoglobin: 12.6 g/dL (ref 12.0–15.0)
Immature Granulocytes: 0 %
Lymphocytes Relative: 60 %
Lymphs Abs: 4.5 10*3/uL — ABNORMAL HIGH (ref 0.7–4.0)
MCH: 27.9 pg (ref 26.0–34.0)
MCHC: 32.6 g/dL (ref 30.0–36.0)
MCV: 85.6 fL (ref 80.0–100.0)
Monocytes Absolute: 0.5 10*3/uL (ref 0.1–1.0)
Monocytes Relative: 6 %
Neutro Abs: 2.3 10*3/uL (ref 1.7–7.7)
Neutrophils Relative %: 31 %
Platelets: 188 10*3/uL (ref 150–400)
RBC: 4.51 MIL/uL (ref 3.87–5.11)
RDW: 13.9 % (ref 11.5–15.5)
WBC: 7.5 10*3/uL (ref 4.0–10.5)
nRBC: 0.3 % — ABNORMAL HIGH (ref 0.0–0.2)

## 2022-10-22 NOTE — ED Provider Triage Note (Signed)
Emergency Medicine Provider Triage Evaluation Note  Katherine [name] , a 52 y.o. female  was evaluated in triage.  Pt complains of pain in her left chest wall behind her left breast ongoing for about a week, now with some intermittent sharp pains shooting down her left leg.  States that chest pain comes and goes, patient unable to identify any alleviating or aggravating factors..  Review of Systems  Positive: As Above Negative: As above  Physical Exam  BP (!) 121/90   Pulse 63   Temp 98.6 F (37 C) (Oral)   Resp 18   LMP 08/17/2016 (Approximate)   SpO2 99%  Gen:   Awake, no distress   Resp:  Normal effort  MSK:   Moves extremities without difficulty  Other:  RRR no M/R/G.  Tenderness palpation of the left chest wall  Medical Decision Making  Medically screening exam initiated at 11:12 PM.  Appropriate orders placed.  [name] was informed that the remainder of the evaluation will be completed by another provider, this initial triage assessment does not replace that evaluation, and the importance of remaining in the ED until their evaluation is complete.  This chart was dictated using voice recognition software, Dragon. Despite the best efforts of this provider to proofread and correct errors, errors may still occur which can change documentation meaning.    Emeline Darling, PA-C 10/22/22 2320

## 2022-10-22 NOTE — ED Triage Notes (Signed)
Patient reports left breast pain onset this morning , denies injury or drainage , she adds intermittent left leg pain for several days . Ambulatory / respirations unlabored.

## 2022-10-23 LAB — COMPREHENSIVE METABOLIC PANEL
ALT: 16 U/L (ref 0–44)
AST: 19 U/L (ref 15–41)
Albumin: 3.9 g/dL (ref 3.5–5.0)
Alkaline Phosphatase: 73 U/L (ref 38–126)
Anion gap: 10 (ref 5–15)
BUN: 7 mg/dL (ref 6–20)
CO2: 22 mmol/L (ref 22–32)
Calcium: 8.8 mg/dL — ABNORMAL LOW (ref 8.9–10.3)
Chloride: 103 mmol/L (ref 98–111)
Creatinine, Ser: 0.59 mg/dL (ref 0.44–1.00)
GFR, Estimated: >60 mL/min (ref >60)
Glucose, Bld: 92 mg/dL (ref 70–99)
Potassium: 3.8 mmol/L (ref 3.5–5.1)
Sodium: 135 mmol/L (ref 135–145)
Total Bilirubin: 0.3 mg/dL (ref 0.3–1.2)
Total Protein: 6.4 g/dL — ABNORMAL LOW (ref 6.5–8.1)

## 2022-10-23 LAB — TROPONIN I (HIGH SENSITIVITY)
Troponin I (High Sensitivity): <2 ng/L (ref <18)
Troponin I (High Sensitivity): <2 ng/L (ref <18)

## 2022-10-23 MED ORDER — IBUPROFEN 400 MG PO TABS
600.0000 mg | ORAL_TABLET | Freq: Once | ORAL | Status: AC
  Administered 2022-10-23: 600 mg via ORAL
  Filled 2022-10-23: qty 1

## 2022-10-23 NOTE — ED Provider Notes (Signed)
[facility] Provider Note   CSN: [id number] Arrival date & time: 10/22/22  2223     History Chief Complaint  Patient presents with   Breast Pain / Leg Pain     Katherine [name] is a 52 y.o. female complains of pain in her left chest wall behind her left breast ongoing for about a week, now with some intermittent sharp pains shooting down her left leg. Did develop some mild SOB while in the ED.  States that chest pain comes and goes, patient unable to identify any alleviating or aggravating factors aside from deep inspiration. Pain primarily in the left lower ribs.  She states that she takes 4 mg ibuprofen in the morning before work and it seems to help though it wears off quickly and then her pain recurs.  Also states she is history of breast cancer but "I have not been having anybody look at my breast because I am not looking to find out something is wrong there again".  I have reviewed her medical records; she has history of anxiety , fibromyalgia, breast cancer in remission at this time. No anticoagulation.   HPI     Home Medications Prior to Admission medications   Medication Sig Start Date End Date Taking? Authorizing Provider  albuterol (PROVENTIL HFA;VENTOLIN HFA) 108 (90 Base) MCG/ACT inhaler Inhale 2 puffs into the lungs every 6 (six) hours as needed for wheezing or shortness of breath.    [provider]  DULoxetine (CYMBALTA) 30 MG capsule TAKE 1 CAPSULE (30 MG TOTAL) BY MOUTH DAILY. X 1 WEEK THEN TAKE 2 CAPSULES DAILY Patient taking differently: Take 60 mg by mouth daily. 12/24/19   Gardenia Phlegm, NP  fluticasone (FLONASE) 50 MCG/ACT nasal spray Place 2 sprays into both nostrils daily. 09/04/21   Henderly, Britni A, PA-C  hydrocortisone cream 1 % Apply to affected area 2 times daily 02/17/21   Couture, Cortni S, PA-C  ibuprofen (ADVIL) 600 MG tablet TAKE 1 TABLET (600 MG TOTAL) BY MOUTH EVERY 6 (SIX) HOURS AS NEEDED  FOR MILD PAIN 07/04/22   Nicholas Lose, MD  metroNIDAZOLE (FLAGYL) 500 MG tablet Take 1 tablet (500 mg total) by mouth 2 (two) times daily. 01/16/22   Ward, Lenise Arena, PA-C  omeprazole (PRILOSEC) 20 MG capsule Take 1 capsule (20 mg total) by mouth daily. 08/23/22   Montine Circle, PA-C  ondansetron (ZOFRAN-ODT) 4 MG disintegrating tablet Take 1 tablet (4 mg total) by mouth every 8 (eight) hours as needed for nausea or vomiting. 08/23/22   Montine Circle, PA-C  sucralfate (CARAFATE) 1 g tablet Take 1 tablet (1 g total) by mouth 4 (four) times daily -  with meals and at bedtime. 08/23/22   Montine Circle, PA-C  SYMBICORT 80-4.5 MCG/ACT inhaler Inhale 2 puffs into the lungs 3 (three) times daily. 11/14/19   [provider]      Allergies    Shellfish allergy, Tea, and Tomato    Review of Systems   Review of Systems  Respiratory:  Positive for chest tightness and shortness of breath.   Cardiovascular:  Positive for chest pain.    Physical Exam Updated Vital Signs BP 119/76 (BP Location: Left Arm)   Pulse 68   Temp 98.1 F (36.7 C)   Resp 20   LMP 08/17/2016 (Approximate)   SpO2 100%  Physical Exam Vitals and nursing note reviewed.  Constitutional:      Appearance: She is not ill-appearing or  toxic-appearing.  HENT:     Head: Normocephalic and atraumatic.     Mouth/Throat:     Mouth: Mucous membranes are moist.     Pharynx: No oropharyngeal exudate or posterior oropharyngeal erythema.  Eyes:     General:        Right eye: No discharge.        Left eye: No discharge.     Extraocular Movements: Extraocular movements intact.     Conjunctiva/sclera: Conjunctivae normal.     Pupils: Pupils are equal, round, and reactive to light.  Cardiovascular:     Rate and Rhythm: Normal rate and regular rhythm.     Pulses: Normal pulses.     Heart sounds: Normal heart sounds. No murmur heard. Pulmonary:     Effort: Pulmonary effort is normal. No respiratory distress.     Breath  sounds: Normal breath sounds. No wheezing or rales.  Chest:     Chest wall: Tenderness present. No mass, lacerations, deformity, swelling or crepitus.    Abdominal:     General: Bowel sounds are normal. There is no distension.     Palpations: Abdomen is soft.     Tenderness: There is no abdominal tenderness. There is no right CVA tenderness, left CVA tenderness, guarding or rebound.  Musculoskeletal:        General: No deformity.     Cervical back: Neck supple.     Right lower leg: No edema.     Left lower leg: No edema.  Skin:    General: Skin is warm and dry.     Capillary Refill: Capillary refill takes less than 2 seconds.     Findings: No rash.  Neurological:     General: No focal deficit present.     Mental Status: She is alert and oriented to person, place, and time. Mental status is at baseline.  Psychiatric:        Mood and Affect: Mood normal.     ED Results / Procedures / Treatments   Labs (all labs ordered are listed, but only abnormal results are displayed) Labs Reviewed  CBC WITH DIFFERENTIAL/PLATELET - Abnormal; Notable for the following components:      Result Value   nRBC 0.3 (*)    Lymphs Abs 4.5 (*)    All other components within normal limits  COMPREHENSIVE METABOLIC PANEL - Abnormal; Notable for the following components:   Calcium 8.8 (*)    Total Protein 6.4 (*)    All other components within normal limits  TROPONIN I (HIGH SENSITIVITY)  TROPONIN I (HIGH SENSITIVITY)    EKG None  Radiology DG Chest 2 View  Result Date: 10/22/2022 CLINICAL DATA:  Chest pain EXAM: CHEST - 2 VIEW COMPARISON:  08/22/2022 FINDINGS: The heart size and mediastinal contours are within normal limits. Both lungs are clear. The visualized skeletal structures are unremarkable. Prior right sided breast surgery is noted. IMPRESSION: No active cardiopulmonary disease. Electronically Signed   By: Inez Catalina M.D.   On: 10/22/2022 23:41    Procedures Procedures     Medications Ordered in ED Medications - No data to display  ED Course/ Medical Decision Making/ A&P                            Medical Decision Making 52 year old female who presents with concern for chest wall pain underneath the left breast x 1 week, intermittently.  Mild hypertensive on intake and vitals otherwise  normal.  Cardiopulmonary abdominal exams are benign.  Patient with chest wall tenderness palpation over the left lateral ribs, no evidence of trauma, no skin changes.  DDx includes not limited to ACS, PE, pleural effusion, pneumonia, pneumothorax, reactive airway disease, shingles, pleurisy, musculoskeletal injury.  CBC  Amount and/or Complexity of Data Reviewed Labs: ordered.    Details: CBCwithout leukocyte doses or anemia, CMP unremarkable, troponin negative x 2. Radiology: ordered.    Details: Chest x-ray visualized this provider is negative for acute cardiopulmonary disease.   ECG/medicine tests:     Details: EKG with normal sinus rhythm without STEMI.   Overall clinical picture reassuring.  Patient with out acute evidence of emergent cardiopulmonary etiology of her symptoms.  Clinical concern for emergent underlying etiology of her symptoms that would warrant further ED workup or inpatient management is exceedingly low. Recommend PCP follow-up, NSAIDs for musculoskeletal versus pleurisy pain.  Katherine [name] voiced understanding of her medical evaluation and treatment plan. Each of their questions answered to their expressed satisfaction.  Return precautions were given.  Patient is well-appearing, stable, and was discharged in good condition.   This chart was dictated using voice recognition software, Dragon. Despite the best efforts of this provider to proofread and correct errors, errors may still occur which can change documentation meaning.  Final Clinical Impression(s) / ED Diagnoses Final diagnoses:  None    Rx / DC Orders ED Discharge Orders     None          Aura Dials 10/23/22 0428    Fatima Blank, MD 10/23/22 [phone]

## 2022-10-23 NOTE — Discharge Instructions (Signed)
You were seen in the ER today for your chest pain and shortness of breath.  While the exact cause of your symptoms remains unclear, there does not appear to be any emergent problem with your heart or lungs on your workup today.  Please follow-up with your primary care doctor.  You should follow-up with the cardiology office to whom you have been referred.  Please call their office listed below if you have not heard from them in the next 72 hours.  Return to ER with any severe symptoms.

## 2022-10-23 NOTE — ED Notes (Signed)
Pt states developed sob while waiting in the lobby. Rates chest pain 9/10. Having provider reassess.

## 2022-11-16 ENCOUNTER — Other Ambulatory Visit: Payer: Self-pay

## 2022-11-16 ENCOUNTER — Emergency Department (HOSPITAL_COMMUNITY)
Admission: EM | Admit: 2022-11-16 | Discharge: 2022-11-16 | Disposition: A | Discharge status: Home / Self Care | Payer: Medicaid Other | Attending: Emergency Medicine | Admitting: Emergency Medicine

## 2022-11-16 DIAGNOSIS — J45909 Unspecified asthma, uncomplicated: Secondary | ICD-10-CM | POA: Insufficient documentation

## 2022-11-16 DIAGNOSIS — R112 Nausea with vomiting, unspecified: Secondary | ICD-10-CM | POA: Diagnosis not present

## 2022-11-16 DIAGNOSIS — Z7951 Long term (current) use of inhaled steroids: Secondary | ICD-10-CM | POA: Diagnosis not present

## 2022-11-16 DIAGNOSIS — Z1152 Encounter for screening for COVID-19: Secondary | ICD-10-CM | POA: Insufficient documentation

## 2022-11-16 DIAGNOSIS — R519 Headache, unspecified: Secondary | ICD-10-CM | POA: Diagnosis present

## 2022-11-16 DIAGNOSIS — R197 Diarrhea, unspecified: Secondary | ICD-10-CM | POA: Insufficient documentation

## 2022-11-16 LAB — CBC
HCT: 40.4 % (ref 36.0–46.0)
Hemoglobin: 13.3 g/dL (ref 12.0–15.0)
MCH: 27.5 pg (ref 26.0–34.0)
MCHC: 32.9 g/dL (ref 30.0–36.0)
MCV: 83.6 fL (ref 80.0–100.0)
Platelets: 250 10*3/uL (ref 150–400)
RBC: 4.83 MIL/uL (ref 3.87–5.11)
RDW: 14.6 % (ref 11.5–15.5)
WBC: 6 10*3/uL (ref 4.0–10.5)
nRBC: 0 % (ref 0.0–0.2)

## 2022-11-16 LAB — COMPREHENSIVE METABOLIC PANEL
ALT: 15 U/L (ref 0–44)
AST: 20 U/L (ref 15–41)
Albumin: 4.4 g/dL (ref 3.5–5.0)
Alkaline Phosphatase: 67 U/L (ref 38–126)
Anion gap: 8 (ref 5–15)
BUN: 9 mg/dL (ref 6–20)
CO2: 24 mmol/L (ref 22–32)
Calcium: 9.2 mg/dL (ref 8.9–10.3)
Chloride: 105 mmol/L (ref 98–111)
Creatinine, Ser: 0.56 mg/dL (ref 0.44–1.00)
GFR, Estimated: >60 mL/min (ref >60)
Glucose, Bld: 124 mg/dL — ABNORMAL HIGH (ref 70–99)
Potassium: 3.5 mmol/L (ref 3.5–5.1)
Sodium: 137 mmol/L (ref 135–145)
Total Bilirubin: 0.8 mg/dL (ref 0.3–1.2)
Total Protein: 7.6 g/dL (ref 6.5–8.1)

## 2022-11-16 LAB — URINALYSIS, ROUTINE W REFLEX MICROSCOPIC
Bilirubin Urine: NEGATIVE
Glucose, UA: NEGATIVE mg/dL
Hgb urine dipstick: NEGATIVE
Ketones, ur: 20 mg/dL — AB
Leukocytes,Ua: NEGATIVE
Nitrite: NEGATIVE
Protein, ur: 30 mg/dL — AB
Specific Gravity, Urine: 1.026 (ref 1.005–1.030)
pH: 6 (ref 5.0–8.0)

## 2022-11-16 LAB — LIPASE, BLOOD: Lipase: 29 U/L (ref 11–51)

## 2022-11-16 LAB — RESP PANEL BY RT-PCR (RSV, FLU A&B, COVID)  RVPGX2
Influenza A by PCR: NEGATIVE
Influenza B by PCR: NEGATIVE
Resp Syncytial Virus by PCR: NEGATIVE
SARS Coronavirus 2 by RT PCR: NEGATIVE

## 2022-11-16 MED ORDER — LACTATED RINGERS IV BOLUS
1000.0000 mL | Freq: Once | INTRAVENOUS | Status: AC
  Administered 2022-11-16: 1000 mL via INTRAVENOUS

## 2022-11-16 MED ORDER — PROCHLORPERAZINE EDISYLATE 10 MG/2ML IJ SOLN
10.0000 mg | Freq: Once | INTRAMUSCULAR | Status: AC
  Administered 2022-11-16: 10 mg via INTRAVENOUS
  Filled 2022-11-16: qty 2

## 2022-11-16 MED ORDER — ONDANSETRON 4 MG PO TBDP
4.0000 mg | ORAL_TABLET | Freq: Once | ORAL | Status: AC
  Administered 2022-11-16: 4 mg via ORAL
  Filled 2022-11-16: qty 1

## 2022-11-16 MED ORDER — DICYCLOMINE HCL 20 MG PO TABS: 20.0000 mg | ORAL_TABLET | Freq: Two times a day (BID) | ORAL | 0 refills | Status: DC

## 2022-11-16 MED ORDER — PANTOPRAZOLE SODIUM 40 MG PO TBEC
40.0000 mg | DELAYED_RELEASE_TABLET | Freq: Every day | ORAL | Status: DC
  Administered 2022-11-16: 40 mg via ORAL
  Filled 2022-11-16: qty 1

## 2022-11-16 MED ORDER — HYDROCODONE-ACETAMINOPHEN 5-325 MG PO TABS
1.0000 | ORAL_TABLET | Freq: Once | ORAL | Status: AC
  Administered 2022-11-16: 1 via ORAL
  Filled 2022-11-16: qty 1

## 2022-11-16 MED ORDER — KETOROLAC TROMETHAMINE 15 MG/ML IJ SOLN
15.0000 mg | Freq: Once | INTRAMUSCULAR | Status: AC
  Administered 2022-11-16: 15 mg via INTRAVENOUS
  Filled 2022-11-16: qty 1

## 2022-11-16 MED ORDER — ONDANSETRON 4 MG PO TBDP: 8.0000 mg | ORAL_TABLET | Freq: Once | ORAL | Status: DC

## 2022-11-16 MED ORDER — ONDANSETRON HCL 4 MG PO TABS: 4.0000 mg | ORAL_TABLET | Freq: Four times a day (QID) | ORAL | 0 refills | Status: DC

## 2022-11-16 NOTE — ED Provider Notes (Signed)
[facility] Provider Note   CSN: [id number] Arrival date & time: 11/16/22  [id number]     History Chief Complaint  Patient presents with   Emesis   Nausea   Diarrhea   Abdominal Pain    Katherine [name] is a 52 y.o. female with h/o asthtma and acid reflux presents the emergency room today for evaluation of nausea, vomiting, diarrhea with some generalized abdominal discomfort since last night.  Patient reports that she went out to eat with her daughter last night and had a chicken sandwich and strawberry cake and a few hours later started having some nonbloody, nonbilious emesis.  She reports that she has had around 20 episodes in the past 24 hours.  She has had 10 episodes of brown watery diarrhea that is not melanotic or bloody. Additionally, she reports that she felt like she was getting a headache and is prone to them. This feels like her regular headaches. No visual changes, but does report she has some photophobia. She has not tried any medications at home for this.  She denies any chest pain, shortness of breath, dysuria, hematuria, fevers, flulike symptoms, vaginal discharge, vaginal bleeding, recent travel, recent antibiotics.  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  She does not know of any other known sick contacts.  She reports that she has not thrown up since she was given Zofran in triage.   Emesis Associated symptoms: abdominal pain, diarrhea and headaches   Associated symptoms: no chills, no cough, no fever and no sore throat   Diarrhea Associated symptoms: abdominal pain, headaches and vomiting   Associated symptoms: no chills and no fever   Abdominal Pain Associated symptoms: diarrhea, nausea and vomiting   Associated symptoms: no chest pain, no chills, no cough, no dysuria, no fever, no hematuria, no shortness of breath, no sore throat, no vaginal bleeding and no vaginal discharge        Home Medications Prior to Admission  medications   Medication Sig Start Date End Date Taking? Authorizing Provider  albuterol (PROVENTIL HFA;VENTOLIN HFA) 108 (90 Base) MCG/ACT inhaler Inhale 2 puffs into the lungs every 6 (six) hours as needed for wheezing or shortness of breath.    [provider]  cetirizine (ZYRTEC) 10 MG tablet Take 10 mg by mouth daily. 10/06/22   [provider]  DULoxetine (CYMBALTA) 30 MG capsule TAKE 1 CAPSULE (30 MG TOTAL) BY MOUTH DAILY. X 1 WEEK THEN TAKE 2 CAPSULES DAILY Patient taking differently: Take 60 mg by mouth daily. 12/24/19   Gardenia Phlegm, NP  fluticasone (FLONASE) 50 MCG/ACT nasal spray Place 2 sprays into both nostrils daily. 09/04/21   Henderly, Britni A, PA-C  hydrocortisone cream 1 % Apply to affected area 2 times daily 02/17/21   Couture, Cortni S, PA-C  ibuprofen (ADVIL) 600 MG tablet TAKE 1 TABLET (600 MG TOTAL) BY MOUTH EVERY 6 (SIX) HOURS AS NEEDED FOR MILD PAIN 07/04/22   Nicholas Lose, MD  metoprolol succinate (TOPROL-XL) 25 MG 24 hr tablet Take 25 mg by mouth daily. 11/10/22   [provider]  metroNIDAZOLE (FLAGYL) 500 MG tablet Take 1 tablet (500 mg total) by mouth 2 (two) times daily. 01/16/22   Ward, Lenise Arena, PA-C  omeprazole (PRILOSEC) 20 MG capsule Take 1 capsule (20 mg total) by mouth daily. 08/23/22   Montine Circle, PA-C  omeprazole (PRILOSEC) 40 MG capsule Take 40 mg by mouth every morning. 11/10/22   [provider]  ondansetron (ZOFRAN-ODT) 4 MG disintegrating tablet Take 1 tablet (4 mg total) by mouth every 8 (eight) hours as needed for nausea or vomiting. 08/23/22   Montine Circle, PA-C  RESTASIS 0.05 % ophthalmic emulsion Place 1 drop into both eyes 2 (two) times daily. 08/16/22   [provider]  sucralfate (CARAFATE) 1 g tablet Take 1 tablet (1 g total) by mouth 4 (four) times daily -  with meals and at bedtime. 08/23/22   Montine Circle, PA-C  SYMBICORT 80-4.5 MCG/ACT inhaler Inhale 2 puffs into the lungs 3  (three) times daily. 11/14/19   [provider]      Allergies    Shellfish allergy, Tea, and Tomato    Review of Systems   Review of Systems  Constitutional:  Negative for chills and fever.  HENT:  Negative for congestion, rhinorrhea and sore throat.   Respiratory:  Negative for cough and shortness of breath.   Cardiovascular:  Negative for chest pain.  Gastrointestinal:  Positive for abdominal pain, diarrhea, nausea and vomiting. Negative for anal bleeding and blood in stool.  Genitourinary:  Negative for dysuria, frequency, hematuria, urgency, vaginal bleeding and vaginal discharge.  Neurological:  Positive for headaches.    Physical Exam Updated Vital Signs BP 106/76 (BP Location: Right Arm)   Pulse 70   Temp 97.7 F (36.5 C)   Resp 16   Ht 5' 5.5" (1.664 m)   Wt 63 kg   LMP 08/17/2016 (Approximate)   SpO2 99%   BMI 22.78 kg/m   Physical Exam Vitals and nursing note reviewed.  Constitutional:      General: She is not in acute distress.    Appearance: She is not ill-appearing or toxic-appearing.  HENT:     Mouth/Throat:     Mouth: Mucous membranes are dry.     Pharynx: Oropharynx is clear.     Comments: Poor dentition Eyes:     General: No scleral icterus. Cardiovascular:     Rate and Rhythm: Normal rate.  Pulmonary:     Effort: Pulmonary effort is normal. No respiratory distress.  Abdominal:     General: Abdomen is flat. Bowel sounds are normal. There is no distension.     Palpations: Abdomen is soft.     Tenderness: There is no abdominal tenderness. There is no right CVA tenderness, left CVA tenderness, guarding or rebound.     Comments: Abdomen is soft and nontender.  Normal active bowel sounds.  No CVA tenderness bilaterally.  Skin:    General: Skin is warm and dry.     Capillary Refill: Capillary refill takes less than 2 seconds.  Neurological:     General: No focal deficit present.     Mental Status: She is alert.     GCS: GCS eye subscore is  4. GCS verbal subscore is 5. GCS motor subscore is 6.     Cranial Nerves: No cranial nerve deficit, dysarthria or facial asymmetry.     Sensory: No sensory deficit.     Motor: No weakness or pronator drift.     Coordination: Finger-Nose-Finger Test normal.     Comments: GCS 15.  No facial asymmetry noted.  Patient is answering questions appropriately with appropriate speech. Cranial nerves II-XII intact. Sensation intact throughout.  Strength is intact in patient's bilateral upper and lower extremities.  No pronator drift.  Normal finger-nose-finger with both upper extremities.      ED Results / Procedures / Treatments   Labs (all labs ordered  are listed, but only abnormal results are displayed) Labs Reviewed  COMPREHENSIVE METABOLIC PANEL - Abnormal; Notable for the following components:      Result Value   Glucose, Bld 124 (*)    All other components within normal limits  URINALYSIS, ROUTINE W REFLEX MICROSCOPIC - Abnormal; Notable for the following components:   APPearance HAZY (*)    Ketones, ur 20 (*)    Protein, ur 30 (*)    Bacteria, UA RARE (*)    All other components within normal limits  RESP PANEL BY RT-PCR (RSV, FLU A&B, COVID)  RVPGX2  LIPASE, BLOOD  CBC    EKG None  Radiology No results found.  Procedures Procedures    Medications Ordered in ED Medications  ondansetron (ZOFRAN-ODT) disintegrating tablet 4 mg (4 mg Oral Given 11/16/22 1018)  HYDROcodone-acetaminophen (NORCO/VICODIN) 5-325 MG per tablet 1 tablet (1 tablet Oral Given 11/16/22 1416)    ED Course/ Medical Decision Making/ A&P                            Medical Decision Making Amount and/or Complexity of Data Reviewed Labs: ordered.  Risk Prescription drug management.   52 year old female presents the emergency room today for evaluation of nausea, vomiting, diarrhea, and diffuse abdominal pain.  Differential diagnosis includes but is not limited to viral illness, gastroenteritis,  diverticulitis, colitis, intra-abdominal abscess, C. difficile, cannabinoid induced hyperemesis syndrome, small bowel obstruction, dehydration, electrolyte abnormality.  Vital signs are unremarkable.  Patient normotensive, afebrile, no pulse rate, satting well on room air without increased work of breathing.  Physical exam as noted above.  Labs ordered in triage.  I independently reviewed and interpreted the patient's labs.  CMP shows mildly increased glucose at 124 otherwise no electrolyte or LFT abnormality.  CBC without leukocytosis or anemia.  Lipase within normal limits.  Negative for COVID, flu, RSV.  Urinalysis does show hazy urine with ketones and protein present.  There is rare bacteria otherwise no white blood cells, red blood cells, nitrites, or leukocytes.  Not consistent with any infection.  I do not think any imaging of the head or abdomen is needed.  Patient reports this feels very typical headache, she has no focal neurodeficit, or does she complain about any focal neurodeficit.  I do not think any imaging is needed of her belly.  Her abdomen is soft and nontender.  She has normal active bowel sounds.  She has not had any melanotic or bloody bowel movements.  She has not had any diarrhea since being here in the emergency department. This is likely viral gastroenteritis.   Patient was given Zofran and Vicodin in triage and has not had any emesis since.  She was given 1 L LR, Protonix, Compazine, and Toradol for HA and dehydration.   On re-evaluation, the patient reports that she is feeling much better.  She reports that her headache and abdominal pain have resolved.  She is still not had any emesis or diarrhea since being here.  Vital signs are still within normal limits.  She reports that she is feeling better like to go home however she would like a work note.  I will send her home with some Zofran and some Bentyl.    We discussed return precautions and red flag symptoms in the meantime.   Discussed supportive care measures and staying well-hydrated.  Patient verbalizes her understanding and agrees to the plan.  Patient is stable and being  discharged home in good condition.  Final Clinical Impression(s) / ED Diagnoses Final diagnoses:  Acute nonintractable headache, unspecified headache type  Nausea vomiting and diarrhea    Rx / DC Orders ED Discharge Orders          Ordered    ondansetron (ZOFRAN) 4 MG tablet  Every 6 hours        11/16/22 1825    dicyclomine (BENTYL) 20 MG tablet  2 times daily        11/16/22 1825              Sherrell Puller, [place] 11/16/22 1829    Wyvonnia Dusky, MD 11/16/22 2041

## 2022-11-16 NOTE — ED Provider Triage Note (Signed)
Emergency Medicine Provider Triage Evaluation Note  Katherine [name] , a 52 y.o. female  was evaluated in triage.  Pt complains of abdominal pain, nausea, vomiting, headache all since yesterday while she was at work.  She works as a Presenter, broadcasting at [employer].  Review of Systems  Positive:  Negative:   Physical Exam  BP 110/84 (BP Location: Right Arm)   Pulse 82   Temp (!) 97.4 F (36.3 C) (Axillary)   Resp 20   Ht 5' 5.5" (1.664 m)   Wt 63 kg   LMP 08/17/2016 (Approximate)   SpO2 100%   BMI 22.78 kg/m  Gen:   Awake, no distress   Resp:  Normal effort  MSK:   Moves extremities without difficulty  Other:  Uncomfortable appearing, actively vomiting in triage  Medical Decision Making  Medically screening exam initiated at 10:27 AM.  Appropriate orders placed.  [name] was informed that the remainder of the evaluation will be completed by another provider, this initial triage assessment does not replace that evaluation, and the importance of remaining in the ED until their evaluation is complete.     Rhae Hammock, PA-C 11/16/22 1027

## 2022-11-16 NOTE — Discharge Instructions (Addendum)
You were seen in the ER for evaluation of your nausea, vomiting, and diarrhea.  Your labs showed you are a little dehydrated otherwise were unremarkable.  I am glad that you are feeling better.  Please remember to drink plenty of fluids, mainly water to stay well-hydrated.  Additionally, I would try bland diet for the next few days.  I did send you home with 2 medications. One is called Zofran which is an antinausea medication.  He can take this as needed.  The other is a medication called Bentyl which is the antispasmodic we were talking about.  This can help with those abdominal cramps before diarrhea.  If you have any concerns, new or worsening symptoms, please return to the nearest emergency department for reevaluation.  Contact a doctor if: Your symptoms get worse. You have new symptoms. You have a fever. You cannot drink fluids without vomiting. You feel like you may vomit for more than 2 days. You feel light-headed or dizzy. You have a headache. You have muscle cramps. You have a rash. You have pain while peeing. Get help right away if: You have pain in your chest, neck, arm, or jaw. You feel very weak or you faint. You vomit again and again. You have vomit that is bright red or looks like black coffee grounds. You have bloody or black poop (stools) or poop that looks like tar. You have a very bad headache, a stiff neck, or both. You have very bad pain, cramping, or bloating in your belly (abdomen). You have trouble breathing. You are breathing very quickly. Your heart is beating very quickly. Your skin feels cold and clammy. You feel confused. You have signs of losing too much water in your body, such as: Dark pee, very little pee, or no pee. Cracked lips. Dry mouth. Sunken eyes. Sleepiness. Weakness. These symptoms may be an emergency. Get help right away. Call 911. Do not wait to see if the symptoms will go away. Do not drive yourself to the hospital.

## 2022-11-16 NOTE — ED Triage Notes (Signed)
Pt. Stated, Ive had N/V/D since yesterday last night while at work.

## 2022-12-31 NOTE — Progress Notes (Deleted)
Cardiology Office Note   Date:  12/31/2022   ID:  Katherine [name], DOB [birth year], MRN [id number]  PCP:  Verlon Au, MD  Cardiologist:   Dietrich Pates, MD       History of Present Illness: Katherine [name] is a 52 y.o. female with a history of   I saw the pt in 2017   She has been seen by EP since      No outpatient medications have been marked as taking for the 01/04/23 encounter (Appointment) with Pricilla Riffle, MD.     Allergies:   Shellfish allergy, Tea, and Tomato   Past Medical History:  Diagnosis Date   Anxiety    Asthma    Dysrhythmia    History of SVT-No medications   Fibromyalgia    GERD (gastroesophageal reflux disease)    History of radiation therapy 12/19/17- 01/15/18   Right Breast, 2.67 Gy in 15 fractions for a total dose of 40.05 Gy. 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   right breast   Migraine    Palpitations    Pneumonia    with cavitation of left lower lobe   SVT (supraventricular tachycardia)    Zio Monitor 11/2019: Patient only wore for 3 day (adhesive allergy); NSR, Avg HR 65, no arrhythmia   Tuberculosis    exposure as a child, get checked frequently   Uterine fibroid     Past Surgical History:  Procedure Laterality Date   ABDOMINAL HYSTERECTOMY     BREAST LUMPECTOMY Right    BREAST LUMPECTOMY WITH RADIOACTIVE SEED AND SENTINEL LYMPH NODE BIOPSY Right 07/24/2017   Procedure: RIGHT BREAST LUMPECTOMY WITH RADIOACTIVE SEED AND SENTINEL LYMPH NODE BIOPSY;  Surgeon: Harriette Bouillon, MD;  Location: [facility];  Service: General;  Laterality: Right;   PORT-A-CATH REMOVAL Right 06/18/2019   Procedure: REMOVAL PORT-A-CATH;  Surgeon: Harriette Bouillon, MD;  Location: [facility];  Service: General;  Laterality: Right;   PORTACATH PLACEMENT Right 07/24/2017   Procedure: INSERTION PORT-A-CATH;  Surgeon: Harriette Bouillon, MD;  Location:  SURGERY CENTER;  Service: General;  Laterality: Right;   TUBAL LIGATION  1993   VAGINAL HYSTERECTOMY Bilateral 12/19/2016   Procedure: HYSTERECTOMY VAGINAL;  Surgeon: Willodean Rosenthal, MD;  Location: [facility];  Service: Gynecology;  Laterality: Bilateral;   WISDOM TOOTH EXTRACTION       Social History:  The patient  reports that she quit smoking about 5 years ago. Her smoking use included cigarettes. She smoked an average of .5 packs per day. She has never used smokeless tobacco. She reports that she does not currently use drugs after having used the following drugs: Marijuana. She reports that she does not drink alcohol.   Family History:  The patient's family history includes Heart disease in her father and mother; Hypertension in her father, mother, and another family member; Migraines in her daughter and son.    ROS:  Please see the history of present illness. All other systems are reviewed and  Negative to the above problem except as noted.    PHYSICAL EXAM: VS:  LMP 08/17/2016 (Approximate)   GEN: Well nourished, well developed, in no acute distress  HEENT: normal  Neck: no JVD, carotid bruits, or masses Cardiac: RRR; no murmurs, rubs, or gallops,no edema  Respiratory:  clear to auscultation bilaterally, normal  work of breathing GI: soft, nontender, nondistended, + BS  No hepatomegaly  MS: no deformity Moving all extremities   Skin: warm and dry, no rash Neuro:  Strength and sensation are intact Psych: euthymic mood, full affect   EKG:  EKG is ordered today.   Lipid Panel    Component Value Date/Time   CHOL 145 09/05/2013 1459   TRIG 76 09/05/2013 1459   HDL 46 09/05/2013 1459   CHOLHDL 3.2 09/05/2013 1459   VLDL 15 09/05/2013 1459   LDLCALC 84 09/05/2013 1459      Wt Readings from Last 3 Encounters:  11/16/22 138 lb 14.2 oz (63 kg)  07/25/22 132 lb 6.4 oz (60.1 kg)  10/13/21 142 lb 11.2 oz (64.7 kg)      ASSESSMENT AND  PLAN:     Current medicines are reviewed at length with the patient today.  The patient does not have concerns regarding medicines.  Signed, Dietrich Pates, MD  12/31/2022 8:59 AM    [facility] [address] [place], [place], [place]  [address] Phone: [phone]; Fax: [phone]

## 2023-01-03 ENCOUNTER — Other Ambulatory Visit: Payer: Self-pay | Admitting: Physician Assistant

## 2023-01-03 DIAGNOSIS — Z853 Personal history of malignant neoplasm of breast: Secondary | ICD-10-CM

## 2023-01-04 ENCOUNTER — Ambulatory Visit: Payer: Medicaid Other | Attending: Internal Medicine | Admitting: Internal Medicine

## 2023-01-09 ENCOUNTER — Encounter: Payer: Self-pay | Admitting: Internal Medicine

## 2023-02-22 ENCOUNTER — Other Ambulatory Visit: Payer: Self-pay | Admitting: Hematology and Oncology

## 2023-02-22 ENCOUNTER — Ambulatory Visit: Payer: Medicaid Other

## 2023-02-22 ENCOUNTER — Ambulatory Visit
Admission: RE | Admit: 2023-02-22 | Discharge: 2023-02-22 | Disposition: A | Discharge status: Home / Self Care | Payer: Medicaid Other | Source: Ambulatory Visit | Attending: Physician Assistant | Admitting: Physician Assistant

## 2023-02-22 DIAGNOSIS — Z853 Personal history of malignant neoplasm of breast: Secondary | ICD-10-CM

## 2023-02-22 DIAGNOSIS — N63 Unspecified lump in unspecified breast: Secondary | ICD-10-CM

## 2023-02-22 DIAGNOSIS — N644 Mastodynia: Secondary | ICD-10-CM

## 2023-02-22 HISTORY — DX: Malignant neoplasm of unspecified site of unspecified female breast: C50.919

## 2023-02-23 ENCOUNTER — Ambulatory Visit
Admission: RE | Admit: 2023-02-23 | Discharge: 2023-02-23 | Disposition: A | Discharge status: Home / Self Care | Payer: Medicaid Other | Source: Ambulatory Visit | Attending: Hematology and Oncology | Admitting: Hematology and Oncology

## 2023-02-23 ENCOUNTER — Other Ambulatory Visit: Payer: Self-pay | Admitting: Hematology and Oncology

## 2023-02-23 DIAGNOSIS — N644 Mastodynia: Secondary | ICD-10-CM

## 2023-02-23 DIAGNOSIS — N63 Unspecified lump in unspecified breast: Secondary | ICD-10-CM

## 2023-03-07 ENCOUNTER — Ambulatory Visit
Admission: RE | Admit: 2023-03-07 | Discharge: 2023-03-07 | Disposition: A | Discharge status: Home / Self Care | Payer: Medicaid Other | Source: Ambulatory Visit | Attending: Hematology and Oncology | Admitting: Hematology and Oncology

## 2023-03-07 ENCOUNTER — Other Ambulatory Visit: Payer: Self-pay | Admitting: Hematology and Oncology

## 2023-03-07 DIAGNOSIS — N644 Mastodynia: Secondary | ICD-10-CM

## 2023-03-07 DIAGNOSIS — N63 Unspecified lump in unspecified breast: Secondary | ICD-10-CM

## 2023-04-03 ENCOUNTER — Inpatient Hospital Stay: Admission: RE | Admit: 2023-04-03 | Payer: Medicaid Other | Source: Ambulatory Visit

## 2023-04-19 ENCOUNTER — Ambulatory Visit: Payer: Medicaid Other | Admitting: Obstetrics and Gynecology

## 2023-05-01 ENCOUNTER — Ambulatory Visit: Payer: Medicaid Other

## 2023-05-01 ENCOUNTER — Ambulatory Visit
Admission: RE | Admit: 2023-05-01 | Discharge: 2023-05-01 | Disposition: A | Discharge status: Home / Self Care | Payer: Medicaid Other | Source: Ambulatory Visit | Attending: Hematology and Oncology | Admitting: Hematology and Oncology

## 2023-05-01 DIAGNOSIS — N644 Mastodynia: Secondary | ICD-10-CM

## 2023-05-01 DIAGNOSIS — N63 Unspecified lump in unspecified breast: Secondary | ICD-10-CM

## 2023-05-11 ENCOUNTER — Encounter (HOSPITAL_COMMUNITY): Payer: Self-pay

## 2023-05-11 ENCOUNTER — Ambulatory Visit (HOSPITAL_COMMUNITY)
Admission: EM | Admit: 2023-05-11 | Discharge: 2023-05-11 | Disposition: A | Discharge status: Home / Self Care | Payer: Medicaid Other | Attending: Emergency Medicine | Admitting: Emergency Medicine

## 2023-05-11 ENCOUNTER — Emergency Department (HOSPITAL_COMMUNITY): Payer: Medicaid Other

## 2023-05-11 ENCOUNTER — Other Ambulatory Visit: Payer: Self-pay

## 2023-05-11 ENCOUNTER — Emergency Department (HOSPITAL_COMMUNITY)
Admission: EM | Admit: 2023-05-11 | Discharge: 2023-05-11 | Disposition: A | Discharge status: Home / Self Care | Payer: Medicaid Other | Attending: Emergency Medicine | Admitting: Emergency Medicine

## 2023-05-11 DIAGNOSIS — R0602 Shortness of breath: Secondary | ICD-10-CM | POA: Diagnosis present

## 2023-05-11 DIAGNOSIS — K219 Gastro-esophageal reflux disease without esophagitis: Secondary | ICD-10-CM | POA: Diagnosis not present

## 2023-05-11 DIAGNOSIS — Z7951 Long term (current) use of inhaled steroids: Secondary | ICD-10-CM | POA: Insufficient documentation

## 2023-05-11 DIAGNOSIS — R079 Chest pain, unspecified: Secondary | ICD-10-CM

## 2023-05-11 DIAGNOSIS — I10 Essential (primary) hypertension: Secondary | ICD-10-CM | POA: Diagnosis not present

## 2023-05-11 DIAGNOSIS — Z79899 Other long term (current) drug therapy: Secondary | ICD-10-CM | POA: Insufficient documentation

## 2023-05-11 DIAGNOSIS — J4520 Mild intermittent asthma, uncomplicated: Secondary | ICD-10-CM | POA: Diagnosis not present

## 2023-05-11 LAB — CBC
HCT: 39.3 % (ref 36.0–46.0)
Hemoglobin: 13 g/dL (ref 12.0–15.0)
MCH: 28 pg (ref 26.0–34.0)
MCHC: 33.1 g/dL (ref 30.0–36.0)
MCV: 84.7 fL (ref 80.0–100.0)
Platelets: 210 10*3/uL (ref 150–400)
RBC: 4.64 MIL/uL (ref 3.87–5.11)
RDW: 14.9 % (ref 11.5–15.5)
WBC: 6.9 10*3/uL (ref 4.0–10.5)
nRBC: 0 % (ref 0.0–0.2)

## 2023-05-11 LAB — BASIC METABOLIC PANEL
Anion gap: 13 (ref 5–15)
BUN: 13 mg/dL (ref 6–20)
CO2: 23 mmol/L (ref 22–32)
Calcium: 9 mg/dL (ref 8.9–10.3)
Chloride: 102 mmol/L (ref 98–111)
Creatinine, Ser: 0.54 mg/dL (ref 0.44–1.00)
GFR, Estimated: >60 mL/min (ref >60)
Glucose, Bld: 85 mg/dL (ref 70–99)
Potassium: 3.9 mmol/L (ref 3.5–5.1)
Sodium: 138 mmol/L (ref 135–145)

## 2023-05-11 LAB — TROPONIN I (HIGH SENSITIVITY)
Troponin I (High Sensitivity): <2 ng/L (ref <18)
Troponin I (High Sensitivity): 2 ng/L (ref <18)

## 2023-05-11 MED ORDER — FAMOTIDINE IN NACL 20-0.9 MG/50ML-% IV SOLN: 20.0000 mg | INTRAVENOUS | Status: DC

## 2023-05-11 MED ORDER — ALUM & MAG HYDROXIDE-SIMETH 200-200-20 MG/5ML PO SUSP
15.0000 mL | Freq: Once | ORAL | Status: AC
  Administered 2023-05-11: 15 mL via ORAL
  Filled 2023-05-11: qty 30

## 2023-05-11 MED ORDER — ACETAMINOPHEN 500 MG PO TABS
1000.0000 mg | ORAL_TABLET | Freq: Once | ORAL | Status: AC
  Administered 2023-05-11: 1000 mg via ORAL
  Filled 2023-05-11: qty 2

## 2023-05-11 MED ORDER — ASPIRIN 81 MG PO CHEW
324.0000 mg | CHEWABLE_TABLET | Freq: Once | ORAL | Status: AC
  Administered 2023-05-11: 324 mg via ORAL

## 2023-05-11 MED ORDER — ASPIRIN 81 MG PO CHEW
CHEWABLE_TABLET | ORAL | Status: AC
  Filled 2023-05-11: qty 4

## 2023-05-11 MED ORDER — IPRATROPIUM-ALBUTEROL 0.5-2.5 (3) MG/3ML IN SOLN
3.0000 mL | Freq: Once | RESPIRATORY_TRACT | Status: AC
  Administered 2023-05-11: 3 mL via RESPIRATORY_TRACT
  Filled 2023-05-11: qty 3

## 2023-05-11 MED ORDER — FAMOTIDINE 20 MG PO TABS
20.0000 mg | ORAL_TABLET | Freq: Once | ORAL | Status: AC
  Administered 2023-05-11: 20 mg via ORAL
  Filled 2023-05-11: qty 1

## 2023-05-11 NOTE — ED Notes (Signed)
Patient is being discharged from the Urgent Care and sent to the Emergency Department via private vehicle . Per Dr Chaney Malling, patient is in need of higher level of care due to chest pain. Patient is aware and verbalizes understanding of plan of care.  Vitals:   05/11/23 1045  BP: (!) 123/101  Pulse: (!) 52  Resp: 16  Temp: 98.3 F (36.8 C)  SpO2: 100%

## 2023-05-11 NOTE — ED Provider Notes (Signed)
[facility] EMERGENCY DEPARTMENT AT [facility] Provider Note   CSN: [id number] Arrival date & time: 05/11/23  1124     History  Chief Complaint  Patient presents with   Chest Pain   Shortness of Breath   Hypertension    Katherine [name] is a 52 y.o. female with PMHx anxiety, asthma, GERD, fibromyalgia, palpitations who presents to ED concerned for chest pain x4 days. Patient also complaining of diffuse pain in legs and shoulders and also states that her asthma started worsening today and she now has dry cough. Patient went to UC thinking that her pain was d/t acid reflux and asthma - UC referred patient to ED for further workup. Patient ambulating without difficulties.  Denies fever, nausea, vomiting, diarrhea. No past hx of heart conditions. Denies recent surgery/immobilization, hx DT/PE, hemoptysis, hx cancer in the past 6 months, calf swelling/tenderness. Denies pain radiating to the back.   Chest Pain Associated symptoms: shortness of breath   Shortness of Breath Associated symptoms: chest pain   Hypertension Associated symptoms include chest pain and shortness of breath.       Home Medications Prior to Admission medications   Medication Sig Start Date End Date Taking? Authorizing Provider  albuterol (PROVENTIL HFA;VENTOLIN HFA) 108 (90 Base) MCG/ACT inhaler Inhale 2 puffs into the lungs every 6 (six) hours as needed for wheezing or shortness of breath.    [provider]  cetirizine (ZYRTEC) 10 MG tablet Take 10 mg by mouth daily. 10/06/22   [provider]  dicyclomine (BENTYL) 20 MG tablet Take 1 tablet (20 mg total) by mouth 2 (two) times daily. 11/16/22   Achille Rich, PA-C  DULoxetine (CYMBALTA) 30 MG capsule TAKE 1 CAPSULE (30 MG TOTAL) BY MOUTH DAILY. X 1 WEEK THEN TAKE 2 CAPSULES DAILY Patient taking differently: Take 60 mg by mouth daily. 12/24/19   Loa Socks, NP  fluticasone (FLONASE) 50 MCG/ACT nasal spray Place 2 sprays into  both nostrils daily. 09/04/21   Henderly, Britni A, PA-C  hydrocortisone cream 1 % Apply to affected area 2 times daily 02/17/21   Couture, Cortni S, PA-C  ibuprofen (ADVIL) 600 MG tablet TAKE 1 TABLET (600 MG TOTAL) BY MOUTH EVERY 6 (SIX) HOURS AS NEEDED FOR MILD PAIN 07/04/22   Serena Croissant, MD  metoprolol succinate (TOPROL-XL) 25 MG 24 hr tablet Take 25 mg by mouth daily. 11/10/22   [provider]  metroNIDAZOLE (FLAGYL) 500 MG tablet Take 1 tablet (500 mg total) by mouth 2 (two) times daily. 01/16/22   Ward, Tylene Fantasia, PA-C  omeprazole (PRILOSEC) 20 MG capsule Take 1 capsule (20 mg total) by mouth daily. 08/23/22   Roxy Horseman, PA-C  omeprazole (PRILOSEC) 40 MG capsule Take 40 mg by mouth every morning. 11/10/22   [provider]  ondansetron (ZOFRAN) 4 MG tablet Take 1 tablet (4 mg total) by mouth every 6 (six) hours. 11/16/22   Achille Rich, PA-C  ondansetron (ZOFRAN-ODT) 4 MG disintegrating tablet Take 1 tablet (4 mg total) by mouth every 8 (eight) hours as needed for nausea or vomiting. 08/23/22   Roxy Horseman, PA-C  RESTASIS 0.05 % ophthalmic emulsion Place 1 drop into both eyes 2 (two) times daily. 08/16/22   [provider]  sucralfate (CARAFATE) 1 g tablet Take 1 tablet (1 g total) by mouth 4 (four) times daily -  with meals and at bedtime. 08/23/22   Roxy Horseman, PA-C  SYMBICORT 80-4.5 MCG/ACT inhaler Inhale 2 puffs into the  lungs 3 (three) times daily. 11/14/19   [provider]      Allergies    Shellfish allergy, Tea, and Tomato    Review of Systems   Review of Systems  Respiratory:  Positive for shortness of breath.   Cardiovascular:  Positive for chest pain.    Physical Exam Updated Vital Signs BP (!) 133/92   Pulse 64   Temp 98.8 F (37.1 C) (Oral)   Resp 18   LMP 08/17/2016 (Approximate)   SpO2 100%  Physical Exam Vitals and nursing note reviewed.  Constitutional:      General: She is not in acute distress. HENT:      Head: Normocephalic and atraumatic.     Mouth/Throat:     Mouth: Mucous membranes are moist.     Pharynx: No oropharyngeal exudate or posterior oropharyngeal erythema.  Eyes:     General: No scleral icterus.       Right eye: No discharge.        Left eye: No discharge.     Conjunctiva/sclera: Conjunctivae normal.  Cardiovascular:     Rate and Rhythm: Normal rate and regular rhythm.     Pulses: Normal pulses.          Radial pulses are 2+ on the right side and 2+ on the left side.       Dorsalis pedis pulses are 2+ on the right side and 2+ on the left side.     Heart sounds: Normal heart sounds. No murmur heard. Pulmonary:     Effort: Pulmonary effort is normal. No respiratory distress.     Breath sounds: Normal breath sounds. No wheezing, rhonchi or rales.  Abdominal:     General: Bowel sounds are normal.     Palpations: Abdomen is soft.     Tenderness: There is no abdominal tenderness.  Musculoskeletal:     Right lower leg: No edema.     Left lower leg: No edema.     Comments: No tenderness to palpation of calves.  Skin:    General: Skin is warm and dry.     Findings: No rash.  Neurological:     General: No focal deficit present.     Mental Status: She is alert and oriented to person, place, and time. Mental status is at baseline.  Psychiatric:        Mood and Affect: Mood normal.        Behavior: Behavior normal.     ED Results / Procedures / Treatments   Labs (all labs ordered are listed, but only abnormal results are displayed) Labs Reviewed  BASIC METABOLIC PANEL  CBC  TROPONIN I (HIGH SENSITIVITY)  TROPONIN I (HIGH SENSITIVITY)    EKG None  Radiology DG Chest 2 View  Result Date: 05/11/2023 CLINICAL DATA:  cp EXAM: CHEST - 2 VIEW COMPARISON:  Chest x-ray 10/22/2022. FINDINGS: The heart size and mediastinal contours are within normal limits. Both lungs are clear. No visible pleural effusions or pneumothorax. No acute osseous abnormality. IMPRESSION: No  active cardiopulmonary disease. Electronically Signed   By: Feliberto Harts M.D.   On: 05/11/2023 12:54    Procedures Procedures    Medications Ordered in ED Medications  ipratropium-albuterol (DUONEB) 0.5-2.5 (3) MG/3ML nebulizer solution 3 mL (3 mLs Nebulization Given 05/11/23 1250)  acetaminophen (TYLENOL) tablet 1,000 mg (1,000 mg Oral Given 05/11/23 1400)  alum & mag hydroxide-simeth (MAALOX/MYLANTA) 200-200-20 MG/5ML suspension 15 mL (15 mLs Oral Given 05/11/23 1400)  famotidine (PEPCID) tablet  20 mg (20 mg Oral Given 05/11/23 1400)    ED Course/ Medical Decision Making/ A&P                                 Medical Decision Making Amount and/or Complexity of Data Reviewed Labs: ordered. Radiology: ordered.  Risk Prescription drug management.   This patient presents to the ED for concern of chest pain, this involves an extensive number of treatment options, and is a complaint that carries with it a high risk of complications and morbidity.  The differential diagnosis includes acute coronary syndrome, congestive heart failure, pericarditis, pneumonia, pulmonary embolism, tension pneumothorax, esophageal rupture, aortic dissection, cardiac tamponade, musculoskeletal   Co morbidities that complicate the patient evaluation  anxiety, asthma, GERD, fibromyalgia, palpitations    Additional history obtained:  Additional history obtained from Urgent Care note 05/11/2023: patient with chest pain - provided with 324mg  ASA and sent to ED.   Lab Tests:  I Ordered, and personally interpreted labs.  The pertinent results include:  - Troponin: Initial and repeat within normal limits - BMP: no concern for electrolyte abnormality; no concern for kidney damage - CBC: No concern for anemia or leukocytosis    Imaging Studies ordered:  I ordered imaging studies including - chest xray: To assess for process contributing to patient's symptoms I independently visualized and interpreted  imaging  I agree with the radiologist interpretation   Cardiac Monitoring: / EKG:  The patient was maintained on a cardiac monitor.  I personally viewed and interpreted the cardiac monitored which showed an underlying rhythm of: Sinus rhythm without acute ST changes or arrhythmias   Risk Stratification Score:  - HEART Score: 2 d/t age and HTN - Wells Score: 0   Problem List / ED Course / Critical interventions / Medication management  Patient presented for central chest pain, dry cough, and increased acid reflux. Patient received 324mg  ASA in UC.  Physical exam unremarkable. Patient ambulated in ED with O2 >97% on RA. WELLS DVT score 0.  CBC without leukocytosis or anemia.  BMP without electrolyte abnormalities.  Kidney/liver function test within normal limits.  Initial and repeat troponin within normal limits. Provided patient with a GI cocktail which resolved her symptoms.  Patient also with a history of asthma but has not been able to take her albuterol rescue inhaler.  Provided her with a DuoNeb which resolved her asthma symptoms.  Recommended patient follow-up with a primary care provider to manage her asthma and acid reflux.  Patient verbally endorsed understanding of plan. Patient stating that she is ready for discharge. I have reviewed the patients home medicines and have made adjustments as needed Patient was given return precautions. Patient stable for discharge at this time.  Patient verbalized understanding of plan. Discharged in good condition.  Ddx:  These are considered less likely due to history of present illness and physical exam findings.  -Acute coronary syndrome: EKG and troponins within normal limits  -Congestive heart failure: patient denies orthopnea, cough, and leg edema -Pericarditis: pain is not positional and patient denies orthopnea and recent illness -Pneumonia: lungs are clear to auscultation bilaterally -Pulmonary embolism: no recent surgeries, blood clot  hx, hemoptysis, cancer hx, vitals stable -Pneumothorax: lungs are clear to auscultation bilaterally -Esophageal rupture: patient denies vomiting, heavy drinking, and hx of GERD -Aortic dissection: vital signs are stable, no variation in pulse pressure -Cardiac tamponade: absence of hypotension, JVD, and muffled heart sounds  Social Determinants of Health:  none          Final Clinical Impression(s) / ED Diagnoses Final diagnoses:  Mild intermittent asthma without complication  Gastroesophageal reflux disease, unspecified whether esophagitis present    Rx / DC Orders ED Discharge Orders     None         Dorthy Cooler, [place] 05/11/23 1526    Anders Simmonds T, DO 05/12/23 1737

## 2023-05-11 NOTE — ED Notes (Signed)
Patient transported to X-ray 

## 2023-05-11 NOTE — Discharge Instructions (Addendum)
It was a pleasure caring for you today.  You were given Pepcid for acid reflux which seem to help your symptoms.  I recommend following up with your primary care provider within the next couple days.  Seek emergency care if experiencing any new or worsening symptoms.

## 2023-05-11 NOTE — ED Provider Notes (Signed)
HPI  SUBJECTIVE:  Katherine [name] is a 52 y.o. female who presents with 4 days of burning, pressure-like substernal chest pain that became constant this morning at 0500.  She reports palpitations all day.  She states that this pain goes up her neck, and radiates under her bilateral breasts.  She reports nausea, waterbrash, belching, diaphoresis, shortness of breath, dyspnea on exertion.  She has a cough beginning today, and states that she feels lightheaded with a headache.  No radiation of this pain down her arm, through to her back, wheezing, vomiting, abdominal pain, presyncope or syncope.  She tried bending forward, lying down, standing up, cool compresses without improvement in her symptoms.  Symptoms are better with rest and worse with exertion.  She has had symptoms like this before, but states that they are more severe than usual.  She denies change in physical activity or trauma to the chest.  She has a past medical history of asthma, GERD, fibromyalgia, SVT, breast cancer, syncope and is an occasional smoker.  PCP: Forgot.  Cardiologist: None.  Past Medical History:  Diagnosis Date   Anxiety    Asthma    Breast cancer (HCC)    Dysrhythmia    History of SVT-No medications   Fibromyalgia    GERD (gastroesophageal reflux disease)    History of radiation therapy 12/19/17- 01/15/18   Right Breast, 2.67 Gy in 15 fractions for a total dose of 40.05 Gy. 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   right breast   Migraine    Palpitations    Pneumonia    with cavitation of left lower lobe   SVT (supraventricular tachycardia)    Zio Monitor 11/2019: Patient only wore for 3 day (adhesive allergy); NSR, Avg HR 65, no arrhythmia   Tuberculosis    exposure as a child, get checked frequently   Uterine fibroid     Past Surgical History:  Procedure Laterality Date   ABDOMINAL HYSTERECTOMY      BREAST LUMPECTOMY Right 07/24/2017   BREAST LUMPECTOMY WITH RADIOACTIVE SEED AND SENTINEL LYMPH NODE BIOPSY Right 07/24/2017   Procedure: RIGHT BREAST LUMPECTOMY WITH RADIOACTIVE SEED AND SENTINEL LYMPH NODE BIOPSY;  Surgeon: Harriette Bouillon, MD;  Location: [facility];  Service: General;  Laterality: Right;   PORT-A-CATH REMOVAL Right 06/18/2019   Procedure: REMOVAL PORT-A-CATH;  Surgeon: Harriette Bouillon, MD;  Location: [facility];  Service: General;  Laterality: Right;   PORTACATH PLACEMENT Right 07/24/2017   Procedure: INSERTION PORT-A-CATH;  Surgeon: Harriette Bouillon, MD;  Location:  SURGERY CENTER;  Service: General;  Laterality: Right;   TUBAL LIGATION  1993   VAGINAL HYSTERECTOMY Bilateral 12/19/2016   Procedure: HYSTERECTOMY VAGINAL;  Surgeon: Willodean Rosenthal, MD;  Location: [facility];  Service: Gynecology;  Laterality: Bilateral;   WISDOM TOOTH EXTRACTION      Family History  Problem Relation Age of Onset   Migraines Daughter    Hypertension Mother    Heart disease Mother    Hypertension Father    Heart disease Father    Migraines Son    Hypertension Other    Colon cancer Neg Hx    Colon polyps Neg Hx    Diabetes Neg Hx    Kidney disease Neg Hx    Liver disease Neg Hx    Heart attack Neg Hx  Stroke Neg Hx     Social History   Tobacco Use   Smoking status: Former    Current packs/day: 0.00    Types: Cigarettes    Quit date: 07/16/2017    Years since quitting: 5.8   Smokeless tobacco: Never   Tobacco comments:    smokes 1cigarette per day and more if she is stressed  Vaping Use   Vaping status: Never Used  Substance Use Topics   Alcohol use: No    Alcohol/week: 0.0 standard drinks of alcohol   Drug use: Not Currently    Types: Marijuana    No current facility-administered medications for this encounter.  Current Outpatient Medications:    albuterol (PROVENTIL HFA;VENTOLIN HFA) 108 (90 Base) MCG/ACT inhaler,  Inhale 2 puffs into the lungs every 6 (six) hours as needed for wheezing or shortness of breath., Disp: , Rfl:    cetirizine (ZYRTEC) 10 MG tablet, Take 10 mg by mouth daily., Disp: , Rfl:    dicyclomine (BENTYL) 20 MG tablet, Take 1 tablet (20 mg total) by mouth 2 (two) times daily., Disp: 6 tablet, Rfl: 0   DULoxetine (CYMBALTA) 30 MG capsule, TAKE 1 CAPSULE (30 MG TOTAL) BY MOUTH DAILY. X 1 WEEK THEN TAKE 2 CAPSULES DAILY (Patient taking differently: Take 60 mg by mouth daily.), Disp: 60 capsule, Rfl: 5   fluticasone (FLONASE) 50 MCG/ACT nasal spray, Place 2 sprays into both nostrils daily., Disp: 9.9 mL, Rfl: 0   hydrocortisone cream 1 %, Apply to affected area 2 times daily, Disp: 15 g, Rfl: 0   ibuprofen (ADVIL) 600 MG tablet, TAKE 1 TABLET (600 MG TOTAL) BY MOUTH EVERY 6 (SIX) HOURS AS NEEDED FOR MILD PAIN, Disp: 60 tablet, Rfl: 3   metoprolol succinate (TOPROL-XL) 25 MG 24 hr tablet, Take 25 mg by mouth daily., Disp: , Rfl:    metroNIDAZOLE (FLAGYL) 500 MG tablet, Take 1 tablet (500 mg total) by mouth 2 (two) times daily., Disp: 14 tablet, Rfl: 0   omeprazole (PRILOSEC) 20 MG capsule, Take 1 capsule (20 mg total) by mouth daily., Disp: 30 capsule, Rfl: 0   omeprazole (PRILOSEC) 40 MG capsule, Take 40 mg by mouth every morning., Disp: , Rfl:    ondansetron (ZOFRAN) 4 MG tablet, Take 1 tablet (4 mg total) by mouth every 6 (six) hours., Disp: 12 tablet, Rfl: 0   ondansetron (ZOFRAN-ODT) 4 MG disintegrating tablet, Take 1 tablet (4 mg total) by mouth every 8 (eight) hours as needed for nausea or vomiting., Disp: 10 tablet, Rfl: 0   RESTASIS 0.05 % ophthalmic emulsion, Place 1 drop into both eyes 2 (two) times daily., Disp: , Rfl:    sucralfate (CARAFATE) 1 g tablet, Take 1 tablet (1 g total) by mouth 4 (four) times daily -  with meals and at bedtime., Disp: 120 tablet, Rfl: 0   SYMBICORT 80-4.5 MCG/ACT inhaler, Inhale 2 puffs into the lungs 3 (three) times daily., Disp: , Rfl:   Allergies   Allergen Reactions   Shellfish Allergy Anaphylaxis   Tea Nausea And Vomiting   Tomato Rash     ROS  As noted in HPI.   Physical Exam  BP (!) 123/101 (BP Location: Right Arm)   Pulse (!) 52   Temp 98.3 F (36.8 C) (Oral)   Resp 16   LMP 08/17/2016 (Approximate)   SpO2 100%   BP Readings from Last 3 Encounters:  05/11/23 (!) 123/101  11/16/22 113/75  10/23/22 119/76     Constitutional: Well  developed, well nourished, no acute distress Eyes:  EOMI, conjunctiva normal bilaterally HENT: Normocephalic, atraumatic,mucus membranes moist Respiratory: Normal inspiratory effort, lungs clear bilaterally.  Good air movement. Cardiovascular: Regular bradycardia, no murmurs rubs or gallops.  Positive diffuse chest wall tenderness. GI: nondistended skin: No rash, skin intact Musculoskeletal: no deformities Neurologic: Alert & oriented x 3, no focal neuro deficits Psychiatric: Speech and behavior appropriate   ED Course   Medications  aspirin chewable tablet 324 mg (324 mg Oral Given 05/11/23 1118)    Orders Placed This Encounter  Procedures   ED EKG    Standing Status:   Standing    Number of Occurrences:   1    Order Specific Question:   Reason for Exam    Answer:   Chest Pain    No results found for this or any previous visit (from the past 24 hour(s)). No results found.  ED Clinical Impression  1. Chest pain, unspecified type      ED Assessment/Plan     EKG: Sinus bradycardia, rate 51.  Right axis deviation.  Normal intervals.  No ST elevation.  No change compared to EKG from 10/23/2022.  Patient was mildly symptomatic when EKG was obtained.  HEART score:   History: Moderately suspicious +1 EKG: Nonspecific repolarization disturbance +1 Age: 29-60 4+1 Risk factors: 1 risk factor +1 Troponin: Not available  Total HEARt score 4 points.  She is at moderate risk of MACE.   This could be a number of different things including GERD, asthma exacerbation,  costochondritis, or atypical presentation of ACS.  Doubt PE, dissection.  Her EKG is unchanged compared to previous.  Feel that patient needs further evaluation that is outside the scope with the urgent care as she has a moderate HEARt score.  Transferring to the emergency department for comprehensive evaluation.  Giving 324 mg of aspirin here.  She is stable to go by private vehicle given her normal EKG.  Patient also does not want to go via EMS.  Discussed rationale for transfer to the emergency department with patient.  She agrees to go.   Meds ordered this encounter  Medications   aspirin chewable tablet 324 mg      *This clinic note was created using Scientist, clinical (histocompatibility and immunogenetics). Therefore, there may be occasional mistakes despite careful proofreading.  ?    Domenick Gong, MD 05/11/23 1123

## 2023-05-11 NOTE — ED Triage Notes (Signed)
Pt states CP epigastric with palpitations and headache for the past 5 days.

## 2023-05-11 NOTE — ED Notes (Signed)
Ambulating oximetry on room air maintained 97%-100%, ambulated without any assistance

## 2023-05-11 NOTE — ED Triage Notes (Addendum)
Pt came in via POV from UC d/t HTN & central CP that started 4 days ago & states it was worse at 0500 this morning. Endorses Lt shoulder pain, leg cramps & exacerbation of her asthma as well with intermittent exertional SOB. A/Ox4, rates pain 9/10.

## 2023-05-11 NOTE — ED Notes (Signed)
Discharge instructions discussed with pt. Verbalized understanding. VSS. No questions or concerns regarding discharge  

## 2023-05-11 NOTE — Discharge Instructions (Signed)
Go immediately to the emergency department.  Your blood pressure is elevated today compared to normal.  While your EKG is unchanged from your EKG done in January, we need to sort out the cause of your chest pain, whether it be acid reflux, an asthma exacerbation, or an atypical presentation of acute coronary syndrome.  Let them know if your chest pain changes or gets worse.

## 2023-08-05 NOTE — Progress Notes (Unsigned)
  Cardiology Office Note:  .   Date:  08/05/2023  ID:  Katherine [name], DOB June 25, 1971, MRN [id number] PCP: Verlon Au, MD  [facility] Providers Cardiologist:  Dietrich Pates, MD Electrophysiologist:  Lewayne Bunting, MD {  History of Present Illness: .   Katherine [name] is a 52 y.o. female w/PMHx of HTN, asthma, GERD, SVT, migraine HAs  She saw Dr. Ladona Ridgel 07/25/22, she requested her ILR be removed.  Placed for palpitations, noted some asymptomatic bradycardia, palpitations, resolved and pt wanted the device removed.  Several ER visits noted  08/22/22, abd pain, nausea, CP: neg Trops, tx with Carafate, omeprazole, and Zofran suspect GI source. CT without acute findings  10/23/22: L breat and leg pain, Trops neg  11/16/22: N/V/D, abd pain  8/16/4: UCC palpitations, pain neck b/l breasts, Nause, belching, diaphoresis, SOB, cough, lightheaded. HA, EKG reported as stable, recommended ER for further evaluation, pt planned to go/drive herself ER: tx with nebulizer, Tylenol, maalox, pepcid Suspect to be GERD, asthma trigger.   Today's visit is scheduled as an annual visit  ROS: ***  *** symptoms   Device information MDT loop implanted 04/01/20 >> removed 07/25/22 at pt request  Arrhythmia/AAD hx SVT is mentioned in her chart back 2014  Studies Reviewed: Marland Kitchen    EKG done today and reviewed by myself:  ***   01/10/2021: TTE 1. Left ventricular ejection fraction, by estimation, is 60 to 65%. The  left ventricle has normal function. The left ventricle has no regional  wall motion abnormalities. Left ventricular diastolic parameters were  normal.   2. Right ventricular systolic function is normal. The right ventricular  size is normal. There is normal pulmonary artery systolic pressure. The  estimated right ventricular systolic pressure is 17.3 mmHg.   3. Left atrial size was mildly dilated.   4. The mitral valve is normal in structure. Trivial mitral valve  regurgitation. No  evidence of mitral stenosis.   5. The aortic valve is tricuspid. Aortic valve regurgitation is not  visualized. No aortic stenosis is present.   6. The inferior vena cava is normal in size with greater than 50%  respiratory variability, suggesting right atrial pressure of 3 mmHg.    March 2021: Monitor Sinus rhythm  47 t0 117 bpm    AVerage HR 65 bpm No arrhythmias detected   Risk Assessment/Calculations:    Physical Exam:   VS:  LMP 08/17/2016 (Approximate)    Wt Readings from Last 3 Encounters:  11/16/22 138 lb 14.2 oz (63 kg)  07/25/22 132 lb 6.4 oz (60.1 kg)  10/13/21 142 lb 11.2 oz (64.7 kg)    GEN: Well nourished, well developed in no acute distress NECK: No JVD; No carotid bruits CARDIAC: ***RRR, no murmurs, rubs, gallops RESPIRATORY:  *** CTA b/l without rales, wheezing or rhonchi  ABDOMEN: Soft, non-tender, non-distended EXTREMITIES:  No edema; No deformity    ASSESSMENT AND PLAN: .    SVT Palpitations ***  HTN ***      {Are you ordering a CV Procedure (e.g. stress test, cath, DCCV, TEE, etc)?   Press F2        :[id number]}     Dispo: ***  Signed, Sheilah Pigeon, PA-C

## 2023-08-06 ENCOUNTER — Encounter: Payer: Self-pay | Admitting: Physician Assistant

## 2023-08-06 ENCOUNTER — Ambulatory Visit: Payer: Medicaid Other | Attending: Physician Assistant | Admitting: Physician Assistant

## 2023-08-06 VITALS — BP 118/60 | HR 78 | Ht 65.0 in | Wt 151.2 lb

## 2023-08-06 DIAGNOSIS — I10 Essential (primary) hypertension: Secondary | ICD-10-CM

## 2023-08-06 DIAGNOSIS — R002 Palpitations: Secondary | ICD-10-CM | POA: Diagnosis not present

## 2023-08-06 DIAGNOSIS — I471 Supraventricular tachycardia, unspecified: Secondary | ICD-10-CM

## 2023-08-06 DIAGNOSIS — I4719 Other supraventricular tachycardia: Secondary | ICD-10-CM

## 2023-08-06 MED ORDER — METOPROLOL SUCCINATE ER 25 MG PO TB24
25.0000 mg | ORAL_TABLET | Freq: Two times a day (BID) | ORAL | 3 refills | Status: AC
Start: 2023-08-06 — End: ?

## 2023-08-06 NOTE — Patient Instructions (Signed)
Medication Instructions:  Your physician has recommended you make the following change in your medication:   1) INCREASE metoprolol succinate (Toprol XL) to 25 mg twice daily--this is your heart medication, it helps to control your heart rate.  *If you need a refill on your cardiac medications before your next appointment, please call your pharmacy*  Lab Work: None ordered today.  Testing/Procedures: None ordered today.  Follow-Up: At [facility], you and your health needs are our priority.  As part of our continuing mission to provide you with exceptional heart care, we have created designated Provider Care Teams.  These Care Teams include your primary Cardiologist (physician) and Advanced Practice Providers (APPs -  Physician Assistants and Nurse Practitioners) who all work together to provide you with the care you need, when you need it.  Your next appointment:   1 month(s)  The format for your next appointment:   In Person  Provider:   You may see Lewayne Bunting, MD or one of the following Advanced Practice Providers on your designated Care Team:   Francis Dowse, [place]

## 2023-08-26 NOTE — Progress Notes (Unsigned)
Cardiology Office Note:  .   Date:  08/26/2023  ID:  Katherine [name], DOB 1971-04-26, MRN [id number] PCP: Verlon Au, MD  [facility] Providers Cardiologist:  Dietrich Pates, MD Electrophysiologist:  Lewayne Bunting, MD {  History of Present Illness: .   Katherine [name] is a 52 y.o. female w/PMHx of HTN, asthma, GERD, SVT, migraine HAs  She saw Dr. Ladona Ridgel 07/25/22, she requested her ILR be removed.  Placed for palpitations, noted some asymptomatic bradycardia, palpitations, resolved and pt wanted the device removed.  Several ER visits noted  08/22/22, abd pain, nausea, CP: neg Trops, tx with Carafate, omeprazole, and Zofran suspect GI source. CT without acute findings  10/23/22: L breat and leg pain, Trops neg  11/16/22: N/V/D, abd pain  8/16/4: UCC palpitations, pain neck b/l breasts, Nause, belching, diaphoresis, SOB, cough, lightheaded. HA, EKG reported as stable, recommended ER for further evaluation, pt planned to go/drive herself ER: tx with nebulizer, Tylenol, maalox, pepcid Suspect to be GERD, asthma trigger.  I saw her 08/06/23 Reports she has also stated to have hot flashes and they seem to trigger her her heart racing She is having racing a couple times a week at least, can last hours Putting cold packs on her chest, back of her neck and relaxing can help Smoking marijuana also seems to help and she asks for a prescription for it. 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 No near syncope or syncope No SOB    Device information MDT loop implanted 04/01/20 >> removed 07/25/22 at pt request  Arrhythmia/AAD hx SVT is mentioned in her chart back 2014  Studies Reviewed: Marland Kitchen    EKG done today and reviewed by myself SB 57bpm, no acute/ischemic changes   01/10/2021: TTE 1. Left ventricular ejection fraction, by estimation, is 60 to 65%. The  left ventricle has normal function. The left ventricle has no regional  wall motion abnormalities. Left ventricular diastolic parameters were  normal.   2. Right ventricular systolic function is normal. The right ventricular  size is normal. There is normal pulmonary artery systolic pressure. The  estimated right ventricular systolic pressure is 17.3 mmHg.   3. Left atrial size was mildly dilated.   4. The mitral valve is normal in structure. Trivial mitral valve  regurgitation. No evidence of mitral stenosis.   5. The aortic valve is tricuspid. Aortic valve regurgitation is not  visualized. No aortic stenosis is present.   6. The inferior vena cava is normal in size with greater than 50%  respiratory variability, suggesting right atrial pressure of 3 mmHg.    March 2021: Monitor Sinus rhythm  47 t0 117 bpm    AVerage HR 65 bpm No arrhythmias detected   Risk Assessment/Calculations:    Physical Exam:   VS:  LMP 08/17/2016 (Approximate)  Wt Readings from Last 3 Encounters:  08/06/23 151 lb 3.2 oz (68.6 kg)  11/16/22 138 lb 14.2 oz (63 kg)  07/25/22 132 lb 6.4 oz (60.1 kg)    GEN: Well nourished, well developed in no acute distress NECK: No JVD; No carotid bruits CARDIAC: RRR, no murmurs, rubs, gallops RESPIRATORY: CTA b/l without rales, wheezing or rhonchi  ABDOMEN: Soft, non-tender, non-distended EXTREMITIES:  No edema; No deformity    ASSESSMENT AND PLAN: .    SVT Palpitations  A recheck on her  BP by myself is 118/72  She reports frequency as sometimes daily or a couple a month In review of her last 4 mo of loop monitoring she had no tachy or arrhythmia episodes at all  Uncertain exactly what her symptoms are She is willing to wear another monitor I have asked her to mark symptoms when she has the palpitations  HTN Looks ok   Dispo: back in 6-8 weeks, sooner if needed  Signed, Sheilah Pigeon, PA-C

## 2023-08-29 ENCOUNTER — Encounter: Payer: Self-pay | Admitting: *Deleted

## 2023-08-29 ENCOUNTER — Ambulatory Visit: Payer: Medicaid Other | Attending: Physician Assistant | Admitting: Physician Assistant

## 2023-08-29 ENCOUNTER — Encounter: Payer: Self-pay | Admitting: Physician Assistant

## 2023-08-29 ENCOUNTER — Ambulatory Visit: Payer: Medicaid Other | Attending: Physician Assistant

## 2023-08-29 VITALS — BP 87/65 | HR 67 | Ht 65.0 in | Wt 149.0 lb

## 2023-08-29 DIAGNOSIS — R002 Palpitations: Secondary | ICD-10-CM | POA: Diagnosis not present

## 2023-08-29 DIAGNOSIS — I471 Supraventricular tachycardia, unspecified: Secondary | ICD-10-CM | POA: Diagnosis not present

## 2023-08-29 DIAGNOSIS — I10 Essential (primary) hypertension: Secondary | ICD-10-CM | POA: Diagnosis not present

## 2023-08-29 NOTE — Patient Instructions (Addendum)
Medication Instructions:  No medication changes  *If you need a refill on your cardiac medications before your next appointment, please call your pharmacy*   Lab Work: No labs ordered  If you have labs (blood work) drawn today and your tests are completely normal, you will receive your results only by: MyChart Message (if you have MyChart) OR A paper copy in the mail If you have any lab test that is abnormal or we need to change your treatment, we will call you to review the results.   Testing/Procedures: Katherine [name]- Long Term Monitor Instructions  Your physician has requested you wear a ZIO patch monitor for 14 days.  This is a single patch monitor. [employer] supplies one patch monitor per enrollment. Additional stickers are not available. Please do not apply patch if you will be having a Nuclear Stress Test,  Echocardiogram, Cardiac CT, MRI, or Chest Xray during the period you would be wearing the  monitor. The patch cannot be worn during these tests. You cannot remove and re-apply the  ZIO XT patch monitor.  Your ZIO patch monitor will be mailed 3 day USPS to your address on file. It may take 3-5 days  to receive your monitor after you have been enrolled.  Once you have received your monitor, please review the enclosed instructions. Your monitor  has already been registered assigning a specific monitor serial # to you.  Billing and Patient Assistance Program Information  We have supplied [employer] with any of your insurance information on file for billing purposes. [employer] offers a sliding scale Patient Assistance Program for patients that do not have  insurance, or whose insurance does not completely cover the cost of the ZIO monitor.  You must apply for the Patient Assistance Program to qualify for this discounted rate.  To apply, please call [employer] at [phone], select option 4, select option 2, ask to apply for  Patient Assistance Program. Katherine [name] will ask your household  income, and how many people  are in your household. They will quote your out-of-pocket cost based on that information.  [employer] will also be able to set up a 75-month, interest-free payment plan if needed.  Applying the monitor   Shave hair from upper left chest.  Hold abrader disc by orange tab. Rub abrader in 40 strokes over the upper left chest as  indicated in your monitor instructions.  Clean area with 4 enclosed alcohol pads. Let dry.  Apply patch as indicated in monitor instructions. Patch will be placed under collarbone on left  side of chest with arrow pointing upward.  Rub patch adhesive wings for 2 minutes. Remove white label marked "1". Remove the white  label marked "2". Rub patch adhesive wings for 2 additional minutes.  While looking in a mirror, press and release button in center of patch. A small green light will  flash 3-4 times. This will be your only indicator that the monitor has been turned on.  Do not shower for the first 24 hours. You may shower after the first 24 hours.  Press the button if you feel a symptom. You will hear a small click. Record Date, Time and  Symptom in the Patient Logbook.  When you are ready to remove the patch, follow instructions on the last 2 pages of Patient  Logbook. Stick patch monitor onto the last page of Patient Logbook.  Place Patient Logbook in the blue and white box. Use locking tab on box and tape box closed  securely. The  blue and white box has prepaid postage on it. Please place it in the mailbox as  soon as possible. Your physician should have your test results approximately 7 days after the  monitor has been mailed back to [facility].  Call [facility] Customer Care at [phone] if you have questions regarding  your ZIO XT patch monitor. Call them immediately if you see an orange light blinking on your  monitor.  If your monitor falls off in less than 4 days, contact our Monitor department at [phone].  If your  monitor becomes loose or falls off after 4 days call [employer] at [phone] for  suggestions on securing your monitor    Follow-Up: At [facility], you and your health needs are our priority.  As part of our continuing mission to provide you with exceptional heart care, we have created designated Provider Care Teams.  These Care Teams include your primary Cardiologist (physician) and Advanced Practice Providers (APPs -  Physician Assistants and Nurse Practitioners) who all work together to provide you with the care you need, when you need it.  We recommend signing up for the patient portal called "MyChart".  Sign up information is provided on this After Visit Summary.  MyChart is used to connect with patients for Virtual Visits (Telemedicine).  Patients are able to view lab/test results, encounter notes, upcoming appointments, etc.  Non-urgent messages can be sent to your provider as well.   To learn more about what you can do with MyChart, go to [email or web address].    Your next appointment:   6-8 week(s)  Provider:   Francis Dowse, PA-C  Other Instructions

## 2023-08-29 NOTE — Progress Notes (Unsigned)
Enrolled patient for a 14 day Zio XT monitor to be mailed to patients home  Katherine [name] to read

## 2023-09-01 DIAGNOSIS — R002 Palpitations: Secondary | ICD-10-CM | POA: Diagnosis not present

## 2023-10-20 NOTE — Progress Notes (Unsigned)
Cardiology Office Note:  .   Date:  10/20/2023  ID:  Katherine [name], DOB 08-28-1971, MRN [id number] PCP: Verlon Au, MD  [facility] Providers Cardiologist:  Dietrich Pates, MD Electrophysiologist:  Lewayne Bunting, MD {  History of Present Illness: .   Katherine [name] is a 53 y.o. female w/PMHx of HTN, asthma, GERD, SVT, migraine HAs  She saw Dr. Ladona Ridgel 07/25/22, she requested her ILR be removed.  Placed for palpitations, noted some asymptomatic bradycardia, palpitations, resolved and pt wanted the device removed.  Several ER visits noted  08/22/22, abd pain, nausea, CP: neg Trops, tx with Carafate, omeprazole, and Zofran suspect GI source. CT without acute findings  10/23/22: L breat and leg pain, Trops neg  11/16/22: N/V/D, abd pain  8/16/4: UCC palpitations, pain neck b/l breasts, Nause, belching, diaphoresis, SOB, cough, lightheaded. HA, EKG reported as stable, recommended ER for further evaluation, pt planned to go/drive herself ER: tx with nebulizer, Tylenol, maalox, pepcid Suspect to be GERD, asthma trigger.  I saw her 08/06/23 Reports she has also stated to have hot flashes and they seem to trigger her her heart racing She is having racing a couple times a week at least, can last hours Putting cold packs on her chest, back of her neck and relaxing can help Smoking marijuana also seems to help and she asks for a prescription for it. 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 No near syncope or syncope No SOB  Monitor looked good, SR, rare PVCs,  No pauses, VT or SVT or atrial fib.   Symptom tracings w/SR  Today's visit is scheduled as a 6-8 week f/u visit  ROS:   *** symptoms not HR/rhythm related *** BP? *** CP? *** PMD/GYN for alternative etiologies/management strategies  Device information MDT loop implanted 04/01/20 >> removed 07/25/22 at pt request  Arrhythmia/AAD hx SVT is mentioned in her chart back 2014  Studies Reviewed: Marland Kitchen    EKG not done today  01/10/2021: TTE 1. Left ventricular ejection fraction, by estimation, is 60 to 65%. The  left ventricle has normal function. The left ventricle has no regional  wall motion abnormalities. Left ventricular diastolic parameters were  normal.   2. Right ventricular systolic function is normal. The right ventricular  size is normal. There is normal pulmonary artery systolic pressure. The  estimated right ventricular systolic pressure is 17.3 mmHg.   3. Left atrial size was mildly dilated.   4. The mitral valve is normal in structure. Trivial mitral valve  regurgitation. No evidence of mitral stenosis.   5. The aortic valve is tricuspid. Aortic valve regurgitation is not  visualized. No aortic stenosis is present.   6. The inferior vena cava is normal in size with greater than 50%  respiratory variability, suggesting right atrial pressure of 3 mmHg.    March 2021: Monitor Sinus rhythm  47  t0 117 bpm    AVerage HR 65 bpm No arrhythmias detected   Risk Assessment/Calculations:    Physical Exam:   VS:  LMP 08/17/2016 (Approximate)    Wt Readings from Last 3 Encounters:  08/29/23 149 lb (67.6 kg)  08/06/23 151 lb 3.2 oz (68.6 kg)  11/16/22 138 lb 14.2 oz (63 kg)    GEN: Well nourished, well developed in no acute distress NECK: No JVD; No carotid bruits CARDIAC: *** RRR, no murmurs, rubs, gallops RESPIRATORY:  ***CTA b/l  without rales, wheezing or rhonchi  ABDOMEN: Soft, non-tender, non-distended EXTREMITIES:  *** No edema; No deformity    ASSESSMENT AND PLAN: .    SVT Palpitations ***  HTN *** Looks ok   Dispo: ***  Signed, Sheilah Pigeon, PA-C

## 2023-10-23 ENCOUNTER — Ambulatory Visit: Payer: Medicaid Other | Attending: Physician Assistant | Admitting: Physician Assistant

## 2023-10-23 VITALS — BP 116/74 | HR 62 | Ht 65.0 in | Wt 149.6 lb

## 2023-10-23 DIAGNOSIS — I471 Supraventricular tachycardia, unspecified: Secondary | ICD-10-CM | POA: Diagnosis not present

## 2023-10-23 DIAGNOSIS — I10 Essential (primary) hypertension: Secondary | ICD-10-CM | POA: Diagnosis not present

## 2023-10-23 NOTE — Patient Instructions (Signed)
Medication Instructions:    Your physician recommends that you continue on your current medications as directed. Please refer to the Current Medication list given to you today.   *If you need a refill on your cardiac medications before your next appointment, please call your pharmacy*   Lab Work:  NONE ORDERED  TODAY   If you have labs (blood work) drawn today and your tests are completely normal, you will receive your results only by: MyChart Message (if you have MyChart) OR A paper copy in the mail If you have any lab test that is abnormal or we need to change your treatment, we will call you to review the results.   Testing/Procedures:  NONE ORDERED  TODAY    Follow-Up: At [facility], you and your health needs are our priority.  As part of our continuing mission to provide you with exceptional heart care, we have created designated Provider Care Teams.  These Care Teams include your primary Cardiologist (physician) and Advanced Practice Providers (APPs -  Physician Assistants and Nurse Practitioners) who all work together to provide you with the care you need, when you need it.  We recommend signing up for the patient portal called "MyChart".  Sign up information is provided on this After Visit Summary.  MyChart is used to connect with patients for Virtual Visits (Telemedicine).  Patients are able to view lab/test results, encounter notes, upcoming appointments, etc.  Non-urgent messages can be sent to your provider as well.   To learn more about what you can do with MyChart, go to [email or web address].    Your next appointment:    6 month(s)  Provider:    You may see Lewayne Bunting, MD or one of the following Advanced Practice Providers on your designated Care Team:   Canary Brim, NP    Other Instructions

## 2024-01-21 ENCOUNTER — Emergency Department (HOSPITAL_COMMUNITY)

## 2024-01-21 ENCOUNTER — Emergency Department (HOSPITAL_COMMUNITY)
Admission: EM | Admit: 2024-01-21 | Discharge: 2024-01-21 | Disposition: A | Discharge status: Home / Self Care | Attending: Emergency Medicine | Admitting: Emergency Medicine

## 2024-01-21 ENCOUNTER — Encounter (HOSPITAL_COMMUNITY): Payer: Self-pay

## 2024-01-21 ENCOUNTER — Other Ambulatory Visit: Payer: Self-pay

## 2024-01-21 DIAGNOSIS — S32010A Wedge compression fracture of first lumbar vertebra, initial encounter for closed fracture: Secondary | ICD-10-CM | POA: Insufficient documentation

## 2024-01-21 DIAGNOSIS — M545 Low back pain, unspecified: Secondary | ICD-10-CM | POA: Diagnosis present

## 2024-01-21 DIAGNOSIS — J45909 Unspecified asthma, uncomplicated: Secondary | ICD-10-CM | POA: Insufficient documentation

## 2024-01-21 DIAGNOSIS — W19XXXA Unspecified fall, initial encounter: Secondary | ICD-10-CM

## 2024-01-21 MED ORDER — OXYCODONE-ACETAMINOPHEN 5-325 MG PO TABS
1.0000 | ORAL_TABLET | ORAL | Status: DC | PRN
  Administered 2024-01-21: 1 via ORAL
  Filled 2024-01-21: qty 1

## 2024-01-21 MED ORDER — OXYCODONE-ACETAMINOPHEN 5-325 MG PO TABS: 1.0000 | ORAL_TABLET | Freq: Four times a day (QID) | ORAL | 0 refills | Status: AC | PRN

## 2024-01-21 MED ORDER — IBUPROFEN 400 MG PO TABS
400.0000 mg | ORAL_TABLET | Freq: Once | ORAL | Status: AC | PRN
  Administered 2024-01-21: 400 mg via ORAL
  Filled 2024-01-21: qty 1

## 2024-01-21 MED ORDER — LIDOCAINE 5 % EX PTCH: 1.0000 | MEDICATED_PATCH | CUTANEOUS | 0 refills | Status: AC
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
## 2024-01-21 NOTE — Progress Notes (Signed)
 Orthopedic Tech Progress Note Patient Details:  Katherine [name] 22-Mar-1971 [id number]  Ortho Devices Type of Ortho Device: Thoracolumbar corset (TLSO) Ortho Device/Splint Location: Back Ortho Device/Splint Interventions: Application, Ordered, Adjustment   Post Interventions Patient Tolerated: Well  Melondy Blanchard A Wilfrido Luedke 01/21/2024, 5:14 PM

## 2024-01-21 NOTE — ED Notes (Signed)
 Awaiting ortho tech for TLSO brace.
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
## 2024-02-21 ENCOUNTER — Encounter: Payer: Self-pay | Admitting: Obstetrics and Gynecology

## 2024-02-21 ENCOUNTER — Encounter: Admitting: Obstetrics and Gynecology

## 2024-02-21 NOTE — Progress Notes (Deleted)
   NEW GYNECOLOGY VISIT  Subjective:  Katherine [name] is a 53 y.o. [phone] s/p TVH with hx breast cancer presenting for discussion of hot flashes  Hx TVH for AUB, path benign.  Hx breast cancer ([facility]) dx 2018 On duloxetine  30mg  daily   Menstrual Hx:  PMH: PSH: Meds: All: OB: Pap Hx:  Soc: FHx:  Objective:  There were no vitals filed for this visit.  General:  Alert, oriented and cooperative. Patient is in no acute distress.  Skin: Skin is warm and dry. No rash noted.   Cardiovascular: Normal heart rate noted  Respiratory: Normal respiratory effort, no problems with respiration noted  Abdomen: Soft, non-tender, non-distended   Pelvic: NEFG.   Exam performed in the presence of a chaperone  Assessment and Plan:  Katherine [name] is a 53 y.o. with history of breast cancer presenting for discussion of hot flashes  On duloxetine  (SNRI) - could up dose to 60mg  daily  - We discussed non-hormonal options with first line being SSRIs/SNRIs  -- Reviewed that it can take a few days to weeks to see the effect  -- Paxil (paroxetine) is the only FDA-approved anti-depressant for the treatment of hot flashes. There is a low dose option (7.5mg /d), but insurance coverage is limited.  -- Citalopram, escitalopram, venlafaxine , and desvenlafaxine are other options though venlafaxine  & desvenlafaxine tend to have more side effects (nausea/vomiting) -- Gabapentin  is an option for women who don't tolerate SSRI/SNRI's and seem to help women whose hot flashes are worst at night. Side effects include headache, dizziness, and drowsiness  -- Fezolinetant (Veozah) - neurokinin 3 receptor (NK3R) antagonist that works at the level of the hypothalamus. This is not an antidepressant. There can be rare liver issue related to the medicine, so the FDA recommends getting liver enzymes before starting treatment and every 3 months for the first 9 months of treatment.  - We discussed the possibility of using a  progesterone-only method for treatment including aygestin 10mg  daily or micronized progesterone 300mg  daily. There are no long term studies that look at safety of long term use of progesterone-only treatments for hot flashes.   No follow-ups on file.  Future Appointments  Date Time Provider Department Center  02/21/2024  8:15 AM Izell Marsh, MD [facility] None    Izell Marsh, MD

## 2024-03-07 ENCOUNTER — Emergency Department (HOSPITAL_COMMUNITY)
Admission: EM | Admit: 2024-03-07 | Discharge: 2024-03-07 | Disposition: A | Discharge status: Home / Self Care | Attending: Emergency Medicine | Admitting: Emergency Medicine

## 2024-03-07 ENCOUNTER — Other Ambulatory Visit: Payer: Self-pay

## 2024-03-07 ENCOUNTER — Emergency Department (HOSPITAL_COMMUNITY)

## 2024-03-07 ENCOUNTER — Encounter (HOSPITAL_COMMUNITY): Payer: Self-pay

## 2024-03-07 DIAGNOSIS — R112 Nausea with vomiting, unspecified: Secondary | ICD-10-CM | POA: Diagnosis present

## 2024-03-07 DIAGNOSIS — R197 Diarrhea, unspecified: Secondary | ICD-10-CM | POA: Diagnosis not present

## 2024-03-07 LAB — URINALYSIS, ROUTINE W REFLEX MICROSCOPIC
Bilirubin Urine: NEGATIVE
Glucose, UA: NEGATIVE mg/dL
Hgb urine dipstick: NEGATIVE
Ketones, ur: 5 mg/dL — AB
Leukocytes,Ua: NEGATIVE
Nitrite: NEGATIVE
Protein, ur: NEGATIVE mg/dL
Specific Gravity, Urine: 1.018 (ref 1.005–1.030)
pH: 6 (ref 5.0–8.0)

## 2024-03-07 LAB — COMPREHENSIVE METABOLIC PANEL WITH GFR
ALT: 12 U/L (ref 0–44)
AST: 19 U/L (ref 15–41)
Albumin: 4.1 g/dL (ref 3.5–5.0)
Alkaline Phosphatase: 71 U/L (ref 38–126)
Anion gap: 10 (ref 5–15)
BUN: 8 mg/dL (ref 6–20)
CO2: 24 mmol/L (ref 22–32)
Calcium: 9.5 mg/dL (ref 8.9–10.3)
Chloride: 106 mmol/L (ref 98–111)
Creatinine, Ser: 0.56 mg/dL (ref 0.44–1.00)
GFR, Estimated: >60 mL/min (ref >60)
Glucose, Bld: 105 mg/dL — ABNORMAL HIGH (ref 70–99)
Potassium: 4.4 mmol/L (ref 3.5–5.1)
Sodium: 140 mmol/L (ref 135–145)
Total Bilirubin: 0.5 mg/dL (ref 0.0–1.2)
Total Protein: 7.2 g/dL (ref 6.5–8.1)

## 2024-03-07 LAB — CBC WITH DIFFERENTIAL/PLATELET
Abs Immature Granulocytes: 0.11 10*3/uL — ABNORMAL HIGH (ref 0.00–0.07)
Basophils Absolute: 0 10*3/uL (ref 0.0–0.1)
Basophils Relative: 0 %
Eosinophils Absolute: 0.1 10*3/uL (ref 0.0–0.5)
Eosinophils Relative: 1 %
HCT: 43 % (ref 36.0–46.0)
Hemoglobin: 13.4 g/dL (ref 12.0–15.0)
Immature Granulocytes: 1 %
Lymphocytes Relative: 15 %
Lymphs Abs: 1.4 10*3/uL (ref 0.7–4.0)
MCH: 26.9 pg (ref 26.0–34.0)
MCHC: 31.2 g/dL (ref 30.0–36.0)
MCV: 86.3 fL (ref 80.0–100.0)
Monocytes Absolute: 0.4 10*3/uL (ref 0.1–1.0)
Monocytes Relative: 4 %
Neutro Abs: 7 10*3/uL (ref 1.7–7.7)
Neutrophils Relative %: 79 %
Platelets: 185 10*3/uL (ref 150–400)
RBC: 4.98 MIL/uL (ref 3.87–5.11)
RDW: 15.6 % — ABNORMAL HIGH (ref 11.5–15.5)
WBC: 8.9 10*3/uL (ref 4.0–10.5)
nRBC: 0 % (ref 0.0–0.2)

## 2024-03-07 LAB — LIPASE, BLOOD: Lipase: 26 U/L (ref 11–51)

## 2024-03-07 MED ORDER — ACETAMINOPHEN 500 MG PO TABS
1000.0000 mg | ORAL_TABLET | Freq: Once | ORAL | Status: AC
  Administered 2024-03-07: 1000 mg via ORAL
  Filled 2024-03-07: qty 2

## 2024-03-07 MED ORDER — ONDANSETRON 4 MG PO TBDP: 4.0000 mg | ORAL_TABLET | Freq: Three times a day (TID) | ORAL | 0 refills | Status: DC | PRN

## 2024-03-07 MED ORDER — DICYCLOMINE HCL 20 MG PO TABS: 20.0000 mg | ORAL_TABLET | Freq: Two times a day (BID) | ORAL | 0 refills | Status: AC

## 2024-03-07 MED ORDER — SODIUM CHLORIDE 0.9 % IV BOLUS
1000.0000 mL | Freq: Once | INTRAVENOUS | Status: AC
  Administered 2024-03-07: 1000 mL via INTRAVENOUS

## 2024-03-07 MED ORDER — ONDANSETRON HCL 4 MG/2ML IJ SOLN
4.0000 mg | Freq: Once | INTRAMUSCULAR | Status: AC
  Administered 2024-03-07: 4 mg via INTRAVENOUS
  Filled 2024-03-07: qty 2

## 2024-03-07 MED ORDER — IOHEXOL 350 MG/ML SOLN
65.0000 mL | Freq: Once | INTRAVENOUS | Status: AC | PRN
Start: 2024-03-07 — End: 2024-03-07
  Administered 2024-03-07: 65 mL via INTRAVENOUS

## 2024-03-07 NOTE — ED Notes (Signed)
 Patient transported to CT

## 2024-03-07 NOTE — ED Notes (Signed)
 Pt unable to void at this time.  KM

## 2024-03-07 NOTE — ED Triage Notes (Signed)
 Arrives [facility] from home with excessive vomiting. Reported ~20 times.   Upon paramedic arrival they report gcs-8 which improved with 500cc LR.

## 2024-03-07 NOTE — Discharge Instructions (Addendum)
 It was a pleasure taking care of you here in the emergency department  I written you for a few medications to help with your symptoms  Bland diet over the next few days  Follow-up outpatient, return for new or worsening symptoms

## 2024-03-07 NOTE — ED Provider Notes (Signed)
 [place] [facility] EMERGENCY DEPARTMENT AT [facility] Provider Note   CSN: [id number] Arrival date & time: 03/07/24  1910   Patient presents with: Vomiting  Katherine [name] is a 53 y.o. female here for evaluation of emesis. 430 PM multiple episodes of NBNB emesis. Approx> 20 times. Started after eating a BBQ sandwich.  Some generalized abdominal pain worse to left mid and lower abdomen.  Multiple episodes of loose stool without any bright red blood or melena.  No headache, chest pain, shortness of breath.  No sick contacts that she is aware of.  No recent travel or antibiotics.  No history of similar.  She does smoke marijuana.    HPI     Prior to Admission medications   Medication Sig Start Date End Date Taking? Authorizing Provider  dicyclomine  (BENTYL ) 20 MG tablet Take 1 tablet (20 mg total) by mouth 2 (two) times daily. 03/07/24  Yes Orestes Geiman A, PA-C  ondansetron  (ZOFRAN -ODT) 4 MG disintegrating tablet Take 1 tablet (4 mg total) by mouth every 8 (eight) hours as needed. 03/07/24  Yes Koriana Stepien A, PA-C  albuterol  (PROVENTIL  HFA;VENTOLIN  HFA) 108 (90 Base) MCG/ACT inhaler Inhale 2 puffs into the lungs every 6 (six) hours as needed for wheezing or shortness of breath.    [provider]  cetirizine  (ZYRTEC ) 10 MG tablet Take 10 mg by mouth daily. 10/06/22   [provider]  DULoxetine  (CYMBALTA ) 30 MG capsule Take 30 mg by mouth daily.    [provider]  fluticasone  (FLONASE ) 50 MCG/ACT nasal spray Place 1 spray into both nostrils as needed for allergies or rhinitis.    [provider]  hydrocortisone  cream 1 % Apply to affected area 2 times daily 02/17/21   Couture, Cortni S, PA-C  lidocaine  (LIDODERM ) 5 % Place 1 patch onto the skin daily. Remove & Discard patch within 12 hours or as directed by MD 01/21/24   Zelaya, Oscar A, PA-C  metoprolol  succinate (TOPROL -XL) 25 MG 24 hr tablet Take 1 tablet (25 mg total) by mouth 2 (two) times daily.  This is your heart medication, it helps control your heart rate. 08/06/23   Ursuy, Renee Lynn, PA-C  metroNIDAZOLE  (FLAGYL ) 500 MG tablet Take 1 tablet (500 mg total) by mouth 2 (two) times daily. 01/16/22   Ward, Char Common, PA-C  omeprazole  (PRILOSEC) 40 MG capsule Take 40 mg by mouth every morning. 11/10/22   [provider]  oxyCODONE -acetaminophen  (PERCOCET/ROXICET) 5-325 MG tablet Take 1 tablet by mouth every 6 (six) hours as needed for severe pain (pain score 7-10). 01/21/24   Zelaya, Oscar A, PA-C  RESTASIS 0.05 % ophthalmic emulsion Place 1 drop into both eyes 2 (two) times daily. 08/16/22   [provider]  SYMBICORT 80-4.5 MCG/ACT inhaler Inhale 2 puffs into the lungs 3 (three) times daily. 11/14/19   [provider]    Allergies: Shellfish allergy , Tea, and Tomato    Review of Systems  Constitutional: Negative.   HENT: Negative.    Respiratory: Negative.    Cardiovascular: Negative.   Gastrointestinal:  Positive for diarrhea, nausea and vomiting. Negative for abdominal distention and rectal pain.  Genitourinary: Negative.   Musculoskeletal: Negative.   Skin: Negative.   Neurological: Negative.   All other systems reviewed and are negative.  Updated Vital Signs BP (!) 154/107 (BP Location: Right Arm)   Pulse (!) 58   Temp 97.6 F (36.4 C) (Oral)   Resp 14   Ht 5' 5 (1.651  m)   Wt 68 kg   LMP 08/17/2016 (Approximate)   SpO2 100%   BMI 24.96 kg/m   Physical Exam Vitals and nursing note reviewed.  Constitutional:      General: She is not in acute distress.    Appearance: She is well-developed. She is not ill-appearing.  HENT:     Head: Atraumatic.   Eyes:     Pupils: Pupils are equal, round, and reactive to light.    Cardiovascular:     Rate and Rhythm: Normal rate.  Pulmonary:     Effort: No respiratory distress.  Abdominal:     General: Bowel sounds are normal. There is no distension.     Palpations: Abdomen is soft.      Tenderness: There is abdominal tenderness. There is no right CVA tenderness, left CVA tenderness or guarding.   Musculoskeletal:        General: Normal range of motion.     Cervical back: Normal range of motion.   Skin:    General: Skin is warm and dry.   Neurological:     General: No focal deficit present.     Mental Status: She is alert.   Psychiatric:        Mood and Affect: Mood normal.     (all labs ordered are listed, but only abnormal results are displayed) Labs Reviewed  COMPREHENSIVE METABOLIC PANEL WITH GFR - Abnormal; Notable for the following components:      Result Value   Glucose, Bld 105 (*)    All other components within normal limits  URINALYSIS, ROUTINE W REFLEX MICROSCOPIC - Abnormal; Notable for the following components:   APPearance HAZY (*)    Ketones, ur 5 (*)    All other components within normal limits  CBC WITH DIFFERENTIAL/PLATELET - Abnormal; Notable for the following components:   RDW 15.6 (*)    Abs Immature Granulocytes 0.11 (*)    All other components within normal limits  LIPASE, BLOOD    EKG: None  Radiology: CT ABDOMEN PELVIS W CONTRAST Result Date: 03/07/2024 CLINICAL DATA:  Left lower quadrant pain EXAM: CT ABDOMEN AND PELVIS WITH CONTRAST TECHNIQUE: Multidetector CT imaging of the abdomen and pelvis was performed using the standard protocol following bolus administration of intravenous contrast. RADIATION DOSE REDUCTION: This exam was performed according to the departmental dose-optimization program which includes automated exposure control, adjustment of the mA and/or kV according to patient size and/or use of iterative reconstruction technique. CONTRAST:  65mL OMNIPAQUE  IOHEXOL  350 MG/ML SOLN COMPARISON:  Mammogram 03/07/2023, CT 08/23/2022 FINDINGS: Lower chest: Lung bases demonstrate no acute airspace disease. Right mastectomy. Dense breast tissue on the left, correlate with recent mammography Hepatobiliary: No focal liver abnormality  is seen. No gallstones, gallbladder wall thickening, or biliary dilatation. Pancreas: Unremarkable. No pancreatic ductal dilatation or surrounding inflammatory changes. Spleen: Normal in size without focal abnormality. Adrenals/Urinary Tract: Adrenal glands are unremarkable. Kidneys are normal, without renal calculi, focal lesion, or hydronephrosis. Bladder is unremarkable. Stomach/Bowel: Stomach is within normal limits. No evidence of bowel wall thickening, distention, or inflammatory changes. Vascular/Lymphatic: No significant vascular findings are present. No enlarged abdominal or pelvic lymph nodes. Reproductive: Status post hysterectomy. Left adnexal cyst measuring up to 2.8 cm, right adnexal cyst measuring up to 17 mm. No follow-up imaging recommended. Note: This recommendation does not apply to premenarchal patients and to those with increased risk (genetic, family history, elevated tumor markers or other high-risk factors) of ovarian cancer. Reference: JACR 2020 Feb; 17(2):248-254 Other:  Negative for pelvic effusion or free air Musculoskeletal: Superior endplate fracture at L1 as seen on previous spine CT from April post IMPRESSION: 1. No CT evidence for acute intra-abdominal or pelvic abnormality. 2. Superior endplate fracture at L1 as seen on previous spine CT from April. Electronically Signed   By: Esmeralda Hedge M.D.   On: 03/07/2024 22:07     Procedures   Medications Ordered in the ED  sodium chloride  0.9 % bolus 1,000 mL (1,000 mLs Intravenous New Bag/Given 03/07/24 2107)  ondansetron  (ZOFRAN ) injection 4 mg (4 mg Intravenous Given 03/07/24 2105)  iohexol  (OMNIPAQUE ) 350 MG/ML injection 65 mL (65 mLs Intravenous Contrast Given 03/07/24 2153)  acetaminophen  (TYLENOL ) tablet 1,000 mg (1,000 mg Oral Given 03/07/24 4998)   53 year old here for evaluation of nausea, vomiting and diarrhea.  Began earlier today.  Multiple episodes of NBNB emesis as well as loose stool.  There is antibiotics for travel.   On arrival patient is afebrile, nonseptic, non-ill-appearing.  She is some tenderness to her left side of her abdomen.  No sick contacts.  Plan for labs, imaging and reassess  Labs and imaging personally viewed and interpreted:  CBC without leukocytosis Metabolic panel without significant abnormality UA negative for infection CT on pelvis without acute abnormality  Patient reassessed.  Pain controlled.  Tolerating p.o. intake.  She has been here for 4 hours without any nausea or vomiting.  Will have follow-up outpatient, return for new or worsening symptoms.  Patient is nontoxic, nonseptic appearing, in no apparent distress.  Patient's pain and other symptoms adequately managed in emergency department.  Fluid bolus given.  Labs, imaging and vitals reviewed.  Patient does not meet the SIRS or Sepsis criteria.  On repeat exam patient does not have a surgical abdomin and there are no peritoneal signs.  No indication of appendicitis, bowel obstruction, bowel perforation, cholecystitis, diverticulitis, intracranial abnormality.  Patient discharged home with symptomatic treatment and given strict instructions for follow-up with their primary care physician.  I have also discussed reasons to return immediately to the ER.  Patient expresses understanding and agrees with plan.                                  Medical Decision Making Amount and/or Complexity of Data Reviewed Independent Historian: spouse External Data Reviewed: labs, radiology and notes. Labs: ordered. Decision-making details documented in ED Course. Radiology: ordered and independent interpretation performed. Decision-making details documented in ED Course.  Risk OTC drugs. Prescription drug management. Parenteral controlled substances. Decision regarding hospitalization. Diagnosis or treatment significantly limited by social determinants of health.      Final diagnoses:  Nausea vomiting and diarrhea    ED Discharge  Orders          Ordered    ondansetron  (ZOFRAN -ODT) 4 MG disintegrating tablet  Every 8 hours PRN        03/07/24 2309    dicyclomine  (BENTYL ) 20 MG tablet  2 times daily        03/07/24 2309               Shikha Bibb A, PA-C 03/07/24 2321    Tonya Fredrickson, MD 03/08/24 1053    Tonya Fredrickson, MD 03/12/24 1101

## 2024-03-07 NOTE — ED Notes (Signed)
Water given to patient for PO challenge

## 2024-04-17 ENCOUNTER — Ambulatory Visit: Admitting: Physical Medicine and Rehabilitation

## 2024-07-10 NOTE — Progress Notes (Signed)
 Subjective   Katherine [name] is a 53 year old female here for Menopausal Symptoms (Patient is here to be seen for menopause, she is having hot flashes during the night with sweating, dry mouth, hair thinning , She would like a refill on all of her medications. She would like to discuss getting a medication for hot flashes. Flu vaccine has been given. )  C/O hot flashes for years, since age 77,s/pm hysterectomy. Increased HR with hot flashes.  Very distressing. Diff sleeping.  CT scan 02/2024  Reproductive: Status post hysterectomy. Left adnexal cyst measuring  up to 2.8 cm, right adnexal cyst measuring up to 17 mm. No follow-up  imaging recommended.    Documentation Reviewed by Any User: Tobacco  Allergies  Meds  Problems   Med Hx  Surg Hx  Fam Hx     Current Outpatient Medications  Medication Sig Dispense Refill  . cloNIDine (CATAPRES) 0.1 mg tablet Take 1 Tablet by mouth nightly at bedtime as needed (rest) for up to 60 days. 30 Tablet 1  . methocarbamoL  (ROBAXIN ) 500 mg tablet TAKE 1 TABLET BY MOUTH FOUR TIMES A DAY (Patient not taking: Reported on 07/10/2024.) 60 Tablet 0  . ibuprofen  600 mg tablet TAKE 1 TABLET BY MOUTH 4 (FOUR) TIMES DAILY AS NEEDED FOR PAIN ALWAYS TAKE WITH FOOD.SABRA 90 Tablet 0  . albuterol  HFA (VENTOLIN  HFA) 90 mcg/actuation inhaler INHALE 2 PUFFS BY MOUTH EVERY 4 HOURS AS NEEDED FOR WHEEZE OR FOR SHORTNESS OF BREATH 18 Each 1  . oxyCODONE -acetaminophen  (PERCOCET) 5-325 mg per tablet Take 1 Tablet by mouth every 6 (six) hours as needed for pain Take if pain severity greater than 8 out of 10.. Max Daily Amount: 4 Tablets 15 Tablet 0  . HYDROcodone -acetaminophen  (NORCO) 5-325 mg per tablet Take 1 Tablet by mouth every 4 to 6 (four to six) hours as needed for pain. Max Daily Amount: 6 Tablets 20 Tablet 0  . lidocaine  (LIDODERM ) 5 % patch Place 1 Patch onto the skin.    . budesonide-formoteroL (SYMBICORT) 80-4.5 mcg/actuation inhaler Inhale 2 Puffs into the lungs 2 (two) times  daily 10.2 Each 1  . cetirizine  (ZYRTEC ) 10 mg tablet Take 1 Tablet by mouth once daily 90 Tablet 3  . dicyclomine  (BENTYL ) 20 mg tablet Take 1 Tablet by mouth 2 (two) times daily 180 Tablet 1  . DULoxetine  (CYMBALTA ) 30 mg DR capsule Take 1 Capsule by mouth once daily 90 Capsule 1  . fluticasone  (FLONASE ) 50 mcg/actuation nasal spray Place 2 Sprays in both nostrils daily 48 mL 1  . hydrocortisone  2.5 % cream Apply topically 2 (two) times daily 30 g 2  . ketoconazole (NIZORAL) 2 % shampoo APPLY TOPICALLY TO SCALP ONCE DAILY 3 TIMES PER WEEK AS NEEDED 120 mL 1  . meloxicam (MOBIC) 7.5 mg tablet Take 2 Tablets by mouth once daily (Patient not taking: Reported on 07/10/2024.) 180 Tablet 1  . metoprolol  succinate XL (TOPROL -XL) 25 mg 24 hr tablet Take 1 Tablet by mouth once daily 90 Tablet 1  . omeprazole  (PRILOSEC) 40 mg DR capsule Take 1 Capsule by mouth every morning before breakfast 90 Capsule 1  . sucralfate  (CARAFATE ) 1 gram tablet Take 1 Tablet by mouth 2 (two) times daily 180 Tablet 1  . ondansetron  ODT (ZOFRAN -ODT) 4 mg disintegrating tablet Take 1 Tablet by mouth 2 (two) times daily as needed for nausea 90 Tablet 1  . fluorometholone (FML) 0.1 % drps USE AS DIRECTED 1 DROP INTO BOTH EYES 4 TIMES  A DAY FOR 2 WEEKS, THEN 1 DROP IN BOTH EYES 2 TIMES A DAY FOR 2 WEEKS    . RESTASIS 0.05 % ophthalmic emulsion INSTILL 1 DROP INTO EACH EYE TWICE DAILY     Review of Systems   Objective   BP 133/83  Pulse 56  Temp 98.3 F (36.8 C)  Ht 5' 6 (1.676 m)  Wt 148 lb (67.1 kg)  LMP  (LMP Unknown)  SpO2 98%  BMI 23.89 kg/m  OB Status Hysterectomy  Smoking Status Some Days  BSA 1.77 m  Last 3 Vitals    Flowsheet Row Office Visit from 07/10/2024 in [place] at [facility] Office Visit from 02/13/2024 in [place] at [facility] Office Visit from 01/09/2024 in [facility] at [facility]  Temp 98.3 F (36.8 C) 99.3 F (37.4 C) 97.9 F (36.6 C)  Pulse 56 61 56  BP  133/83 134/87 121/80  Resp -- 16 16  Weight 148 lb (67.1 kg) 155 lb 6.4 oz (70.5 kg) 150 lb 6.4 oz (68.2 kg)    Physical Exam  Constitutional:      Appearance: Normal appearance.  HENT:     Head: Normocephalic and atraumatic.   Musculoskeletal:        General: Normal range of motion.     Assessment and Plan   1. Hot flashes due to menopause Comments: Will try cloni - cloNIDine (CATAPRES) 0.1 mg tablet; Take 1 Tablet by mouth nightly at bedtime as needed (rest) for up to 60 days.  Dispense: 30 Tablet; Refill: 1   She has tachycardia with flushing and diff sleeping so will choose clonidine.  Pt warned about dry mouth and possible dizziness.

## 2024-07-28 ENCOUNTER — Encounter: Payer: Self-pay | Admitting: Radiology

## 2024-08-11 NOTE — Progress Notes (Addendum)
 Subjective   Katherine [name] is a 53 year old female here for Follow Up (Pt in for f/u. She wants stronger pain meds/acid reflux meds. She says Ibuprofen  and Meloxicam has recent started making her feel sick, so she doesn't take them.), Nausea (She mentions nausea over the weekend, occurred again this morning. No vomiting just nausea. Zofran  isnt working), and Back Pain (Also back pain for a while. Wants stronger pain meds.)  Needs refills.   Documentation Reviewed by Any User: Tobacco  Allergies  Meds  Med Hx   Surg Hx  Fam Hx  Soc Hx    Current Outpatient Medications  Medication Sig Dispense Refill  . cloNIDine (CATAPRES) 0.1 mg tablet Take 1 Tablet by mouth nightly at bedtime as needed (rest) for up to 90 days. 90 Tablet 1  . methocarbamoL  (ROBAXIN ) 500 mg tablet TAKE 1 TABLET BY MOUTH FOUR TIMES A DAY 60 Tablet 0  . ibuprofen  600 mg tablet TAKE 1 TABLET BY MOUTH 4 (FOUR) TIMES DAILY AS NEEDED FOR PAIN ALWAYS TAKE WITH FOOD.. (Patient not taking: Reported on 08/11/2024.) 90 Tablet 0  . albuterol  HFA (VENTOLIN  HFA) 90 mcg/actuation inhaler INHALE 2 PUFFS BY MOUTH EVERY 4 HOURS AS NEEDED FOR WHEEZE OR FOR SHORTNESS OF BREATH 18 Each 1  . oxyCODONE -acetaminophen  (PERCOCET) 5-325 mg per tablet Take 1 Tablet by mouth every 6 (six) hours as needed for pain Take if pain severity greater than 8 out of 10.. Max Daily Amount: 4 Tablets (Patient not taking: Reported on 08/11/2024.) 15 Tablet 0  . HYDROcodone -acetaminophen  (NORCO) 5-325 mg per tablet Take 1 Tablet by mouth every 4 to 6 (four to six) hours as needed for pain. Max Daily Amount: 6 Tablets 20 Tablet 0  . lidocaine  (LIDODERM ) 5 % patch Place 1 Patch onto the skin.    . budesonide-formoteroL (SYMBICORT) 80-4.5 mcg/actuation inhaler Inhale 2 Puffs into the lungs 2 (two) times daily 10.2 Each 1  . cetirizine  (ZYRTEC ) 10 mg tablet Take 1 Tablet by mouth once daily 90 Tablet 3  . dicyclomine  (BENTYL ) 20 mg tablet Take 1 Tablet by mouth 2 (two) times  daily 180 Tablet 1  . DULoxetine  (CYMBALTA ) 30 mg DR capsule Take 1 Capsule by mouth once daily 90 Capsule 1  . fluticasone  (FLONASE ) 50 mcg/actuation nasal spray Place 2 Sprays in both nostrils daily 48 mL 1  . hydrocortisone  2.5 % cream Apply topically 2 (two) times daily 30 g 2  . ketoconazole (NIZORAL) 2 % shampoo APPLY TOPICALLY TO SCALP ONCE DAILY 3 TIMES PER WEEK AS NEEDED 120 mL 1  . meloxicam (MOBIC) 7.5 mg tablet Take 2 Tablets by mouth once daily (Patient not taking: Reported on 08/11/2024) 180 Tablet 1  . metoprolol  succinate XL (TOPROL -XL) 25 mg 24 hr tablet Take 1 Tablet by mouth once daily 90 Tablet 1  . omeprazole  (PRILOSEC) 40 mg DR capsule Take 1 Capsule by mouth every morning before breakfast 90 Capsule 1  . sucralfate  (CARAFATE ) 1 gram tablet Take 1 Tablet by mouth 2 (two) times daily 180 Tablet 1  . ondansetron  ODT (ZOFRAN -ODT) 4 mg disintegrating tablet Take 1 Tablet by mouth 2 (two) times daily as needed for nausea (Patient not taking: Reported on 08/11/2024.) 90 Tablet 1  . fluorometholone (FML) 0.1 % drps USE AS DIRECTED 1 DROP INTO BOTH EYES 4 TIMES A DAY FOR 2 WEEKS, THEN 1 DROP IN BOTH EYES 2 TIMES A DAY FOR 2 WEEKS    . RESTASIS 0.05 % ophthalmic emulsion INSTILL  1 DROP INTO EACH EYE TWICE DAILY       Objective   BP (!) 134/90  Pulse 65  Temp 98.4 F (36.9 C)  Ht 5' 6 (1.676 m)  Wt 150 lb 3.2 oz (68.1 kg)  LMP  (LMP Unknown)  SpO2 99%  BMI 24.24 kg/m  OB Status Hysterectomy  Smoking Status Some Days  BSA 1.78 m  Physical Exam  No active muscle spasms   Assessment and Plan    1. Primary hypertension - metoprolol  succinate XL (TOPROL -XL) 25 mg 24 hr tablet; Take 1 Tablet by mouth once daily.  Dispense: 90 Tablet; Refill: 1  2. Closed compression fracture of body of L1 vertebra - methocarbamoL  (ROBAXIN ) 500 mg tablet; Take 1 Tablet by mouth 4 (four) times daily.  Dispense: 60 Tablet; Refill: 0  3. Gastroesophageal reflux disease without  esophagitis - omeprazole  (PRILOSEC) 40 mg DR capsule; Take 1 Capsule by mouth every morning before breakfast.  Dispense: 90 Capsule; Refill: 1 - sucralfate  (CARAFATE ) 1 gram tablet; Take 1 Tablet by mouth 2 (two) times daily.  Dispense: 180 Tablet; Refill: 1  4. Degeneration of intervertebral disc of lumbar region with discogenic back pain  5. Adnexal cyst Comments: bilat by CT  6. Mild intermittent asthma without complication - budesonide-formoteroL (SYMBICORT) 80-4.5 mcg/actuation inhaler; Inhale 2 Puffs into the lungs 2 (two) times daily.  Dispense: 10.2 Each; Refill: 1  7. Seasonal allergies - cetirizine  (ZYRTEC ) 10 mg tablet; Take 1 Tablet by mouth once daily.  Dispense: 90 Tablet; Refill: 3 - fluticasone  (FLONASE ) 50 mcg/actuation nasal spray; Place 2 Sprays in both nostrils daily.  Dispense: 48 mL; Refill: 1  8. Hot flashes due to menopause Comments: Will try cloni - cloNIDine (CATAPRES) 0.1 mg tablet; Take 1 Tablet by mouth nightly at bedtime as needed (rest) for up to 90 days.  Dispense: 90 Tablet; Refill: 1  9. Dry eyes - RESTASIS 0.05 % ophthalmic emulsion; Place 1 Drop into both eyes every 12 (twelve) hours for 120 days.  Dispense: 3 mL; Refill: 3  10. Dermatitis - ketoconazole (NIZORAL) 2 % shampoo; APPLY TOPICALLY TO SCALP ONCE DAILY 3 TIMES PER WEEK AS NEEDED.  Dispense: 120 mL; Refill: 1  11. Screening due - Cologuard colon cancer screening Stool Stool Routine  12. Immunization due - HEP B ADULT

## 2024-08-17 ENCOUNTER — Ambulatory Visit (HOSPITAL_COMMUNITY)
Admission: EM | Admit: 2024-08-17 | Discharge: 2024-08-17 | Disposition: A | Discharge status: Home / Self Care | Attending: Nurse Practitioner | Admitting: Nurse Practitioner

## 2024-08-17 ENCOUNTER — Ambulatory Visit (INDEPENDENT_AMBULATORY_CARE_PROVIDER_SITE_OTHER)

## 2024-08-17 ENCOUNTER — Encounter (HOSPITAL_COMMUNITY): Payer: Self-pay | Admitting: Emergency Medicine

## 2024-08-17 DIAGNOSIS — R110 Nausea: Secondary | ICD-10-CM | POA: Diagnosis not present

## 2024-08-17 DIAGNOSIS — R1032 Left lower quadrant pain: Secondary | ICD-10-CM

## 2024-08-17 DIAGNOSIS — K5900 Constipation, unspecified: Secondary | ICD-10-CM

## 2024-08-17 DIAGNOSIS — K219 Gastro-esophageal reflux disease without esophagitis: Secondary | ICD-10-CM

## 2024-08-17 LAB — POCT URINE DIPSTICK
Bilirubin, UA: NEGATIVE
Glucose, UA: NEGATIVE mg/dL
Ketones, POC UA: NEGATIVE mg/dL
Leukocytes, UA: NEGATIVE
Nitrite, UA: NEGATIVE
POC PROTEIN,UA: NEGATIVE
Spec Grav, UA: 1.02 (ref 1.010–1.025)
Urobilinogen, UA: 0.2 U/dL
pH, UA: 7 (ref 5.0–8.0)

## 2024-08-17 MED ORDER — LIDOCAINE VISCOUS HCL 2 % MT SOLN
OROMUCOSAL | Status: AC
  Filled 2024-08-17: qty 15

## 2024-08-17 MED ORDER — ALUM & MAG HYDROXIDE-SIMETH 200-200-20 MG/5ML PO SUSP
30.0000 mL | Freq: Once | ORAL | Status: AC
  Administered 2024-08-17: 30 mL via ORAL

## 2024-08-17 MED ORDER — POLYETHYLENE GLYCOL 3350 17 GM/SCOOP PO POWD: 34.0000 g | Freq: Two times a day (BID) | ORAL | 0 refills | Status: AC

## 2024-08-17 MED ORDER — SENNOSIDES-DOCUSATE SODIUM 8.6-50 MG PO TABS: 1.0000 | ORAL_TABLET | Freq: Two times a day (BID) | ORAL | 0 refills | Status: AC

## 2024-08-17 MED ORDER — ONDANSETRON 4 MG PO TBDP
4.0000 mg | ORAL_TABLET | Freq: Once | ORAL | Status: AC
  Administered 2024-08-17: 4 mg via ORAL

## 2024-08-17 MED ORDER — LIDOCAINE VISCOUS HCL 2 % MT SOLN
15.0000 mL | Freq: Once | OROMUCOSAL | Status: AC
  Administered 2024-08-17: 15 mL via OROMUCOSAL

## 2024-08-17 MED ORDER — PROMETHAZINE HCL 25 MG PO TABS: 25.0000 mg | ORAL_TABLET | Freq: Four times a day (QID) | ORAL | 0 refills | Status: AC | PRN

## 2024-08-17 MED ORDER — KETOROLAC TROMETHAMINE 60 MG/2ML IM SOLN
INTRAMUSCULAR | Status: AC
Start: 2024-08-17 — End: 2024-08-17
  Filled 2024-08-17: qty 2

## 2024-08-17 MED ORDER — KETOROLAC TROMETHAMINE 60 MG/2ML IM SOLN
60.0000 mg | Freq: Once | INTRAMUSCULAR | Status: AC
  Administered 2024-08-17: 60 mg via INTRAMUSCULAR

## 2024-08-17 MED ORDER — ALUM & MAG HYDROXIDE-SIMETH 200-200-20 MG/5ML PO SUSP
ORAL | Status: AC
  Filled 2024-08-17: qty 30

## 2024-08-17 MED ORDER — ONDANSETRON 4 MG PO TBDP
ORAL_TABLET | ORAL | Status: AC
  Filled 2024-08-17: qty 1

## 2024-08-17 NOTE — Discharge Instructions (Addendum)
 You were seen today for left lower abdominal pain, nausea, constipation and symptoms of acid reflux. Your urine test did not show any signs of infection.  The XR of your abdomen suggests constipation with a moderate amount of stool in your colon and rectum. You have been prescribed a course of medications to allow you to move your bowels comfortably. Take the medications exactly as prescribed.  Make sure you drink plenty of clear liquids and eat a bland diet while on this regimen. Examples of bland foods would be bananas, plain white rice, applesauce and dry toast. More information about bland diet is attached.  Avoid any dairy, thick fruit juices, juices with pulp, fried or greasy foods until your bowels are normal.  Once you start having a normal bowel movement, be sure that you drink plenty of fluids to stay hydrated and consume a diet with adequate fiber to keep your bowels regular.  Go to the ED if you develop severe abdominal pain, abdominal bloating, vomiting, fevers or any other concerns. Follow-up with your primary care provider or GI specialist if you continue to have issues with constipation or anything.

## 2024-08-17 NOTE — ED Provider Notes (Signed)
 [facility]    CSN: [id number] Arrival date & time: 08/17/24  9187      History   Chief Complaint Chief Complaint  Patient presents with   Abdominal Pain    HPI Katherine [name] is a 53 y.o. female.   Discussed the use of AI scribe software for clinical note transcription with the patient, who gave verbal consent to proceed.   Patient presents with left-sided abdominal pain and constipation. She reports constant left lower quadrant abdominal pain rated 10/10, which began as intermittent but became persistent last night. She describes pressure on the left side accompanied by significant straining with bowel movements. She reports chronic constipation with hard stools; her last *normal* bowel movement was approximately one month ago, though she continues to have daily bowel movements that are firm. She took a stool softener two weeks ago and had her most recent bowel movement last night. She notes rectal pain following defecation but denies hematochezia. Associated symptoms include nausea without vomiting. She denies dysuria but reports intermittent sharp pains localized to the left lower abdomen. She reports adequate fluid intake but decreased appetite. She has not tried additional therapies due to her current medications.  She was evaluated by her PCP on 08/11/2024 and reports that she discussed the abdominal pain and constipation issues with him at that time. She was told that her pain may be due to her ovarian cyst and acid reflux. A CT abdomen/pelvis performed in June showed bilateral adnexal cysts. Past medical history includes acid reflux managed with chronic omeprazole  and sucralfate , and a chronic L1 compression fracture for which Robaxin  was prescribed four times daily. She has a history of opioid use for pain control but is no longer prescribed opioids.   The following sections of the patient's history were reviewed and updated as appropriate: allergies, current medications,  past family history, past medical history, past social history, past surgical history, and problem list.        Past Medical History:  Diagnosis Date   Anxiety    Asthma    Breast cancer (HCC)    Dysrhythmia    History of SVT-No medications   Fibromyalgia    GERD (gastroesophageal reflux disease)    History of radiation therapy 12/19/17- 01/15/18   Right Breast, 2.67 Gy in 15 fractions for a total dose of 40.05 Gy. 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   right breast   Migraine    Palpitations    Pneumonia    with cavitation of left lower lobe   SVT (supraventricular tachycardia)    Zio Monitor 11/2019: Patient only wore for 3 day (adhesive allergy ); NSR, Avg HR 65, no arrhythmia   Tuberculosis    exposure as a child, get checked frequently   Uterine fibroid     Patient Active Problem List   Diagnosis Date Noted   Chemotherapy-induced peripheral neuropathy 12/17/2017   Port-A-Cath in place 09/24/2017   Malignant neoplasm of upper-outer quadrant of right breast in female, estrogen receptor negative (HCC) 07/03/2017   Pelvic pain in female 12/21/2016   Fibroids, intramural 12/21/2016   Post-operative state 12/19/2016   Nocturia 08/09/2015   Fibromyalgia 06/25/2015   GERD (gastroesophageal reflux disease) 06/16/2013   Migraine 06/16/2013   Atrial tachycardia, paroxysmal 03/31/2013   Headache 12/20/2012   Syncope 06/20/2012   Fibroids 03/26/2012   Family  history of breast cancer 01/22/2012   Seasonal and perennial allergic rhinitis 07/18/2010   Alopecia 07/04/2010   ANXIETY DISORDER, SITUATIONAL, MILD 11/17/2009   Palpitations 10/12/2009   CHEST PAIN, LEFT 10/12/2009   DYSURIA 08/26/2009   Personal history presenting hazards to health 04/09/2009   TOBACCO ABUSE 03/17/2009   Hearing loss 10/27/2008   BACK PAIN, LUMBAR 06/06/2007   HOT FLASHES 07/18/2006     Past Surgical History:  Procedure Laterality Date   BREAST LUMPECTOMY Right 07/24/2017   BREAST LUMPECTOMY WITH RADIOACTIVE SEED AND SENTINEL LYMPH NODE BIOPSY Right 07/24/2017   Procedure: RIGHT BREAST LUMPECTOMY WITH RADIOACTIVE SEED AND SENTINEL LYMPH NODE BIOPSY;  Surgeon: Vanderbilt Ned, MD;  Location: [facility];  Service: General;  Laterality: Right;   PORT-A-CATH REMOVAL Right 06/18/2019   Procedure: REMOVAL PORT-A-CATH;  Surgeon: Vanderbilt Ned, MD;  Location: [facility];  Service: General;  Laterality: Right;   PORTACATH PLACEMENT Right 07/24/2017   Procedure: INSERTION PORT-A-CATH;  Surgeon: Vanderbilt Ned, MD;  Location: [facility];  Service: General;  Laterality: Right;   TUBAL LIGATION  1993   VAGINAL HYSTERECTOMY Bilateral 12/19/2016   Procedure: HYSTERECTOMY VAGINAL;  Surgeon: Elveria Mungo, MD;  Location: [facility];  Service: Gynecology;  Laterality: Bilateral;   WISDOM TOOTH EXTRACTION      OB History     Gravida  2   Para  2   Term  1   Preterm  1   AB      Living  2      SAB      IAB      Ectopic      Multiple      Live Births               Home Medications    Prior to Admission medications   Medication Sig Start Date End Date Taking? Authorizing Provider  albuterol  (PROVENTIL  HFA;VENTOLIN  HFA) 108 (90 Base) MCG/ACT inhaler Inhale 2 puffs into the lungs every 6 (six) hours as needed for wheezing or shortness of breath.    [provider]  cetirizine  (ZYRTEC ) 10 MG tablet Take 10 mg by mouth daily. 10/06/22   [provider]  dicyclomine  (BENTYL ) 20 MG tablet Take 1 tablet (20 mg total) by mouth 2 (two) times daily. 03/07/24   Henderly, Britni A, PA-C  DULoxetine  (CYMBALTA ) 30 MG capsule Take 30 mg by mouth daily.    [provider]  fluticasone  (FLONASE ) 50 MCG/ACT nasal spray Place 1 spray into both nostrils as needed for allergies or rhinitis.    [provider]  hydrocortisone  cream 1 % Apply to affected area 2 times daily 02/17/21   Couture, Cortni S, PA-C  lidocaine  (LIDODERM ) 5 % Place 1 patch onto the skin daily. Remove & Discard patch within 12 hours or as directed by MD 01/21/24   Zelaya, Oscar A, PA-C  metoprolol  succinate (TOPROL -XL) 25 MG 24 hr tablet Take 1 tablet (25 mg total) by mouth 2 (two) times daily. This is your heart medication, it helps control your heart rate. 08/06/23   Ursuy, Renee Lynn, PA-C  metroNIDAZOLE  (FLAGYL ) 500 MG tablet Take 1 tablet (500 mg total) by mouth 2 (two) times daily. 01/16/22   Ward, Harlene PEDLAR, PA-C  omeprazole  (PRILOSEC) 40 MG capsule Take 40 mg by mouth every morning. 11/10/22   [provider]  ondansetron  (ZOFRAN -ODT) 4 MG disintegrating tablet Take 1 tablet (4 mg total) by mouth every 8 (eight)  hours as needed. 03/07/24   Henderly, Britni A, PA-C  oxyCODONE -acetaminophen  (PERCOCET/ROXICET) 5-325 MG tablet Take 1 tablet by mouth every 6 (six) hours as needed for severe pain (pain score 7-10). 01/21/24   Zelaya, Oscar A, PA-C  RESTASIS 0.05 % ophthalmic emulsion Place 1 drop into both eyes 2 (two) times daily. 08/16/22   [provider]  SYMBICORT 80-4.5 MCG/ACT inhaler Inhale 2 puffs into the lungs 3 (three) times daily. 11/14/19   [provider]    Family History Family History  Problem Relation Age of Onset   Migraines Daughter    Hypertension Mother    Heart disease Mother    Hypertension Father    Heart disease Father    Migraines Son    Hypertension Other    Colon cancer Neg Hx    Colon polyps Neg Hx    Diabetes Neg Hx    Kidney disease Neg Hx    Liver disease Neg Hx    Heart attack Neg Hx    Stroke Neg Hx     Social History Social History   Tobacco Use   Smoking status: Former    Current packs/day: 0.00    Types: Cigarettes    Quit date: 07/16/2017    Years since quitting: 7.0   Smokeless tobacco: Never   Tobacco comments:    smokes 1cigarette  per day and more if she is stressed  Vaping Use   Vaping status: Never Used  Substance Use Topics   Alcohol use: No    Alcohol/week: 0.0 standard drinks of alcohol   Drug use: Not Currently    Types: Marijuana     Allergies   Shellfish allergy , Tea, and Tomato   Review of Systems Review of Systems  Constitutional:  Negative for fever.  Gastrointestinal:  Positive for abdominal pain (left side and LLQ), constipation, nausea and rectal pain (after moving bowels due to constipated stools). Negative for anal bleeding, blood in stool, diarrhea and vomiting.       Moving bowels daily but it is hard and requires straining. Last normal stool was about a month ago.   Genitourinary:  Negative for dysuria.  All other systems reviewed and are negative.    Physical Exam Triage Vital Signs ED Triage Vitals  Encounter Vitals Group     BP 08/17/24 0829 129/87     Girls Systolic BP Percentile --      Girls Diastolic BP Percentile --      Boys Systolic BP Percentile --      Boys Diastolic BP Percentile --      Pulse Rate 08/17/24 0829 60     Resp 08/17/24 0829 17     Temp 08/17/24 0829 98.1 F (36.7 C)     Temp Source 08/17/24 0829 Oral     SpO2 08/17/24 0829 98 %     Weight --      Height --      Head Circumference --      Peak Flow --      Pain Score 08/17/24 0828 10     Pain Loc --      Pain Education --      Exclude from Growth Chart --    No data found.  Updated Vital Signs BP 129/87 (BP Location: Left Arm)   Pulse 60   Temp 98.1 F (36.7 C) (Oral)   Resp 17   LMP 08/17/2016 (Approximate)   SpO2 98%   Visual Acuity Right Eye Distance:  Left Eye Distance:   Bilateral Distance:    Right Eye Near:   Left Eye Near:    Bilateral Near:     Physical Exam Vitals reviewed.  Constitutional:      General: She is awake. She is not in acute distress.    Appearance: Normal appearance. She is well-developed. She is not ill-appearing, toxic-appearing or diaphoretic.   HENT:     Head: Normocephalic.     Right Ear: Hearing normal.     Left Ear: Hearing normal.     Nose: Nose normal.     Mouth/Throat:     Mouth: Mucous membranes are moist.  Eyes:     General: Vision grossly intact.     Conjunctiva/sclera: Conjunctivae normal.  Cardiovascular:     Rate and Rhythm: Normal rate and regular rhythm.     Heart sounds: Normal heart sounds.  Pulmonary:     Effort: Pulmonary effort is normal.     Breath sounds: Normal breath sounds and air entry.  Abdominal:     General: Bowel sounds are normal. There is no distension.     Palpations: Abdomen is soft.     Tenderness: There is abdominal tenderness in the left lower quadrant. There is no right CVA tenderness, left CVA tenderness, guarding or rebound.  Musculoskeletal:        General: Normal range of motion.     Cervical back: Normal range of motion and neck supple.  Skin:    General: Skin is warm and dry.  Neurological:     General: No focal deficit present.     Mental Status: She is alert and oriented to person, place, and time.  Psychiatric:        Speech: Speech normal.        Behavior: Behavior is cooperative.      UC Treatments / Results  Labs (all labs ordered are listed, but only abnormal results are displayed) Labs Reviewed  POCT URINE DIPSTICK    EKG   Radiology No results found.  Procedures Procedures (including critical care time)  Medications Ordered in UC Medications  alum & mag hydroxide-simeth (MAALOX/MYLANTA) 200-200-20 MG/5ML suspension 30 mL (30 mLs Oral Given 08/17/24 0906)  lidocaine  (XYLOCAINE ) 2 % viscous mouth solution 15 mL (15 mLs Mouth/Throat Given 08/17/24 0906)  ketorolac  (TORADOL ) injection 60 mg (60 mg Intramuscular Given 08/17/24 0907)  ondansetron  (ZOFRAN -ODT) disintegrating tablet 4 mg (4 mg Oral Given 08/17/24 0907)    Initial Impression / Assessment and Plan / UC Course  I have reviewed the triage vital signs and the nursing notes.  Pertinent  labs & imaging results that were available during my care of the patient were reviewed by me and considered in my medical decision making (see chart for details).     Patient presents with left lower abdominal pain accompanied by nausea, constipation, and symptoms consistent with acid reflux. Urinalysis today showed no evidence of urinary tract infection. Abdominal X-ray demonstrated a moderate stool burden throughout the colon and rectum, consistent with constipation as the likely source of her abdominal discomfort and associated symptoms. She was given a GI cocktail, Zofran  ODT and Toradol  injection in the clinic. Given these findings, treatment was initiated with a bowel regimen to promote comfortable and effective evacuation. Dietary modifications were advised, including increased clear fluid intake and adherence to a bland diet temporarily to reduce gastrointestinal irritation. Patient was counseled to avoid dairy, heavy or greasy foods, and thick or pulpy juices until bowel habits normalize. Once  regular bowel movements resume, she was instructed on the importance of maintaining adequate hydration and incorporating dietary fiber to prevent recurrence of constipation. The patient was provided with strict return precautions for severe abdominal pain, bloating, vomiting, fever, or worsening symptoms, which may indicate complications requiring higher-level care. She was advised to follow up with her primary care provider or a gastroenterologist if symptoms persist despite treatment.  Today's evaluation has revealed no signs of a dangerous process. Discussed diagnosis with patient and/or guardian. Patient and/or guardian aware of their diagnosis, possible red flag symptoms to watch out for and need for close follow up. Patient and/or guardian understands verbal and written discharge instructions. Patient and/or guardian comfortable with plan and disposition.  Patient and/or guardian has a clear mental status  at this time, good insight into illness (after discussion and teaching) and has clear judgment to make decisions regarding their care  Documentation was completed with the aid of voice recognition software. Transcription may contain typographical errors.  Final Clinical Impressions(s) / UC Diagnoses   Final diagnoses:  Left lower quadrant abdominal pain   Discharge Instructions   None    ED Prescriptions   None    PDMP not reviewed this encounter.   Iola Clarion, [place] 08/17/24 [phone]

## 2024-08-17 NOTE — ED Triage Notes (Signed)
 Pt c/o left lateral abd pains for 2 days. Reports some nausea and having to strain to have a BM. Hasn't tried any measures to help with pain

## 2024-09-08 ENCOUNTER — Other Ambulatory Visit: Payer: Self-pay

## 2024-09-10 MED ORDER — METOPROLOL SUCCINATE ER 25 MG PO TB24: 25.0000 mg | ORAL_TABLET | Freq: Two times a day (BID) | ORAL | 0 refills | Status: AC

## 2024-10-09 ENCOUNTER — Emergency Department (HOSPITAL_COMMUNITY)
Admission: EM | Admit: 2024-10-09 | Discharge: 2024-10-09 | Disposition: A | Discharge status: Home / Self Care | Attending: Emergency Medicine | Admitting: Emergency Medicine

## 2024-10-09 ENCOUNTER — Emergency Department (HOSPITAL_COMMUNITY)

## 2024-10-09 ENCOUNTER — Other Ambulatory Visit: Payer: Self-pay

## 2024-10-09 ENCOUNTER — Encounter (HOSPITAL_COMMUNITY): Payer: Self-pay

## 2024-10-09 DIAGNOSIS — J45909 Unspecified asthma, uncomplicated: Secondary | ICD-10-CM | POA: Insufficient documentation

## 2024-10-09 DIAGNOSIS — Z853 Personal history of malignant neoplasm of breast: Secondary | ICD-10-CM | POA: Diagnosis not present

## 2024-10-09 DIAGNOSIS — R1032 Left lower quadrant pain: Secondary | ICD-10-CM | POA: Diagnosis present

## 2024-10-09 DIAGNOSIS — R112 Nausea with vomiting, unspecified: Secondary | ICD-10-CM | POA: Diagnosis not present

## 2024-10-09 LAB — CBC WITH DIFFERENTIAL/PLATELET
Abs Immature Granulocytes: 0.02 K/uL (ref 0.00–0.07)
Basophils Absolute: 0 K/uL (ref 0.0–0.1)
Basophils Relative: 1 %
Eosinophils Absolute: 0.1 K/uL (ref 0.0–0.5)
Eosinophils Relative: 2 %
HCT: 44.5 % (ref 36.0–46.0)
Hemoglobin: 14.5 g/dL (ref 12.0–15.0)
Immature Granulocytes: 0 %
Lymphocytes Relative: 47 %
Lymphs Abs: 3.2 K/uL (ref 0.7–4.0)
MCH: 27.6 pg (ref 26.0–34.0)
MCHC: 32.6 g/dL (ref 30.0–36.0)
MCV: 84.8 fL (ref 80.0–100.0)
Monocytes Absolute: 0.5 K/uL (ref 0.1–1.0)
Monocytes Relative: 7 %
Neutro Abs: 2.9 K/uL (ref 1.7–7.7)
Neutrophils Relative %: 43 %
Platelets: 234 K/uL (ref 150–400)
RBC: 5.25 MIL/uL — ABNORMAL HIGH (ref 3.87–5.11)
RDW: 14.9 % (ref 11.5–15.5)
WBC: 6.7 K/uL (ref 4.0–10.5)
nRBC: 0 % (ref 0.0–0.2)

## 2024-10-09 LAB — COMPREHENSIVE METABOLIC PANEL WITH GFR
ALT: 15 U/L (ref 0–44)
AST: 26 U/L (ref 15–41)
Albumin: 4.5 g/dL (ref 3.5–5.0)
Alkaline Phosphatase: 89 U/L (ref 38–126)
Anion gap: 13 (ref 5–15)
BUN: 10 mg/dL (ref 6–20)
CO2: 22 mmol/L (ref 22–32)
Calcium: 9.5 mg/dL (ref 8.9–10.3)
Chloride: 105 mmol/L (ref 98–111)
Creatinine, Ser: 0.68 mg/dL (ref 0.44–1.00)
GFR, Estimated: >60 mL/min
Glucose, Bld: 87 mg/dL (ref 70–99)
Potassium: 4.7 mmol/L (ref 3.5–5.1)
Sodium: 139 mmol/L (ref 135–145)
Total Bilirubin: 0.5 mg/dL (ref 0.0–1.2)
Total Protein: 7.6 g/dL (ref 6.5–8.1)

## 2024-10-09 LAB — URINALYSIS, W/ REFLEX TO CULTURE (INFECTION SUSPECTED)
Bacteria, UA: NONE SEEN
Bilirubin Urine: NEGATIVE
Glucose, UA: NEGATIVE mg/dL
Ketones, ur: 5 mg/dL — AB
Leukocytes,Ua: NEGATIVE
Nitrite: NEGATIVE
Protein, ur: NEGATIVE mg/dL
Specific Gravity, Urine: 1.018 (ref 1.005–1.030)
pH: 5 (ref 5.0–8.0)

## 2024-10-09 LAB — LIPASE, BLOOD: Lipase: 25 U/L (ref 11–51)

## 2024-10-09 MED ORDER — ONDANSETRON HCL 4 MG/2ML IJ SOLN
4.0000 mg | Freq: Once | INTRAMUSCULAR | Status: AC
  Administered 2024-10-09: 4 mg via INTRAVENOUS
  Filled 2024-10-09: qty 2

## 2024-10-09 MED ORDER — KETOROLAC TROMETHAMINE 30 MG/ML IJ SOLN
15.0000 mg | Freq: Once | INTRAMUSCULAR | Status: AC
  Administered 2024-10-09: 15 mg via INTRAVENOUS
  Filled 2024-10-09: qty 1

## 2024-10-09 MED ORDER — ONDANSETRON 4 MG PO TBDP: 4.0000 mg | ORAL_TABLET | Freq: Four times a day (QID) | ORAL | 0 refills | Status: AC | PRN

## 2024-10-09 MED ORDER — NAPROXEN 500 MG PO TABS: 500.0000 mg | ORAL_TABLET | Freq: Two times a day (BID) | ORAL | 0 refills | Status: AC

## 2024-10-09 MED ORDER — SODIUM CHLORIDE 0.9 % IV BOLUS
500.0000 mL | Freq: Once | INTRAVENOUS | Status: AC
  Administered 2024-10-09: 500 mL via INTRAVENOUS

## 2024-10-09 MED ORDER — HYDROMORPHONE HCL 1 MG/ML IJ SOLN
1.0000 mg | Freq: Once | INTRAMUSCULAR | Status: AC
  Administered 2024-10-09: 1 mg via INTRAVENOUS
  Filled 2024-10-09: qty 1

## 2024-10-09 MED ORDER — METOCLOPRAMIDE HCL 5 MG/ML IJ SOLN
10.0000 mg | Freq: Once | INTRAMUSCULAR | Status: AC
  Administered 2024-10-09: 10 mg via INTRAVENOUS
  Filled 2024-10-09: qty 2

## 2024-10-09 MED ORDER — MORPHINE SULFATE (PF) 4 MG/ML IV SOLN
4.0000 mg | Freq: Once | INTRAVENOUS | Status: AC
  Administered 2024-10-09: 4 mg via INTRAVENOUS
  Filled 2024-10-09: qty 1

## 2024-10-09 NOTE — ED Notes (Signed)
 Upon going over discharge , pt vomited on floor, EDP made aware 4mg  Zofran  given

## 2024-10-09 NOTE — ED Provider Notes (Signed)
 " [place] [facility] DEPARTMENT AT [facility] Provider Note   CSN: [id number] Arrival date & time: 10/09/24  9181     Patient presents with: No chief complaint on file.   Katherine [name] is a 54 y.o. female.   Katherine [name] is a 54 y.o. female with a history of breast cancer in remission, uterine fibroids s/p hysterectomy, fibromyalgia, migraines and asthma, who presents to the emergency department for evaluation of left lower quadrant abdominal pain.  Pain started suddenly yesterday evening and has been constant and worsening since then.  Pain has never let up or gone away since it began.  She reports some associated nausea but no vomiting.  No associated diarrhea or constipation.  No dysuria, urinary frequency or hematuria.  Denies flank or back pain associated with pain.  No fevers or chills.  She does report history of previous ovarian cysts and reports this pain feels similar but more severe.  Had a hysterectomy several years ago but ovaries are retained.  No meds prior to arrival.  No other aggravating or alleviating factors.  The history is provided by the patient and medical records.       Prior to Admission medications  Medication Sig Start Date End Date Taking? Authorizing Provider  albuterol  (PROVENTIL  HFA;VENTOLIN  HFA) 108 (90 Base) MCG/ACT inhaler Inhale 2 puffs into the lungs every 6 (six) hours as needed for wheezing or shortness of breath.    [provider]  cetirizine  (ZYRTEC ) 10 MG tablet Take 10 mg by mouth daily. 10/06/22   [provider]  dicyclomine  (BENTYL ) 20 MG tablet Take 1 tablet (20 mg total) by mouth 2 (two) times daily. 03/07/24   Henderly, Britni A, PA-C  DULoxetine  (CYMBALTA ) 30 MG capsule Take 30 mg by mouth daily.    [provider]  fluticasone  (FLONASE ) 50 MCG/ACT nasal spray Place 1 spray into both nostrils as needed for allergies or rhinitis.    [provider]  hydrocortisone  cream 1 % Apply to affected area 2  times daily 02/17/21   Couture, Cortni S, PA-C  lidocaine  (LIDODERM ) 5 % Place 1 patch onto the skin daily. Remove & Discard patch within 12 hours or as directed by MD 01/21/24   Zelaya, Oscar A, PA-C  metoprolol  succinate (TOPROL -XL) 25 MG 24 hr tablet Take 1 tablet (25 mg total) by mouth 2 (two) times daily. This is your heart medication, it helps control your heart rate. 09/10/24   Leverne Charlies Macario, PA-C  metroNIDAZOLE  (FLAGYL ) 500 MG tablet Take 1 tablet (500 mg total) by mouth 2 (two) times daily. 01/16/22   Ward, Harlene PEDLAR, PA-C  omeprazole  (PRILOSEC) 40 MG capsule Take 40 mg by mouth every morning. 11/10/22   [provider]  ondansetron  (ZOFRAN -ODT) 4 MG disintegrating tablet Take 1 tablet (4 mg total) by mouth every 8 (eight) hours as needed. 03/07/24   Henderly, Britni A, PA-C  oxyCODONE -acetaminophen  (PERCOCET/ROXICET) 5-325 MG tablet Take 1 tablet by mouth every 6 (six) hours as needed for severe pain (pain score 7-10). 01/21/24   Zelaya, Oscar A, PA-C  promethazine  (PHENERGAN ) 25 MG tablet Take 1 tablet (25 mg total) by mouth every 6 (six) hours as needed for nausea or vomiting. 08/17/24   Iola Lukes, FNP  RESTASIS 0.05 % ophthalmic emulsion Place 1 drop into both eyes 2 (two) times daily. 08/16/22   [provider]  SYMBICORT 80-4.5 MCG/ACT inhaler Inhale 2 puffs into the lungs 3 (three) times daily. 11/14/19  [provider]    Allergies: Shellfish allergy , Tea, and Tomato    Review of Systems  Constitutional:  Negative for chills and fever.  Respiratory:  Negative for cough and shortness of breath.   Cardiovascular:  Negative for chest pain.  Gastrointestinal:  Positive for abdominal pain and nausea. Negative for diarrhea and vomiting.  Genitourinary:  Negative for dysuria, flank pain, frequency, hematuria, pelvic pain, vaginal bleeding and vaginal discharge.    Updated Vital Signs BP (!) 157/100 (BP Location: Right Arm)   Pulse (!) 59   Temp 98.4  F (36.9 C) (Oral)   Resp 17   Ht 5' 6 (1.676 m)   LMP 11/13/2016   SpO2 100%   BMI 24.21 kg/m   Physical Exam Vitals and nursing note reviewed.  Constitutional:      General: She is not in acute distress.    Appearance: Normal appearance. She is well-developed. She is not diaphoretic.  HENT:     Head: Normocephalic and atraumatic.  Eyes:     General:        Right eye: No discharge.        Left eye: No discharge.  Cardiovascular:     Rate and Rhythm: Normal rate and regular rhythm.     Pulses: Normal pulses.     Heart sounds: Normal heart sounds.  Pulmonary:     Effort: Pulmonary effort is normal. No respiratory distress.     Breath sounds: Normal breath sounds. No wheezing or rales.     Comments: Respirations equal and unlabored, patient able to speak in full sentences, lungs clear to auscultation bilaterally  Abdominal:     General: Bowel sounds are normal. There is no distension.     Palpations: Abdomen is soft. There is no mass.     Tenderness: There is abdominal tenderness. There is no guarding.     Comments: Abdomen soft, nondistended, bowel sounds present throughout, focal tenderness in the left lower quadrant with some mild left upper quadrant tenderness but all other quadrants nontender, no guarding or rebound tenderness, no CVA tenderness bilaterally.  Musculoskeletal:        General: No deformity.     Cervical back: Neck supple.  Skin:    General: Skin is warm and dry.     Capillary Refill: Capillary refill takes less than 2 seconds.  Neurological:     Mental Status: She is alert and oriented to person, place, and time.     Coordination: Coordination normal.     Comments: Speech is clear, able to follow commands Moves extremities without ataxia, coordination intact  Psychiatric:        Mood and Affect: Mood normal.        Behavior: Behavior normal.     (all labs ordered are listed, but only abnormal results are displayed) Labs Reviewed  CBC WITH  DIFFERENTIAL/PLATELET - Abnormal; Notable for the following components:      Result Value   RBC 5.25 (*)    All other components within normal limits  COMPREHENSIVE METABOLIC PANEL WITH GFR  LIPASE, BLOOD  CBC WITH DIFFERENTIAL/PLATELET  URINALYSIS, W/ REFLEX TO CULTURE (INFECTION SUSPECTED)    EKG: None  Radiology: US  PELVIC COMPLETE WITH TRANSVAGINAL Result Date: 10/09/2024 EXAM: US  Pelvis, Complete Transvaginal and Transabdominal without Doppler TECHNIQUE: Transabdominal and transvaginal pelvic duplex ultrasound using B-mode/gray scaled imaging without Doppler spectral analysis and color flow was obtained. COMPARISON: US  Pelvis 11/06/2014, 03/07/2024 CLINICAL HISTORY: LLQ abdominal pain. FINDINGS: UTERUS: Status post hysterectomy.  ENDOMETRIAL STRIPE: Not applicable. RIGHT OVARY: Not visualized, possibly due to overlying bowel gas. LEFT OVARY: Not visualized, possibly due to overlying bowel gas. FREE FLUID: No free fluid. IMPRESSION: 1. No acute abnormality visualized. Hysterectomy. 2. Neither ovary was visualized due to overlying bowel gas. Electronically signed by: Rogelia Myers MD 10/09/2024 10:29 AM EST RP Workstation: [id number]   CT Renal Stone Study Result Date: 10/09/2024 CLINICAL DATA:  Left flank pain beginning yesterday.  Nausea. EXAM: CT ABDOMEN AND PELVIS WITHOUT CONTRAST TECHNIQUE: Multidetector CT imaging of the abdomen and pelvis was performed following the standard protocol without IV contrast. RADIATION DOSE REDUCTION: This exam was performed according to the departmental dose-optimization program which includes automated exposure control, adjustment of the mA and/or kV according to patient size and/or use of iterative reconstruction technique. COMPARISON:  03/07/2024 FINDINGS: Lower chest: Heart is normal size.  Lung bases are clear. Hepatobiliary: Liver, gallbladder and biliary tree are within normal. Pancreas: Normal. Spleen: Normal. Adrenals/Urinary Tract: Adrenal glands  are normal. Kidneys are normal in size. No evidence of nephrolithiasis or hydronephrosis. Ureters and bladder are normal. Stomach/Bowel: Stomach and small bowel are normal. Appendix is normal. Colon is unremarkable. There is minimal fecal retention throughout the colon. Vascular/Lymphatic: Abdominal aorta is normal caliber. Remaining vascular structures are unremarkable on this noncontrast exam. No evidence of adenopathy. Reproductive: Status post hysterectomy. No adnexal masses. Other: No free fluid or focal inflammatory change. Musculoskeletal: Mild stable chronic anterior wedge compression deformity of L1. IMPRESSION: 1. No acute findings in the abdomen/pelvis. No evidence of nephrolithiasis or hydronephrosis. 2. Stable mild anterior wedge compression deformity at L1. Electronically Signed   By: Toribio Agreste M.D.   On: 10/09/2024 09:35     Procedures   Medications Ordered in the ED  morphine  (PF) 4 MG/ML injection 4 mg (4 mg Intravenous Given 10/09/24 1110)  ondansetron  (ZOFRAN ) injection 4 mg (4 mg Intravenous Given 10/09/24 1112)  sodium chloride  0.9 % bolus 500 mL (0 mLs Intravenous Stopped 10/09/24 1242)  HYDROmorphone  (DILAUDID ) injection 1 mg (1 mg Intravenous Given 10/09/24 1245)  ketorolac  (TORADOL ) 30 MG/ML injection 15 mg (15 mg Intravenous Given 10/09/24 1245)  ondansetron  (ZOFRAN ) injection 4 mg (4 mg Intravenous Given 10/09/24 1347)  metoCLOPramide  (REGLAN ) injection 10 mg (10 mg Intravenous Given 10/09/24 1446)                                    Medical Decision Making Amount and/or Complexity of Data Reviewed Labs: ordered. Radiology: ordered.  Risk Prescription drug management.   Patient presents to the ED with complaints of abdominal pain. Patient nontoxic appearing, in no apparent distress, vitals significant for hypertension with systolic blood pressure in the 150-160s, pulse in the upper 50s, otherwise normal. On exam patient tender to to palpation in the left lower  quadrant, no peritoneal signs. Will evaluate with labs and CT renal stone study and pelvic ultrasound.   Ddx including but not limited to: Ovarian cyst, ovarian torsion, renal stone, diverticulitis, obstruction, perforation, considered dissection but given patient's exam and pain location and quality feel this is less likely.  Additional history obtained:  Additional history obtained from chart review & nursing note review.   Lab Tests:  I Ordered, viewed, and interpreted labs, which included:  CBC: No leukocytosis and normal hemoglobin CMP: No significant electrolyte derangements, normal renal and liver function Lipase: WNL UA: No hematuria or signs of infection   Imaging  Studies ordered:  I ordered imaging studies which included pelvic ultrasound and CT renal stone study, I independently reviewed, formal radiology impression shows:  No evidence of renal stone or other acute intra-abdominal pathology, no adnexal masses noted on CT, ultrasound was technically challenging due to bowel gas and they were unable to visualize ovaries.  ED Course:  I ordered IV morphine  and Zofran  initially without much improvement in pain, patient then received IV Dilaudid  and Toradol  with significant improvement.    On repeat abdominal exam patient remains without peritoneal signs, low suspicion for cholecystitis, pancreatitis, diverticulitis, appendicitis, bowel obstruction/perforation,  PID, ectopic pregnancy, or other acute surgical process. Will discharge home with supportive measures. I discussed results, treatment plan, need for PCP follow-up, and return precautions with the patient. Provided opportunity for questions, patient confirmed understanding and is in agreement with plan.   Upon receiving discharge paperwork patient immediately threw up on the floor.  Reported suddenly feeling more nauseated.  Patient received Zofran  and while she has not had additional vomiting she still feeling nauseous, given  additional dose of Reglan  and on reevaluation patient has not vomited again and would like to go home.  Sent with prescription for Zofran  at home.  Portions of this note were generated with Scientist, clinical (histocompatibility and immunogenetics). Dictation errors may occur despite best attempts at proofreading.        Final diagnoses:  Left lower quadrant pain  Nausea and vomiting, unspecified vomiting type    ED Discharge Orders          Ordered    naproxen  (NAPROSYN ) 500 MG tablet  2 times daily        10/09/24 1327    ondansetron  (ZOFRAN -ODT) 4 MG disintegrating tablet  Every 6 hours PRN        10/09/24 1504               Alva Larraine FALCON, PA-C 10/09/24 1540    Dasie Faden, MD 10/12/24 [phone]  "

## 2024-10-09 NOTE — ED Triage Notes (Signed)
 C/O left flank pain that started yesterday. C/O nausea. Denies vomiting/diarrhea. Axox4. Denies urinary issues.

## 2024-10-09 NOTE — Discharge Instructions (Addendum)
 Your CT scan, ultrasound and lab work were overall reassuring.  While your ultrasound could not easily visualize your ovaries CT scan did not show any obvious masses or cyst on your ovary and did not show signs of a kidney stone or diverticulitis.  Your lab work is overall reassuring.  I encourage you to use naproxen  twice daily to help with pain you can take Tylenol  in addition to this but do not combine with any other over-the-counter pain medications.  Follow-up closely with your primary care doctor or OB/GYN if symptoms are not improving if you develop significantly worsened or new abdominal pain or other new or concerning symptoms return for reevaluation.
